# Patient Record
Sex: Female | Born: 1940 | Race: White | Hispanic: No | Smoking: Former smoker
Health system: Southern US, Community
[De-identification: ages and names within clinical notes are randomized; demographics above are authoritative.]

## PROBLEM LIST (undated history)

## (undated) DIAGNOSIS — K589 Irritable bowel syndrome without diarrhea: Secondary | ICD-10-CM

## (undated) DIAGNOSIS — R079 Chest pain, unspecified: Secondary | ICD-10-CM

## (undated) DIAGNOSIS — I2510 Atherosclerotic heart disease of native coronary artery without angina pectoris: Secondary | ICD-10-CM

## (undated) DIAGNOSIS — N189 Chronic kidney disease, unspecified: Secondary | ICD-10-CM

## (undated) DIAGNOSIS — R519 Headache, unspecified: Secondary | ICD-10-CM

## (undated) DIAGNOSIS — D863 Sarcoidosis of skin: Secondary | ICD-10-CM

## (undated) DIAGNOSIS — D509 Iron deficiency anemia, unspecified: Secondary | ICD-10-CM

## (undated) DIAGNOSIS — I219 Acute myocardial infarction, unspecified: Secondary | ICD-10-CM

## (undated) DIAGNOSIS — D519 Vitamin B12 deficiency anemia, unspecified: Secondary | ICD-10-CM

## (undated) DIAGNOSIS — K649 Unspecified hemorrhoids: Secondary | ICD-10-CM

## (undated) DIAGNOSIS — E1140 Type 2 diabetes mellitus with diabetic neuropathy, unspecified: Secondary | ICD-10-CM

## (undated) DIAGNOSIS — K635 Polyp of colon: Secondary | ICD-10-CM

## (undated) DIAGNOSIS — E119 Type 2 diabetes mellitus without complications: Secondary | ICD-10-CM

## (undated) DIAGNOSIS — M5090 Cervical disc disorder, unspecified, unspecified cervical region: Secondary | ICD-10-CM

## (undated) DIAGNOSIS — J189 Pneumonia, unspecified organism: Secondary | ICD-10-CM

## (undated) DIAGNOSIS — E785 Hyperlipidemia, unspecified: Secondary | ICD-10-CM

## (undated) DIAGNOSIS — I209 Angina pectoris, unspecified: Secondary | ICD-10-CM

## (undated) DIAGNOSIS — M1990 Unspecified osteoarthritis, unspecified site: Secondary | ICD-10-CM

## (undated) DIAGNOSIS — Z8639 Personal history of other endocrine, nutritional and metabolic disease: Secondary | ICD-10-CM

## (undated) DIAGNOSIS — K219 Gastro-esophageal reflux disease without esophagitis: Secondary | ICD-10-CM

## (undated) DIAGNOSIS — K8020 Calculus of gallbladder without cholecystitis without obstruction: Secondary | ICD-10-CM

## (undated) DIAGNOSIS — G4730 Sleep apnea, unspecified: Secondary | ICD-10-CM

## (undated) DIAGNOSIS — I10 Essential (primary) hypertension: Secondary | ICD-10-CM

## (undated) HISTORY — PX: ABDOMINAL HYSTERECTOMY: SHX81

## (undated) HISTORY — PX: ESOPHAGOGASTRODUODENOSCOPY: SHX1529

## (undated) HISTORY — PX: OOPHORECTOMY: SHX86

## (undated) HISTORY — PX: BACK SURGERY: SHX140

## (undated) HISTORY — PX: EXCISION OF ADNEXAL MASS: SHX5820

## (undated) HISTORY — PX: CORONARY ARTERY BYPASS GRAFT: SHX141

## (undated) HISTORY — PX: JOINT REPLACEMENT: SHX530

## (undated) HISTORY — PX: CARDIAC CATHETERIZATION: SHX172

## (undated) HISTORY — PX: CORONARY ANGIOPLASTY: SHX604

## (undated) HISTORY — PX: COLONOSCOPY: SHX174

## (undated) HISTORY — PX: OTHER SURGICAL HISTORY: SHX169

## (undated) HISTORY — PX: CHOLECYSTECTOMY: SHX55

---

## 2000-06-08 ENCOUNTER — Encounter: Payer: Self-pay | Admitting: Neurosurgery

## 2000-06-10 ENCOUNTER — Encounter: Payer: Self-pay | Admitting: Neurosurgery

## 2000-06-10 ENCOUNTER — Inpatient Hospital Stay (HOSPITAL_COMMUNITY): Admission: AD | Admit: 2000-06-10 | Discharge: 2000-06-13 | Payer: Self-pay | Admitting: Neurosurgery

## 2000-07-20 ENCOUNTER — Encounter: Payer: Self-pay | Admitting: Neurosurgery

## 2000-07-20 ENCOUNTER — Encounter: Admission: RE | Admit: 2000-07-20 | Discharge: 2000-07-20 | Payer: Self-pay | Admitting: Neurosurgery

## 2000-08-18 ENCOUNTER — Encounter: Admission: RE | Admit: 2000-08-18 | Discharge: 2000-08-18 | Payer: Self-pay | Admitting: Neurosurgery

## 2000-08-18 ENCOUNTER — Encounter: Payer: Self-pay | Admitting: Neurosurgery

## 2000-09-06 ENCOUNTER — Ambulatory Visit (HOSPITAL_COMMUNITY): Admission: RE | Admit: 2000-09-06 | Discharge: 2000-09-06 | Payer: Self-pay | Admitting: Neurosurgery

## 2000-09-06 ENCOUNTER — Encounter: Payer: Self-pay | Admitting: Neurosurgery

## 2000-11-23 ENCOUNTER — Encounter: Payer: Self-pay | Admitting: Neurosurgery

## 2000-11-23 ENCOUNTER — Encounter: Admission: RE | Admit: 2000-11-23 | Discharge: 2000-11-23 | Payer: Self-pay | Admitting: Neurosurgery

## 2001-06-07 ENCOUNTER — Encounter: Payer: Self-pay | Admitting: Neurosurgery

## 2001-06-07 ENCOUNTER — Encounter: Admission: RE | Admit: 2001-06-07 | Discharge: 2001-06-07 | Payer: Self-pay | Admitting: Neurosurgery

## 2002-07-11 ENCOUNTER — Encounter: Admission: RE | Admit: 2002-07-11 | Discharge: 2002-07-11 | Payer: Self-pay | Admitting: Orthopedic Surgery

## 2002-07-11 ENCOUNTER — Encounter: Payer: Self-pay | Admitting: Orthopedic Surgery

## 2003-11-17 ENCOUNTER — Encounter: Admission: RE | Admit: 2003-11-17 | Discharge: 2003-11-17 | Payer: Self-pay | Admitting: Unknown Physician Specialty

## 2004-03-05 ENCOUNTER — Ambulatory Visit: Payer: Self-pay | Admitting: Unknown Physician Specialty

## 2005-04-08 ENCOUNTER — Ambulatory Visit: Payer: Self-pay | Admitting: Internal Medicine

## 2005-08-06 ENCOUNTER — Ambulatory Visit: Payer: Self-pay | Admitting: Cardiology

## 2005-08-25 ENCOUNTER — Other Ambulatory Visit: Payer: Self-pay

## 2005-08-25 ENCOUNTER — Emergency Department: Payer: Self-pay | Admitting: Emergency Medicine

## 2005-08-27 ENCOUNTER — Emergency Department: Payer: Self-pay | Admitting: Emergency Medicine

## 2005-08-28 ENCOUNTER — Other Ambulatory Visit: Payer: Self-pay

## 2005-08-28 ENCOUNTER — Inpatient Hospital Stay: Payer: Self-pay | Admitting: Internal Medicine

## 2005-11-27 ENCOUNTER — Ambulatory Visit: Payer: Self-pay | Admitting: Gastroenterology

## 2006-04-13 ENCOUNTER — Other Ambulatory Visit: Payer: Self-pay

## 2006-04-20 ENCOUNTER — Inpatient Hospital Stay: Payer: Self-pay | Admitting: Unknown Physician Specialty

## 2006-09-06 ENCOUNTER — Ambulatory Visit: Payer: Self-pay | Admitting: Physician Assistant

## 2007-01-28 ENCOUNTER — Ambulatory Visit: Payer: Self-pay | Admitting: Cardiology

## 2007-02-04 ENCOUNTER — Ambulatory Visit: Payer: Self-pay | Admitting: Cardiology

## 2007-04-08 ENCOUNTER — Ambulatory Visit: Payer: Self-pay | Admitting: Internal Medicine

## 2008-01-04 ENCOUNTER — Ambulatory Visit: Payer: Self-pay | Admitting: Gastroenterology

## 2008-05-22 ENCOUNTER — Ambulatory Visit: Payer: Self-pay | Admitting: Internal Medicine

## 2009-05-30 ENCOUNTER — Ambulatory Visit: Payer: Self-pay | Admitting: Internal Medicine

## 2010-03-19 ENCOUNTER — Ambulatory Visit: Payer: Self-pay | Admitting: Internal Medicine

## 2010-03-30 ENCOUNTER — Ambulatory Visit: Payer: Self-pay | Admitting: Internal Medicine

## 2010-06-02 ENCOUNTER — Ambulatory Visit: Payer: Self-pay | Admitting: Internal Medicine

## 2010-07-01 ENCOUNTER — Ambulatory Visit: Payer: Self-pay | Admitting: Unknown Physician Specialty

## 2010-07-03 ENCOUNTER — Emergency Department: Payer: Self-pay

## 2010-07-18 ENCOUNTER — Ambulatory Visit: Payer: Self-pay | Admitting: Unknown Physician Specialty

## 2011-01-27 ENCOUNTER — Encounter: Payer: Self-pay | Admitting: Internal Medicine

## 2011-01-28 ENCOUNTER — Ambulatory Visit: Payer: Self-pay | Admitting: Internal Medicine

## 2011-02-09 ENCOUNTER — Encounter: Payer: Self-pay | Admitting: Internal Medicine

## 2011-03-12 ENCOUNTER — Encounter: Payer: Self-pay | Admitting: Internal Medicine

## 2011-04-11 ENCOUNTER — Encounter: Payer: Self-pay | Admitting: Internal Medicine

## 2011-05-12 ENCOUNTER — Encounter: Payer: Self-pay | Admitting: Internal Medicine

## 2011-06-30 ENCOUNTER — Ambulatory Visit: Payer: Self-pay | Admitting: Internal Medicine

## 2011-11-19 ENCOUNTER — Ambulatory Visit: Payer: Self-pay | Admitting: Podiatry

## 2012-06-01 ENCOUNTER — Ambulatory Visit: Payer: Self-pay | Admitting: Gastroenterology

## 2012-07-07 ENCOUNTER — Ambulatory Visit: Payer: Self-pay | Admitting: Cardiothoracic Surgery

## 2012-07-09 ENCOUNTER — Ambulatory Visit: Payer: Self-pay | Admitting: Cardiothoracic Surgery

## 2012-07-13 ENCOUNTER — Ambulatory Visit: Payer: Self-pay | Admitting: Internal Medicine

## 2013-01-04 ENCOUNTER — Ambulatory Visit: Payer: Self-pay | Admitting: Cardiothoracic Surgery

## 2013-01-05 ENCOUNTER — Ambulatory Visit: Payer: Self-pay | Admitting: Cardiothoracic Surgery

## 2013-05-25 ENCOUNTER — Ambulatory Visit: Payer: Self-pay | Admitting: Cardiothoracic Surgery

## 2013-06-11 ENCOUNTER — Ambulatory Visit: Payer: Self-pay | Admitting: Cardiothoracic Surgery

## 2013-07-20 ENCOUNTER — Ambulatory Visit: Payer: Self-pay | Admitting: Internal Medicine

## 2013-08-05 DIAGNOSIS — M5137 Other intervertebral disc degeneration, lumbosacral region: Secondary | ICD-10-CM | POA: Insufficient documentation

## 2013-08-05 DIAGNOSIS — R519 Headache, unspecified: Secondary | ICD-10-CM | POA: Insufficient documentation

## 2013-08-05 DIAGNOSIS — M51379 Other intervertebral disc degeneration, lumbosacral region without mention of lumbar back pain or lower extremity pain: Secondary | ICD-10-CM | POA: Insufficient documentation

## 2013-08-05 DIAGNOSIS — I25810 Atherosclerosis of coronary artery bypass graft(s) without angina pectoris: Secondary | ICD-10-CM | POA: Insufficient documentation

## 2013-11-28 DIAGNOSIS — E279 Disorder of adrenal gland, unspecified: Secondary | ICD-10-CM | POA: Insufficient documentation

## 2013-11-28 DIAGNOSIS — E538 Deficiency of other specified B group vitamins: Secondary | ICD-10-CM | POA: Insufficient documentation

## 2013-11-28 DIAGNOSIS — E278 Other specified disorders of adrenal gland: Secondary | ICD-10-CM | POA: Insufficient documentation

## 2014-03-04 DIAGNOSIS — E1122 Type 2 diabetes mellitus with diabetic chronic kidney disease: Secondary | ICD-10-CM | POA: Insufficient documentation

## 2014-04-09 DIAGNOSIS — E1169 Type 2 diabetes mellitus with other specified complication: Secondary | ICD-10-CM | POA: Insufficient documentation

## 2014-04-09 DIAGNOSIS — E785 Hyperlipidemia, unspecified: Secondary | ICD-10-CM | POA: Insufficient documentation

## 2014-07-26 ENCOUNTER — Ambulatory Visit: Payer: Self-pay | Admitting: Internal Medicine

## 2014-10-09 ENCOUNTER — Other Ambulatory Visit: Payer: Self-pay | Admitting: Nurse Practitioner

## 2014-10-09 DIAGNOSIS — K2100 Gastro-esophageal reflux disease with esophagitis, without bleeding: Secondary | ICD-10-CM

## 2014-10-09 DIAGNOSIS — K3184 Gastroparesis: Secondary | ICD-10-CM

## 2014-10-09 DIAGNOSIS — K449 Diaphragmatic hernia without obstruction or gangrene: Secondary | ICD-10-CM

## 2014-10-09 DIAGNOSIS — R1013 Epigastric pain: Secondary | ICD-10-CM

## 2014-10-17 ENCOUNTER — Ambulatory Visit
Admission: RE | Admit: 2014-10-17 | Discharge: 2014-10-17 | Disposition: A | Discharge status: Home / Self Care | Payer: Medicare Other | Source: Ambulatory Visit | Attending: Nurse Practitioner | Admitting: Nurse Practitioner

## 2014-10-17 DIAGNOSIS — K219 Gastro-esophageal reflux disease without esophagitis: Secondary | ICD-10-CM | POA: Insufficient documentation

## 2014-10-17 DIAGNOSIS — K2100 Gastro-esophageal reflux disease with esophagitis, without bleeding: Secondary | ICD-10-CM

## 2014-10-17 DIAGNOSIS — K449 Diaphragmatic hernia without obstruction or gangrene: Secondary | ICD-10-CM | POA: Insufficient documentation

## 2014-10-17 DIAGNOSIS — K3184 Gastroparesis: Secondary | ICD-10-CM

## 2014-10-17 DIAGNOSIS — R1013 Epigastric pain: Secondary | ICD-10-CM

## 2015-07-02 ENCOUNTER — Other Ambulatory Visit: Payer: Self-pay | Admitting: Obstetrics and Gynecology

## 2015-07-02 DIAGNOSIS — Z1231 Encounter for screening mammogram for malignant neoplasm of breast: Secondary | ICD-10-CM

## 2015-07-30 ENCOUNTER — Ambulatory Visit: Payer: Medicare Other

## 2015-08-01 ENCOUNTER — Ambulatory Visit
Admission: RE | Admit: 2015-08-01 | Discharge: 2015-08-01 | Disposition: A | Discharge status: Home / Self Care | Payer: Medicare Other | Source: Ambulatory Visit | Attending: Obstetrics and Gynecology | Admitting: Obstetrics and Gynecology

## 2015-08-01 ENCOUNTER — Other Ambulatory Visit: Payer: Self-pay | Admitting: Obstetrics and Gynecology

## 2015-08-01 DIAGNOSIS — Z1231 Encounter for screening mammogram for malignant neoplasm of breast: Secondary | ICD-10-CM | POA: Diagnosis not present

## 2016-06-16 ENCOUNTER — Other Ambulatory Visit: Payer: Self-pay | Admitting: Internal Medicine

## 2016-06-16 DIAGNOSIS — Z1231 Encounter for screening mammogram for malignant neoplasm of breast: Secondary | ICD-10-CM

## 2016-08-06 ENCOUNTER — Ambulatory Visit
Admission: RE | Admit: 2016-08-06 | Discharge: 2016-08-06 | Disposition: A | Discharge status: Home / Self Care | Payer: Medicare Other | Source: Ambulatory Visit | Attending: Internal Medicine | Admitting: Internal Medicine

## 2016-08-06 DIAGNOSIS — Z1231 Encounter for screening mammogram for malignant neoplasm of breast: Secondary | ICD-10-CM

## 2017-05-26 ENCOUNTER — Other Ambulatory Visit: Payer: Self-pay | Admitting: Cardiology

## 2017-05-26 ENCOUNTER — Ambulatory Visit
Admission: RE | Admit: 2017-05-26 | Discharge: 2017-05-26 | Disposition: A | Discharge status: Home / Self Care | Payer: Medicare Other | Source: Ambulatory Visit | Attending: Cardiology | Admitting: Cardiology

## 2017-05-26 DIAGNOSIS — M79604 Pain in right leg: Secondary | ICD-10-CM | POA: Insufficient documentation

## 2017-07-26 ENCOUNTER — Other Ambulatory Visit: Payer: Self-pay | Admitting: Internal Medicine

## 2017-07-26 DIAGNOSIS — Z1231 Encounter for screening mammogram for malignant neoplasm of breast: Secondary | ICD-10-CM

## 2017-09-14 ENCOUNTER — Ambulatory Visit
Admission: RE | Admit: 2017-09-14 | Discharge: 2017-09-14 | Disposition: A | Discharge status: Home / Self Care | Payer: Medicare Other | Source: Ambulatory Visit | Attending: Internal Medicine | Admitting: Internal Medicine

## 2017-09-14 DIAGNOSIS — Z1231 Encounter for screening mammogram for malignant neoplasm of breast: Secondary | ICD-10-CM | POA: Diagnosis not present

## 2017-11-26 ENCOUNTER — Encounter: Payer: Self-pay | Admitting: *Deleted

## 2017-11-29 ENCOUNTER — Ambulatory Visit: Payer: Medicare Other | Admitting: Certified Registered Nurse Anesthetist

## 2017-11-29 ENCOUNTER — Encounter: Payer: Self-pay | Admitting: Certified Registered Nurse Anesthetist

## 2017-11-29 ENCOUNTER — Ambulatory Visit
Admission: RE | Admit: 2017-11-29 | Discharge: 2017-11-29 | Disposition: A | Discharge status: Home / Self Care | Payer: Medicare Other | Source: Ambulatory Visit | Attending: Gastroenterology | Admitting: Gastroenterology

## 2017-11-29 ENCOUNTER — Encounter
Admission: RE | Disposition: A | Discharge status: Home / Self Care | Payer: Self-pay | Source: Ambulatory Visit | Attending: Gastroenterology

## 2017-11-29 DIAGNOSIS — K219 Gastro-esophageal reflux disease without esophagitis: Secondary | ICD-10-CM | POA: Insufficient documentation

## 2017-11-29 DIAGNOSIS — E119 Type 2 diabetes mellitus without complications: Secondary | ICD-10-CM | POA: Diagnosis not present

## 2017-11-29 DIAGNOSIS — Z881 Allergy status to other antibiotic agents status: Secondary | ICD-10-CM | POA: Insufficient documentation

## 2017-11-29 DIAGNOSIS — K259 Gastric ulcer, unspecified as acute or chronic, without hemorrhage or perforation: Secondary | ICD-10-CM | POA: Diagnosis not present

## 2017-11-29 DIAGNOSIS — D509 Iron deficiency anemia, unspecified: Secondary | ICD-10-CM | POA: Diagnosis present

## 2017-11-29 DIAGNOSIS — M1990 Unspecified osteoarthritis, unspecified site: Secondary | ICD-10-CM | POA: Diagnosis not present

## 2017-11-29 DIAGNOSIS — G4730 Sleep apnea, unspecified: Secondary | ICD-10-CM | POA: Insufficient documentation

## 2017-11-29 DIAGNOSIS — K589 Irritable bowel syndrome without diarrhea: Secondary | ICD-10-CM | POA: Diagnosis not present

## 2017-11-29 DIAGNOSIS — K2950 Unspecified chronic gastritis without bleeding: Secondary | ICD-10-CM | POA: Diagnosis not present

## 2017-11-29 DIAGNOSIS — Z7982 Long term (current) use of aspirin: Secondary | ICD-10-CM | POA: Diagnosis not present

## 2017-11-29 DIAGNOSIS — Z882 Allergy status to sulfonamides status: Secondary | ICD-10-CM | POA: Diagnosis not present

## 2017-11-29 DIAGNOSIS — Z888 Allergy status to other drugs, medicaments and biological substances status: Secondary | ICD-10-CM | POA: Diagnosis not present

## 2017-11-29 DIAGNOSIS — I2510 Atherosclerotic heart disease of native coronary artery without angina pectoris: Secondary | ICD-10-CM | POA: Diagnosis not present

## 2017-11-29 DIAGNOSIS — Z794 Long term (current) use of insulin: Secondary | ICD-10-CM | POA: Diagnosis not present

## 2017-11-29 DIAGNOSIS — Z885 Allergy status to narcotic agent status: Secondary | ICD-10-CM | POA: Insufficient documentation

## 2017-11-29 DIAGNOSIS — K317 Polyp of stomach and duodenum: Secondary | ICD-10-CM | POA: Diagnosis not present

## 2017-11-29 DIAGNOSIS — I252 Old myocardial infarction: Secondary | ICD-10-CM | POA: Insufficient documentation

## 2017-11-29 DIAGNOSIS — Z79899 Other long term (current) drug therapy: Secondary | ICD-10-CM | POA: Insufficient documentation

## 2017-11-29 HISTORY — DX: Essential (primary) hypertension: I10

## 2017-11-29 HISTORY — DX: Calculus of gallbladder without cholecystitis without obstruction: K80.20

## 2017-11-29 HISTORY — DX: Type 2 diabetes mellitus without complications: E11.9

## 2017-11-29 HISTORY — DX: Unspecified hemorrhoids: K64.9

## 2017-11-29 HISTORY — DX: Gastro-esophageal reflux disease without esophagitis: K21.9

## 2017-11-29 HISTORY — DX: Cervical disc disorder, unspecified, unspecified cervical region: M50.90

## 2017-11-29 HISTORY — DX: Chest pain, unspecified: R07.9

## 2017-11-29 HISTORY — DX: Sleep apnea, unspecified: G47.30

## 2017-11-29 HISTORY — DX: Atherosclerotic heart disease of native coronary artery without angina pectoris: I25.10

## 2017-11-29 HISTORY — DX: Unspecified osteoarthritis, unspecified site: M19.90

## 2017-11-29 HISTORY — DX: Acute myocardial infarction, unspecified: I21.9

## 2017-11-29 HISTORY — PX: COLONOSCOPY WITH PROPOFOL: SHX5780

## 2017-11-29 HISTORY — DX: Irritable bowel syndrome, unspecified: K58.9

## 2017-11-29 HISTORY — PX: ESOPHAGOGASTRODUODENOSCOPY (EGD) WITH PROPOFOL: SHX5813

## 2017-11-29 LAB — GLUCOSE, CAPILLARY: GLUCOSE-CAPILLARY: 180 mg/dL — AB (ref 70–99)

## 2017-11-29 SURGERY — COLONOSCOPY WITH PROPOFOL
Anesthesia: General

## 2017-11-29 MED ORDER — PROPOFOL 500 MG/50ML IV EMUL
INTRAVENOUS | Status: AC
  Filled 2017-11-29: qty 50

## 2017-11-29 MED ORDER — LIDOCAINE HCL (CARDIAC) PF 100 MG/5ML IV SOSY
PREFILLED_SYRINGE | INTRAVENOUS | Status: DC | PRN
  Administered 2017-11-29: 50 mg via INTRAVENOUS

## 2017-11-29 MED ORDER — LIDOCAINE HCL (PF) 2 % IJ SOLN
INTRAMUSCULAR | Status: AC
  Filled 2017-11-29: qty 10

## 2017-11-29 MED ORDER — ONDANSETRON HCL 4 MG/2ML IJ SOLN
4.0000 mg | Freq: Once | INTRAMUSCULAR | Status: AC | PRN
  Administered 2017-11-29: 4 mg via INTRAVENOUS

## 2017-11-29 MED ORDER — SODIUM CHLORIDE 0.9 % IV SOLN: INTRAVENOUS | Status: DC

## 2017-11-29 MED ORDER — FENTANYL CITRATE (PF) 100 MCG/2ML IJ SOLN: 25.0000 ug | INTRAMUSCULAR | Status: DC | PRN

## 2017-11-29 MED ORDER — ONDANSETRON HCL 4 MG/2ML IJ SOLN
INTRAMUSCULAR | Status: AC
  Filled 2017-11-29: qty 2

## 2017-11-29 MED ORDER — SODIUM CHLORIDE 0.9 % IV SOLN
INTRAVENOUS | Status: DC
  Administered 2017-11-29: 08:00:00 via INTRAVENOUS

## 2017-11-29 MED ORDER — PROPOFOL 500 MG/50ML IV EMUL
INTRAVENOUS | Status: DC | PRN
  Administered 2017-11-29: 140 ug/kg/min via INTRAVENOUS

## 2017-11-29 MED ORDER — PROPOFOL 10 MG/ML IV BOLUS
INTRAVENOUS | Status: DC | PRN
  Administered 2017-11-29: 70 mg via INTRAVENOUS

## 2017-11-29 NOTE — Anesthesia Post-op Follow-up Note (Signed)
Anesthesia QCDR form completed.        

## 2017-11-29 NOTE — Anesthesia Preprocedure Evaluation (Signed)
Anesthesia Evaluation  Patient identified by MRN, date of birth, ID band Patient awake    Airway Mallampati: II  TM Distance: >3 FB     Dental   Pulmonary sleep apnea , former smoker,    Pulmonary exam normal        Cardiovascular hypertension, + CAD and + Past MI  Normal cardiovascular exam     Neuro/Psych negative neurological ROS  negative psych ROS   GI/Hepatic Neg liver ROS, GERD  ,  Endo/Other  diabetes  Renal/GU negative Renal ROS  negative genitourinary   Musculoskeletal  (+) Arthritis , Osteoarthritis,    Abdominal Normal abdominal exam  (+)   Peds negative pediatric ROS (+)  Hematology negative hematology ROS (+)   Anesthesia Other Findings   Reproductive/Obstetrics                             Anesthesia Physical Anesthesia Plan  ASA: III  Anesthesia Plan: General   Post-op Pain Management:    Induction: Intravenous  PONV Risk Score and Plan: TIVA  Airway Management Planned:   Additional Equipment:   Intra-op Plan:   Post-operative Plan:   Informed Consent: I have reviewed the patients History and Physical, chart, labs and discussed the procedure including the risks, benefits and alternatives for the proposed anesthesia with the patient or authorized representative who has indicated his/her understanding and acceptance.     Plan Discussed with: CRNA and Surgeon  Anesthesia Plan Comments:         Anesthesia Quick Evaluation

## 2017-11-29 NOTE — Transfer of Care (Signed)
Immediate Anesthesia Transfer of Care Note  Patient: Rhonda Tyler  Procedure(s) Performed: COLONOSCOPY WITH PROPOFOL (N/A ) ESOPHAGOGASTRODUODENOSCOPY (EGD) WITH PROPOFOL (N/A )  Patient Location: PACU and Endoscopy Unit  Anesthesia Type:General  Level of Consciousness: drowsy  Airway & Oxygen Therapy: Patient Spontanous Breathing and Patient connected to face mask oxygen  Post-op Assessment: Report given to RN and Post -op Vital signs reviewed and stable  Post vital signs: Reviewed and stable  Last Vitals:  Vitals Value Taken Time  BP 110/48 11/29/2017  9:54 AM  Temp    Pulse 71 11/29/2017  9:54 AM  Resp 17 11/29/2017  9:54 AM  SpO2 98 % 11/29/2017  9:54 AM  Vitals shown include unvalidated device data.  Last Pain:  Vitals:   11/29/17 0952  TempSrc: (P) Tympanic  PainSc:          Complications: No apparent anesthesia complications

## 2017-11-29 NOTE — Op Note (Signed)
[facility] Gastroenterology Patient Name: Rhonda Tyler Procedure Date: 11/29/2017 8:20 AM MRN: [id number] Account #: [id number] Date of Birth: 04-06-1941 Admit Type: Outpatient Age: 76 Room: [facility] ENDO ROOM 3 Gender: Female Note Status: Finalized Procedure:            Colonoscopy Indications:          Iron deficiency anemia Providers:            Lollie Sails, MD Referring MD:         Ocie Cornfield. Ouida Sills MD, MD (Referring MD) Medicines:            Monitored Anesthesia Care Complications:        No immediate complications. Procedure:            Pre-Anesthesia Assessment:                       - ASA Grade Assessment: III - A patient with severe                        systemic disease.                       After obtaining informed consent, the colonoscope was                        passed under direct vision. Throughout the procedure,                        the patient's blood pressure, pulse, and oxygen                        saturations were monitored continuously. The                        Colonoscope was introduced through the anus and                        advanced to the the cecum, identified by appendiceal                        orifice and ileocecal valve. The colonoscopy was                        performed without difficulty. The patient tolerated the                        procedure well. The quality of the bowel preparation                        was good. Findings:      The colon (entire examined portion) appeared normal.      The retroflexed view of the distal rectum and anal verge was normal and       showed no anal or rectal abnormalities.      The digital rectal exam was normal. Pertinent negatives include Note       some mild masceration of the skin of the perianal region. Impression:           - The entire examined colon is normal.                       -  No specimens collected. Recommendation:       - Desitin ointment to affected  perianal areas as                        directed. Procedure Code(s):    --- Professional ---                       [phone], Colonoscopy, flexible; diagnostic, including                        collection of specimen(s) by brushing or washing, when                        performed (separate procedure) Diagnosis Code(s):    --- Professional ---                       D50.9, Iron deficiency anemia, unspecified CPT copyright 2017 American Medical Association. All rights reserved. The codes documented in this report are preliminary and upon coder review may  be revised to meet current compliance requirements. Lollie Sails, MD 11/29/2017 9:48:28 AM This report has been signed electronically. Number of Addenda: 0 Note Initiated On: 11/29/2017 8:20 AM Scope Withdrawal Time: 0 hours 3 minutes 48 seconds  Total Procedure Duration: 0 hours 9 minutes 15 seconds       [facility]

## 2017-11-29 NOTE — Op Note (Signed)
[facility] Gastroenterology Patient Name: Rhonda Tyler Procedure Date: 11/29/2017 8:21 AM MRN: [id number] Account #: [id number] Date of Birth: 11-25-1940 Admit Type: Outpatient Age: 77 Room: [facility] ENDO ROOM 3 Gender: Female Note Status: Finalized Procedure:            Upper GI endoscopy Indications:          Epigastric abdominal pain, Iron deficiency anemia Providers:            Lollie Sails, MD Referring MD:         Ocie Cornfield. Ouida Sills MD, MD (Referring MD) Medicines:            Monitored Anesthesia Care Complications:        No immediate complications. Procedure:            Pre-Anesthesia Assessment:                       - ASA Grade Assessment: III - A patient with severe                        systemic disease.                       After obtaining informed consent, the endoscope was                        passed under direct vision. Throughout the procedure,                        the patient's blood pressure, pulse, and oxygen                        saturations were monitored continuously. The Endoscope                        was introduced through the mouth, and advanced to the                        third part of duodenum. The upper GI endoscopy was                        performed with moderate difficulty. The patient                        tolerated the procedure well. Findings:      The examined esophagus was normal.      Multiple 1 to 28/30 mm pedunculated and sessile polyps with no bleeding       and stigmata of recent bleeding were found in the gastric body and in       the gastric antrum. The polyp was removed with a hot snare. The multiple       segments of the detached polyp was removed with a piecemeal technique       using a cold snare and suction. Resection and retrieval were complete.       There are multiple smallter polyps that are erythematous without       stigmata. To prevent bleeding after the polypectomy, one hemostatic clip     was successfully placed (MR conditional). There was no bleeding at the       end of the maneuver.      The cardia  and gastric fundus were normal on retroflexion otherwise.      The examined duodenum was normal. Impression:           - Normal esophagus.                       - Multiple gastric polyps. The largest with stigmata of                        bleeding resected and retrieved. Clip (MR conditional)                        was placed.                       - Normal examined duodenum. Recommendation:       - Await pathology results.                       - Repeat upper endoscopy in 3 months to check healing                        and for retreatment. Procedure Code(s):    --- Professional ---                       [phone], Esophagogastroduodenoscopy, flexible, transoral;                        with removal of tumor(s), polyp(s), or other lesion(s)                        by snare technique CPT copyright 2017 American Medical Association. All rights reserved. The codes documented in this report are preliminary and upon coder review may  be revised to meet current compliance requirements. Lollie Sails, MD 11/29/2017 9:32:27 AM This report has been signed electronically. Number of Addenda: 0 Note Initiated On: 11/29/2017 8:21 AM      [facility]

## 2017-11-29 NOTE — H&P (Signed)
Outpatient short stay form Pre-procedure 11/29/2017 7:58 AM Lollie Sails MD  Primary Physician: Frazier Richards, MD  Reason for visit: EGD and colonoscopy  History of present illness: Patient is a 77 year old female presenting today as above.  She has a history of iron deficiency anemia, and epigastric pain.  She is currently taking Reglan and has a history of gastric emptying delay first diagnosed in 2007.  She is diabetic 20 years.  Tolerating her prep well.  She does take a mini dose/81 mg aspirin daily but has held that for several days.  She takes no other aspirin products or blood thinning agent.    Current Facility-Administered Medications:  .  0.9 %  sodium chloride infusion, , Intravenous, Continuous, Lollie Sails, MD .  0.9 %  sodium chloride infusion, , Intravenous, Continuous, Lollie Sails, MD  Medications Prior to Admission  Medication Sig Dispense Refill Last Dose  . acetaminophen (TYLENOL) 500 MG tablet Take 500 mg by mouth every 4 (four) hours as needed.   Past Week at Unknown time  . allopurinol (ZYLOPRIM) 100 MG tablet Take 100 mg by mouth daily.   11/28/2017 at Unknown time  . aspirin EC 81 MG tablet Take 81 mg by mouth daily.   Past Month at Unknown time  . calcium carbonate (OS-CAL) 600 MG TABS tablet Take 600 mg by mouth daily.   Past Week at Unknown time  . estradiol (ESTRACE) 0.1 MG/GM vaginal cream Place 1 Applicatorful vaginally at bedtime.   Past Month at Unknown time  . fenofibrate 160 MG tablet Take 160 mg by mouth daily.   11/28/2017 at Unknown time  . ferrous sulfate 325 (65 FE) MG tablet Take 325 mg by mouth daily with breakfast.   Past Week at Unknown time  . insulin NPH-regular Human (NOVOLIN 70/30) (70-30) 100 UNIT/ML injection Inject 45 Units into the skin daily with breakfast. And 40 units at night   11/29/2017 at Unknown time  . insulin regular (NOVOLIN R,HUMULIN R) 100 units/mL injection Inject 4-10 Units into the skin daily before  lunch.   Past Week at Unknown time  . lisinopril (PRINIVIL,ZESTRIL) 40 MG tablet Take 40 mg by mouth daily.   11/29/2017 at Unknown time  . loratadine (CLARITIN) 10 MG tablet Take 10 mg by mouth daily.   11/29/2017 at Unknown time  . metFORMIN (GLUCOPHAGE-XR) 500 MG 24 hr tablet Take 500 mg by mouth daily with breakfast. Take 2 tablets two times a day   Past Week at Unknown time  . metoCLOPramide (REGLAN) 10 MG tablet Take 10 mg by mouth 3 (three) times daily before meals.   11/28/2017 at Unknown time  . pantoprazole (PROTONIX) 40 MG tablet Take 40 mg by mouth 2 (two) times daily.   11/28/2017 at Unknown time  . potassium chloride SA (K-DUR,KLOR-CON) 20 MEQ tablet Take 20 mEq by mouth daily.   11/28/2017 at Unknown time  . propranolol (INDERAL) 40 MG tablet Take 40 mg by mouth daily.   11/29/2017 at Unknown time  . torsemide (DEMADEX) 10 MG tablet Take 10 mg by mouth daily.   11/28/2017 at Unknown time  . vitamin B-12 (CYANOCOBALAMIN) 100 MCG tablet Take by mouth daily.   Past Week at met     Allergies  Allergen Reactions  . Allegra [Fexofenadine]   . Codeine   . Levaquin [Levofloxacin In D5w]   . Lipitor [Atorvastatin Calcium]   . Lopressor [Metoprolol Tartrate] Other (See Comments)    HEART RACES  .  Potassium-Containing Compounds   . Procardia [Nifedipine]   . Sucralfate   . Sulfa Antibiotics   . Tramadol      Past Medical History:  Diagnosis Date  . Arthritis   . Cervical disc disease   . Chest pain   . Cholecystolithiasis   . Coronary artery disease   . Diabetes mellitus without complication ([place])   . GERD (gastroesophageal reflux disease)   . Hemorrhoid   . Hypertension   . IBS (irritable bowel syndrome)   . Myocardial infarction ([place])   . Sleep apnea     Review of systems:      Physical Exam    Heart and lungs: Regular rate and rhythm without rub or gallop, lungs are bilaterally clear.    HEENT: Normocephalic atraumatic eyes are anicteric    Other:    Pertinant  exam for procedure: Soft mild tenderness to palpation in the right lower quadrant.  Bowel sounds are positive normoactive.  There are no masses or rebound.    Planned proceedures: EGD, colonoscopy and indicated procedures. I have discussed the risks benefits and complications of procedures to include not limited to bleeding, infection, perforation and the risk of sedation and the patient wishes to proceed.    Lollie Sails, MD Gastroenterology 11/29/2017  7:58 AM

## 2017-11-30 ENCOUNTER — Encounter: Payer: Self-pay | Admitting: Gastroenterology

## 2017-11-30 LAB — SURGICAL PATHOLOGY

## 2017-11-30 NOTE — Anesthesia Postprocedure Evaluation (Signed)
Anesthesia Post Note  Patient: Rhonda Tyler  Procedure(s) Performed: COLONOSCOPY WITH PROPOFOL (N/A ) ESOPHAGOGASTRODUODENOSCOPY (EGD) WITH PROPOFOL (N/A )  Patient location during evaluation: PACU Anesthesia Type: General Level of consciousness: awake and alert and oriented Pain management: pain level controlled Vital Signs Assessment: post-procedure vital signs reviewed and stable Respiratory status: spontaneous breathing Cardiovascular status: blood pressure returned to baseline Anesthetic complications: no     Last Vitals:  Vitals:   11/29/17 0750 11/29/17 0952  BP: (!) 194/69   Pulse: 70 80  Resp: 18 20  Temp: (!) 36.4 C (!) 36.1 C  SpO2: 100%     Last Pain:  Vitals:   11/29/17 1143  TempSrc:   PainSc: 0-No pain                 Kailyn Vanderslice

## 2018-03-07 ENCOUNTER — Ambulatory Visit
Admission: RE | Admit: 2018-03-07 | Discharge: 2018-03-07 | Disposition: A | Discharge status: Home / Self Care | Payer: Medicare Other | Source: Ambulatory Visit | Attending: Gastroenterology | Admitting: Gastroenterology

## 2018-03-07 ENCOUNTER — Encounter: Payer: Self-pay | Admitting: Anesthesiology

## 2018-03-07 ENCOUNTER — Encounter
Admission: RE | Disposition: A | Discharge status: Home / Self Care | Payer: Self-pay | Source: Ambulatory Visit | Attending: Gastroenterology

## 2018-03-07 ENCOUNTER — Ambulatory Visit: Payer: Medicare Other | Admitting: Anesthesiology

## 2018-03-07 DIAGNOSIS — Z79890 Hormone replacement therapy (postmenopausal): Secondary | ICD-10-CM | POA: Diagnosis not present

## 2018-03-07 DIAGNOSIS — Z09 Encounter for follow-up examination after completed treatment for conditions other than malignant neoplasm: Secondary | ICD-10-CM | POA: Diagnosis present

## 2018-03-07 DIAGNOSIS — Z96652 Presence of left artificial knee joint: Secondary | ICD-10-CM | POA: Diagnosis not present

## 2018-03-07 DIAGNOSIS — K219 Gastro-esophageal reflux disease without esophagitis: Secondary | ICD-10-CM | POA: Diagnosis not present

## 2018-03-07 DIAGNOSIS — Z87891 Personal history of nicotine dependence: Secondary | ICD-10-CM | POA: Insufficient documentation

## 2018-03-07 DIAGNOSIS — I2510 Atherosclerotic heart disease of native coronary artery without angina pectoris: Secondary | ICD-10-CM | POA: Diagnosis not present

## 2018-03-07 DIAGNOSIS — Z794 Long term (current) use of insulin: Secondary | ICD-10-CM | POA: Diagnosis not present

## 2018-03-07 DIAGNOSIS — Z7982 Long term (current) use of aspirin: Secondary | ICD-10-CM | POA: Diagnosis not present

## 2018-03-07 DIAGNOSIS — K259 Gastric ulcer, unspecified as acute or chronic, without hemorrhage or perforation: Secondary | ICD-10-CM | POA: Insufficient documentation

## 2018-03-07 DIAGNOSIS — Z79899 Other long term (current) drug therapy: Secondary | ICD-10-CM | POA: Diagnosis not present

## 2018-03-07 DIAGNOSIS — Z9989 Dependence on other enabling machines and devices: Secondary | ICD-10-CM | POA: Diagnosis not present

## 2018-03-07 DIAGNOSIS — Z951 Presence of aortocoronary bypass graft: Secondary | ICD-10-CM | POA: Insufficient documentation

## 2018-03-07 DIAGNOSIS — I10 Essential (primary) hypertension: Secondary | ICD-10-CM | POA: Insufficient documentation

## 2018-03-07 DIAGNOSIS — E785 Hyperlipidemia, unspecified: Secondary | ICD-10-CM | POA: Diagnosis not present

## 2018-03-07 DIAGNOSIS — K317 Polyp of stomach and duodenum: Secondary | ICD-10-CM | POA: Insufficient documentation

## 2018-03-07 DIAGNOSIS — D509 Iron deficiency anemia, unspecified: Secondary | ICD-10-CM | POA: Diagnosis not present

## 2018-03-07 DIAGNOSIS — E1140 Type 2 diabetes mellitus with diabetic neuropathy, unspecified: Secondary | ICD-10-CM | POA: Diagnosis not present

## 2018-03-07 DIAGNOSIS — I252 Old myocardial infarction: Secondary | ICD-10-CM | POA: Insufficient documentation

## 2018-03-07 DIAGNOSIS — G4730 Sleep apnea, unspecified: Secondary | ICD-10-CM | POA: Insufficient documentation

## 2018-03-07 HISTORY — DX: Hyperlipidemia, unspecified: E78.5

## 2018-03-07 HISTORY — DX: Personal history of other endocrine, nutritional and metabolic disease: Z86.39

## 2018-03-07 HISTORY — PX: ESOPHAGOGASTRODUODENOSCOPY (EGD) WITH PROPOFOL: SHX5813

## 2018-03-07 HISTORY — DX: Polyp of colon: K63.5

## 2018-03-07 LAB — GLUCOSE, CAPILLARY
Glucose-Capillary: 143 mg/dL — ABNORMAL HIGH (ref 70–99)
Glucose-Capillary: 129 mg/dL — ABNORMAL HIGH (ref 70–99)

## 2018-03-07 SURGERY — ESOPHAGOGASTRODUODENOSCOPY (EGD) WITH PROPOFOL
Anesthesia: General

## 2018-03-07 MED ORDER — FENTANYL CITRATE (PF) 100 MCG/2ML IJ SOLN
INTRAMUSCULAR | Status: DC | PRN
  Administered 2018-03-07 (×2): 50 ug via INTRAVENOUS

## 2018-03-07 MED ORDER — FENTANYL CITRATE (PF) 100 MCG/2ML IJ SOLN
INTRAMUSCULAR | Status: AC
  Filled 2018-03-07: qty 2

## 2018-03-07 MED ORDER — GLYCOPYRROLATE 0.2 MG/ML IJ SOLN
INTRAMUSCULAR | Status: AC
  Filled 2018-03-07: qty 1

## 2018-03-07 MED ORDER — PROPOFOL 10 MG/ML IV BOLUS
INTRAVENOUS | Status: AC
  Filled 2018-03-07: qty 20

## 2018-03-07 MED ORDER — PHENYLEPHRINE HCL 10 MG/ML IJ SOLN
INTRAMUSCULAR | Status: AC
  Filled 2018-03-07: qty 1

## 2018-03-07 MED ORDER — PROPOFOL 10 MG/ML IV BOLUS
INTRAVENOUS | Status: DC | PRN
  Administered 2018-03-07: 100 mg via INTRAVENOUS

## 2018-03-07 MED ORDER — PROPOFOL 500 MG/50ML IV EMUL
INTRAVENOUS | Status: AC
  Filled 2018-03-07: qty 50

## 2018-03-07 MED ORDER — SODIUM CHLORIDE 0.9 % IV SOLN
INTRAVENOUS | Status: DC
  Administered 2018-03-07: 1000 mL via INTRAVENOUS

## 2018-03-07 MED ORDER — LIDOCAINE HCL (PF) 2 % IJ SOLN
INTRAMUSCULAR | Status: AC
  Filled 2018-03-07: qty 10

## 2018-03-07 MED ORDER — LIDOCAINE 2% (20 MG/ML) 5 ML SYRINGE
INTRAMUSCULAR | Status: DC | PRN
  Administered 2018-03-07: 30 mg via INTRAVENOUS

## 2018-03-07 MED ORDER — PROPOFOL 500 MG/50ML IV EMUL
INTRAVENOUS | Status: DC | PRN
  Administered 2018-03-07: 180 ug/kg/min via INTRAVENOUS

## 2018-03-07 MED ORDER — EPHEDRINE SULFATE 50 MG/ML IJ SOLN
INTRAMUSCULAR | Status: AC
  Filled 2018-03-07: qty 1

## 2018-03-07 NOTE — Anesthesia Post-op Follow-up Note (Signed)
Anesthesia QCDR form completed.        

## 2018-03-07 NOTE — H&P (Signed)
Outpatient short stay form Pre-procedure 03/07/2018 7:24 AM Rhonda Sails MD  Primary Physician: Frazier Richards MD  Reason for visit: EGD  History of present illness: Patient is a 77 year old female presenting today for an EGD.  She has a history of having some large gastric polyps which showed evidence of ulceration and bleeding and patient has a history of microcytic/iron deficient anemia.  She is returning today for repeat check.  Patient is on both magnesium and iron supplementation.  She takes a daily 81 mg aspirin but has not taken that for about 3 days.  She takes no other aspirin products or blood thinning agent.  She is taking a twice daily PPI however did not tolerate taking Carafate.     Current Facility-Administered Medications:  .  0.9 %  sodium chloride infusion, , Intravenous, Continuous, Rhonda Sails, MD, Last Rate: 20 mL/hr at 03/07/18 0702  Medications Prior to Admission  Medication Sig Dispense Refill Last Dose  . acetaminophen (TYLENOL) 500 MG tablet Take 500 mg by mouth every 4 (four) hours as needed.   Past Week at Unknown time  . allopurinol (ZYLOPRIM) 100 MG tablet Take 100 mg by mouth daily.   03/06/2018 at Unknown time  . aspirin EC 81 MG tablet Take 81 mg by mouth daily.   Past Week at Unknown time  . calcium carbonate (OS-CAL) 600 MG TABS tablet Take 600 mg by mouth daily.   Past Week at Unknown time  . estradiol (ESTRACE) 0.1 MG/GM vaginal cream Place 1 Applicatorful vaginally at bedtime.   Past Month at Unknown time  . fenofibrate 160 MG tablet Take 160 mg by mouth daily.   03/06/2018 at Unknown time  . ferrous sulfate 325 (65 FE) MG tablet Take 325 mg by mouth daily with breakfast.   Past Week at Unknown time  . insulin NPH-regular Human (NOVOLIN 70/30) (70-30) 100 UNIT/ML injection Inject 45 Units into the skin daily with breakfast. And 40 units at night   03/06/2018 at Unknown time  . insulin regular (NOVOLIN R,HUMULIN R) 100 units/mL injection  Inject 4-10 Units into the skin daily before lunch.   03/06/2018 at Unknown time  . lisinopril (PRINIVIL,ZESTRIL) 40 MG tablet Take 40 mg by mouth daily.   03/07/2018 at 0500  . loratadine (CLARITIN) 10 MG tablet Take 10 mg by mouth daily.   03/07/2018 at 0500  . magnesium oxide (MAG-OX) 400 MG tablet Take 400 mg by mouth daily.   Past Week at Unknown time  . metFORMIN (GLUCOPHAGE-XR) 500 MG 24 hr tablet Take 500 mg by mouth daily with breakfast. Take 2 tablets two times a day   03/06/2018 at Unknown time  . metoCLOPramide (REGLAN) 10 MG tablet Take 10 mg by mouth 3 (three) times daily before meals.   03/06/2018 at Unknown time  . pantoprazole (PROTONIX) 40 MG tablet Take 40 mg by mouth 2 (two) times daily.   03/06/2018 at Unknown time  . potassium chloride SA (K-DUR,KLOR-CON) 20 MEQ tablet Take 20 mEq by mouth daily.   Past Week at Unknown time  . propranolol (INDERAL) 40 MG tablet Take 40 mg by mouth daily.   03/07/2018 at 0500  . sucralfate (CARAFATE) 1 g tablet Take 1 g by mouth 4 (four) times daily -  with meals and at bedtime.   03/06/2018 at Unknown time  . torsemide (DEMADEX) 10 MG tablet Take 10 mg by mouth daily.   03/06/2018 at Unknown time  . vitamin B-12 (CYANOCOBALAMIN) 100 MCG  tablet Take by mouth daily.   Past Month at Unknown time     Allergies  Allergen Reactions  . Allegra [Fexofenadine]   . Codeine   . Levaquin [Levofloxacin In D5w]   . Lipitor [Atorvastatin Calcium]   . Lopressor [Metoprolol Tartrate] Other (See Comments)    HEART RACES  . Penicillins   . Potassium-Containing Compounds   . Procardia [Nifedipine]   . Sucralfate   . Sulfa Antibiotics   . Tramadol      Past Medical History:  Diagnosis Date  . Arthritis   . Cervical disc disease   . Cervical disc disease   . Chest pain   . Cholecystolithiasis   . Colon polyp   . Coronary artery disease   . Diabetes mellitus without complication ([place])   . GERD (gastroesophageal reflux disease)   . Hemorrhoid    . History of diabetic neuropathy   . Hyperlipemia   . Hypertension   . IBS (irritable bowel syndrome)   . Myocardial infarction ([place])   . Sleep apnea     Review of systems:      Physical Exam    Heart and lungs: Regular rate and rhythm without rub or gallop, lungs are bilaterally clear.    HEENT: Normocephalic atraumatic eyes are anicteric    Other:    Pertinant exam for procedure: Soft nontender nondistended bowel sounds positive normoactive.    Planned proceedures: EGD and indicated procedures. I have discussed the risks benefits and complications of procedures to include not limited to bleeding, infection, perforation and the risk of sedation and the patient wishes to proceed.    Rhonda Sails, MD Gastroenterology 03/07/2018  7:24 AM

## 2018-03-07 NOTE — Transfer of Care (Signed)
Immediate Anesthesia Transfer of Care Note  Patient: Rhonda Tyler  Procedure(s) Performed: ESOPHAGOGASTRODUODENOSCOPY (EGD) WITH PROPOFOL (N/A )  Patient Location: PACU and Endoscopy Unit  Anesthesia Type:General  Level of Consciousness: drowsy  Airway & Oxygen Therapy: Patient Spontanous Breathing and Patient connected to face mask oxygen  Post-op Assessment: Report given to RN and Post -op Vital signs reviewed and stable  Post vital signs: Reviewed and stable  Last Vitals:  Vitals Value Taken Time  BP    Temp    Pulse    Resp    SpO2      Last Pain:  Vitals:   03/07/18 0646  TempSrc: Tympanic  PainSc: 0-No pain         Complications: No apparent anesthesia complications

## 2018-03-07 NOTE — Anesthesia Postprocedure Evaluation (Signed)
Anesthesia Post Note  Patient: Rhonda Tyler  Procedure(s) Performed: ESOPHAGOGASTRODUODENOSCOPY (EGD) WITH PROPOFOL (N/A )  Patient location during evaluation: Endoscopy Anesthesia Type: General Level of consciousness: awake and alert Pain management: pain level controlled Vital Signs Assessment: post-procedure vital signs reviewed and stable Respiratory status: spontaneous breathing, nonlabored ventilation, respiratory function stable and patient connected to nasal cannula oxygen Cardiovascular status: blood pressure returned to baseline and stable Postop Assessment: no apparent nausea or vomiting Anesthetic complications: no     Last Vitals:  Vitals:   03/07/18 0857 03/07/18 0907  BP: 130/62 (!) 153/80  Pulse: 68 67  Resp: (!) 24 13  Temp:    SpO2: 100% 99%    Last Pain:  Vitals:   03/07/18 0907  TempSrc:   PainSc: 0-No pain                 Precious Haws [place] Harder

## 2018-03-07 NOTE — Op Note (Signed)
[facility] Gastroenterology Patient Name: Rhonda Tyler Procedure Date: 03/07/2018 7:28 AM MRN: [id number] Account #: [id number] Date of Birth: 1940-06-22 Admit Type: Outpatient Age: 77 Room: [facility] ENDO ROOM 1 Gender: Female Note Status: Finalized Procedure:            Upper GI endoscopy Indications:          Follow-up of gastric polyps, For therapy of gastric                        polyps Providers:            Lollie Sails, MD Referring MD:         Ocie Cornfield. Ouida Sills MD, MD (Referring MD) Medicines:            Monitored Anesthesia Care Complications:        No immediate complications.All sites showed good                        hemostasis. Procedure:            Pre-Anesthesia Assessment:                       - ASA Grade Assessment: III - A patient with severe                        systemic disease.                       After obtaining informed consent, the endoscope was                        passed under direct vision. Throughout the procedure,                        the patient's blood pressure, pulse, and oxygen                        saturations were monitored continuously. The Endoscope                        was introduced through the mouth, and advanced to the                        third part of duodenum. The upper GI endoscopy was                        accomplished without difficulty. The patient tolerated                        the procedure well. Findings:      The examined duodenum was normal.      Many 1 to 9 mm pedunculated and sessile polyps with no bleeding and       stigmata of recent bleeding were found in the gastric body. These 11       inflamed polyps with stigmata of inflamation/ulceration and bleeding       were removed with a lift and cut technique using a hot snare. Resection       and retrieval were complete.      The cardia and gastric fundus were normal on retroflexion otherwise. A  single clip is noted at a polyp  base in the anterior body of the stomach.      The examined esophagus was normal. Impression:           - Normal examined duodenum.                       - Many gastric polyps. Resected and retrieved.                       - Normal esophagus. Recommendation:       - Discharge patient to home.                       - Clear liquid diet today.                       - Full liquid diet for 2 days, then advance as                        tolerated to soft diet for 2 days.                       - Return to GI clinic in 3 weeks. Procedure Code(s):    --- Professional ---                       [phone], Esophagogastroduodenoscopy, flexible, transoral;                        with removal of tumor(s), polyp(s), or other lesion(s)                        by snare technique Diagnosis Code(s):    --- Professional ---                       K31.7, Polyp of stomach and duodenum CPT copyright 2018 American Medical Association. All rights reserved. The codes documented in this report are preliminary and upon coder review may  be revised to meet current compliance requirements. Lollie Sails, MD 03/07/2018 8:33:54 AM This report has been signed electronically. Number of Addenda: 0 Note Initiated On: 03/07/2018 7:28 AM      [facility]

## 2018-03-07 NOTE — Anesthesia Preprocedure Evaluation (Signed)
Anesthesia Evaluation  Patient identified by MRN, date of birth, ID band Patient awake    Reviewed: Allergy & Precautions, H&P , NPO status , Patient's Chart, lab work & pertinent test results  History of Anesthesia Complications Negative for: history of anesthetic complications  Airway Mallampati: III  TM Distance: <3 FB Neck ROM: limited    Dental  (+) Chipped, Poor Dentition, Missing, Partial Upper   Pulmonary neg shortness of breath, sleep apnea and Continuous Positive Airway Pressure Ventilation , former smoker,           Cardiovascular Exercise Tolerance: Good hypertension, (-) angina+ CAD, + Past MI and + Cardiac Stents  (-) DOE      Neuro/Psych negative neurological ROS  negative psych ROS   GI/Hepatic Neg liver ROS, GERD  Medicated and Controlled,  Endo/Other  diabetes, Poorly Controlled, Type 2  Renal/GU negative Renal ROS  negative genitourinary   Musculoskeletal   Abdominal   Peds  Hematology negative hematology ROS (+)   Anesthesia Other Findings Past Medical History: No date: Arthritis No date: Cervical disc disease No date: Cervical disc disease No date: Chest pain No date: Cholecystolithiasis No date: Colon polyp No date: Coronary artery disease No date: Diabetes mellitus without complication (HCC) No date: GERD (gastroesophageal reflux disease) No date: Hemorrhoid No date: History of diabetic neuropathy No date: Hyperlipemia No date: Hypertension No date: IBS (irritable bowel syndrome) No date: Myocardial infarction (HCC) No date: Sleep apnea  Past Surgical History: No date: ABDOMINAL HYSTERECTOMY No date: arthroscopic rotator cuff repair No date: BACK SURGERY No date: CARDIAC CATHETERIZATION No date: CHOLECYSTECTOMY No date: COLONOSCOPY 11/29/2017: COLONOSCOPY WITH PROPOFOL; N/A     Comment:  Procedure: COLONOSCOPY WITH PROPOFOL;  Surgeon:               Lollie Sails, MD;   Location: [facility] ENDOSCOPY;                Service: Endoscopy;  Laterality: N/A; No date: CORONARY ANGIOPLASTY     Comment:  PTCA and stent of RCA No date: CORONARY ARTERY BYPASS GRAFT No date: ESOPHAGOGASTRODUODENOSCOPY 11/29/2017: ESOPHAGOGASTRODUODENOSCOPY (EGD) WITH PROPOFOL; N/A     Comment:  Procedure: ESOPHAGOGASTRODUODENOSCOPY (EGD) WITH               PROPOFOL;  Surgeon: Lollie Sails, MD;  Location:               [facility] ENDOSCOPY;  Service: Endoscopy;  Laterality: N/A; No date: EXCISION OF ADNEXAL MASS No date: JOINT REPLACEMENT     Comment:  left total knee replacement No date: OOPHORECTOMY No date: vesicular vaginal fistula  BMI    Body Mass Index:  36.90 kg/m      Reproductive/Obstetrics negative OB ROS                             Anesthesia Physical Anesthesia Plan  ASA: III  Anesthesia Plan: General   Post-op Pain Management:    Induction: Intravenous  PONV Risk Score and Plan: Propofol infusion and TIVA  Airway Management Planned: Natural Airway and Nasal Cannula  Additional Equipment:   Intra-op Plan:   Post-operative Plan:   Informed Consent: I have reviewed the patients History and Physical, chart, labs and discussed the procedure including the risks, benefits and alternatives for the proposed anesthesia with the patient or authorized representative who has indicated his/her understanding and acceptance.   Dental Advisory Given  Plan Discussed with:  Anesthesiologist, CRNA and Surgeon  Anesthesia Plan Comments: (Patient consented for risks of anesthesia including but not limited to:  - adverse reactions to medications - risk of intubation if required - damage to teeth, lips or other oral mucosa - sore throat or hoarseness - Damage to heart, brain, lungs or loss of life  Patient voiced understanding.)        Anesthesia Quick Evaluation

## 2018-03-08 ENCOUNTER — Encounter: Payer: Self-pay | Admitting: Gastroenterology

## 2018-03-09 LAB — SURGICAL PATHOLOGY

## 2018-03-23 ENCOUNTER — Other Ambulatory Visit: Payer: Self-pay

## 2018-03-23 ENCOUNTER — Encounter: Payer: Self-pay | Admitting: Emergency Medicine

## 2018-03-23 ENCOUNTER — Emergency Department: Payer: Medicare Other

## 2018-03-23 ENCOUNTER — Inpatient Hospital Stay
Admission: EM | Admit: 2018-03-23 | Discharge: 2018-03-26 | DRG: 193 | Disposition: A | Discharge status: Home Health | Payer: Medicare Other | Attending: Family Medicine | Admitting: Family Medicine

## 2018-03-23 DIAGNOSIS — Z951 Presence of aortocoronary bypass graft: Secondary | ICD-10-CM

## 2018-03-23 DIAGNOSIS — G4733 Obstructive sleep apnea (adult) (pediatric): Secondary | ICD-10-CM | POA: Diagnosis present

## 2018-03-23 DIAGNOSIS — Z6836 Body mass index (BMI) 36.0-36.9, adult: Secondary | ICD-10-CM | POA: Diagnosis not present

## 2018-03-23 DIAGNOSIS — R0902 Hypoxemia: Secondary | ICD-10-CM

## 2018-03-23 DIAGNOSIS — D638 Anemia in other chronic diseases classified elsewhere: Secondary | ICD-10-CM | POA: Diagnosis present

## 2018-03-23 DIAGNOSIS — Z7982 Long term (current) use of aspirin: Secondary | ICD-10-CM

## 2018-03-23 DIAGNOSIS — I2510 Atherosclerotic heart disease of native coronary artery without angina pectoris: Secondary | ICD-10-CM | POA: Diagnosis present

## 2018-03-23 DIAGNOSIS — E669 Obesity, unspecified: Secondary | ICD-10-CM | POA: Diagnosis present

## 2018-03-23 DIAGNOSIS — E1140 Type 2 diabetes mellitus with diabetic neuropathy, unspecified: Secondary | ICD-10-CM | POA: Diagnosis present

## 2018-03-23 DIAGNOSIS — Z96652 Presence of left artificial knee joint: Secondary | ICD-10-CM | POA: Diagnosis present

## 2018-03-23 DIAGNOSIS — E785 Hyperlipidemia, unspecified: Secondary | ICD-10-CM | POA: Diagnosis present

## 2018-03-23 DIAGNOSIS — Z888 Allergy status to other drugs, medicaments and biological substances status: Secondary | ICD-10-CM

## 2018-03-23 DIAGNOSIS — I10 Essential (primary) hypertension: Secondary | ICD-10-CM | POA: Diagnosis present

## 2018-03-23 DIAGNOSIS — I252 Old myocardial infarction: Secondary | ICD-10-CM | POA: Diagnosis not present

## 2018-03-23 DIAGNOSIS — Z955 Presence of coronary angioplasty implant and graft: Secondary | ICD-10-CM | POA: Diagnosis not present

## 2018-03-23 DIAGNOSIS — K219 Gastro-esophageal reflux disease without esophagitis: Secondary | ICD-10-CM | POA: Diagnosis present

## 2018-03-23 DIAGNOSIS — Z9989 Dependence on other enabling machines and devices: Secondary | ICD-10-CM

## 2018-03-23 DIAGNOSIS — J45909 Unspecified asthma, uncomplicated: Secondary | ICD-10-CM | POA: Diagnosis present

## 2018-03-23 DIAGNOSIS — Z880 Allergy status to penicillin: Secondary | ICD-10-CM

## 2018-03-23 DIAGNOSIS — Z885 Allergy status to narcotic agent status: Secondary | ICD-10-CM | POA: Diagnosis not present

## 2018-03-23 DIAGNOSIS — M5090 Cervical disc disorder, unspecified, unspecified cervical region: Secondary | ICD-10-CM | POA: Diagnosis present

## 2018-03-23 DIAGNOSIS — D509 Iron deficiency anemia, unspecified: Secondary | ICD-10-CM | POA: Diagnosis present

## 2018-03-23 DIAGNOSIS — J189 Pneumonia, unspecified organism: Principal | ICD-10-CM | POA: Diagnosis present

## 2018-03-23 DIAGNOSIS — Z79899 Other long term (current) drug therapy: Secondary | ICD-10-CM

## 2018-03-23 DIAGNOSIS — Z87891 Personal history of nicotine dependence: Secondary | ICD-10-CM

## 2018-03-23 DIAGNOSIS — M1990 Unspecified osteoarthritis, unspecified site: Secondary | ICD-10-CM | POA: Diagnosis present

## 2018-03-23 DIAGNOSIS — J9601 Acute respiratory failure with hypoxia: Secondary | ICD-10-CM | POA: Diagnosis present

## 2018-03-23 DIAGNOSIS — E1165 Type 2 diabetes mellitus with hyperglycemia: Secondary | ICD-10-CM | POA: Diagnosis present

## 2018-03-23 DIAGNOSIS — Z881 Allergy status to other antibiotic agents status: Secondary | ICD-10-CM

## 2018-03-23 DIAGNOSIS — Z794 Long term (current) use of insulin: Secondary | ICD-10-CM

## 2018-03-23 DIAGNOSIS — Z882 Allergy status to sulfonamides status: Secondary | ICD-10-CM

## 2018-03-23 LAB — MAGNESIUM: Magnesium: 1.5 mg/dL — ABNORMAL LOW (ref 1.7–2.4)

## 2018-03-23 LAB — TROPONIN I: Troponin I: 0.03 ng/mL (ref <0.03)

## 2018-03-23 LAB — BASIC METABOLIC PANEL
ANION GAP: 12 (ref 5–15)
BUN: 11 mg/dL (ref 8–23)
CALCIUM: 9.5 mg/dL (ref 8.9–10.3)
CO2: 22 mmol/L (ref 22–32)
CREATININE: 0.93 mg/dL (ref 0.44–1.00)
Chloride: 99 mmol/L (ref 98–111)
GFR, EST NON AFRICAN AMERICAN: 59 mL/min — AB (ref >60)
Glucose, Bld: 257 mg/dL — ABNORMAL HIGH (ref 70–99)
Potassium: 3.7 mmol/L (ref 3.5–5.1)
Sodium: 133 mmol/L — ABNORMAL LOW (ref 135–145)

## 2018-03-23 LAB — CBC
HCT: 33 % — ABNORMAL LOW (ref 36.0–46.0)
Hemoglobin: 10 g/dL — ABNORMAL LOW (ref 12.0–15.0)
MCH: 22.2 pg — ABNORMAL LOW (ref 26.0–34.0)
MCHC: 30.3 g/dL (ref 30.0–36.0)
MCV: 73.3 fL — ABNORMAL LOW (ref 80.0–100.0)
NRBC: 0.2 % (ref 0.0–0.2)
Platelets: 265 10*3/uL (ref 150–400)
RBC: 4.5 MIL/uL (ref 3.87–5.11)
RDW: 18.4 % — AB (ref 11.5–15.5)
WBC: 11.7 10*3/uL — AB (ref 4.0–10.5)

## 2018-03-23 LAB — BRAIN NATRIURETIC PEPTIDE: B NATRIURETIC PEPTIDE 5: 311 pg/mL — AB (ref 0.0–100.0)

## 2018-03-23 LAB — GLUCOSE, CAPILLARY
GLUCOSE-CAPILLARY: 221 mg/dL — AB (ref 70–99)
Glucose-Capillary: 268 mg/dL — ABNORMAL HIGH (ref 70–99)

## 2018-03-23 MED ORDER — PANTOPRAZOLE SODIUM 40 MG PO TBEC
40.0000 mg | DELAYED_RELEASE_TABLET | Freq: Two times a day (BID) | ORAL | Status: DC
  Administered 2018-03-23 – 2018-03-26 (×6): 40 mg via ORAL
  Filled 2018-03-23 (×6): qty 1

## 2018-03-23 MED ORDER — BISACODYL 5 MG PO TBEC: 5.0000 mg | DELAYED_RELEASE_TABLET | Freq: Every day | ORAL | Status: DC | PRN

## 2018-03-23 MED ORDER — SODIUM CHLORIDE 0.9 % IV SOLN
1.0000 g | Freq: Once | INTRAVENOUS | Status: AC
  Administered 2018-03-23: 1 g via INTRAVENOUS
  Filled 2018-03-23: qty 10

## 2018-03-23 MED ORDER — FENOFIBRATE 160 MG PO TABS
160.0000 mg | ORAL_TABLET | Freq: Every day | ORAL | Status: DC
  Administered 2018-03-24 – 2018-03-26 (×3): 160 mg via ORAL
  Filled 2018-03-23 (×3): qty 1

## 2018-03-23 MED ORDER — SODIUM CHLORIDE 0.9 % IV SOLN
500.0000 mg | Freq: Once | INTRAVENOUS | Status: AC
  Administered 2018-03-23: 500 mg via INTRAVENOUS
  Filled 2018-03-23: qty 500

## 2018-03-23 MED ORDER — POTASSIUM CHLORIDE CRYS ER 20 MEQ PO TBCR
20.0000 meq | EXTENDED_RELEASE_TABLET | Freq: Every day | ORAL | Status: DC
  Administered 2018-03-24 – 2018-03-26 (×3): 20 meq via ORAL
  Filled 2018-03-23 (×3): qty 1

## 2018-03-23 MED ORDER — LORATADINE 10 MG PO TABS
10.0000 mg | ORAL_TABLET | Freq: Every day | ORAL | Status: DC
  Administered 2018-03-24 – 2018-03-26 (×3): 10 mg via ORAL
  Filled 2018-03-23 (×3): qty 1

## 2018-03-23 MED ORDER — INSULIN ASPART PROT & ASPART (70-30 MIX) 100 UNIT/ML ~~LOC~~ SUSP
45.0000 [IU] | Freq: Every day | SUBCUTANEOUS | Status: DC
  Administered 2018-03-24 – 2018-03-25 (×2): 45 [IU] via SUBCUTANEOUS
  Filled 2018-03-23 (×2): qty 10

## 2018-03-23 MED ORDER — SODIUM CHLORIDE 0.9 % IV SOLN
1.0000 g | INTRAVENOUS | Status: DC
  Administered 2018-03-24 – 2018-03-25 (×2): 1 g via INTRAVENOUS
  Filled 2018-03-23: qty 10
  Filled 2018-03-23 (×2): qty 1

## 2018-03-23 MED ORDER — LISINOPRIL 20 MG PO TABS
40.0000 mg | ORAL_TABLET | Freq: Every day | ORAL | Status: DC
  Administered 2018-03-24 – 2018-03-26 (×3): 40 mg via ORAL
  Filled 2018-03-23 (×3): qty 2

## 2018-03-23 MED ORDER — ONDANSETRON HCL 4 MG PO TABS: 4.0000 mg | ORAL_TABLET | Freq: Four times a day (QID) | ORAL | Status: DC | PRN

## 2018-03-23 MED ORDER — INSULIN ASPART 100 UNIT/ML ~~LOC~~ SOLN
0.0000 [IU] | Freq: Every day | SUBCUTANEOUS | Status: DC
  Administered 2018-03-23 – 2018-03-24 (×2): 3 [IU] via SUBCUTANEOUS
  Filled 2018-03-23 (×2): qty 1

## 2018-03-23 MED ORDER — FUROSEMIDE 10 MG/ML IJ SOLN
40.0000 mg | Freq: Once | INTRAMUSCULAR | Status: AC
  Administered 2018-03-23: 40 mg via INTRAVENOUS
  Filled 2018-03-23: qty 4

## 2018-03-23 MED ORDER — FUROSEMIDE 10 MG/ML IJ SOLN
40.0000 mg | Freq: Two times a day (BID) | INTRAMUSCULAR | Status: DC
  Administered 2018-03-23 – 2018-03-24 (×2): 40 mg via INTRAVENOUS
  Filled 2018-03-23 (×2): qty 4

## 2018-03-23 MED ORDER — ENOXAPARIN SODIUM 40 MG/0.4ML ~~LOC~~ SOLN
40.0000 mg | SUBCUTANEOUS | Status: DC
  Administered 2018-03-23 – 2018-03-25 (×3): 40 mg via SUBCUTANEOUS
  Filled 2018-03-23 (×3): qty 0.4

## 2018-03-23 MED ORDER — MAGNESIUM OXIDE 400 (241.3 MG) MG PO TABS
400.0000 mg | ORAL_TABLET | Freq: Two times a day (BID) | ORAL | Status: DC
  Administered 2018-03-23 – 2018-03-26 (×6): 400 mg via ORAL
  Filled 2018-03-23 (×6): qty 1

## 2018-03-23 MED ORDER — SODIUM CHLORIDE 0.9 % IV SOLN
500.0000 mg | INTRAVENOUS | Status: DC
  Administered 2018-03-24: 500 mg via INTRAVENOUS
  Filled 2018-03-23 (×2): qty 500

## 2018-03-23 MED ORDER — ASPIRIN EC 81 MG PO TBEC
81.0000 mg | DELAYED_RELEASE_TABLET | Freq: Every day | ORAL | Status: DC
  Administered 2018-03-24 – 2018-03-26 (×3): 81 mg via ORAL
  Filled 2018-03-23 (×3): qty 1

## 2018-03-23 MED ORDER — PROPRANOLOL HCL 40 MG PO TABS
40.0000 mg | ORAL_TABLET | Freq: Two times a day (BID) | ORAL | Status: DC
  Administered 2018-03-23 – 2018-03-26 (×6): 40 mg via ORAL
  Filled 2018-03-23 (×7): qty 1

## 2018-03-23 MED ORDER — CALCIUM CARBONATE ANTACID 500 MG PO CHEW
600.0000 mg | CHEWABLE_TABLET | Freq: Two times a day (BID) | ORAL | Status: DC
  Administered 2018-03-23 – 2018-03-26 (×6): 600 mg via ORAL
  Filled 2018-03-23 (×6): qty 3

## 2018-03-23 MED ORDER — SENNOSIDES-DOCUSATE SODIUM 8.6-50 MG PO TABS: 1.0000 | ORAL_TABLET | Freq: Every evening | ORAL | Status: DC | PRN

## 2018-03-23 MED ORDER — GUAIFENESIN-DM 100-10 MG/5ML PO SYRP
5.0000 mL | ORAL_SOLUTION | ORAL | Status: DC | PRN
  Filled 2018-03-23: qty 5

## 2018-03-23 MED ORDER — ACETAMINOPHEN 325 MG PO TABS
650.0000 mg | ORAL_TABLET | Freq: Four times a day (QID) | ORAL | Status: DC | PRN
  Administered 2018-03-24: 07:00:00 650 mg via ORAL
  Filled 2018-03-23: qty 2

## 2018-03-23 MED ORDER — ALBUTEROL SULFATE (2.5 MG/3ML) 0.083% IN NEBU
2.5000 mg | INHALATION_SOLUTION | RESPIRATORY_TRACT | Status: DC | PRN
  Administered 2018-03-23: 2.5 mg via RESPIRATORY_TRACT
  Filled 2018-03-23: qty 3

## 2018-03-23 MED ORDER — FERROUS SULFATE 325 (65 FE) MG PO TABS
325.0000 mg | ORAL_TABLET | Freq: Every day | ORAL | Status: DC
  Administered 2018-03-24 – 2018-03-26 (×3): 325 mg via ORAL
  Filled 2018-03-23 (×3): qty 1

## 2018-03-23 MED ORDER — INSULIN ASPART 100 UNIT/ML ~~LOC~~ SOLN
0.0000 [IU] | Freq: Three times a day (TID) | SUBCUTANEOUS | Status: DC
  Administered 2018-03-24 (×3): 5 [IU] via SUBCUTANEOUS
  Administered 2018-03-25: 09:00:00 3 [IU] via SUBCUTANEOUS
  Filled 2018-03-23 (×4): qty 1

## 2018-03-23 MED ORDER — ACETAMINOPHEN 650 MG RE SUPP: 650.0000 mg | Freq: Four times a day (QID) | RECTAL | Status: DC | PRN

## 2018-03-23 MED ORDER — IPRATROPIUM-ALBUTEROL 0.5-2.5 (3) MG/3ML IN SOLN
3.0000 mL | Freq: Four times a day (QID) | RESPIRATORY_TRACT | Status: DC
  Administered 2018-03-23 – 2018-03-26 (×11): 3 mL via RESPIRATORY_TRACT
  Filled 2018-03-23 (×11): qty 3

## 2018-03-23 MED ORDER — ONDANSETRON HCL 4 MG/2ML IJ SOLN: 4.0000 mg | Freq: Four times a day (QID) | INTRAMUSCULAR | Status: DC | PRN

## 2018-03-23 MED ORDER — INSULIN ASPART PROT & ASPART (70-30 MIX) 100 UNIT/ML ~~LOC~~ SUSP
30.0000 [IU] | Freq: Every day | SUBCUTANEOUS | Status: DC
  Administered 2018-03-23 – 2018-03-24 (×2): 30 [IU] via SUBCUTANEOUS
  Filled 2018-03-23 (×2): qty 10

## 2018-03-23 MED ORDER — VITAMIN B-12 1000 MCG PO TABS
2000.0000 ug | ORAL_TABLET | Freq: Every day | ORAL | Status: DC
  Administered 2018-03-24 – 2018-03-26 (×3): 2000 ug via ORAL
  Filled 2018-03-23 (×3): qty 2

## 2018-03-23 MED ORDER — ALLOPURINOL 100 MG PO TABS
100.0000 mg | ORAL_TABLET | Freq: Every day | ORAL | Status: DC
  Administered 2018-03-24 – 2018-03-26 (×3): 100 mg via ORAL
  Filled 2018-03-23 (×3): qty 1

## 2018-03-23 MED ORDER — HYDRALAZINE HCL 20 MG/ML IJ SOLN
10.0000 mg | Freq: Four times a day (QID) | INTRAMUSCULAR | Status: DC | PRN
  Administered 2018-03-23: 10 mg via INTRAVENOUS
  Filled 2018-03-23: qty 1

## 2018-03-23 NOTE — Progress Notes (Signed)
Advanced Care Plan.  Purpose of Encounter: CODE STATUS. Parties in Attendance: The patient and me. Patient's Decisional Capacity: Yes. Medical Story: Rhonda Tyler  is a 77 y.o. female with a known history of hypertension, diabetes, hyperlipidemia, CAD, status post CABG, sleep apnea, GERD, arthritis.  The patient is being admitted for acute respiratory failure with hypoxia due to bilateral pneumonia CAP, possible new onset of CHF.  I discussed with patient about the patient's current condition, prognosis and CODE STATUS.  Patient wants to be resuscitated and intubated if she has a cardiopulmonary arrest. Plan:  Code Status: Full code. Time spent discussing advance care planning: 17 minutes.

## 2018-03-23 NOTE — ED Notes (Signed)
O2 sat 89% on 2L via n/c. Increase O2 to 4L. O2 sat 92-93%. Increased to 5L via n/c.

## 2018-03-23 NOTE — ED Notes (Signed)
Date and time results received: 03/23/18 1518   Test: troponin Critical Value: 0.03  Name of Provider Notified: Dr Archie Balboa

## 2018-03-23 NOTE — ED Notes (Signed)
Patient transported to X-ray 

## 2018-03-23 NOTE — H&P (Addendum)
[facility] [place] NAME: Rhonda Tyler    MR#:  [id number]  DATE OF BIRTH:  1940-06-19  DATE OF ADMISSION:  03/23/2018  PRIMARY CARE PHYSICIAN: Kirk Ruths, MD   REQUESTING/REFERRING PHYSICIAN: Dr. Archie Balboa.  CHIEF COMPLAINT:   Chief Complaint  Patient presents with  . Shortness of Breath   Shortness of breath for 3 days. HISTORY OF PRESENT ILLNESS:  Rhonda Tyler  is a 77 y.o. female with a known history of hypertension, diabetes, hyperlipidemia, CAD, status post CABG, sleep apnea, GERD, arthritis, OSA and diabetes neuropathy.  The patient has had shortness rise for the past 3 days, worsening today.  She denies any fever or chills, no cough or wheezing, no orthopnea or nocturnal dyspnea or leg edema.  She has uncontrolled blood sugar at home recently.  She complains of generalized weakness.  She is found hypoxia at 80s in the ED, put on oxygen by nasal cannula 2 L then increased to 5 L.  Chest x-ray report bilateral basilar pneumonia, cannot exclude superimposed low-grade compensated CHF.  She is treated with Zithromax, Rocephin and Lasix in the ED. PAST MEDICAL HISTORY:   Past Medical History:  Diagnosis Date  . Arthritis   . Cervical disc disease   . Cervical disc disease   . Chest pain   . Cholecystolithiasis   . Colon polyp   . Coronary artery disease   . Diabetes mellitus without complication ([place])   . GERD (gastroesophageal reflux disease)   . Hemorrhoid   . History of diabetic neuropathy   . Hyperlipemia   . Hypertension   . IBS (irritable bowel syndrome)   . Myocardial infarction ([place])   . Sleep apnea     PAST SURGICAL HISTORY:   Past Surgical History:  Procedure Laterality Date  . ABDOMINAL HYSTERECTOMY    . arthroscopic rotator cuff repair    . BACK SURGERY    . CARDIAC CATHETERIZATION    . CHOLECYSTECTOMY    . COLONOSCOPY    . COLONOSCOPY WITH PROPOFOL N/A 11/29/2017   Procedure: COLONOSCOPY WITH  PROPOFOL;  Surgeon: Lollie Sails, MD;  Location: [facility] ENDOSCOPY;  Service: Endoscopy;  Laterality: N/A;  . CORONARY ANGIOPLASTY     PTCA and stent of RCA  . CORONARY ARTERY BYPASS GRAFT    . ESOPHAGOGASTRODUODENOSCOPY    . ESOPHAGOGASTRODUODENOSCOPY (EGD) WITH PROPOFOL N/A 11/29/2017   Procedure: ESOPHAGOGASTRODUODENOSCOPY (EGD) WITH PROPOFOL;  Surgeon: Lollie Sails, MD;  Location: [facility] ENDOSCOPY;  Service: Endoscopy;  Laterality: N/A;  . ESOPHAGOGASTRODUODENOSCOPY (EGD) WITH PROPOFOL N/A 03/07/2018   Procedure: ESOPHAGOGASTRODUODENOSCOPY (EGD) WITH PROPOFOL;  Surgeon: Lollie Sails, MD;  Location: [facility] ENDOSCOPY;  Service: Endoscopy;  Laterality: N/A;  . EXCISION OF ADNEXAL MASS    . JOINT REPLACEMENT     left total knee replacement  . OOPHORECTOMY    . vesicular vaginal fistula      SOCIAL HISTORY:   Social History   Tobacco Use  . Smoking status: Former Smoker    Types: Cigarettes    Last attempt to quit: 12/01/1970    Years since quitting: 47.3  . Smokeless tobacco: Never Used  Substance Use Topics  . Alcohol use: Not Currently    FAMILY HISTORY:   Family History  Problem Relation Age of Onset  . Hypertension Mother   . Cancer Mother   . Heart attack Mother   . Hypertension Father   . Heart attack Father   . Breast  cancer Neg Hx     DRUG ALLERGIES:   Allergies  Allergen Reactions  . Allegra [Fexofenadine]   . Codeine   . Levaquin [Levofloxacin In D5w]   . Lipitor [Atorvastatin Calcium]   . Lopressor [Metoprolol Tartrate] Other (See Comments)    HEART RACES  . Penicillins   . Potassium-Containing Compounds   . Procardia [Nifedipine]   . Sucralfate   . Sulfa Antibiotics   . Tramadol     REVIEW OF SYSTEMS:   Review of Systems  Constitutional: Positive for malaise/fatigue. Negative for chills and fever.  HENT: Negative for sore throat.   Eyes: Negative for blurred vision and double vision.  Respiratory: Positive for shortness of breath.  Negative for cough, hemoptysis, sputum production, wheezing and stridor.   Cardiovascular: Negative for chest pain, palpitations, orthopnea and leg swelling.  Gastrointestinal: Negative for abdominal pain, blood in stool, diarrhea, melena, nausea and vomiting.  Genitourinary: Negative for dysuria, flank pain and hematuria.  Musculoskeletal: Negative for back pain and joint pain.  Skin: Negative for rash.  Neurological: Negative for dizziness, sensory change, focal weakness, seizures, loss of consciousness, weakness and headaches.  Endo/Heme/Allergies: Negative for polydipsia.  Psychiatric/Behavioral: Negative for depression. The patient is not nervous/anxious.     MEDICATIONS AT HOME:   Prior to Admission medications   Medication Sig Start Date End Date Taking? Authorizing Provider  acetaminophen (TYLENOL) 500 MG tablet Take 500 mg by mouth every 4 (four) hours as needed.   Yes [provider]  allopurinol (ZYLOPRIM) 100 MG tablet Take 100 mg by mouth daily.   Yes [provider]  aspirin EC 81 MG tablet Take 81 mg by mouth daily.   Yes [provider]  calcium carbonate (OS-CAL) 600 MG TABS tablet Take 600 mg by mouth 2 (two) times daily.    Yes [provider]  fenofibrate 160 MG tablet Take 160 mg by mouth daily.   Yes [provider]  ferrous sulfate 325 (65 FE) MG tablet Take 325 mg by mouth daily with breakfast.   Yes [provider]  insulin NPH-regular Human (NOVOLIN 70/30) (70-30) 100 UNIT/ML injection Inject 30-45 Units into the skin See admin instructions. Inject up to 45u under the skin every morning and up to 30u under the skin every night   Yes [provider]  insulin regular (NOVOLIN R,HUMULIN R) 100 units/mL injection Inject 4-10 Units into the skin daily before lunch.   Yes [provider]  lisinopril (PRINIVIL,ZESTRIL) 40 MG tablet Take 40 mg by mouth daily.   Yes [provider]  loratadine  (CLARITIN) 10 MG tablet Take 10 mg by mouth daily.   Yes [provider]  magnesium oxide (MAG-OX) 400 MG tablet Take 400 mg by mouth 2 (two) times daily.    Yes [provider]  metFORMIN (GLUCOPHAGE-XR) 500 MG 24 hr tablet Take 1,000 mg by mouth 2 (two) times daily.    Yes [provider]  metoCLOPramide (REGLAN) 10 MG tablet Take 10 mg by mouth 2 (two) times daily as needed for nausea or vomiting.    Yes [provider]  pantoprazole (PROTONIX) 40 MG tablet Take 40 mg by mouth 2 (two) times daily.   Yes [provider]  potassium chloride SA (K-DUR,KLOR-CON) 20 MEQ tablet Take 20 mEq by mouth daily.   Yes [provider]  propranolol (INDERAL) 40 MG tablet Take 40 mg by mouth 2 (two) times daily.    Yes [provider]  torsemide (DEMADEX) 10 MG tablet Take 10 mg by mouth daily.   Yes [provider]  vitamin B-12 (CYANOCOBALAMIN) 1000 MCG tablet Take 2,000 mcg by mouth daily.    Yes [provider]      VITAL SIGNS:  Blood pressure (!) 177/74, pulse 80, resp. rate (!) 28, height 5\' 3"  (1.6 m), weight 93 kg, SpO2 98 %.  PHYSICAL EXAMINATION:  Physical Exam  GENERAL:  77 y.o.-year-old patient lying in the bed with no acute distress.  Obesity. EYES: Pupils equal, round, reactive to light and accommodation. No scleral icterus. Extraocular muscles intact.  HEENT: Head atraumatic, normocephalic. Oropharynx and nasopharynx clear.  NECK:  Supple, no jugular venous distention. No thyroid enlargement, no tenderness.  LUNGS: Normal breath sounds bilaterally, no wheezing, rales,rhonchi or crepitation. No use of accessory muscles of respiration.  CARDIOVASCULAR: S1, S2 normal. No murmurs, rubs, or gallops.  ABDOMEN: Soft, nontender, nondistended. Bowel sounds present. No organomegaly or mass.  EXTREMITIES: No pedal edema, cyanosis, or clubbing.  NEUROLOGIC: Cranial nerves II through XII are intact. Muscle strength 5/5  in all extremities. Sensation intact. Gait not checked.  PSYCHIATRIC: The patient is alert and oriented x 3.  SKIN: No obvious rash, lesion, or ulcer.   LABORATORY PANEL:   CBC Recent Labs  Lab 03/23/18 1440  WBC 11.7*  HGB 10.0*  HCT 33.0*  PLT 265   ------------------------------------------------------------------------------------------------------------------  Chemistries  Recent Labs  Lab 03/23/18 1440  NA 133*  K 3.7  CL 99  CO2 22  GLUCOSE 257*  BUN 11  CREATININE 0.93  CALCIUM 9.5   ------------------------------------------------------------------------------------------------------------------  Cardiac Enzymes Recent Labs  Lab 03/23/18 1440  TROPONINI 0.03*   ------------------------------------------------------------------------------------------------------------------  RADIOLOGY:  Dg Chest 2 View  Result Date: 03/23/2018 CLINICAL DATA:  Increased shortness of breath for the past 2 days. History of coronary artery disease and previous MI, asthma, former smoker. EXAM: CHEST - 2 VIEW COMPARISON:  Chest x-ray of July 01, 2010 and chest CT scan of May 25, 2013. FINDINGS: The lungs are well-expanded. The interstitial markings are increased bilaterally especially at the lung bases. The heart is mildly enlarged. The pulmonary vascularity is prominent centrally. 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 There is calcification in the wall of the aortic arch. IMPRESSION: Patchy airspace opacities bilaterally worrisome for pneumonia. I cannot exclude superimposed low-grade compensated CHF in the appropriate clinical setting. Followup PA and lateral chest X-ray is recommended in 3-4 weeks following trial of antibiotic therapy to ensure resolution and exclude underlying malignancy. Electronically Signed   By: David  Martinique M.D.   On: 03/23/2018 15:29      IMPRESSION AND PLAN:   Acute respiratory failure with hypoxia due to bilateral pneumonia CAP,  rule out new onset of CHF. The patient will be admitted to medical floor. Continue oxygen by nasal cannula, DuoNeb every 6 hours, Robitussin as needed.  CAP.  Continue Zithromax and Rocephin, follow-up sputum culture and blood culture, Robitussin as needed.  Possible acute CHF, unknown type. Start Lasix 40 mg IV twice daily, hold torsemide, continue lisinopril, echocardiograph and fluid restriction.  Hypertension.  Continue home hypertension medication.  Hyperglycemia and diabetes 2: diabetes neuropathy. Start sliding scale, continue NovoLog 70/30 twice daily, check hemoglobin A1c.  CAD, status post CABG.  Continue home medication.  Anemia of chronic disease, iron deficiency.  The patient is on iron p.o. at home.  OSA and obesity.  CPAP at night.  All the records are reviewed and case discussed with ED  provider. Management plans discussed with the patient, family and they are in agreement.  CODE STATUS: Full code.  TOTAL TIME TAKING CARE OF THIS PATIENT: 38 minutes.    Demetrios Loll M.D on 03/23/2018 at 4:22 PM  Between 7am to 6pm - Pager - [phone]  After 6pm go to [email or web address] - [employer] [facility]  Office  [phone]  CC: Primary care physician; Kirk Ruths, MD   Note: This dictation was prepared with Dragon dictation along with smaller phrase technology. Any transcriptional errors that result from this process are unin

## 2018-03-23 NOTE — ED Provider Notes (Signed)
[facility] Emergency Department Provider Note  ____________________________________________   I have reviewed the triage vital signs and the nursing notes.   HISTORY  Chief Complaint Shortness of Breath   History limited by: Not Limited   HPI Rhonda Tyler is a 77 y.o. female who presents to the emergency department today because of concern for shortness of breath. The patient states that she started having shortness of breath 2 days ago. It came on gradually. She states that it got worse today. The patient denies any associated chest pain or cough. She denies any fevers. She denies any known sick contacts. No recent travel. States she has a history of asthma but has not had a problem with that in roughly 30 years. Uses CPAP at night.   Per medical record review patient has a history of sleep apnea, MI, DM  Past Medical History:  Diagnosis Date  . Arthritis   . Cervical disc disease   . Cervical disc disease   . Chest pain   . Cholecystolithiasis   . Colon polyp   . Coronary artery disease   . Diabetes mellitus without complication ([place])   . GERD (gastroesophageal reflux disease)   . Hemorrhoid   . History of diabetic neuropathy   . Hyperlipemia   . Hypertension   . IBS (irritable bowel syndrome)   . Myocardial infarction ([place])   . Sleep apnea     There are no active problems to display for this patient.   Past Surgical History:  Procedure Laterality Date  . ABDOMINAL HYSTERECTOMY    . arthroscopic rotator cuff repair    . BACK SURGERY    . CARDIAC CATHETERIZATION    . CHOLECYSTECTOMY    . COLONOSCOPY    . COLONOSCOPY WITH PROPOFOL N/A 11/29/2017   Procedure: COLONOSCOPY WITH PROPOFOL;  Surgeon: Lollie Sails, MD;  Location: [facility] ENDOSCOPY;  Service: Endoscopy;  Laterality: N/A;  . CORONARY ANGIOPLASTY     PTCA and stent of RCA  . CORONARY ARTERY BYPASS GRAFT    . ESOPHAGOGASTRODUODENOSCOPY    . ESOPHAGOGASTRODUODENOSCOPY (EGD)  WITH PROPOFOL N/A 11/29/2017   Procedure: ESOPHAGOGASTRODUODENOSCOPY (EGD) WITH PROPOFOL;  Surgeon: Lollie Sails, MD;  Location: [facility] ENDOSCOPY;  Service: Endoscopy;  Laterality: N/A;  . ESOPHAGOGASTRODUODENOSCOPY (EGD) WITH PROPOFOL N/A 03/07/2018   Procedure: ESOPHAGOGASTRODUODENOSCOPY (EGD) WITH PROPOFOL;  Surgeon: Lollie Sails, MD;  Location: [facility] ENDOSCOPY;  Service: Endoscopy;  Laterality: N/A;  . EXCISION OF ADNEXAL MASS    . JOINT REPLACEMENT     left total knee replacement  . OOPHORECTOMY    . vesicular vaginal fistula      Prior to Admission medications   Medication Sig Start Date End Date Taking? Authorizing Provider  acetaminophen (TYLENOL) 500 MG tablet Take 500 mg by mouth every 4 (four) hours as needed.    [provider]  allopurinol (ZYLOPRIM) 100 MG tablet Take 100 mg by mouth daily.    [provider]  aspirin EC 81 MG tablet Take 81 mg by mouth daily.    [provider]  calcium carbonate (OS-CAL) 600 MG TABS tablet Take 600 mg by mouth daily.    [provider]  estradiol (ESTRACE) 0.1 MG/GM vaginal cream Place 1 Applicatorful vaginally at bedtime.    [provider]  fenofibrate 160 MG tablet Take 160 mg by mouth daily.    [provider]  ferrous sulfate 325 (65 FE) MG tablet Take 325 mg by mouth daily with breakfast.  [provider]  insulin NPH-regular Human (NOVOLIN 70/30) (70-30) 100 UNIT/ML injection Inject 45 Units into the skin daily with breakfast. And 40 units at night    [provider]  insulin regular (NOVOLIN R,HUMULIN R) 100 units/mL injection Inject 4-10 Units into the skin daily before lunch.    [provider]  lisinopril (PRINIVIL,ZESTRIL) 40 MG tablet Take 40 mg by mouth daily.    [provider]  loratadine (CLARITIN) 10 MG tablet Take 10 mg by mouth daily.    [provider]  magnesium oxide (MAG-OX) 400 MG tablet Take 400 mg by mouth  daily.    [provider]  metFORMIN (GLUCOPHAGE-XR) 500 MG 24 hr tablet Take 500 mg by mouth daily with breakfast. Take 2 tablets two times a day    [provider]  metoCLOPramide (REGLAN) 10 MG tablet Take 10 mg by mouth 3 (three) times daily before meals.    [provider]  pantoprazole (PROTONIX) 40 MG tablet Take 40 mg by mouth 2 (two) times daily.    [provider]  potassium chloride SA (K-DUR,KLOR-CON) 20 MEQ tablet Take 20 mEq by mouth daily.    [provider]  propranolol (INDERAL) 40 MG tablet Take 40 mg by mouth daily.    [provider]  sucralfate (CARAFATE) 1 g tablet Take 1 g by mouth 4 (four) times daily -  with meals and at bedtime.    [provider]  torsemide (DEMADEX) 10 MG tablet Take 10 mg by mouth daily.    [provider]  vitamin B-12 (CYANOCOBALAMIN) 100 MCG tablet Take by mouth daily.    [provider]    Allergies Allegra [fexofenadine]; Codeine; Levaquin [levofloxacin in d5w]; Lipitor [atorvastatin calcium]; Lopressor [metoprolol tartrate]; Penicillins; Potassium-containing compounds; Procardia [nifedipine]; Sucralfate; Sulfa antibiotics; and Tramadol  Family History  Problem Relation Age of Onset  . Breast cancer Neg Hx     Social History Social History   Tobacco Use  . Smoking status: Former Smoker    Types: Cigarettes    Last attempt to quit: 12/01/1970    Years since quitting: 47.3  . Smokeless tobacco: Never Used  Substance Use Topics  . Alcohol use: Not Currently  . Drug use: Never    Review of Systems Constitutional: No fever/chills Eyes: No visual changes. ENT: No sore throat. Cardiovascular: Denies chest pain. Respiratory: positive for shortness of breath. Gastrointestinal: No abdominal pain.  No nausea, no vomiting.  No diarrhea.   Genitourinary: Negative for dysuria. Musculoskeletal: Negative for back pain. Skin: Negative for rash. Neurological:  Negative for headaches, focal weakness or numbness.  ____________________________________________   PHYSICAL EXAM:  VITAL SIGNS: ED Triage Vitals  Enc Vitals Group     BP 03/23/18 1433 (!) 180/59     Pulse Rate 03/23/18 1433 84     Resp 03/23/18 1433 (!) 22     Temp --      Temp src --      SpO2 03/23/18 1433 (!) 83 %     Weight 03/23/18 1438 205 lb (93 kg)     Height 03/23/18 1438 5\' 3"  (1.6 m)     Head Circumference --      Peak Flow --      Pain Score 03/23/18 1438 0   Constitutional: Alert and oriented.  Eyes: Conjunctivae are normal.  ENT      Head: Normocephalic and atraumatic.      Nose: No congestion/rhinnorhea.  Mouth/Throat: Mucous membranes are moist.      Neck: No stridor. Hematological/Lymphatic/Immunilogical: No cervical lymphadenopathy. Cardiovascular: Normal rate, regular rhythm.  No murmurs, rubs, or gallops.  Respiratory: Mild tachypnea with slight increase in respiratory rate. No crackles or rhonchi appreciated.  Gastrointestinal: Soft and non tender. No rebound. No guarding.  Genitourinary: Deferred Musculoskeletal: Normal range of motion in all extremities. Trace bilateral lower extremity edema.  Neurologic:  Normal speech and language. No gross focal neurologic deficits are appreciated.  Skin:  Skin is warm, dry and intact. No rash noted. Psychiatric: Mood and affect are normal. Speech and behavior are normal. Patient exhibits appropriate insight and judgment.  ____________________________________________    LABS (pertinent positives/negatives)  Trop 0.03 CBC wbc 11.7, hgb 10.0, plt 265 Bmp na 133, k 3.7, glu 257, cr 0.93  ____________________________________________   EKG  I, Nance Pear, attending physician, personally viewed and interpreted this EKG  EKG Time: 1445 Rate: 83 Rhythm: sinus rhythm Axis: normal Intervals: qtc 483 QRS: narrow ST changes: no st elevation Impression: normal  ekg  ____________________________________________    RADIOLOGY  CXR Patchy airspace opacities  ____________________________________________   PROCEDURES  Procedures  ____________________________________________   INITIAL IMPRESSION / ASSESSMENT AND PLAN / ED COURSE  Pertinent labs & imaging results that were available during my care of the patient were reviewed by me and considered in my medical decision making (see chart for details).   Patient presented to the emergency department today because of concern for shortness of breath and was found to be hypoxic. Differential would include pna, ptx, pe, chf, amongst other etiologies. Patient hypoxia did resolve when placed on oxygen. CXR is concerning for pneumonia. Possibly chf as well. Discussed this finding with the patient. Will plan on started IV abx and admission.    ____________________________________________   FINAL CLINICAL IMPRESSION(S) / ED DIAGNOSES  Final diagnoses:  Hypoxia  Community acquired pneumonia, unspecified laterality     Note: This dictation was prepared with Sales executive. Any transcriptional errors that result from this process are unintentional     Nance Pear, MD 03/23/18 1557

## 2018-03-23 NOTE — ED Triage Notes (Signed)
Pt in via POV, sent over from [facility].  Pt reports worsening shortness of breath since Monday, deneis any other complaints.  Pt 84% on room air.  Pt roomed at this time, placed on 2L nasal cannula.

## 2018-03-23 NOTE — Progress Notes (Signed)
Pt. With increased shortness of breath and tachypnea. SpO2 90% on O2 @ 6LPM via nasal cannula. Breath sounds reveal crepitant rales bilaterally. O2 increased to HFNC @ 12 LPM with improved SpO2 to 97%. Pt. Less shortness of breath and tachypneic. Lasix dose given at this time by RN.  Will continue to monitor.

## 2018-03-23 NOTE — Plan of Care (Signed)
Pt admitted from the ED.  BP 186/52, hydralazine given.  O2 sats in the high 90's on 5L O2.  Titrated O2 to 4L, O2 sats 95%.  Denies pain.

## 2018-03-24 ENCOUNTER — Inpatient Hospital Stay
Admit: 2018-03-24 | Discharge: 2018-03-24 | Disposition: A | Discharge status: Home / Self Care | Payer: Medicare Other | Attending: Internal Medicine | Admitting: Internal Medicine

## 2018-03-24 DIAGNOSIS — R0600 Dyspnea, unspecified: Secondary | ICD-10-CM

## 2018-03-24 LAB — BASIC METABOLIC PANEL
Anion gap: 9 (ref 5–15)
BUN: 14 mg/dL (ref 8–23)
CALCIUM: 9.2 mg/dL (ref 8.9–10.3)
CHLORIDE: 99 mmol/L (ref 98–111)
CO2: 30 mmol/L (ref 22–32)
CREATININE: 0.9 mg/dL (ref 0.44–1.00)
GFR calc Af Amer: >60 mL/min (ref >60)
GFR calc non Af Amer: >60 mL/min (ref >60)
Glucose, Bld: 269 mg/dL — ABNORMAL HIGH (ref 70–99)
Potassium: 3 mmol/L — ABNORMAL LOW (ref 3.5–5.1)
SODIUM: 138 mmol/L (ref 135–145)

## 2018-03-24 LAB — CBC
HEMATOCRIT: 31.1 % — AB (ref 36.0–46.0)
Hemoglobin: 9.3 g/dL — ABNORMAL LOW (ref 12.0–15.0)
MCH: 21.9 pg — ABNORMAL LOW (ref 26.0–34.0)
MCHC: 29.9 g/dL — AB (ref 30.0–36.0)
MCV: 73.2 fL — ABNORMAL LOW (ref 80.0–100.0)
PLATELETS: 232 10*3/uL (ref 150–400)
RBC: 4.25 MIL/uL (ref 3.87–5.11)
RDW: 18.4 % — AB (ref 11.5–15.5)
WBC: 8.4 10*3/uL (ref 4.0–10.5)
nRBC: 0 % (ref 0.0–0.2)

## 2018-03-24 LAB — GLUCOSE, CAPILLARY
GLUCOSE-CAPILLARY: 252 mg/dL — AB (ref 70–99)
GLUCOSE-CAPILLARY: 280 mg/dL — AB (ref 70–99)
Glucose-Capillary: 261 mg/dL — ABNORMAL HIGH (ref 70–99)
Glucose-Capillary: 270 mg/dL — ABNORMAL HIGH (ref 70–99)

## 2018-03-24 LAB — HEMOGLOBIN A1C
Hgb A1c MFr Bld: 6.5 % — ABNORMAL HIGH (ref 4.8–5.6)
MEAN PLASMA GLUCOSE: 139.85 mg/dL

## 2018-03-24 LAB — ECHOCARDIOGRAM COMPLETE
Height: 63 in
Weight: 3227.2 oz

## 2018-03-24 LAB — STREP PNEUMONIAE URINARY ANTIGEN: Strep Pneumo Urinary Antigen: NEGATIVE

## 2018-03-24 MED ORDER — PERFLUTREN LIPID MICROSPHERE
1.0000 mL | INTRAVENOUS | Status: AC | PRN
  Administered 2018-03-24: 2 mL via INTRAVENOUS
  Filled 2018-03-24: qty 10

## 2018-03-24 MED ORDER — TORSEMIDE 20 MG PO TABS: 10.0000 mg | ORAL_TABLET | Freq: Every day | ORAL | Status: DC

## 2018-03-24 MED ORDER — TORSEMIDE 20 MG PO TABS
10.0000 mg | ORAL_TABLET | Freq: Every day | ORAL | Status: DC
  Administered 2018-03-25 – 2018-03-26 (×2): 10 mg via ORAL
  Filled 2018-03-24 (×2): qty 1

## 2018-03-24 MED ORDER — POTASSIUM CHLORIDE 20 MEQ PO PACK: 40.0000 meq | PACK | Freq: Once | ORAL | Status: DC

## 2018-03-24 MED ORDER — POTASSIUM CHLORIDE CRYS ER 20 MEQ PO TBCR
40.0000 meq | EXTENDED_RELEASE_TABLET | Freq: Once | ORAL | Status: AC
  Administered 2018-03-24: 40 meq via ORAL
  Filled 2018-03-24: qty 4

## 2018-03-24 NOTE — Progress Notes (Signed)
*  PRELIMINARY RESULTS* Echocardiogram 2D Echocardiogram has been performed.  Rhonda Tyler 03/24/2018, 10:47 AM

## 2018-03-24 NOTE — Evaluation (Signed)
Physical Therapy Evaluation Patient Details Name: Rhonda Tyler MRN: [id number] DOB: 09-29-1940 Today's Date: 03/24/2018   History of Present Illness  Patient is a 77 year old female admitted for CAP and hypoxia.  PMH includes MI, diabetic neuropathy, cervical disc disease and CAD.  Clinical Impression  Pt is a 78 year old female who lives in a one story home with her husband.  She is independent without use of AD at baseline.  Pt required min A for bed mobility and was able to sit at EOB independently.  She presented with fair to good strength of UE's and LE's and reported very minimal sensation loss in toes.  Pt required a RW to stand from bedside as well as ambulation (20 ft) in room.  She became fatigued and O2 sats measured 86%.  Pt was able to recover to 94% following ~60 sec of rest.  Pt is interested in participation with therapy to return to baseline but is concerned about her husband's chemotherapy schedule and being a caregiver to him.  PT discussed benefit of therapy in the caregiver role and it was decided that home health PT would be most beneficial at this time.  She will continue to benefit from skilled PT with focus on tolerance to activity, strength and balance and use of AD for fall prevention and energy conservation.    Follow Up Recommendations Home health PT    Equipment Recommendations  Rolling walker with 5" wheels    Recommendations for Other Services       Precautions / Restrictions Precautions Precautions: Fall Restrictions Weight Bearing Restrictions: No      Mobility  Bed Mobility Overal bed mobility: Needs Assistance Bed Mobility: Supine to Sit     Supine to sit: Min assist;HOB elevated     General bed mobility comments: Very minimal hand held assist to complete upright posture.  Transfers Overall transfer level: Needs assistance Equipment used: Rolling walker (2 wheeled) Transfers: Sit to/from Stand Sit to Stand: Min guard         General  transfer comment: Pt more comfortable with use of RW at this time.  Relied on UE's to assist in standing.  Ambulation/Gait Ambulation/Gait assistance: Min assist Gait Distance (Feet): 20 Feet Assistive device: Rolling walker (2 wheeled)     Gait velocity interpretation: <1.8 ft/sec, indicate of risk for recurrent falls General Gait Details: Fair foot clearance, more stable with use of RW, some assistance with navigation of obstacles.  Stairs            Wheelchair Mobility    Modified Rankin (Stroke Patients Only)       Balance Overall balance assessment: Modified Independent                                           Pertinent Vitals/Pain Pain Assessment: No/denies pain    Home Living Family/patient expects to be discharged to:: Private residence Living Arrangements: Spouse/significant other(Husband is currently being treated for CA and pt is caregiver.) Available Help at Discharge: Family;Available PRN/intermittently Type of Home: House Home Access: Stairs to enter Entrance Stairs-Rails: None Entrance Stairs-Number of Steps: 1 Home Layout: One level Home Equipment: Walker - 2 wheels      Prior Function Level of Independence: Independent         Comments: 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  She is  currently the caregiver for her husband who has stage IV CA.     Hand Dominance        Extremity/Trunk Assessment   Upper Extremity Assessment Upper Extremity Assessment: Overall WFL for tasks assessed(Grossly 4-/5 bilaterally with the exception of L shoulder ROM (<90 deg AROM) and strength.  Pt reports this is due to hx of shoulder replacement.)    Lower Extremity Assessment Lower Extremity Assessment: Overall WFL for tasks assessed(Grossly 4/5 bilaterally.  Reports very minor sensation loss in great toes but otherwise no issues.)    Cervical / Trunk Assessment Cervical / Trunk Assessment: Normal  Communication    Communication: No difficulties  Cognition Arousal/Alertness: Awake/alert Behavior During Therapy: WFL for tasks assessed/performed Overall Cognitive Status: Within Functional Limits for tasks assessed                                        General Comments      Exercises Other Exercises Other Exercises: Provided education regarding benefit of PT for tolerance to activity and return to daily activity x4 min Other Exercises: Education and assistance with use of RW and adjustment to proper height x4 min   Assessment/Plan    PT Assessment Patient needs continued PT services  PT Problem List Decreased mobility;Decreased activity tolerance;Decreased balance       PT Treatment Interventions DME instruction;Therapeutic activities;Gait training;Therapeutic exercise;Patient/family education;Stair training;Balance training;Functional mobility training    PT Goals (Current goals can be found in the Care Plan section)  Acute Rehab PT Goals Patient Stated Goal: To return to daily general activity such as cleaning house. PT Goal Formulation: With patient Time For Goal Achievement: 04/07/18 Potential to Achieve Goals: Good    Frequency Min 2X/week   Barriers to discharge        Co-evaluation               AM-PAC PT "6 Clicks" Daily Activity  Outcome Measure Difficulty turning over in bed (including adjusting bedclothes, sheets and blankets)?: A Little Difficulty moving from lying on back to sitting on the side of the bed? : A Little Difficulty sitting down on and standing up from a chair with arms (e.g., wheelchair, bedside commode, etc,.)?: A Little Help needed moving to and from a bed to chair (including a wheelchair)?: A Little Help needed walking in hospital room?: A Little Help needed climbing 3-5 steps with a railing? : A Little 6 Click Score: 18    End of Session Equipment Utilized During Treatment: Gait belt;Oxygen Activity Tolerance: Patient limited  by fatigue Patient left: in chair;with chair alarm set;with call bell/phone within reach Nurse Communication: Mobility status PT Visit Diagnosis: Unsteadiness on feet (R26.81);Muscle weakness (generalized) (M62.81)    Time: 4562-5638 PT Time Calculation (min) (ACUTE ONLY): 25 min   Charges:   PT Evaluation $PT Eval Low Complexity: 1 Low PT Treatments $Therapeutic Activity: 8-22 mins        Roxanne Gates, PT, DPT   Roxanne Gates 03/24/2018, 4:49 PM

## 2018-03-24 NOTE — Progress Notes (Signed)
[place] [facility] [place] NAME: Rhonda Tyler    MR#:  [id number]  DATE OF BIRTH:  06-01-1940  SUBJECTIVE:  CHIEF COMPLAINT:   Chief Complaint  Patient presents with  . Shortness of Breath  Patient feeling better, no complaints  REVIEW OF SYSTEMS:  CONSTITUTIONAL: No fever, fatigue or weakness.  EYES: No blurred or double vision.  EARS, NOSE, AND THROAT: No tinnitus or ear pain.  RESPIRATORY: No cough, shortness of breath, wheezing or hemoptysis.  CARDIOVASCULAR: No chest pain, orthopnea, edema.  GASTROINTESTINAL: No nausea, vomiting, diarrhea or abdominal pain.  GENITOURINARY: No dysuria, hematuria.  ENDOCRINE: No polyuria, nocturia,  HEMATOLOGY: No anemia, easy bruising or bleeding SKIN: No rash or lesion. MUSCULOSKELETAL: No joint pain or arthritis.   NEUROLOGIC: No tingling, numbness, weakness.  PSYCHIATRY: No anxiety or depression.   ROS  DRUG ALLERGIES:   Allergies  Allergen Reactions  . Allegra [Fexofenadine]   . Codeine   . Levaquin [Levofloxacin In D5w]   . Lipitor [Atorvastatin Calcium]   . Lopressor [Metoprolol Tartrate] Other (See Comments)    HEART RACES  . Penicillins   . Potassium-Containing Compounds   . Procardia [Nifedipine]   . Sucralfate   . Sulfa Antibiotics   . Tramadol     VITALS:  Blood pressure (!) 158/69, pulse 75, temperature 98 F (36.7 C), temperature source Oral, resp. rate 18, height 5\' 3"  (1.6 m), weight 91.5 kg, SpO2 99 %.  PHYSICAL EXAMINATION:  GENERAL:  77 y.o.-year-old patient lying in the bed with no acute distress.  EYES: Pupils equal, round, reactive to light and accommodation. No scleral icterus. Extraocular muscles intact.  HEENT: Head atraumatic, normocephalic. Oropharynx and nasopharynx clear.  NECK:  Supple, no jugular venous distention. No thyroid enlargement, no tenderness.  LUNGS: Normal breath sounds bilaterally, no wheezing, rales,rhonchi or crepitation. No use of accessory  muscles of respiration.  CARDIOVASCULAR: S1, S2 normal. No murmurs, rubs, or gallops.  ABDOMEN: Soft, nontender, nondistended. Bowel sounds present. No organomegaly or mass.  EXTREMITIES: No pedal edema, cyanosis, or clubbing.  NEUROLOGIC: Cranial nerves II through XII are intact. Muscle strength 5/5 in all extremities. Sensation intact. Gait not checked.  PSYCHIATRIC: The patient is alert and oriented x 3.  SKIN: No obvious rash, lesion, or ulcer.   Physical Exam LABORATORY PANEL:   CBC Recent Labs  Lab 03/24/18 0518  WBC 8.4  HGB 9.3*  HCT 31.1*  PLT 232   ------------------------------------------------------------------------------------------------------------------  Chemistries  Recent Labs  Lab 03/23/18 1858 03/24/18 0518  NA  --  138  K  --  3.0*  CL  --  99  CO2  --  30  GLUCOSE  --  269*  BUN  --  14  CREATININE  --  0.90  CALCIUM  --  9.2  MG 1.5*  --    ------------------------------------------------------------------------------------------------------------------  Cardiac Enzymes Recent Labs  Lab 03/23/18 1440  TROPONINI 0.03*   ------------------------------------------------------------------------------------------------------------------  RADIOLOGY:  Dg Chest 2 View  Result Date: 03/23/2018 CLINICAL DATA:  Increased shortness of breath for the past 2 days. History of coronary artery disease and previous MI, asthma, former smoker. EXAM: CHEST - 2 VIEW COMPARISON:  Chest x-ray of July 01, 2010 and chest CT scan of May 25, 2013. FINDINGS: The lungs are well-expanded. The interstitial markings are increased bilaterally especially at the lung bases. The heart is mildly enlarged. The pulmonary vascularity is prominent centrally. 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 There is calcification  in the wall of the aortic arch. IMPRESSION: Patchy airspace opacities bilaterally worrisome for pneumonia. I cannot exclude superimposed low-grade  compensated CHF in the appropriate clinical setting. Followup PA and lateral chest X-ray is recommended in 3-4 weeks following trial of antibiotic therapy to ensure resolution and exclude underlying malignancy. Electronically Signed   By: David  Martinique M.D.   On: 03/23/2018 15:29    ASSESSMENT AND PLAN:  *Acute respiratory failure with hypoxia  due to bilateral pneumonia CAP Resolving Wean O2 off as tolerated Continue oxygen by nasal cannula, DuoNeb every 6 hours, Robitussin as needed.  *Acute CAP Resolving Continue pneumonia protocol, empiric Zithromax/Rocephin, follow-up on outstanding cultures, mucolytic agents, and wean O2 off as tolerated   *Congestive heart failure  Ruled out  Echocardiogram was a normal study   *Hypertension Stable Continue home hypertension medication  *Hyperglycemia and diabetes 2 Controlled on current regiment  *CAD, status post CABG Stable on current regiment  *Anemia of chronic disease, iron deficiency Continue iron  *Chronic OSA and obesity CPAP at night.  All the records are reviewed and case discussed with Care Management/Social Workerr. Management plans discussed with the patient, family and they are in agreement.  CODE STATUS: full  TOTAL TIME TAKING CARE OF THIS PATIENT: 40 minutes.     POSSIBLE D/C IN 1-2 DAYS, DEPENDING ON CLINICAL CONDITION.   Avel Peace Coulson Wehner M.D on 03/24/2018   Between 7am to 6pm - Pager - [phone]  After 6pm go to [email or web address] - password [email or web address] [facility]  Office  [phone]  CC: Primary care physician; Kirk Ruths, MD  Note: This dictation was prepared with Dragon dictation along with smaller phrase technology. Any transcriptional errors that result from this process are unintentional.

## 2018-03-25 LAB — BASIC METABOLIC PANEL
ANION GAP: 9 (ref 5–15)
BUN: 20 mg/dL (ref 8–23)
CO2: 29 mmol/L (ref 22–32)
Calcium: 9.5 mg/dL (ref 8.9–10.3)
Chloride: 100 mmol/L (ref 98–111)
Creatinine, Ser: 0.74 mg/dL (ref 0.44–1.00)
GFR calc Af Amer: >60 mL/min (ref >60)
Glucose, Bld: 249 mg/dL — ABNORMAL HIGH (ref 70–99)
POTASSIUM: 3.8 mmol/L (ref 3.5–5.1)
Sodium: 138 mmol/L (ref 135–145)

## 2018-03-25 LAB — GLUCOSE, CAPILLARY
GLUCOSE-CAPILLARY: 253 mg/dL — AB (ref 70–99)
GLUCOSE-CAPILLARY: 162 mg/dL — AB (ref 70–99)
Glucose-Capillary: 225 mg/dL — ABNORMAL HIGH (ref 70–99)
Glucose-Capillary: 281 mg/dL — ABNORMAL HIGH (ref 70–99)

## 2018-03-25 MED ORDER — INSULIN ASPART PROT & ASPART (70-30 MIX) 100 UNIT/ML ~~LOC~~ SUSP
50.0000 [IU] | Freq: Every day | SUBCUTANEOUS | Status: DC
  Administered 2018-03-26: 09:00:00 50 [IU] via SUBCUTANEOUS
  Filled 2018-03-25: qty 10

## 2018-03-25 MED ORDER — DOCUSATE SODIUM 100 MG PO CAPS
200.0000 mg | ORAL_CAPSULE | Freq: Two times a day (BID) | ORAL | Status: DC
  Administered 2018-03-25 – 2018-03-26 (×2): 200 mg via ORAL
  Filled 2018-03-25 (×2): qty 2

## 2018-03-25 MED ORDER — POLYETHYLENE GLYCOL 3350 17 G PO PACK
17.0000 g | PACK | Freq: Once | ORAL | Status: AC
  Administered 2018-03-25: 17 g via ORAL
  Filled 2018-03-25: qty 1

## 2018-03-25 MED ORDER — INSULIN ASPART 100 UNIT/ML ~~LOC~~ SOLN: 0.0000 [IU] | Freq: Every day | SUBCUTANEOUS | Status: DC

## 2018-03-25 MED ORDER — INSULIN ASPART PROT & ASPART (70-30 MIX) 100 UNIT/ML ~~LOC~~ SUSP
35.0000 [IU] | Freq: Every day | SUBCUTANEOUS | Status: DC
  Administered 2018-03-25: 35 [IU] via SUBCUTANEOUS
  Filled 2018-03-25: qty 10

## 2018-03-25 MED ORDER — METFORMIN HCL 500 MG PO TABS
1000.0000 mg | ORAL_TABLET | Freq: Two times a day (BID) | ORAL | Status: DC
  Administered 2018-03-25 – 2018-03-26 (×2): 1000 mg via ORAL
  Filled 2018-03-25 (×3): qty 2

## 2018-03-25 MED ORDER — AZITHROMYCIN 500 MG PO TABS
500.0000 mg | ORAL_TABLET | Freq: Every day | ORAL | Status: DC
  Administered 2018-03-25: 20:00:00 500 mg via ORAL
  Filled 2018-03-25: qty 1

## 2018-03-25 MED ORDER — PHENOL 1.4 % MT LIQD
1.0000 | OROMUCOSAL | Status: DC | PRN
  Filled 2018-03-25: qty 177

## 2018-03-25 MED ORDER — INSULIN ASPART 100 UNIT/ML ~~LOC~~ SOLN
0.0000 [IU] | Freq: Three times a day (TID) | SUBCUTANEOUS | Status: DC
  Administered 2018-03-25 (×2): 11 [IU] via SUBCUTANEOUS
  Administered 2018-03-26: 09:00:00 7 [IU] via SUBCUTANEOUS
  Filled 2018-03-25 (×3): qty 1

## 2018-03-25 NOTE — Progress Notes (Signed)
Inpatient Diabetes Program Recommendations  AACE/ADA: New Consensus Statement on Inpatient Glycemic Control (2019)  Target Ranges:  Prepandial:   less than 140 mg/dL      Peak postprandial:   less than 180 mg/dL (1-2 hours)      Critically ill patients:  140 - 180 mg/dL  Results for LEALA, BRYAND (MRN [id number]) as of 03/25/2018 08:45  Ref. Range 03/24/2018 07:26 03/24/2018 11:37 03/24/2018 16:41 03/24/2018 20:57 03/25/2018 07:41  Glucose-Capillary Latest Ref Range: 70 - 99 mg/dL 252 (H) 261 (H) 270 (H) 280 (H) 225 (H)    Review of Glycemic Control  Diabetes history: DM2 Outpatient Diabetes medications: 70/30 45 units QAM, 70/30 30 units QPM, Regular 4-10 units daily before lunch, Metformin XR 1000 mg BID Current orders for Inpatient glycemic control: 70/30 45 units QAM, 70/30 30 units QPM, Novolog 0-9 units TID with meals, Novolog 0-5 units QHS  Inpatient Diabetes Program Recommendations: Insulin - Basal: Please consider increasing 70/30 to 50 units QAM and 35 units QPM.  Thanks, Barnie Alderman, RN, MSN, CDE Diabetes Coordinator Inpatient Diabetes Program [phone] (Team Pager from 8am to 5pm)

## 2018-03-25 NOTE — Care Management Important Message (Signed)
Important Message  Patient Details  Name: Rhonda Tyler MRN: [id number] Date of Birth: March 14, 1941   Medicare Important Message Given:  Yes    Juliann Pulse A Orvell Careaga 03/25/2018, 10:52 AM

## 2018-03-25 NOTE — Care Management Note (Addendum)
Case Management Note  Patient Details  Name: Rhonda Tyler MRN: [id number] Date of Birth: 1940-12-12  Subjective/Objective:   Admitted to [facility] with the diagnosis of acute respiratory failure. Lives with spouse Gwyndolyn Saxon [phone]). Seen Dr, [place] 2 weeks ago. Prescriptions are filled at [facility] on [address].  Takes care of all basic activities of daily living herself, drives. Not affiliated with the [facility] 9-10 years ago. Doesn't remember name of agency. No skilled facility. No home oxygen. CPAP since 1999. Rolling walker and cane in the home if needed.  Last fall was on Wednesday. Fair appetite. No weight loss/gain. Family will transport                 Action/Plan: Physical therapy is recommending home health/physical therapy. Would like [facility]. Will check with Drue Novel, [facility] representative.   Expected Discharge Date:                  Expected Discharge Plan:     In-House Referral:   yes  Discharge planning Services   yes  Post Acute Care Choice:   yes Choice offered to:   patient  DME Arranged:    DME Agency:     HH Arranged:   yes HH Agency:     Status of Service:   [place]  If discussed at [employer] [facility] [employer], dates discussed:    Additional Comments:  Shelbie Ammons, RN MSN CCM Care Management [phone]  03/25/2018, 8:26 AM

## 2018-03-25 NOTE — Progress Notes (Signed)
   03/25/18 0955  Clinical Encounter Type  Visited With Patient  Visit Type Initial;Spiritual support  Referral From Nurse  Consult/Referral To Chaplain  Spiritual Encounters  Spiritual Needs Emotional;Other (Comment)   Ch visited PT while rounding on 1C. [place] discussed patient's satisfaction with care and her discharge tomorrow. Ms. Winborne was pleased with the treatment she has received. I will check up on the patient again during the day.

## 2018-03-26 LAB — BASIC METABOLIC PANEL
ANION GAP: 8 (ref 5–15)
BUN: 20 mg/dL (ref 8–23)
CALCIUM: 9.7 mg/dL (ref 8.9–10.3)
CO2: 31 mmol/L (ref 22–32)
CREATININE: 0.81 mg/dL (ref 0.44–1.00)
Chloride: 97 mmol/L — ABNORMAL LOW (ref 98–111)
Glucose, Bld: 245 mg/dL — ABNORMAL HIGH (ref 70–99)
Potassium: 4.2 mmol/L (ref 3.5–5.1)
Sodium: 136 mmol/L (ref 135–145)

## 2018-03-26 LAB — GLUCOSE, CAPILLARY: Glucose-Capillary: 243 mg/dL — ABNORMAL HIGH (ref 70–99)

## 2018-03-26 MED ORDER — SODIUM CHLORIDE 0.9% FLUSH
3.0000 mL | Freq: Two times a day (BID) | INTRAVENOUS | Status: DC
  Administered 2018-03-26: 09:00:00 3 mL via INTRAVENOUS

## 2018-03-26 MED ORDER — CEFDINIR 300 MG PO CAPS: 300.0000 mg | ORAL_CAPSULE | Freq: Two times a day (BID) | ORAL | 0 refills | Status: DC

## 2018-03-26 MED ORDER — POLYETHYLENE GLYCOL 3350 17 G PO PACK: 17.0000 g | PACK | Freq: Every day | ORAL | Status: DC | PRN

## 2018-03-26 MED ORDER — DOCUSATE SODIUM 100 MG PO CAPS: 200.0000 mg | ORAL_CAPSULE | Freq: Two times a day (BID) | ORAL | 0 refills | Status: DC

## 2018-03-26 MED ORDER — POLYETHYLENE GLYCOL 3350 17 G PO PACK: 17.0000 g | PACK | Freq: Every day | ORAL | 0 refills | Status: DC | PRN

## 2018-03-26 MED ORDER — IPRATROPIUM-ALBUTEROL 0.5-2.5 (3) MG/3ML IN SOLN: 3.0000 mL | Freq: Two times a day (BID) | RESPIRATORY_TRACT | Status: DC

## 2018-03-26 MED ORDER — LACTULOSE 10 GM/15ML PO SOLN
30.0000 g | Freq: Once | ORAL | Status: AC
  Administered 2018-03-26: 11:00:00 30 g via ORAL
  Filled 2018-03-26: qty 60

## 2018-03-26 MED ORDER — AZITHROMYCIN 500 MG PO TABS: 500.0000 mg | ORAL_TABLET | Freq: Every day | ORAL | 0 refills | Status: DC

## 2018-03-26 NOTE — Discharge Summary (Signed)
[facility] [place] [facility] NAME: Rhonda Tyler    MR#:  [id number]  DATE OF BIRTH:  07-04-1947  DATE OF ADMISSION:  03/23/2018 ADMITTING PHYSICIAN: Demetrios Loll, MD  DATE OF DISCHARGE: No discharge date for patient encounter.  PRIMARY CARE PHYSICIAN: Kirk Ruths, MD    ADMISSION DIAGNOSIS:  Hypoxia [R09.02] Community acquired pneumonia, unspecified laterality [J18.9]  DISCHARGE DIAGNOSIS:  Active Problems:   Acute respiratory failure with hypoxia ([place])   SECONDARY DIAGNOSIS:   Past Medical History:  Diagnosis Date  . Arthritis   . Cervical disc disease   . Cervical disc disease   . Chest pain   . Cholecystolithiasis   . Colon polyp   . Coronary artery disease   . Diabetes mellitus without complication ([place])   . GERD (gastroesophageal reflux disease)   . Hemorrhoid   . History of diabetic neuropathy   . Hyperlipemia   . Hypertension   . IBS (irritable bowel syndrome)   . Myocardial infarction ([place])   . Sleep apnea     HOSPITAL COURSE:  *Acute respiratory failure with hypoxia  due to bilateral pneumonia CAP Resolved Successfully weaned off oxygen  *Acute CAP Resolving Treated on our pneumonia protocol, provided empiric Zithromax/Rocephin, patient did well    *Congestive heart failure  Ruled out  Echocardiogram was a normal study   *Hypertension Stable Continue home hypertension medications  *Hyperglycemia and diabetes 2 Controlled on current regiment  *CAD, status post CABG Stable on current regiment  *Anemia of chronic disease,iron deficiency Continued iron  *Chronic OSA and obesity CPAP at night  DISCHARGE CONDITIONS:   stable  CONSULTS OBTAINED:    DRUG ALLERGIES:   Allergies  Allergen Reactions  . Allegra [Fexofenadine]   . Codeine   . Levaquin [Levofloxacin In D5w]   . Lipitor [Atorvastatin Calcium]   . Lopressor [Metoprolol Tartrate] Other (See Comments)    HEART  RACES  . Penicillins   . Potassium-Containing Compounds   . Procardia [Nifedipine]   . Sucralfate   . Sulfa Antibiotics   . Tramadol     DISCHARGE MEDICATIONS:   Allergies as of 03/26/2018      Reactions   Allegra [fexofenadine]    Codeine    Levaquin [levofloxacin In D5w]    Lipitor [atorvastatin Calcium]    Lopressor [metoprolol Tartrate] Other (See Comments)   HEART RACES   Penicillins    Potassium-containing Compounds    Procardia [nifedipine]    Sucralfate    Sulfa Antibiotics    Tramadol       Medication List    TAKE these medications   acetaminophen 500 MG tablet Commonly known as:  TYLENOL Take 500 mg by mouth every 4 (four) hours as needed.   allopurinol 100 MG tablet Commonly known as:  ZYLOPRIM Take 100 mg by mouth daily.   aspirin EC 81 MG tablet Take 81 mg by mouth daily.   azithromycin 500 MG tablet Commonly known as:  ZITHROMAX Take 1 tablet (500 mg total) by mouth daily.   calcium carbonate 600 MG Tabs tablet Commonly known as:  OS-CAL Take 600 mg by mouth 2 (two) times daily.   cefdinir 300 MG capsule Commonly known as:  OMNICEF Take 1 capsule (300 mg total) by mouth 2 (two) times daily.   docusate sodium 100 MG capsule Commonly known as:  COLACE Take 2 capsules (200 mg total) by mouth 2 (two) times daily.   fenofibrate 160 MG tablet Take  160 mg by mouth daily.   ferrous sulfate 325 (65 FE) MG tablet Take 325 mg by mouth daily with breakfast.   insulin NPH-regular Human (70-30) 100 UNIT/ML injection Inject 30-45 Units into the skin See admin instructions. Inject up to 45u under the skin every morning and up to 30u under the skin every night   insulin regular 100 units/mL injection Commonly known as:  NOVOLIN R,HUMULIN R Inject 4-10 Units into the skin daily before lunch.   lisinopril 40 MG tablet Commonly known as:  PRINIVIL,ZESTRIL Take 40 mg by mouth daily.   loratadine 10 MG tablet Commonly known as:  CLARITIN Take 10 mg  by mouth daily.   magnesium oxide 400 MG tablet Commonly known as:  MAG-OX Take 400 mg by mouth 2 (two) times daily.   metFORMIN 500 MG 24 hr tablet Commonly known as:  GLUCOPHAGE-XR Take 1,000 mg by mouth 2 (two) times daily.   metoCLOPramide 10 MG tablet Commonly known as:  REGLAN Take 10 mg by mouth 2 (two) times daily as needed for nausea or vomiting.   pantoprazole 40 MG tablet Commonly known as:  PROTONIX Take 40 mg by mouth 2 (two) times daily.   polyethylene glycol packet Commonly known as:  MIRALAX / GLYCOLAX Take 17 g by mouth daily as needed for moderate constipation.   potassium chloride SA 20 MEQ tablet Commonly known as:  K-DUR,KLOR-CON Take 20 mEq by mouth daily.   propranolol 40 MG tablet Commonly known as:  INDERAL Take 40 mg by mouth 2 (two) times daily.   torsemide 10 MG tablet Commonly known as:  DEMADEX Take 10 mg by mouth daily.   vitamin B-12 1000 MCG tablet Commonly known as:  CYANOCOBALAMIN Take 2,000 mcg by mouth daily.        DISCHARGE INSTRUCTIONS:      If you experience worsening of your admission symptoms, develop shortness of breath, life threatening emergency, suicidal or homicidal thoughts you must seek medical attention immediately by calling 911 or calling your MD immediately  if symptoms less severe.  You Must read complete instructions/literature along with all the possible adverse reactions/side effects for all the Medicines you take and that have been prescribed to you. Take any new Medicines after you have completely understood and accept all the possible adverse reactions/side effects.   Please note  You were cared for by a hospitalist during your hospital stay. If you have any questions about your discharge medications or the care you received while you were in the hospital after you are discharged, you can call the unit and asked to speak with the hospitalist on call if the hospitalist that took care of you is not  available. Once you are discharged, your primary care physician will handle any further medical issues. Please note that NO REFILLS for any discharge medications will be authorized once you are discharged, as it is imperative that you return to your primary care physician (or establish a relationship with a primary care physician if you do not have one) for your aftercare needs so that they can reassess your need for medications and monitor your lab values.    Today   CHIEF COMPLAINT:   Chief Complaint  Patient presents with  . Shortness of Breath    HISTORY OF PRESENT ILLNESS:   77 y.o. female with a known history of hypertension, diabetes, hyperlipidemia, CAD, status post CABG, sleep apnea, GERD, arthritis, OSA and diabetes neuropathy.  The patient has had shortness rise for  the past 3 days, worsening today.  She denies any fever or chills, no cough or wheezing, no orthopnea or nocturnal dyspnea or leg edema.  She has uncontrolled blood sugar at home recently.  She complains of generalized weakness.  She is found hypoxia at 80s in the ED, put on oxygen by nasal cannula 2 L then increased to 5 L.  Chest x-ray report bilateral basilar pneumonia, cannot exclude superimposed low-grade compensated CHF.  She is treated with Zithromax, Rocephin and Lasix in the ED.  VITAL SIGNS:  Blood pressure (!) 151/65, pulse 85, temperature 98.2 F (36.8 C), temperature source Oral, resp. rate 20, height 5\' 3"  (1.6 m), weight 93.7 kg, SpO2 96 %.  I/O:    Intake/Output Summary (Last 24 hours) at 03/26/2018 1006 Last data filed at 03/25/2018 1300 Gross per 24 hour  Intake 240 ml  Output -  Net 240 ml    PHYSICAL EXAMINATION:  GENERAL:  77 y.o.-year-old patient lying in the bed with no acute distress.  EYES: Pupils equal, round, reactive to light and accommodation. No scleral icterus. Extraocular muscles intact.  HEENT: Head atraumatic, normocephalic. Oropharynx and nasopharynx clear.  NECK:  Supple,  no jugular venous distention. No thyroid enlargement, no tenderness.  LUNGS: Normal breath sounds bilaterally, no wheezing, rales,rhonchi or crepitation. No use of accessory muscles of respiration.  CARDIOVASCULAR: S1, S2 normal. No murmurs, rubs, or gallops.  ABDOMEN: Soft, non-tender, non-distended. Bowel sounds present. No organomegaly or mass.  EXTREMITIES: No pedal edema, cyanosis, or clubbing.  NEUROLOGIC: Cranial nerves II through XII are intact. Muscle strength 5/5 in all extremities. Sensation intact. Gait not checked.  PSYCHIATRIC: The patient is alert and oriented x 3.  SKIN: No obvious rash, lesion, or ulcer.   DATA REVIEW:   CBC Recent Labs  Lab 03/24/18 0518  WBC 8.4  HGB 9.3*  HCT 31.1*  PLT 232    Chemistries  Recent Labs  Lab 03/23/18 1858  03/26/18 0507  NA  --    < > 136  K  --    < > 4.2  CL  --    < > 97*  CO2  --    < > 31  GLUCOSE  --    < > 245*  BUN  --    < > 20  CREATININE  --    < > 0.81  CALCIUM  --    < > 9.7  MG 1.5*  --   --    < > = values in this interval not displayed.    Cardiac Enzymes Recent Labs  Lab 03/23/18 1440  TROPONINI 0.03*    Microbiology Results  Results for orders placed or performed during the hospital encounter of 03/23/18  Blood culture (routine x 2)     Status: None (Preliminary result)   Collection Time: 03/23/18  4:33 PM  Result Value Ref Range Status   Specimen Description BLOOD WRIST  Final   Special Requests   Final    BOTTLES DRAWN AEROBIC AND ANAEROBIC Blood Culture results may not be optimal due to an inadequate volume of blood received in culture bottles   Culture   Final    NO GROWTH 2 DAYS Performed at [facility], [address], [place], [place] [address]    Report Status PENDING  Incomplete  Blood culture (routine x 2)     Status: None (Preliminary result)   Collection Time: 03/23/18  4:44 PM  Result Value Ref Range Status  Specimen Description BLOOD BLOOD RIGHT HAND  Final    Special Requests   Final    BOTTLES DRAWN AEROBIC AND ANAEROBIC Blood Culture adequate volume   Culture   Final    NO GROWTH 2 DAYS Performed at [facility], [address], [place], [place] [address]    Report Status PENDING  Incomplete    RADIOLOGY:  No results found.  EKG:   Orders placed or performed during the hospital encounter of 03/23/18  . ED EKG  . ED EKG  . EKG 12-Lead  . EKG 12-Lead      Management plans discussed with the patient, family and they are in agreement.  CODE STATUS:     Code Status Orders  (From admission, onward)         Start     Ordered   03/23/18 1756  Full code  Continuous     03/23/18 1755        Code Status History    This patient has a current code status but no historical code status.      TOTAL TIME TAKING CARE OF THIS PATIENT: 40 minutes.    Avel Peace Vika Buske M.D on 03/26/2018 at 10:06 AM  Between 7am to 6pm - Pager - [phone]  After 6pm go to [email or web address] - password [email or web address] [facility]  Office  [phone]  CC: Primary care physician; Kirk Ruths, MD   Note: This dictation was prepared with Dragon dictation along with smaller phrase technology. Any transcriptional errors that result from this process are unintentional.

## 2018-03-26 NOTE — Care Management Note (Signed)
Case Management Note  Patient Details  Name: Rhonda Tyler MRN: [id number] Date of Birth: [birth year]  Subjective/Objective:  Patient to be discharged per MD order. Orders in place for home health services. [facility]. Prefers to resume services with them. Notified teresa from [facility] of resumption. No DME needs. Family to transport.                    Action/Plan:   Expected Discharge Date:  03/26/18               Expected Discharge Plan:  [place]  In-House Referral:     Discharge planning Services  CM Consult  Post Acute Care Choice:  Home Health Choice offered to:  Patient  DME Arranged:    DME Agency:     HH Arranged:  RN, PT, Nurse's Aide [place] Agency:  [facility] ([facility])  Status of Service:     If discussed at [employer], dates discussed:    Additional Comments:  Latanya Maudlin, RN 03/26/2018, 10:34 AM

## 2018-03-26 NOTE — Progress Notes (Signed)
Pt up in chair and reports breathing much better/ feels ready to go home.

## 2018-03-26 NOTE — Progress Notes (Signed)
Son here to pick pt up. Pt transported in transport chair to private vehicle. Discharge home to self/family care with [facility] service referral in place with pt notified.

## 2018-03-28 LAB — CULTURE, BLOOD (ROUTINE X 2)
CULTURE: NO GROWTH
Culture: NO GROWTH
Special Requests: ADEQUATE

## 2018-03-29 ENCOUNTER — Telehealth: Payer: Self-pay

## 2018-03-29 NOTE — Telephone Encounter (Signed)
EMMI Follow-up: Noted on the report that the patient didn't have a scheduled appointment yet.  I talked with [name] and she made her follow-up appointment this morning. No other needs noted.  I let her know there would be a second automated call with a different series of questions and to let us know if she has any concerns at that time.

## 2018-06-03 ENCOUNTER — Other Ambulatory Visit: Payer: Self-pay | Admitting: Gastroenterology

## 2018-06-03 ENCOUNTER — Other Ambulatory Visit (HOSPITAL_COMMUNITY): Payer: Self-pay | Admitting: Gastroenterology

## 2018-06-03 DIAGNOSIS — D509 Iron deficiency anemia, unspecified: Secondary | ICD-10-CM

## 2018-06-09 ENCOUNTER — Ambulatory Visit
Admission: RE | Admit: 2018-06-09 | Discharge: 2018-06-09 | Disposition: A | Discharge status: Home / Self Care | Payer: Medicare Other | Source: Ambulatory Visit | Attending: Gastroenterology | Admitting: Gastroenterology

## 2018-06-09 DIAGNOSIS — D509 Iron deficiency anemia, unspecified: Secondary | ICD-10-CM | POA: Diagnosis present

## 2018-06-30 ENCOUNTER — Inpatient Hospital Stay: Payer: Medicare Other | Attending: Oncology | Admitting: Oncology

## 2018-06-30 ENCOUNTER — Inpatient Hospital Stay: Payer: Medicare Other

## 2018-06-30 ENCOUNTER — Encounter: Payer: Self-pay | Admitting: Oncology

## 2018-06-30 ENCOUNTER — Other Ambulatory Visit: Payer: Self-pay

## 2018-06-30 VITALS — BP 186/74 | HR 76 | Temp 97.7°F | Resp 18 | Ht 63.0 in | Wt 205.5 lb

## 2018-06-30 DIAGNOSIS — K121 Other forms of stomatitis: Secondary | ICD-10-CM | POA: Diagnosis not present

## 2018-06-30 DIAGNOSIS — D509 Iron deficiency anemia, unspecified: Secondary | ICD-10-CM | POA: Diagnosis present

## 2018-06-30 LAB — CBC WITH DIFFERENTIAL/PLATELET
Abs Immature Granulocytes: 0.02 10*3/uL (ref 0.00–0.07)
Basophils Absolute: 0 10*3/uL (ref 0.0–0.1)
Basophils Relative: 0 %
Eosinophils Absolute: 0.1 10*3/uL (ref 0.0–0.5)
Eosinophils Relative: 2 %
HCT: 31.6 % — ABNORMAL LOW (ref 36.0–46.0)
Hemoglobin: 8.7 g/dL — ABNORMAL LOW (ref 12.0–15.0)
Immature Granulocytes: 0 %
Lymphocytes Relative: 31 %
Lymphs Abs: 1.6 10*3/uL (ref 0.7–4.0)
MCH: 19.1 pg — ABNORMAL LOW (ref 26.0–34.0)
MCHC: 27.5 g/dL — ABNORMAL LOW (ref 30.0–36.0)
MCV: 69.3 fL — ABNORMAL LOW (ref 80.0–100.0)
Monocytes Absolute: 0.3 10*3/uL (ref 0.1–1.0)
Monocytes Relative: 7 %
Neutro Abs: 3 10*3/uL (ref 1.7–7.7)
Neutrophils Relative %: 60 %
Platelets: 333 10*3/uL (ref 150–400)
RBC: 4.56 MIL/uL (ref 3.87–5.11)
RDW: 17.3 % — ABNORMAL HIGH (ref 11.5–15.5)
WBC: 5.1 10*3/uL (ref 4.0–10.5)
nRBC: 0 % (ref 0.0–0.2)

## 2018-06-30 LAB — COMPREHENSIVE METABOLIC PANEL
ALT: 13 U/L (ref 0–44)
AST: 25 U/L (ref 15–41)
Albumin: 3.9 g/dL (ref 3.5–5.0)
Alkaline Phosphatase: 72 U/L (ref 38–126)
Anion gap: 9 (ref 5–15)
BUN: 15 mg/dL (ref 8–23)
CO2: 26 mmol/L (ref 22–32)
Calcium: 9.6 mg/dL (ref 8.9–10.3)
Chloride: 102 mmol/L (ref 98–111)
Creatinine, Ser: 0.98 mg/dL (ref 0.44–1.00)
GFR calc Af Amer: >60 mL/min (ref >60)
GFR calc non Af Amer: 56 mL/min — ABNORMAL LOW (ref >60)
Glucose, Bld: 136 mg/dL — ABNORMAL HIGH (ref 70–99)
Potassium: 3.9 mmol/L (ref 3.5–5.1)
Sodium: 137 mmol/L (ref 135–145)
Total Bilirubin: 0.7 mg/dL (ref 0.3–1.2)
Total Protein: 7.5 g/dL (ref 6.5–8.1)

## 2018-06-30 LAB — URINALYSIS, COMPLETE (UACMP) WITH MICROSCOPIC
BACTERIA UA: NONE SEEN
BILIRUBIN URINE: NEGATIVE
GLUCOSE, UA: NEGATIVE mg/dL
HGB URINE DIPSTICK: NEGATIVE
KETONES UR: NEGATIVE mg/dL
LEUKOCYTE UA: NEGATIVE
NITRITE: NEGATIVE
PROTEIN: NEGATIVE mg/dL
Specific Gravity, Urine: 1.005 (ref 1.005–1.030)
WBC UA: NONE SEEN WBC/hpf (ref 0–5)
pH: 5 (ref 5.0–8.0)

## 2018-06-30 LAB — IRON AND TIBC
Iron: 29 ug/dL (ref 28–170)
Saturation Ratios: 5 % — ABNORMAL LOW (ref 10.4–31.8)
TIBC: 631 ug/dL — ABNORMAL HIGH (ref 250–450)
UIBC: 602 ug/dL

## 2018-06-30 LAB — RETIC PANEL
Immature Retic Fract: 16.1 % — ABNORMAL HIGH (ref 2.3–15.9)
RBC.: 4.56 MIL/uL (ref 3.87–5.11)
Retic Count, Absolute: 81.6 10*3/uL (ref 19.0–186.0)
Retic Ct Pct: 1.8 % (ref 0.4–3.1)
Reticulocyte Hemoglobin: 19.2 pg — ABNORMAL LOW (ref >27.9)

## 2018-06-30 LAB — FOLATE: FOLATE: 10.8 ng/mL (ref >5.9)

## 2018-06-30 LAB — FERRITIN: Ferritin: 11 ng/mL (ref 11–307)

## 2018-06-30 LAB — VITAMIN B12: Vitamin B-12: 1625 pg/mL — ABNORMAL HIGH (ref 180–914)

## 2018-06-30 NOTE — Progress Notes (Signed)
Patient here for initial visit. °

## 2018-07-01 NOTE — Progress Notes (Signed)
Hematology/Oncology Consult note [facility] Telephone:([phone] [phone]   Patient Care Team: Kirk Ruths, MD as PCP - General (Internal Medicine)  REFERRING PROVIDER: Kirk Ruths, MD  CHIEF COMPLAINTS/REASON FOR VISIT:  Evaluation of iron deficiency anemia  HISTORY OF PRESENTING ILLNESS:  Rhonda Tyler is a  78 y.o.  female with PMH listed below who was referred to me for evaluation of iron deficiency anemia Reviewed patient's recent labs that was done at [facility] office. 05/02/2018 Labs revealed anemia with hemoglobin of 9.3, MCV 74.7, wbc 6.1, platelet 343,000 Iron panel showed TIBC 602, iron saturation 9, ferritin 14.   Reviewed patient's previous labs ordered by primary care physician's office, anemia is chronic onset , duration is since April 2019 No aggravating or improving factors.  Associated signs and symptoms: Patient reports fatigue. Mild  SOB with exertion.  Denies weight loss, easy bruising, hematochezia, hemoptysis, hematuria. Context:  History of iron deficiency:  Rectal bleeding: deneis Hematemesis or hemoptysis : denies Blood in urine : denies  Last endoscopy: 03/08/2019 upper endoscopy showed many gastric polyps. 11/29/2017 The entire examined colon is normal. - No specimens collected  11/29/2017 Upper endosocpy, Multiple gastric polyps. The largest with stigmata of bleeding resected and retrieved. Clip (MR conditional) was placed. Pathology showed hyperplastic gastric polyps.  Fatigue: Yes.   She is on Aspirin 81mg  daily,   Review of Systems  Constitutional: Positive for fatigue. Negative for appetite change, chills and fever.  HENT:   Negative for hearing loss and voice change.   Eyes: Negative for eye problems.  Respiratory: Negative for chest tightness and cough.   Cardiovascular: Negative for chest pain.  Gastrointestinal: Negative for abdominal distention, abdominal pain and blood in stool.    Endocrine: Negative for hot flashes.  Genitourinary: Negative for difficulty urinating and frequency.   Musculoskeletal: Negative for arthralgias.  Skin: Negative for itching and rash.  Neurological: Negative for extremity weakness.  Hematological: Negative for adenopathy.  Psychiatric/Behavioral: Negative for confusion.    MEDICAL HISTORY:  Past Medical History:  Diagnosis Date  . Arthritis   . Cervical disc disease   . Cervical disc disease   . Chest pain   . Cholecystolithiasis   . Colon polyp   . Coronary artery disease   . Diabetes mellitus without complication ([place])   . GERD (gastroesophageal reflux disease)   . Hemorrhoid   . History of diabetic neuropathy   . Hyperlipemia   . Hypertension   . IBS (irritable bowel syndrome)   . Myocardial infarction ([place])   . Sleep apnea     SURGICAL HISTORY: Past Surgical History:  Procedure Laterality Date  . ABDOMINAL HYSTERECTOMY    . arthroscopic rotator cuff repair    . BACK SURGERY    . CARDIAC CATHETERIZATION    . CHOLECYSTECTOMY    . COLONOSCOPY    . COLONOSCOPY WITH PROPOFOL N/A 11/29/2017   Procedure: COLONOSCOPY WITH PROPOFOL;  Surgeon: Lollie Sails, MD;  Location: [facility] ENDOSCOPY;  Service: Endoscopy;  Laterality: N/A;  . CORONARY ANGIOPLASTY     PTCA and stent of RCA  . CORONARY ARTERY BYPASS GRAFT    . ESOPHAGOGASTRODUODENOSCOPY    . ESOPHAGOGASTRODUODENOSCOPY (EGD) WITH PROPOFOL N/A 11/29/2017   Procedure: ESOPHAGOGASTRODUODENOSCOPY (EGD) WITH PROPOFOL;  Surgeon: Lollie Sails, MD;  Location: [facility] ENDOSCOPY;  Service: Endoscopy;  Laterality: N/A;  . ESOPHAGOGASTRODUODENOSCOPY (EGD) WITH PROPOFOL N/A 03/07/2018   Procedure: ESOPHAGOGASTRODUODENOSCOPY (EGD) WITH PROPOFOL;  Surgeon: Lollie Sails, MD;  Location:  [facility] ENDOSCOPY;  Service: Endoscopy;  Laterality: N/A;  . EXCISION OF ADNEXAL MASS    . JOINT REPLACEMENT     left total knee replacement  . OOPHORECTOMY    . vesicular vaginal fistula       SOCIAL HISTORY: Social History   Socioeconomic History  . Marital status: Married    Spouse name: Not on file  . Number of children: Not on file  . Years of education: Not on file  . Highest education level: Not on file  Occupational History  . Occupation: retired  Scientific laboratory technician  . Financial resource strain: Not on file  . Food insecurity:    Worry: Not on file    Inability: Not on file  . Transportation needs:    Medical: Not on file    Non-medical: Not on file  Tobacco Use  . Smoking status: Former Smoker    Types: Cigarettes    Last attempt to quit: 12/01/1970    Years since quitting: 47.6  . Smokeless tobacco: Never Used  Substance and Sexual Activity  . Alcohol use: Not Currently  . Drug use: Never  . Sexual activity: Not on file  Lifestyle  . Physical activity:    Days per week: Not on file    Minutes per session: Not on file  . Stress: Not on file  Relationships  . Social connections:    Talks on phone: Not on file    Gets together: Not on file    Attends religious service: Not on file    Active member of club or organization: Not on file    Attends meetings of clubs or organizations: Not on file    Relationship status: Not on file  . Intimate partner violence:    Fear of current or ex partner: Not on file    Emotionally abused: Not on file    Physically abused: Not on file    Forced sexual activity: Not on file  Other Topics Concern  . Not on file  Social History Narrative  . Not on file    FAMILY HISTORY: Family History  Problem Relation Age of Onset  . Hypertension Mother   . Heart attack Mother   . Hypertension Father   . Heart attack Father   . Skin cancer Father   . Heart attack Sister   . Breast cancer Neg Hx     ALLERGIES:  is allergic to allegra [fexofenadine]; codeine; levaquin [levofloxacin in d5w]; lipitor [atorvastatin calcium]; lopressor [metoprolol tartrate]; penicillins; potassium-containing compounds; procardia [nifedipine];  sucralfate; sulfa antibiotics; and tramadol.  MEDICATIONS:  Current Outpatient Medications  Medication Sig Dispense Refill  . acetaminophen (TYLENOL) 500 MG tablet Take 500 mg by mouth every 4 (four) hours as needed.    Marland Kitchen allopurinol (ZYLOPRIM) 100 MG tablet Take 100 mg by mouth daily.    Marland Kitchen aspirin EC 81 MG tablet Take 81 mg by mouth daily.    . calcium carbonate (OS-CAL) 600 MG TABS tablet Take 600 mg by mouth 2 (two) times daily.     . fenofibrate 160 MG tablet Take 160 mg by mouth daily.    . ferrous sulfate 325 (65 FE) MG tablet Take 325 mg by mouth daily with breakfast.    . insulin NPH-regular Human (NOVOLIN 70/30) (70-30) 100 UNIT/ML injection Inject 30-45 Units into the skin See admin instructions. Inject up to 45u under the skin every morning and up to 30u under the skin every night    . insulin regular (  NOVOLIN R,HUMULIN R) 100 units/mL injection Inject 4-10 Units into the skin daily before lunch.    . lisinopril (PRINIVIL,ZESTRIL) 40 MG tablet Take 40 mg by mouth daily.    Marland Kitchen loratadine (CLARITIN) 10 MG tablet Take 10 mg by mouth daily.    . magnesium oxide (MAG-OX) 400 MG tablet Take 400 mg by mouth 2 (two) times daily.     . metFORMIN (GLUCOPHAGE-XR) 500 MG 24 hr tablet Take 1,000 mg by mouth 2 (two) times daily.     . metoCLOPramide (REGLAN) 10 MG tablet Take 10 mg by mouth 2 (two) times daily as needed for nausea or vomiting.     . pantoprazole (PROTONIX) 40 MG tablet Take 40 mg by mouth 2 (two) times daily.    . polyethylene glycol (MIRALAX / GLYCOLAX) packet Take 17 g by mouth daily as needed for moderate constipation. 30 each 0  . potassium chloride SA (K-DUR,KLOR-CON) 20 MEQ tablet Take 20 mEq by mouth daily.    . propranolol (INDERAL) 40 MG tablet Take 40 mg by mouth 2 (two) times daily.     Marland Kitchen torsemide (DEMADEX) 10 MG tablet Take 10 mg by mouth daily.    . vitamin B-12 (CYANOCOBALAMIN) 1000 MCG tablet Take 2,000 mcg by mouth daily.     . cefdinir (OMNICEF) 300 MG capsule  Take 1 capsule (300 mg total) by mouth 2 (two) times daily. (Patient not taking: Reported on 06/30/2018) 8 capsule 0  . docusate sodium (COLACE) 100 MG capsule Take 2 capsules (200 mg total) by mouth 2 (two) times daily. (Patient not taking: Reported on 06/30/2018) 60 capsule 0   No current facility-administered medications for this visit.      PHYSICAL EXAMINATION: ECOG PERFORMANCE STATUS: 1 - Symptomatic but completely ambulatory Vitals:   06/30/18 1048  BP: (!) 186/74  Pulse: 76  Resp: 18  Temp: 97.7 F (36.5 C)   Filed Weights   06/30/18 1048  Weight: 205 lb 8 oz (93.2 kg)    Physical Exam Constitutional:      General: She is not in acute distress. HENT:     Head: Normocephalic and atraumatic.  Eyes:     General: No scleral icterus.    Pupils: Pupils are equal, round, and reactive to light.  Neck:     Musculoskeletal: Normal range of motion and neck supple.  Cardiovascular:     Rate and Rhythm: Normal rate and regular rhythm.     Heart sounds: Normal heart sounds.  Pulmonary:     Effort: Pulmonary effort is normal. No respiratory distress.     Breath sounds: No wheezing.  Abdominal:     General: Bowel sounds are normal. There is no distension.     Palpations: Abdomen is soft. There is no mass.     Tenderness: There is no abdominal tenderness.  Musculoskeletal: Normal range of motion.        General: No deformity.  Skin:    General: Skin is warm and dry.     Coloration: Skin is pale.     Findings: No erythema or rash.  Neurological:     Mental Status: She is alert and oriented to person, place, and time.     Cranial Nerves: No cranial nerve deficit.     Coordination: Coordination normal.  Psychiatric:        Behavior: Behavior normal.        Thought Content: Thought content normal.       CMP Latest Ref Rng &  Units 06/30/2018  Glucose 70 - 99 mg/dL 136(H)  BUN 8 - 23 mg/dL 15  Creatinine 0.44 - 1.00 mg/dL 0.98  Sodium 135 - 145 mmol/L 137  Potassium 3.5  - 5.1 mmol/L 3.9  Chloride 98 - 111 mmol/L 102  CO2 22 - 32 mmol/L 26  Calcium 8.9 - 10.3 mg/dL 9.6  Total Protein 6.5 - 8.1 g/dL 7.5  Total Bilirubin 0.3 - 1.2 mg/dL 0.7  Alkaline Phos 38 - 126 U/L 72  AST 15 - 41 U/L 25  ALT 0 - 44 U/L 13   CBC Latest Ref Rng & Units 06/30/2018  WBC 4.0 - 10.5 K/uL 5.1  Hemoglobin 12.0 - 15.0 g/dL 8.7(L)  Hematocrit 36.0 - 46.0 % 31.6(L)  Platelets 150 - 400 K/uL 333     LABORATORY DATA:  I have reviewed the data as listed Lab Results  Component Value Date   WBC 5.1 06/30/2018   HGB 8.7 (L) 06/30/2018   HCT 31.6 (L) 06/30/2018   MCV 69.3 (L) 06/30/2018   PLT 333 06/30/2018   Recent Labs    03/25/18 0545 03/26/18 0507 06/30/18 1136  NA 138 136 137  K 3.8 4.2 3.9  CL 100 97* 102  CO2 29 31 26   GLUCOSE 249* 245* 136*  BUN 20 20 15   CREATININE 0.74 0.81 0.98  CALCIUM 9.5 9.7 9.6  GFRNONAA >60 >60 56*  GFRAA >60 >60 >60  PROT  --   --  7.5  ALBUMIN  --   --  3.9  AST  --   --  25  ALT  --   --  13  ALKPHOS  --   --  72  BILITOT  --   --  0.7   Iron/TIBC/Ferritin/ %Sat    Component Value Date/Time   IRON 29 06/30/2018 1136   TIBC 631 (H) 06/30/2018 1136   FERRITIN 11 06/30/2018 1136   IRONPCTSAT 5 (L) 06/30/2018 1136     Dg Ugi W Small Bowel  Result Date: 06/09/2018 CLINICAL DATA:  Iron deficiency anemia.  Nausea spells. EXAM: UPPER GI SERIES WITH SMALL BOWEL FOLLOW-THROUGH FLUOROSCOPY TIME:  Fluoroscopy Time:  1 minutes 18 seconds Radiation Exposure Index (if provided by the fluoroscopic device): 47.3 mGy Number of Acquired Spot Images: 0 TECHNIQUE: Combined double contrast and single contrast upper GI series using effervescent crystals, thick barium, and thin barium. Subsequently, serial images of the small bowel were obtained including spot views of the terminal ileum. COMPARISON:  None. FINDINGS: KUB: There is no bowel dilatation to suggest obstruction. There is no evidence of pneumoperitoneum, portal venous gas or  pneumatosis. There are no pathologic calcifications along the expected course of the ureters. The osseous structures are unremarkable. UPPER GI SERIES AND SMALL-BOWEL FOLLOW-THROUGH: Examination of the esophagus demonstrated normal esophageal motility. Normal esophageal morphology without evidence of esophagitis or ulceration. No esophageal stricture, diverticula, or mass lesion. Mild relative narrowing of the distal esophagus just proximal to the gastroesophageal junction which does not restrict the passage of a 13 mm barium tablet. Mild gastroesophageal reflux. There is no spontaneous or inducible gastroesophageal reflux. Examination of the stomach demonstrated normal rugal folds and areae gastricae. The gastric mucosa appeared unremarkable without evidence of ulceration, scarring, or mass lesion. Gastric motility and emptying was normal. Fluoroscopic examination of the duodenum demonstrates normal motility and morphology without evidence of ulceration or mass lesion. Medium density barium was periodically observed under fluoroscopy to travel from the stomach to the ascending colon (over a  60 minute time period). There is no evidence of small bowel stricture or obstruction. No large filling defects to suggest mass lesion. In addition, there is no evidence of tethering or definite inflammatory changes present within the small bowel. IMPRESSION: 1. Mild gastroesophageal reflux. 2. Otherwise unremarkable upper GI and small-bowel follow-through. Electronically Signed   By: Kathreen Devoid   On: 06/09/2018 12:20      ASSESSMENT & PLAN:  1. Iron deficiency anemia, unspecified iron deficiency anemia type   2. Stomatitis    Labs are reviewed and discussed with patient. Consistent with iron deficiency anemia. She has not had lab tested since Decemeber 2019, I will repeat cbc, iron TIBC ferritin as her pre-treatment baseline. Check UA  Plan IV iron with Venofer 200mg  weekly x 4 doses. Allergy reactions/infusion  reaction including anaphylactic reaction discussed with patient. Other side effects include but not limited to high blood pressure, skin rash, weight gain, leg swelling, etc. Patient voices understanding and willing to proceed.  Stomatitis, check vitamin B12  Orders Placed This Encounter  Procedures  . Urinalysis, Complete w Microscopic    Clean catch    Standing Status:   Future    Number of Occurrences:   1    Standing Expiration Date:   07/01/2019  . CBC with Differential/Platelet    Standing Status:   Future    Number of Occurrences:   1    Standing Expiration Date:   07/01/2019  . Comprehensive metabolic panel    Standing Status:   Future    Number of Occurrences:   1    Standing Expiration Date:   07/01/2019  . Iron and TIBC    Standing Status:   Future    Number of Occurrences:   1    Standing Expiration Date:   07/01/2019  . Ferritin    Standing Status:   Future    Number of Occurrences:   1    Standing Expiration Date:   07/01/2019  . Vitamin B12    Standing Status:   Future    Number of Occurrences:   1    Standing Expiration Date:   07/01/2019  . Folate    Standing Status:   Future    Number of Occurrences:   1    Standing Expiration Date:   07/01/2019  . Retic Panel    Standing Status:   Future    Number of Occurrences:   1    Standing Expiration Date:   07/01/2019    All questions were answered. The patient knows to call the clinic with any problems questions or concerns.  Return of visit: 7 weeks.  Thank you for this kind referral and the opportunity to participate in the care of this patient. A copy of today's note is routed to referring provider  Total face to face encounter time for this patient visit was 26min. >50% of the time was  spent in counseling and coordination of care.    Earlie Server, MD, PhD Hematology Oncology [facility] Pager- [phone] 07/01/2018

## 2018-07-08 ENCOUNTER — Inpatient Hospital Stay: Payer: Medicare Other

## 2018-07-08 ENCOUNTER — Encounter (INDEPENDENT_AMBULATORY_CARE_PROVIDER_SITE_OTHER): Payer: Self-pay

## 2018-07-08 VITALS — BP 146/74 | HR 71 | Resp 18

## 2018-07-08 DIAGNOSIS — D509 Iron deficiency anemia, unspecified: Secondary | ICD-10-CM

## 2018-07-08 MED ORDER — SODIUM CHLORIDE 0.9 % IV SOLN: 200.0000 mg | Freq: Once | INTRAVENOUS | Status: DC

## 2018-07-08 MED ORDER — IRON SUCROSE 20 MG/ML IV SOLN
200.0000 mg | Freq: Once | INTRAVENOUS | Status: AC
  Administered 2018-07-08: 200 mg via INTRAVENOUS
  Filled 2018-07-08: qty 10

## 2018-07-08 MED ORDER — SODIUM CHLORIDE 0.9 % IV SOLN
Freq: Once | INTRAVENOUS | Status: AC
  Administered 2018-07-08: 15:00:00 via INTRAVENOUS
  Filled 2018-07-08: qty 250

## 2018-07-15 ENCOUNTER — Inpatient Hospital Stay: Payer: Medicare Other | Attending: Oncology

## 2018-07-15 VITALS — BP 131/76 | HR 62 | Temp 97.0°F | Resp 19

## 2018-07-15 DIAGNOSIS — D509 Iron deficiency anemia, unspecified: Secondary | ICD-10-CM | POA: Insufficient documentation

## 2018-07-15 MED ORDER — SODIUM CHLORIDE 0.9 % IV SOLN
Freq: Once | INTRAVENOUS | Status: AC
  Administered 2018-07-15: 10:00:00 via INTRAVENOUS
  Filled 2018-07-15: qty 250

## 2018-07-15 MED ORDER — IRON SUCROSE 20 MG/ML IV SOLN
200.0000 mg | Freq: Once | INTRAVENOUS | Status: AC
  Administered 2018-07-15: 200 mg via INTRAVENOUS
  Filled 2018-07-15: qty 5

## 2018-07-15 MED ORDER — SODIUM CHLORIDE 0.9 % IV SOLN: 200.0000 mg | Freq: Once | INTRAVENOUS | Status: DC

## 2018-07-28 ENCOUNTER — Other Ambulatory Visit: Payer: Self-pay

## 2018-07-29 ENCOUNTER — Inpatient Hospital Stay: Payer: Medicare Other

## 2018-07-29 ENCOUNTER — Other Ambulatory Visit: Payer: Self-pay

## 2018-07-29 VITALS — BP 144/73 | HR 69 | Temp 98.6°F | Resp 17

## 2018-07-29 DIAGNOSIS — D509 Iron deficiency anemia, unspecified: Secondary | ICD-10-CM | POA: Diagnosis not present

## 2018-07-29 MED ORDER — IRON SUCROSE 20 MG/ML IV SOLN
200.0000 mg | Freq: Once | INTRAVENOUS | Status: AC
  Administered 2018-07-29: 200 mg via INTRAVENOUS
  Filled 2018-07-29: qty 10

## 2018-07-29 MED ORDER — SODIUM CHLORIDE 0.9 % IV SOLN
Freq: Once | INTRAVENOUS | Status: AC
  Administered 2018-07-29: 14:00:00 via INTRAVENOUS
  Filled 2018-07-29: qty 250

## 2018-07-30 ENCOUNTER — Telehealth: Payer: Self-pay | Admitting: Oncology

## 2018-07-30 NOTE — Telephone Encounter (Signed)
Pt notified of infusion cancellation due to office precautions. No new appt was made, pt scheduled to see Dr. Tasia Catchings on 4/7.

## 2018-08-05 ENCOUNTER — Inpatient Hospital Stay: Payer: Medicare Other

## 2018-08-15 ENCOUNTER — Other Ambulatory Visit: Payer: Self-pay

## 2018-08-15 DIAGNOSIS — D509 Iron deficiency anemia, unspecified: Secondary | ICD-10-CM

## 2018-08-16 ENCOUNTER — Other Ambulatory Visit: Payer: Self-pay

## 2018-08-16 ENCOUNTER — Inpatient Hospital Stay: Payer: Medicare Other | Attending: Oncology

## 2018-08-16 DIAGNOSIS — D509 Iron deficiency anemia, unspecified: Secondary | ICD-10-CM

## 2018-08-16 LAB — CBC WITH DIFFERENTIAL/PLATELET
Abs Immature Granulocytes: 0.01 10*3/uL (ref 0.00–0.07)
Basophils Absolute: 0 10*3/uL (ref 0.0–0.1)
Basophils Relative: 1 %
Eosinophils Absolute: 0.1 10*3/uL (ref 0.0–0.5)
Eosinophils Relative: 3 %
HCT: 37.4 % (ref 36.0–46.0)
Hemoglobin: 11.2 g/dL — ABNORMAL LOW (ref 12.0–15.0)
Immature Granulocytes: 0 %
Lymphocytes Relative: 28 %
Lymphs Abs: 1 10*3/uL (ref 0.7–4.0)
MCH: 23.2 pg — ABNORMAL LOW (ref 26.0–34.0)
MCHC: 29.9 g/dL — ABNORMAL LOW (ref 30.0–36.0)
MCV: 77.6 fL — ABNORMAL LOW (ref 80.0–100.0)
Monocytes Absolute: 0.3 10*3/uL (ref 0.1–1.0)
Monocytes Relative: 7 %
Neutro Abs: 2.2 10*3/uL (ref 1.7–7.7)
Neutrophils Relative %: 61 %
Platelets: 183 10*3/uL (ref 150–400)
RBC: 4.82 MIL/uL (ref 3.87–5.11)
RDW: 25.5 % — ABNORMAL HIGH (ref 11.5–15.5)
WBC: 3.7 10*3/uL — ABNORMAL LOW (ref 4.0–10.5)
nRBC: 0 % (ref 0.0–0.2)

## 2018-08-16 LAB — FERRITIN: Ferritin: 59 ng/mL (ref 11–307)

## 2018-08-16 LAB — IRON AND TIBC
Iron: 67 ug/dL (ref 28–170)
Saturation Ratios: 15 % (ref 10.4–31.8)
TIBC: 459 ug/dL — ABNORMAL HIGH (ref 250–450)
UIBC: 392 ug/dL

## 2018-08-17 ENCOUNTER — Other Ambulatory Visit: Payer: Self-pay

## 2018-08-17 ENCOUNTER — Inpatient Hospital Stay: Payer: Medicare Other

## 2018-08-17 ENCOUNTER — Inpatient Hospital Stay (HOSPITAL_BASED_OUTPATIENT_CLINIC_OR_DEPARTMENT_OTHER): Payer: Medicare Other | Admitting: Oncology

## 2018-08-17 ENCOUNTER — Inpatient Hospital Stay: Payer: Medicare Other | Admitting: Oncology

## 2018-08-17 ENCOUNTER — Encounter: Payer: Self-pay | Admitting: Oncology

## 2018-08-17 DIAGNOSIS — D509 Iron deficiency anemia, unspecified: Secondary | ICD-10-CM | POA: Diagnosis not present

## 2018-08-17 MED ORDER — IRON-VITAMIN C 65-125 MG PO TABS: 1.0000 | ORAL_TABLET | Freq: Every day | ORAL | 1 refills | Status: DC

## 2018-08-17 NOTE — Progress Notes (Signed)
Called pt for WebEx visit.  Patient says she feels better since received iron infusions.

## 2018-08-18 NOTE — Progress Notes (Addendum)
HEMATOLOGY-ONCOLOGY TeleHEALTH VISIT PROGRESS NOTE  I connected with Rhonda Tyler on 08/18/18 at 10:15 AM EDT by video enabled telemedicine visit and verified that I am speaking with the correct person using two identifiers. I discussed the limitations, risks, security and privacy concerns of performing an evaluation and management service by telemedicine and the availability of in-person appointments. I also discussed with the patient that there may be a patient responsible charge related to this service. The patient expressed understanding and agreed to proceed.   Other persons participating in the visit and their role in the encounter:  Geraldine Solar, [place], check in patient   Janeann Merl, RN, check in patient.   Patient's location: Home  Provider's location: work   Risk analyst Complaint: follow up for management of iron deficiency anemia  INTERVAL HISTORY Rhonda Tyler is a 78 y.o. female who has above history reviewed by me today presents for follow up visit for management of iron deficiency anemia.  Problems and complaints are listed below: During the interval, she received IV venofer weekly x 4. She reports fatigue has significantly improved.  Denies hematochezia, hematuria, hematemesis, epistaxis, black tarry stool or easy bruising.   Previous GI work up includes  03/07/2018 upper endoscopy: Normal examined duodenum. - Many gastric polyps. Resected and retrieved. - Normal esophagus  11/29/2017 colonoscopy - The entire examined colon is normal. - No specimens collected.  11/29/2017 Upper endoscopy Normal esophagus. - Multiple gastric polyps. The largest with stigmata of bleeding resected and retrieved. Clip (MR conditional) was placed.- Normal examined duodenum    Review of Systems  Constitutional: Negative for appetite change, chills, fatigue and fever.  HENT:   Negative for hearing loss and voice change.   Eyes: Negative for eye problems.  Respiratory: Negative for chest  tightness and cough.   Cardiovascular: Negative for chest pain.  Gastrointestinal: Negative for abdominal distention, abdominal pain and blood in stool.  Endocrine: Negative for hot flashes.  Genitourinary: Negative for difficulty urinating and frequency.   Musculoskeletal: Negative for arthralgias.  Skin: Negative for itching and rash.  Neurological: Negative for extremity weakness.  Hematological: Negative for adenopathy.  Psychiatric/Behavioral: Negative for confusion.    Past Medical History:  Diagnosis Date  . Arthritis   . Cervical disc disease   . Cervical disc disease   . Chest pain   . Cholecystolithiasis   . Colon polyp   . Coronary artery disease   . Diabetes mellitus without complication ([place])   . GERD (gastroesophageal reflux disease)   . Hemorrhoid   . History of diabetic neuropathy   . Hyperlipemia   . Hypertension   . IBS (irritable bowel syndrome)   . Myocardial infarction ([place])   . Sleep apnea    Past Surgical History:  Procedure Laterality Date  . ABDOMINAL HYSTERECTOMY    . arthroscopic rotator cuff repair    . BACK SURGERY    . CARDIAC CATHETERIZATION    . CHOLECYSTECTOMY    . COLONOSCOPY    . COLONOSCOPY WITH PROPOFOL N/A 11/29/2017   Procedure: COLONOSCOPY WITH PROPOFOL;  Surgeon: Lollie Sails, MD;  Location: [facility] ENDOSCOPY;  Service: Endoscopy;  Laterality: N/A;  . CORONARY ANGIOPLASTY     PTCA and stent of RCA  . CORONARY ARTERY BYPASS GRAFT    . ESOPHAGOGASTRODUODENOSCOPY    . ESOPHAGOGASTRODUODENOSCOPY (EGD) WITH PROPOFOL N/A 11/29/2017   Procedure: ESOPHAGOGASTRODUODENOSCOPY (EGD) WITH PROPOFOL;  Surgeon: Lollie Sails, MD;  Location[facility] ENDOSCOPY;  Service: Endoscopy;  Laterality: N/A;  .  ESOPHAGOGASTRODUODENOSCOPY (EGD) WITH PROPOFOL N/A 03/07/2018   Procedure: ESOPHAGOGASTRODUODENOSCOPY (EGD) WITH PROPOFOL;  Surgeon: Lollie Sails, MD;  Location: [facility] ENDOSCOPY;  Service: Endoscopy;  Laterality: N/A;  . EXCISION OF ADNEXAL  MASS    . JOINT REPLACEMENT     left total knee replacement  . OOPHORECTOMY    . vesicular vaginal fistula      Family History  Problem Relation Age of Onset  . Hypertension Mother   . Heart attack Mother   . Hypertension Father   . Heart attack Father   . Skin cancer Father   . Heart attack Sister   . Breast cancer Neg Hx     Social History   Socioeconomic History  . Marital status: Married    Spouse name: Not on file  . Number of children: Not on file  . Years of education: Not on file  . Highest education level: Not on file  Occupational History  . Occupation: retired  Scientific laboratory technician  . Financial resource strain: Not on file  . Food insecurity:    Worry: Not on file    Inability: Not on file  . Transportation needs:    Medical: Not on file    Non-medical: Not on file  Tobacco Use  . Smoking status: Former Smoker    Types: Cigarettes    Last attempt to quit: 12/01/1970    Years since quitting: 47.7  . Smokeless tobacco: Never Used  Substance and Sexual Activity  . Alcohol use: Not Currently  . Drug use: Never  . Sexual activity: Not on file  Lifestyle  . Physical activity:    Days per week: Not on file    Minutes per session: Not on file  . Stress: Not on file  Relationships  . Social connections:    Talks on phone: Not on file    Gets together: Not on file    Attends religious service: Not on file    Active member of club or organization: Not on file    Attends meetings of clubs or organizations: Not on file    Relationship status: Not on file  . Intimate partner violence:    Fear of current or ex partner: Not on file    Emotionally abused: Not on file    Physically abused: Not on file    Forced sexual activity: Not on file  Other Topics Concern  . Not on file  Social History Narrative  . Not on file    Current Outpatient Medications on File Prior to Visit  Medication Sig Dispense Refill  . acetaminophen (TYLENOL) 500 MG tablet Take 500 mg by mouth  every 4 (four) hours as needed.    Marland Kitchen allopurinol (ZYLOPRIM) 100 MG tablet Take 100 mg by mouth daily.    Marland Kitchen aspirin EC 81 MG tablet Take 81 mg by mouth daily.    . calcium carbonate (OS-CAL) 600 MG TABS tablet Take 600 mg by mouth 2 (two) times daily.     . cefdinir (OMNICEF) 300 MG capsule Take 1 capsule (300 mg total) by mouth 2 (two) times daily. 8 capsule 0  . fenofibrate 160 MG tablet Take 160 mg by mouth daily.    . insulin NPH-regular Human (NOVOLIN 70/30) (70-30) 100 UNIT/ML injection Inject 30-45 Units into the skin See admin instructions. Inject up to 45u under the skin every morning and up to 30u under the skin every night    . insulin regular (NOVOLIN R,HUMULIN R) 100 units/mL injection  Inject 4-10 Units into the skin daily before lunch.    . lisinopril (PRINIVIL,ZESTRIL) 40 MG tablet Take 40 mg by mouth daily.    Marland Kitchen loratadine (CLARITIN) 10 MG tablet Take 10 mg by mouth daily.    . magnesium oxide (MAG-OX) 400 MG tablet Take 400 mg by mouth 2 (two) times daily.     . metFORMIN (GLUCOPHAGE-XR) 500 MG 24 hr tablet Take 1,000 mg by mouth 2 (two) times daily.     . metoCLOPramide (REGLAN) 10 MG tablet Take 10 mg by mouth 2 (two) times daily as needed for nausea or vomiting.     . pantoprazole (PROTONIX) 40 MG tablet Take 40 mg by mouth 2 (two) times daily.    . potassium chloride SA (K-DUR,KLOR-CON) 20 MEQ tablet Take 20 mEq by mouth daily.    . propranolol (INDERAL) 40 MG tablet Take 40 mg by mouth 2 (two) times daily.     Marland Kitchen torsemide (DEMADEX) 10 MG tablet Take 10 mg by mouth daily.    . vitamin B-12 (CYANOCOBALAMIN) 1000 MCG tablet Take 2,000 mcg by mouth daily.      No current facility-administered medications on file prior to visit.     Allergies  Allergen Reactions  . Allegra [Fexofenadine]   . Codeine   . Levaquin [Levofloxacin In D5w]   . Lipitor [Atorvastatin Calcium]   . Lopressor [Metoprolol Tartrate] Other (See Comments)    HEART RACES  . Penicillins   .  Potassium-Containing Compounds   . Procardia [Nifedipine]   . Sucralfate   . Sulfa Antibiotics   . Tramadol        Observations/Objective: There were no vitals filed for this visit. There is no height or weight on file to calculate BMI.  Pain level 0 Physical Exam  Constitutional: She is oriented to person, place, and time. No distress.  HENT:  Head: Normocephalic and atraumatic.  Eyes: EOM are normal.  Neck: Normal range of motion.  Neurological: She is alert and oriented to person, place, and time.  Psychiatric: Affect normal.    CBC    Component Value Date/Time   WBC 3.7 (L) 08/16/2018 1033   RBC 4.82 08/16/2018 1033   HGB 11.2 (L) 08/16/2018 1033   HCT 37.4 08/16/2018 1033   PLT 183 08/16/2018 1033   MCV 77.6 (L) 08/16/2018 1033   MCH 23.2 (L) 08/16/2018 1033   MCHC 29.9 (L) 08/16/2018 1033   RDW 25.5 (H) 08/16/2018 1033   LYMPHSABS 1.0 08/16/2018 1033   MONOABS 0.3 08/16/2018 1033   EOSABS 0.1 08/16/2018 1033   BASOSABS 0.0 08/16/2018 1033    CMP     Component Value Date/Time   NA 137 06/30/2018 1136   K 3.9 06/30/2018 1136   CL 102 06/30/2018 1136   CO2 26 06/30/2018 1136   GLUCOSE 136 (H) 06/30/2018 1136   BUN 15 06/30/2018 1136   CREATININE 0.98 06/30/2018 1136   CALCIUM 9.6 06/30/2018 1136   PROT 7.5 06/30/2018 1136   ALBUMIN 3.9 06/30/2018 1136   AST 25 06/30/2018 1136   ALT 13 06/30/2018 1136   ALKPHOS 72 06/30/2018 1136   BILITOT 0.7 06/30/2018 1136   GFRNONAA 56 (L) 06/30/2018 1136   GFRAA >60 06/30/2018 1136     Assessment and Plan: 1. Iron deficiency anemia, unspecified iron deficiency anemia type     Labs are reviewed and discussed with patient.  Both hemoglobin and iron panel showed improvement.  TIBC still elevated, she may benefit from additional  one or two doses of IV Venofer.  Given that we are in Covid 19 outbreak, I recommend patient to try Vitron C 1 tab daily.  Repeat labs in 8 weeks.   Follow Up Instructions: 8 weeks with  lab and MD accessment, +/- venfoer.    I discussed the assessment and treatment plan with the patient. The patient was provided an opportunity to ask questions and all were answered. The patient agreed with the plan and demonstrated an understanding of the instructions.  The patient was advised to call back or seek an in-person evaluation if the symptoms worsen or if the condition fails to improve as anticipated.   I provided 16 minutes of face-to-face video visit time during this encounter, and > 50% was spent counseling as documented under my assessment & plan.  Earlie Server, MD 08/18/2018 1:04 PM

## 2018-10-10 ENCOUNTER — Other Ambulatory Visit: Payer: Self-pay

## 2018-10-11 ENCOUNTER — Other Ambulatory Visit: Payer: Self-pay

## 2018-10-11 ENCOUNTER — Inpatient Hospital Stay: Payer: Medicare Other | Attending: Oncology

## 2018-10-11 DIAGNOSIS — D509 Iron deficiency anemia, unspecified: Secondary | ICD-10-CM | POA: Diagnosis present

## 2018-10-11 DIAGNOSIS — Z7982 Long term (current) use of aspirin: Secondary | ICD-10-CM | POA: Diagnosis not present

## 2018-10-11 DIAGNOSIS — Z87891 Personal history of nicotine dependence: Secondary | ICD-10-CM | POA: Insufficient documentation

## 2018-10-11 LAB — CBC WITH DIFFERENTIAL/PLATELET
Abs Immature Granulocytes: 0.02 10*3/uL (ref 0.00–0.07)
Basophils Absolute: 0 10*3/uL (ref 0.0–0.1)
Basophils Relative: 0 %
Eosinophils Absolute: 0.4 10*3/uL (ref 0.0–0.5)
Eosinophils Relative: 7 %
HCT: 39.1 % (ref 36.0–46.0)
Hemoglobin: 12.5 g/dL (ref 12.0–15.0)
Immature Granulocytes: 0 %
Lymphocytes Relative: 29 %
Lymphs Abs: 1.5 10*3/uL (ref 0.7–4.0)
MCH: 26.4 pg (ref 26.0–34.0)
MCHC: 32 g/dL (ref 30.0–36.0)
MCV: 82.5 fL (ref 80.0–100.0)
Monocytes Absolute: 0.4 10*3/uL (ref 0.1–1.0)
Monocytes Relative: 7 %
Neutro Abs: 2.9 10*3/uL (ref 1.7–7.7)
Neutrophils Relative %: 57 %
Platelets: 205 10*3/uL (ref 150–400)
RBC: 4.74 MIL/uL (ref 3.87–5.11)
RDW: 17.5 % — ABNORMAL HIGH (ref 11.5–15.5)
WBC: 5.1 10*3/uL (ref 4.0–10.5)
nRBC: 0 % (ref 0.0–0.2)

## 2018-10-11 LAB — IRON AND TIBC
Iron: 52 ug/dL (ref 28–170)
Saturation Ratios: 12 % (ref 10.4–31.8)
TIBC: 454 ug/dL — ABNORMAL HIGH (ref 250–450)
UIBC: 402 ug/dL

## 2018-10-11 LAB — FERRITIN: Ferritin: 34 ng/mL (ref 11–307)

## 2018-10-12 ENCOUNTER — Inpatient Hospital Stay (HOSPITAL_BASED_OUTPATIENT_CLINIC_OR_DEPARTMENT_OTHER): Payer: Medicare Other | Admitting: Oncology

## 2018-10-12 ENCOUNTER — Other Ambulatory Visit: Payer: Self-pay

## 2018-10-12 ENCOUNTER — Inpatient Hospital Stay: Payer: Medicare Other

## 2018-10-12 ENCOUNTER — Encounter: Payer: Self-pay | Admitting: Oncology

## 2018-10-12 VITALS — BP 145/78 | HR 69 | Resp 18

## 2018-10-12 VITALS — BP 153/71 | HR 76 | Temp 98.9°F | Resp 18 | Wt 199.3 lb

## 2018-10-12 DIAGNOSIS — Z87891 Personal history of nicotine dependence: Secondary | ICD-10-CM | POA: Diagnosis not present

## 2018-10-12 DIAGNOSIS — Z7982 Long term (current) use of aspirin: Secondary | ICD-10-CM | POA: Diagnosis not present

## 2018-10-12 DIAGNOSIS — D509 Iron deficiency anemia, unspecified: Secondary | ICD-10-CM

## 2018-10-12 DIAGNOSIS — R5382 Chronic fatigue, unspecified: Secondary | ICD-10-CM | POA: Diagnosis not present

## 2018-10-12 MED ORDER — IRON SUCROSE 20 MG/ML IV SOLN
200.0000 mg | Freq: Once | INTRAVENOUS | Status: AC
  Administered 2018-10-12: 200 mg via INTRAVENOUS
  Filled 2018-10-12: qty 10

## 2018-10-12 MED ORDER — SODIUM CHLORIDE 0.9 % IV SOLN
Freq: Once | INTRAVENOUS | Status: AC
  Administered 2018-10-12: 14:00:00 via INTRAVENOUS
  Filled 2018-10-12: qty 250

## 2018-10-12 NOTE — Progress Notes (Signed)
Patient here for follow up. States she has had increased tiredness.

## 2018-10-13 NOTE — Progress Notes (Signed)
Hematology/Oncology  Follow up  note Rhonda Tyler [phone] Fax:([phone]) [phone]   Patient Care Team: Kirk Ruths, MD as PCP - General (Internal Medicine)  REFERRING PROVIDER: Kirk Ruths, MD  CHIEF COMPLAINTS/REASON FOR VISIT:  Evaluation of iron deficiency anemia  HISTORY OF PRESENTING ILLNESS:  Rhonda Tyler is a  78 y.o.  female with PMH listed below who was referred to me for evaluation of iron deficiency anemia Reviewed patient's recent labs that was done at [facility] office. 05/02/2018 Labs revealed anemia with hemoglobin of 9.3, MCV 74.7, wbc 6.1, platelet 343,000 Iron panel showed TIBC 602, iron saturation 9, ferritin 14.   Reviewed patient's previous labs ordered by primary care physician's office, anemia is chronic onset , duration is since April 2019 No aggravating or improving factors.  Associated signs and symptoms: Patient reports fatigue. Mild  SOB with exertion.  Denies weight loss, easy bruising, hematochezia, hemoptysis, hematuria. Context:  History of iron deficiency:  Rectal bleeding: deneis Hematemesis or hemoptysis : denies Blood in urine : denies  Last endoscopy: 03/08/2019 upper endoscopy showed many gastric polyps. 11/29/2017 The entire examined colon is normal. - No specimens collected  11/29/2017 Upper endosocpy, Multiple gastric polyps. The largest with stigmata of bleeding resected and retrieved. Clip (MR conditional) was placed. Pathology showed hyperplastic gastric polyps.  Fatigue: Yes.   She is on Aspirin 81mg  daily,   INTERVAL HISTORY Rhonda Tyler is a 78 y.o. female who has above history reviewed by me today presents for follow up visit for management of iron deficiency anemia Problems and complaints are listed below: Patient reports feeling tired recently. Otherwise no new complaints. Fatigue: reports worsening fatigue. Chronic onset, perisistent, no aggravating or improving factors, no  associated symptoms.     Review of Systems  Constitutional: Positive for fatigue. Negative for appetite change, chills and fever.  HENT:   Negative for hearing loss and voice change.   Eyes: Negative for eye problems.  Respiratory: Negative for chest tightness and cough.   Cardiovascular: Negative for chest pain.  Gastrointestinal: Negative for abdominal distention, abdominal pain and blood in stool.  Endocrine: Negative for hot flashes.  Genitourinary: Negative for difficulty urinating and frequency.   Musculoskeletal: Negative for arthralgias.  Skin: Negative for itching and rash.  Neurological: Negative for extremity weakness.  Hematological: Negative for adenopathy.  Psychiatric/Behavioral: Negative for confusion.    MEDICAL HISTORY:  Past Medical History:  Diagnosis Date  . Arthritis   . Cervical disc disease   . Cervical disc disease   . Chest pain   . Cholecystolithiasis   . Colon polyp   . Coronary artery disease   . Diabetes mellitus without complication ([place])   . GERD (gastroesophageal reflux disease)   . Hemorrhoid   . History of diabetic neuropathy   . Hyperlipemia   . Hypertension   . IBS (irritable bowel syndrome)   . Myocardial infarction ([place])   . Sleep apnea     SURGICAL HISTORY: Past Surgical History:  Procedure Laterality Date  . ABDOMINAL HYSTERECTOMY    . arthroscopic rotator cuff repair    . BACK SURGERY    . CARDIAC CATHETERIZATION    . CHOLECYSTECTOMY    . COLONOSCOPY    . COLONOSCOPY WITH PROPOFOL N/A 11/29/2017   Procedure: COLONOSCOPY WITH PROPOFOL;  Surgeon: Lollie Sails, MD;  Location: [facility] ENDOSCOPY;  Service: Endoscopy;  Laterality: N/A;  . CORONARY ANGIOPLASTY     PTCA and stent of RCA  .  CORONARY ARTERY BYPASS GRAFT    . ESOPHAGOGASTRODUODENOSCOPY    . ESOPHAGOGASTRODUODENOSCOPY (EGD) WITH PROPOFOL N/A 11/29/2017   Procedure: ESOPHAGOGASTRODUODENOSCOPY (EGD) WITH PROPOFOL;  Surgeon: Lollie Sails, MD;  Location: [facility]  ENDOSCOPY;  Service: Endoscopy;  Laterality: N/A;  . ESOPHAGOGASTRODUODENOSCOPY (EGD) WITH PROPOFOL N/A 03/07/2018   Procedure: ESOPHAGOGASTRODUODENOSCOPY (EGD) WITH PROPOFOL;  Surgeon: Lollie Sails, MD;  Location: [facility] ENDOSCOPY;  Service: Endoscopy;  Laterality: N/A;  . EXCISION OF ADNEXAL MASS    . JOINT REPLACEMENT     left total knee replacement  . OOPHORECTOMY    . vesicular vaginal fistula      SOCIAL HISTORY: Social History   Socioeconomic History  . Marital status: Married    Spouse name: Not on file  . Number of children: Not on file  . Years of education: Not on file  . Highest education level: Not on file  Occupational History  . Occupation: retired  Scientific laboratory technician  . Financial resource strain: Not on file  . Food insecurity:    Worry: Not on file    Inability: Not on file  . Transportation needs:    Medical: Not on file    Non-medical: Not on file  Tobacco Use  . Smoking status: Former Smoker    Types: Cigarettes    Last attempt to quit: 12/01/1970    Years since quitting: 47.8  . Smokeless tobacco: Never Used  Substance and Sexual Activity  . Alcohol use: Not Currently  . Drug use: Never  . Sexual activity: Not on file  Lifestyle  . Physical activity:    Days per week: Not on file    Minutes per session: Not on file  . Stress: Not on file  Relationships  . Social connections:    Talks on phone: Not on file    Gets together: Not on file    Attends religious service: Not on file    Active member of club or organization: Not on file    Attends meetings of clubs or organizations: Not on file    Relationship status: Not on file  . Intimate partner violence:    Fear of current or ex partner: Not on file    Emotionally abused: Not on file    Physically abused: Not on file    Forced sexual activity: Not on file  Other Topics Concern  . Not on file  Social History Narrative  . Not on file    FAMILY HISTORY: Family History  Problem Relation Age  of Onset  . Hypertension Mother   . Heart attack Mother   . Hypertension Father   . Heart attack Father   . Skin cancer Father   . Heart attack Sister   . Breast cancer Neg Hx     ALLERGIES:  is allergic to allegra [fexofenadine]; codeine; levaquin [levofloxacin in d5w]; lipitor [atorvastatin calcium]; lopressor [metoprolol tartrate]; penicillins; potassium-containing compounds; procardia [nifedipine]; sucralfate; sulfa antibiotics; and tramadol.  MEDICATIONS:  Current Outpatient Medications  Medication Sig Dispense Refill  . acetaminophen (TYLENOL) 500 MG tablet Take 500 mg by mouth every 4 (four) hours as needed.    Marland Kitchen allopurinol (ZYLOPRIM) 100 MG tablet Take 100 mg by mouth daily.    Marland Kitchen aspirin EC 81 MG tablet Take 81 mg by mouth daily.    . calcium carbonate (OS-CAL) 600 MG TABS tablet Take 600 mg by mouth daily with breakfast.     . fenofibrate 160 MG tablet Take 160 mg by mouth daily.    Marland Kitchen  insulin NPH-regular Human (NOVOLIN 70/30) (70-30) 100 UNIT/ML injection Inject 30-45 Units into the skin See admin instructions. Inject up to 45u under the skin every morning and up to 30u under the skin every night    . insulin regular (NOVOLIN R,HUMULIN R) 100 units/mL injection Inject 4-10 Units into the skin daily before lunch.    . Iron-Vitamin C 65-125 MG TABS Take 1 tablet by mouth daily. 30 tablet 1  . lisinopril (PRINIVIL,ZESTRIL) 40 MG tablet Take 40 mg by mouth daily.    Marland Kitchen loratadine (CLARITIN) 10 MG tablet Take 10 mg by mouth daily.    . magnesium oxide (MAG-OX) 400 MG tablet Take 400 mg by mouth 2 (two) times daily.     . metFORMIN (GLUCOPHAGE-XR) 500 MG 24 hr tablet Take 1,000 mg by mouth 2 (two) times daily.     . metoCLOPramide (REGLAN) 10 MG tablet Take 10 mg by mouth 2 (two) times daily as needed for nausea or vomiting.     . pantoprazole (PROTONIX) 40 MG tablet Take 40 mg by mouth 2 (two) times daily.    . potassium chloride SA (K-DUR,KLOR-CON) 20 MEQ tablet Take 20 mEq by mouth  daily.    . propranolol (INDERAL) 40 MG tablet Take 40 mg by mouth 2 (two) times daily.     Marland Kitchen torsemide (DEMADEX) 10 MG tablet Take 10 mg by mouth daily.    . vitamin B-12 (CYANOCOBALAMIN) 1000 MCG tablet Take 2,000 mcg by mouth daily.      No current facility-administered medications for this visit.      PHYSICAL EXAMINATION: ECOG PERFORMANCE STATUS: 1 - Symptomatic but completely ambulatory Vitals:   10/12/18 1306  BP: (!) 153/71  Pulse: 76  Resp: 18  Temp: 98.9 F (37.2 C)   Filed Weights   10/12/18 1306  Weight: 199 lb 4.8 oz (90.4 kg)    Physical Exam Constitutional:      General: She is not in acute distress. HENT:     Head: Normocephalic and atraumatic.  Eyes:     General: No scleral icterus.    Pupils: Pupils are equal, round, and reactive to light.  Neck:     Musculoskeletal: Normal range of motion and neck supple.  Cardiovascular:     Rate and Rhythm: Normal rate and regular rhythm.     Heart sounds: Normal heart sounds.  Pulmonary:     Effort: Pulmonary effort is normal. No respiratory distress.     Breath sounds: No wheezing.  Abdominal:     General: Bowel sounds are normal. There is no distension.     Palpations: Abdomen is soft. There is no mass.     Tenderness: There is no abdominal tenderness.  Musculoskeletal: Normal range of motion.        General: No deformity.  Skin:    General: Skin is warm and dry.     Coloration: Skin is not pale.     Findings: No erythema or rash.  Neurological:     Mental Status: She is alert and oriented to person, place, and time.     Cranial Nerves: No cranial nerve deficit.     Coordination: Coordination normal.  Psychiatric:        Behavior: Behavior normal.        Thought Content: Thought content normal.       CMP Latest Ref Rng & Units 06/30/2018  Glucose 70 - 99 mg/dL 136(H)  BUN 8 - 23 mg/dL 15  Creatinine 0.44 -  1.00 mg/dL 0.98  Sodium 135 - 145 mmol/L 137  Potassium 3.5 - 5.1 mmol/L 3.9  Chloride  98 - 111 mmol/L 102  CO2 22 - 32 mmol/L 26  Calcium 8.9 - 10.3 mg/dL 9.6  Total Protein 6.5 - 8.1 g/dL 7.5  Total Bilirubin 0.3 - 1.2 mg/dL 0.7  Alkaline Phos 38 - 126 U/L 72  AST 15 - 41 U/L 25  ALT 0 - 44 U/L 13   CBC Latest Ref Rng & Units 10/11/2018  WBC 4.0 - 10.5 K/uL 5.1  Hemoglobin 12.0 - 15.0 g/dL 12.5  Hematocrit 36.0 - 46.0 % 39.1  Platelets 150 - 400 K/uL 205     LABORATORY DATA:  I have reviewed the data as listed Lab Results  Component Value Date   WBC 5.1 10/11/2018   HGB 12.5 10/11/2018   HCT 39.1 10/11/2018   MCV 82.5 10/11/2018   PLT 205 10/11/2018   Recent Labs    03/25/18 0545 03/26/18 0507 06/30/18 1136  NA 138 136 137  K 3.8 4.2 3.9  CL 100 97* 102  CO2 29 31 26   GLUCOSE 249* 245* 136*  BUN 20 20 15   CREATININE 0.74 0.81 0.98  CALCIUM 9.5 9.7 9.6  GFRNONAA >60 >60 56*  GFRAA >60 >60 >60  PROT  --   --  7.5  ALBUMIN  --   --  3.9  AST  --   --  25  ALT  --   --  13  ALKPHOS  --   --  72  BILITOT  --   --  0.7   Iron/TIBC/Ferritin/ %Sat    Component Value Date/Time   IRON 52 10/11/2018 1339   TIBC 454 (H) 10/11/2018 1339   FERRITIN 34 10/11/2018 1339   IRONPCTSAT 12 10/11/2018 1339     No results found.    ASSESSMENT & PLAN:  1. Iron deficiency anemia, unspecified iron deficiency anemia type    Labs are reviewed and discussed with patient. CBC showed normalized hemoglobin level at 12.5.  Anemia has resolved. Iron panel showed TIBC 454, iron saturation 12, ferritin decreased to 34. We will proceed with  IV Venofer 200 mg x 1. She can continue take Vitron C as maintenance treatment.   Orders Placed This Encounter  Procedures  . CBC with Differential/Platelet    Standing Status:   Future    Standing Expiration Date:   10/13/2019  . Comprehensive metabolic panel    Standing Status:   Future    Standing Expiration Date:   10/13/2019  . Iron and TIBC    Standing Status:   Future    Standing Expiration Date:   10/13/2019  .  Ferritin    Standing Status:   Future    Standing Expiration Date:   10/13/2019    All questions were answered. The patient knows to call the clinic with any problems questions or concerns.  Return of visit: 4 months. We spent sufficient time to discuss many aspect of care, questions were answered to patient's satisfaction. Total face to face encounter time for this patient visit was 15 min. >50% of the time was  spent in counseling and coordination of care.    Earlie Server, MD, PhD 10/13/2018

## 2018-11-30 ENCOUNTER — Other Ambulatory Visit: Payer: Self-pay | Admitting: Internal Medicine

## 2018-11-30 DIAGNOSIS — Z1231 Encounter for screening mammogram for malignant neoplasm of breast: Secondary | ICD-10-CM

## 2019-01-04 ENCOUNTER — Ambulatory Visit
Admission: RE | Admit: 2019-01-04 | Discharge: 2019-01-04 | Disposition: A | Discharge status: Home / Self Care | Payer: Medicare Other | Source: Ambulatory Visit | Attending: Internal Medicine | Admitting: Internal Medicine

## 2019-01-04 DIAGNOSIS — Z1231 Encounter for screening mammogram for malignant neoplasm of breast: Secondary | ICD-10-CM | POA: Insufficient documentation

## 2019-02-09 ENCOUNTER — Other Ambulatory Visit: Payer: Self-pay

## 2019-02-09 ENCOUNTER — Inpatient Hospital Stay: Payer: Medicare Other | Attending: Oncology

## 2019-02-09 DIAGNOSIS — R5382 Chronic fatigue, unspecified: Secondary | ICD-10-CM | POA: Insufficient documentation

## 2019-02-09 DIAGNOSIS — D509 Iron deficiency anemia, unspecified: Secondary | ICD-10-CM | POA: Diagnosis not present

## 2019-02-09 DIAGNOSIS — Z7982 Long term (current) use of aspirin: Secondary | ICD-10-CM | POA: Insufficient documentation

## 2019-02-09 DIAGNOSIS — Z87891 Personal history of nicotine dependence: Secondary | ICD-10-CM | POA: Insufficient documentation

## 2019-02-09 LAB — CBC WITH DIFFERENTIAL/PLATELET
Abs Immature Granulocytes: 0.02 10*3/uL (ref 0.00–0.07)
Basophils Absolute: 0 10*3/uL (ref 0.0–0.1)
Basophils Relative: 1 %
Eosinophils Absolute: 0.1 10*3/uL (ref 0.0–0.5)
Eosinophils Relative: 4 %
HCT: 38.4 % (ref 36.0–46.0)
Hemoglobin: 12.5 g/dL (ref 12.0–15.0)
Immature Granulocytes: 1 %
Lymphocytes Relative: 21 %
Lymphs Abs: 0.8 10*3/uL (ref 0.7–4.0)
MCH: 29.4 pg (ref 26.0–34.0)
MCHC: 32.6 g/dL (ref 30.0–36.0)
MCV: 90.4 fL (ref 80.0–100.0)
Monocytes Absolute: 0.4 10*3/uL (ref 0.1–1.0)
Monocytes Relative: 10 %
Neutro Abs: 2.5 10*3/uL (ref 1.7–7.7)
Neutrophils Relative %: 63 %
Platelets: 185 10*3/uL (ref 150–400)
RBC: 4.25 MIL/uL (ref 3.87–5.11)
RDW: 13.8 % (ref 11.5–15.5)
WBC: 3.9 10*3/uL — ABNORMAL LOW (ref 4.0–10.5)
nRBC: 0 % (ref 0.0–0.2)

## 2019-02-09 LAB — COMPREHENSIVE METABOLIC PANEL
ALT: 17 U/L (ref 0–44)
AST: 25 U/L (ref 15–41)
Albumin: 3.9 g/dL (ref 3.5–5.0)
Alkaline Phosphatase: 69 U/L (ref 38–126)
Anion gap: 9 (ref 5–15)
BUN: 21 mg/dL (ref 8–23)
CO2: 27 mmol/L (ref 22–32)
Calcium: 10.4 mg/dL — ABNORMAL HIGH (ref 8.9–10.3)
Chloride: 102 mmol/L (ref 98–111)
Creatinine, Ser: 0.95 mg/dL (ref 0.44–1.00)
GFR calc Af Amer: >60 mL/min (ref >60)
GFR calc non Af Amer: 58 mL/min — ABNORMAL LOW (ref >60)
Glucose, Bld: 208 mg/dL — ABNORMAL HIGH (ref 70–99)
Potassium: 4.2 mmol/L (ref 3.5–5.1)
Sodium: 138 mmol/L (ref 135–145)
Total Bilirubin: 0.7 mg/dL (ref 0.3–1.2)
Total Protein: 7.3 g/dL (ref 6.5–8.1)

## 2019-02-09 LAB — FERRITIN: Ferritin: 45 ng/mL (ref 11–307)

## 2019-02-09 LAB — IRON AND TIBC
Iron: 50 ug/dL (ref 28–170)
Saturation Ratios: 11 % (ref 10.4–31.8)
TIBC: 438 ug/dL (ref 250–450)
UIBC: 388 ug/dL

## 2019-02-10 ENCOUNTER — Other Ambulatory Visit: Payer: Medicare Other

## 2019-02-13 ENCOUNTER — Inpatient Hospital Stay: Payer: Medicare Other

## 2019-02-13 ENCOUNTER — Other Ambulatory Visit: Payer: Self-pay

## 2019-02-13 ENCOUNTER — Ambulatory Visit
Admission: RE | Admit: 2019-02-13 | Discharge: 2019-02-13 | Disposition: A | Discharge status: Home / Self Care | Payer: Medicare Other | Source: Ambulatory Visit | Attending: Dermatology | Admitting: Dermatology

## 2019-02-13 ENCOUNTER — Other Ambulatory Visit: Payer: Self-pay | Admitting: Dermatology

## 2019-02-13 ENCOUNTER — Encounter: Payer: Self-pay | Admitting: Oncology

## 2019-02-13 ENCOUNTER — Inpatient Hospital Stay (HOSPITAL_BASED_OUTPATIENT_CLINIC_OR_DEPARTMENT_OTHER): Payer: Medicare Other | Admitting: Oncology

## 2019-02-13 VITALS — BP 143/76 | HR 71 | Temp 99.8°F | Resp 18 | Wt 199.1 lb

## 2019-02-13 DIAGNOSIS — D509 Iron deficiency anemia, unspecified: Secondary | ICD-10-CM

## 2019-02-13 DIAGNOSIS — D860 Sarcoidosis of lung: Secondary | ICD-10-CM

## 2019-02-13 MED ORDER — IRON-VITAMIN C 65-125 MG PO TABS: 1.0000 | ORAL_TABLET | Freq: Every day | ORAL | 11 refills | Status: DC

## 2019-02-13 NOTE — Progress Notes (Signed)
Patient here for follow up. 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 She will be going for xray and bloodwork today.

## 2019-02-13 NOTE — Progress Notes (Signed)
Hematology/Oncology  Follow up  note [facility] [phone] [phone]   Patient Care Team: Kirk Ruths, MD as PCP - General (Internal Medicine)  REFERRING PROVIDER: Kirk Ruths, MD  CHIEF COMPLAINTS/REASON FOR VISIT:  Evaluation of iron deficiency anemia  HISTORY OF PRESENTING ILLNESS:  Rhonda Tyler is a  78 y.o.  female with PMH listed below who was referred to me for evaluation of iron deficiency anemia Reviewed patient's recent labs that was done at [facility] office. 05/02/2018 Labs revealed anemia with hemoglobin of 9.3, MCV 74.7, wbc 6.1, platelet 343,000 Iron panel showed TIBC 602, iron saturation 9, ferritin 14.   Reviewed patient's previous labs ordered by primary care physician's office, anemia is chronic onset , duration is since April 2019 No aggravating or improving factors.  Associated signs and symptoms: Patient reports fatigue. Mild  SOB with exertion.  Denies weight loss, easy bruising, hematochezia, hemoptysis, hematuria. Context:  History of iron deficiency:  Rectal bleeding: deneis Hematemesis or hemoptysis : denies Blood in urine : denies  Last endoscopy: 03/08/2019 upper endoscopy showed many gastric polyps. 11/29/2017 The entire examined colon is normal. - No specimens collected  11/29/2017 Upper endosocpy, Multiple gastric polyps. The largest with stigmata of bleeding resected and retrieved. Clip (MR conditional) was placed. Pathology showed hyperplastic gastric polyps.  Fatigue: Yes.   She is on Aspirin 81mg  daily,   INTERVAL HISTORY Rhonda Tyler is a 78 y.o. female who has above history reviewed by me today presents for follow up visit for management of iron deficiency anemia Problems and complaints are listed below: Patient reports feeling well.  No new complaints. 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 Evorn Gong dermatology Chronic fatigue is at baseline.  Not better  or worse. For iron deficiency anemia, she takes Vitron C 1 tab daily.     Review of Systems  Constitutional: Positive for fatigue. Negative for appetite change, chills and fever.  HENT:   Negative for hearing loss and voice change.   Eyes: Negative for eye problems.  Respiratory: Negative for chest tightness and cough.   Cardiovascular: Negative for chest pain.  Gastrointestinal: Negative for abdominal distention, abdominal pain and blood in stool.  Endocrine: Negative for hot flashes.  Genitourinary: Negative for difficulty urinating and frequency.   Musculoskeletal: Negative for arthralgias.  Skin: Negative for itching and rash.  Neurological: Negative for extremity weakness.  Hematological: Negative for adenopathy.  Psychiatric/Behavioral: Negative for confusion.    MEDICAL HISTORY:  Past Medical History:  Diagnosis Date  . Arthritis   . Cervical disc disease   . Cervical disc disease   . Chest pain   . Cholecystolithiasis   . Colon polyp   . Coronary artery disease   . Diabetes mellitus without complication ([place])   . GERD (gastroesophageal reflux disease)   . Hemorrhoid   . History of diabetic neuropathy   . Hyperlipemia   . Hypertension   . IBS (irritable bowel syndrome)   . Myocardial infarction ([place])   . Sleep apnea     SURGICAL HISTORY: Past Surgical History:  Procedure Laterality Date  . ABDOMINAL HYSTERECTOMY    . arthroscopic rotator cuff repair    . BACK SURGERY    . CARDIAC CATHETERIZATION    . CHOLECYSTECTOMY    . COLONOSCOPY    . COLONOSCOPY WITH PROPOFOL N/A 11/29/2017   Procedure: COLONOSCOPY WITH PROPOFOL;  Surgeon: Lollie Sails, MD;  Location: [facility] ENDOSCOPY;  Service: Endoscopy;  Laterality: N/A;  . CORONARY ANGIOPLASTY     PTCA and stent of RCA  . CORONARY ARTERY BYPASS GRAFT    . ESOPHAGOGASTRODUODENOSCOPY    . ESOPHAGOGASTRODUODENOSCOPY (EGD) WITH PROPOFOL N/A 11/29/2017   Procedure: ESOPHAGOGASTRODUODENOSCOPY (EGD) WITH PROPOFOL;   Surgeon: Lollie Sails, MD;  Location: [facility] ENDOSCOPY;  Service: Endoscopy;  Laterality: N/A;  . ESOPHAGOGASTRODUODENOSCOPY (EGD) WITH PROPOFOL N/A 03/07/2018   Procedure: ESOPHAGOGASTRODUODENOSCOPY (EGD) WITH PROPOFOL;  Surgeon: Lollie Sails, MD;  Location: [facility] ENDOSCOPY;  Service: Endoscopy;  Laterality: N/A;  . EXCISION OF ADNEXAL MASS    . JOINT REPLACEMENT     left total knee replacement  . OOPHORECTOMY    . vesicular vaginal fistula      SOCIAL HISTORY: Social History   Socioeconomic History  . Marital status: Married    Spouse name: Not on file  . Number of children: Not on file  . Years of education: Not on file  . Highest education level: Not on file  Occupational History  . Occupation: retired  Scientific laboratory technician  . Financial resource strain: Not on file  . Food insecurity    Worry: Not on file    Inability: Not on file  . Transportation needs    Medical: Not on file    Non-medical: Not on file  Tobacco Use  . Smoking status: Former Smoker    Types: Cigarettes    Quit date: 12/01/1970    Years since quitting: 48.2  . Smokeless tobacco: Never Used  Substance and Sexual Activity  . Alcohol use: Not Currently  . Drug use: Never  . Sexual activity: Not on file  Lifestyle  . Physical activity    Days per week: Not on file    Minutes per session: Not on file  . Stress: Not on file  Relationships  . Social Herbalist on phone: Not on file    Gets together: Not on file    Attends religious service: Not on file    Active member of club or organization: Not on file    Attends meetings of clubs or organizations: Not on file    Relationship status: Not on file  . Intimate partner violence    Fear of current or ex partner: Not on file    Emotionally abused: Not on file    Physically abused: Not on file    Forced sexual activity: Not on file  Other Topics Concern  . Not on file  Social History Narrative  . Not on file    FAMILY HISTORY:  Family History  Problem Relation Age of Onset  . Hypertension Mother   . Heart attack Mother   . Hypertension Father   . Heart attack Father   . Skin cancer Father   . Heart attack Sister   . Breast cancer Neg Hx     ALLERGIES:  is allergic to allegra [fexofenadine]; codeine; levaquin [levofloxacin in d5w]; lipitor [atorvastatin calcium]; lopressor [metoprolol tartrate]; penicillins; potassium-containing compounds; procardia [nifedipine]; sucralfate; sulfa antibiotics; and tramadol.  MEDICATIONS:  Current Outpatient Medications  Medication Sig Dispense Refill  . acetaminophen (TYLENOL) 500 MG tablet Take 500 mg by mouth every 4 (four) hours as needed.    Marland Kitchen allopurinol (ZYLOPRIM) 100 MG tablet Take 100 mg by mouth daily.    Marland Kitchen aspirin EC 81 MG tablet Take 81 mg by mouth daily.    . calcium carbonate (OS-CAL) 600 MG TABS tablet Take 600 mg by mouth daily with breakfast.     .  fenofibrate 160 MG tablet Take 160 mg by mouth daily.    . insulin NPH-regular Human (NOVOLIN 70/30) (70-30) 100 UNIT/ML injection Inject 30-45 Units into the skin See admin instructions. Inject up to 45u under the skin every morning and up to 30u under the skin every night    . insulin regular (NOVOLIN R,HUMULIN R) 100 units/mL injection Inject 4-10 Units into the skin daily before lunch.    . Iron-Vitamin C 65-125 MG TABS Take 1 tablet by mouth daily. 30 tablet 1  . lisinopril (PRINIVIL,ZESTRIL) 40 MG tablet Take 40 mg by mouth daily.    Marland Kitchen loratadine (CLARITIN) 10 MG tablet Take 10 mg by mouth daily.    . magnesium oxide (MAG-OX) 400 MG tablet Take 400 mg by mouth 2 (two) times daily.     . metFORMIN (GLUCOPHAGE-XR) 500 MG 24 hr tablet Take 1,000 mg by mouth 2 (two) times daily.     . metoCLOPramide (REGLAN) 10 MG tablet Take 10 mg by mouth 2 (two) times daily as needed for nausea or vomiting.     . pantoprazole (PROTONIX) 40 MG tablet Take 40 mg by mouth 2 (two) times daily.    . potassium chloride SA  (K-DUR,KLOR-CON) 20 MEQ tablet Take 20 mEq by mouth daily.    . propranolol (INDERAL) 40 MG tablet Take 40 mg by mouth 2 (two) times daily.     Marland Kitchen torsemide (DEMADEX) 10 MG tablet Take 10 mg by mouth daily.    . vitamin B-12 (CYANOCOBALAMIN) 1000 MCG tablet Take 2,000 mcg by mouth daily.      No current facility-administered medications for this visit.      PHYSICAL EXAMINATION: ECOG PERFORMANCE STATUS: 1 - Symptomatic but completely ambulatory Vitals:   02/13/19 1310  BP: (!) 143/76  Pulse: 71  Resp: 18  Temp: 99.8 F (37.7 C)   Filed Weights   02/13/19 1310  Weight: 199 lb 1.6 oz (90.3 kg)    Physical Exam Constitutional:      General: She is not in acute distress. HENT:     Head: Normocephalic and atraumatic.  Eyes:     General: No scleral icterus.    Pupils: Pupils are equal, round, and reactive to light.  Neck:     Musculoskeletal: Normal range of motion and neck supple.  Cardiovascular:     Rate and Rhythm: Normal rate and regular rhythm.     Heart sounds: Normal heart sounds.  Pulmonary:     Effort: Pulmonary effort is normal. No respiratory distress.     Breath sounds: No wheezing.  Abdominal:     General: Bowel sounds are normal. There is no distension.     Palpations: Abdomen is soft. There is no mass.     Tenderness: There is no abdominal tenderness.  Musculoskeletal: Normal range of motion.        General: No deformity.  Skin:    General: Skin is warm and dry.     Coloration: Skin is not pale.     Findings: No erythema or rash.  Neurological:     Mental Status: She is alert and oriented to person, place, and time.     Cranial Nerves: No cranial nerve deficit.     Coordination: Coordination normal.  Psychiatric:        Behavior: Behavior normal.        Thought Content: Thought content normal.       CMP Latest Ref Rng & Units 02/09/2019  Glucose 70 -  99 mg/dL 208(H)  BUN 8 - 23 mg/dL 21  Creatinine 0.44 - 1.00 mg/dL 0.95  Sodium 135 - 145  mmol/L 138  Potassium 3.5 - 5.1 mmol/L 4.2  Chloride 98 - 111 mmol/L 102  CO2 22 - 32 mmol/L 27  Calcium 8.9 - 10.3 mg/dL 10.4(H)  Total Protein 6.5 - 8.1 g/dL 7.3  Total Bilirubin 0.3 - 1.2 mg/dL 0.7  Alkaline Phos 38 - 126 U/L 69  AST 15 - 41 U/L 25  ALT 0 - 44 U/L 17   CBC Latest Ref Rng & Units 02/09/2019  WBC 4.0 - 10.5 K/uL 3.9(L)  Hemoglobin 12.0 - 15.0 g/dL 12.5  Hematocrit 36.0 - 46.0 % 38.4  Platelets 150 - 400 K/uL 185     LABORATORY DATA:  I have reviewed the data as listed Lab Results  Component Value Date   WBC 3.9 (L) 02/09/2019   HGB 12.5 02/09/2019   HCT 38.4 02/09/2019   MCV 90.4 02/09/2019   PLT 185 02/09/2019   Recent Labs    03/26/18 0507 06/30/18 1136 02/09/19 1130  NA 136 137 138  K 4.2 3.9 4.2  CL 97* 102 102  CO2 31 26 27   GLUCOSE 245* 136* 208*  BUN 20 15 21   CREATININE 0.81 0.98 0.95  CALCIUM 9.7 9.6 10.4*  GFRNONAA >60 56* 58*  GFRAA >60 >60 >60  PROT  --  7.5 7.3  ALBUMIN  --  3.9 3.9  AST  --  25 25  ALT  --  13 17  ALKPHOS  --  72 69  BILITOT  --  0.7 0.7   Iron/TIBC/Ferritin/ %Sat    Component Value Date/Time   IRON 50 02/09/2019 1130   TIBC 438 02/09/2019 1130   FERRITIN 45 02/09/2019 1130   IRONPCTSAT 11 02/09/2019 1130     No results found.    ASSESSMENT & PLAN:  1. Iron deficiency anemia, unspecified iron deficiency anemia type   2. Hypercalcemia    Labs are reviewed and discussed with patient. CBC showed stable hemoglobin level.  Iron panel showed stable iron stores.  Iron saturation borderline at 11. Commend patient to continue take oral vitamin C as maintenance treatment for now. No need for IV Venofer treatments currently.  #Hypercalcemia, calcium level at 10.4.  Asymptomatic. Recommend patient to stop taking calcium supplementation. Repeat blood work when she sees primary care provider. She has been recently diagnosed with cutaneous sarcoidosis, possibly related to the hypercalcemia. .   Orders  Placed This Encounter  Procedures  . CBC with Differential/Platelet    Standing Status:   Future    Standing Expiration Date:   02/12/2021  . Ferritin    Standing Status:   Future    Standing Expiration Date:   02/12/2021  . Iron and TIBC    Standing Status:   Future    Standing Expiration Date:   02/12/2021    All questions were answered. The patient knows to call the clinic with any problems questions or concerns.  Return of visit: 12 months. We spent sufficient time to discuss many aspect of care, questions were answered to patient's satisfaction.    Earlie Server, MD, PhD 02/13/2019

## 2019-03-07 DIAGNOSIS — D863 Sarcoidosis of skin: Secondary | ICD-10-CM | POA: Insufficient documentation

## 2019-08-14 ENCOUNTER — Other Ambulatory Visit: Payer: Self-pay

## 2019-08-14 ENCOUNTER — Other Ambulatory Visit
Admission: RE | Admit: 2019-08-14 | Discharge: 2019-08-14 | Disposition: A | Discharge status: Home / Self Care | Payer: Medicare Other | Source: Ambulatory Visit | Attending: Internal Medicine | Admitting: Internal Medicine

## 2019-08-14 DIAGNOSIS — Z01812 Encounter for preprocedural laboratory examination: Secondary | ICD-10-CM | POA: Diagnosis present

## 2019-08-14 DIAGNOSIS — Z20822 Contact with and (suspected) exposure to covid-19: Secondary | ICD-10-CM | POA: Diagnosis not present

## 2019-08-15 ENCOUNTER — Encounter: Payer: Self-pay | Admitting: Internal Medicine

## 2019-08-15 LAB — SARS CORONAVIRUS 2 (TAT 6-24 HRS): SARS Coronavirus 2: NEGATIVE

## 2019-08-16 ENCOUNTER — Ambulatory Visit
Admission: RE | Admit: 2019-08-16 | Discharge: 2019-08-16 | Disposition: A | Discharge status: Home / Self Care | Payer: Medicare Other | Attending: Internal Medicine | Admitting: Internal Medicine

## 2019-08-16 ENCOUNTER — Ambulatory Visit: Payer: Medicare Other | Admitting: Anesthesiology

## 2019-08-16 ENCOUNTER — Encounter
Admission: RE | Disposition: A | Discharge status: Home / Self Care | Payer: Self-pay | Source: Home / Self Care | Attending: Internal Medicine

## 2019-08-16 ENCOUNTER — Other Ambulatory Visit: Payer: Self-pay

## 2019-08-16 ENCOUNTER — Encounter: Payer: Self-pay | Admitting: Internal Medicine

## 2019-08-16 DIAGNOSIS — I10 Essential (primary) hypertension: Secondary | ICD-10-CM | POA: Diagnosis not present

## 2019-08-16 DIAGNOSIS — Z888 Allergy status to other drugs, medicaments and biological substances status: Secondary | ICD-10-CM | POA: Insufficient documentation

## 2019-08-16 DIAGNOSIS — M1990 Unspecified osteoarthritis, unspecified site: Secondary | ICD-10-CM | POA: Insufficient documentation

## 2019-08-16 DIAGNOSIS — Z86010 Personal history of colonic polyps: Secondary | ICD-10-CM | POA: Diagnosis not present

## 2019-08-16 DIAGNOSIS — Z79899 Other long term (current) drug therapy: Secondary | ICD-10-CM | POA: Insufficient documentation

## 2019-08-16 DIAGNOSIS — Z880 Allergy status to penicillin: Secondary | ICD-10-CM | POA: Insufficient documentation

## 2019-08-16 DIAGNOSIS — I2510 Atherosclerotic heart disease of native coronary artery without angina pectoris: Secondary | ICD-10-CM | POA: Insufficient documentation

## 2019-08-16 DIAGNOSIS — R1030 Lower abdominal pain, unspecified: Secondary | ICD-10-CM | POA: Diagnosis present

## 2019-08-16 DIAGNOSIS — Z794 Long term (current) use of insulin: Secondary | ICD-10-CM | POA: Diagnosis not present

## 2019-08-16 DIAGNOSIS — K2100 Gastro-esophageal reflux disease with esophagitis, without bleeding: Secondary | ICD-10-CM | POA: Insufficient documentation

## 2019-08-16 DIAGNOSIS — E785 Hyperlipidemia, unspecified: Secondary | ICD-10-CM | POA: Diagnosis not present

## 2019-08-16 DIAGNOSIS — Z885 Allergy status to narcotic agent status: Secondary | ICD-10-CM | POA: Diagnosis not present

## 2019-08-16 DIAGNOSIS — I252 Old myocardial infarction: Secondary | ICD-10-CM | POA: Diagnosis not present

## 2019-08-16 DIAGNOSIS — K580 Irritable bowel syndrome with diarrhea: Secondary | ICD-10-CM | POA: Diagnosis not present

## 2019-08-16 DIAGNOSIS — K317 Polyp of stomach and duodenum: Secondary | ICD-10-CM | POA: Insufficient documentation

## 2019-08-16 DIAGNOSIS — E1140 Type 2 diabetes mellitus with diabetic neuropathy, unspecified: Secondary | ICD-10-CM | POA: Diagnosis not present

## 2019-08-16 DIAGNOSIS — Z7982 Long term (current) use of aspirin: Secondary | ICD-10-CM | POA: Insufficient documentation

## 2019-08-16 DIAGNOSIS — Z881 Allergy status to other antibiotic agents status: Secondary | ICD-10-CM | POA: Diagnosis not present

## 2019-08-16 DIAGNOSIS — Z882 Allergy status to sulfonamides status: Secondary | ICD-10-CM | POA: Diagnosis not present

## 2019-08-16 DIAGNOSIS — G4730 Sleep apnea, unspecified: Secondary | ICD-10-CM | POA: Diagnosis not present

## 2019-08-16 HISTORY — DX: Iron deficiency anemia, unspecified: D50.9

## 2019-08-16 HISTORY — PX: ESOPHAGOGASTRODUODENOSCOPY (EGD) WITH PROPOFOL: SHX5813

## 2019-08-16 HISTORY — DX: Sarcoidosis of skin: D86.3

## 2019-08-16 HISTORY — DX: Hypercalcemia: E83.52

## 2019-08-16 HISTORY — DX: Type 2 diabetes mellitus with diabetic neuropathy, unspecified: E11.40

## 2019-08-16 HISTORY — DX: Angina pectoris, unspecified: I20.9

## 2019-08-16 HISTORY — DX: Vitamin B12 deficiency anemia, unspecified: D51.9

## 2019-08-16 LAB — GLUCOSE, CAPILLARY: Glucose-Capillary: 111 mg/dL — ABNORMAL HIGH (ref 70–99)

## 2019-08-16 SURGERY — ESOPHAGOGASTRODUODENOSCOPY (EGD) WITH PROPOFOL
Anesthesia: General

## 2019-08-16 MED ORDER — PROPOFOL 500 MG/50ML IV EMUL
INTRAVENOUS | Status: DC | PRN
  Administered 2019-08-16: 140 ug/kg/min via INTRAVENOUS

## 2019-08-16 MED ORDER — PROPOFOL 10 MG/ML IV BOLUS
INTRAVENOUS | Status: DC | PRN
  Administered 2019-08-16: 50 mg via INTRAVENOUS

## 2019-08-16 MED ORDER — SODIUM CHLORIDE 0.9 % IV SOLN: INTRAVENOUS | Status: DC

## 2019-08-16 MED ORDER — LIDOCAINE HCL (PF) 2 % IJ SOLN
INTRAMUSCULAR | Status: DC | PRN
  Administered 2019-08-16: 100 mg via INTRADERMAL

## 2019-08-16 NOTE — Anesthesia Preprocedure Evaluation (Signed)
Anesthesia Evaluation  Patient identified by MRN, date of birth, ID band Patient awake    Reviewed: Allergy & Precautions, NPO status , Patient's Chart, lab work & pertinent test results  History of Anesthesia Complications Negative for: history of anesthetic complications  Airway Mallampati: III  TM Distance: >3 FB Neck ROM: Full    Dental  (+) Poor Dentition, Missing   Pulmonary sleep apnea and Continuous Positive Airway Pressure Ventilation , neg COPD, former smoker,    breath sounds clear to auscultation- rhonchi (-) wheezing      Cardiovascular hypertension, Pt. on medications (-) angina+ CAD, + Past MI, + Cardiac Stents (1995) and + CABG (1993)   Rhythm:Regular Rate:Normal - Systolic murmurs and - Diastolic murmurs    Neuro/Psych neg Seizures negative neurological ROS  negative psych ROS   GI/Hepatic Neg liver ROS, GERD  ,  Endo/Other  diabetes, Insulin Dependent  Renal/GU negative Renal ROS     Musculoskeletal  (+) Arthritis ,   Abdominal (+) + obese,   Peds  Hematology  (+) anemia ,   Anesthesia Other Findings Past Medical History: No date: Anginal pain (HCC) No date: Arthritis No date: B12 deficiency anemia No date: Cervical disc disease No date: Cervical disc disease No date: Chest pain No date: Cholecystolithiasis No date: Colon polyp No date: Coronary artery disease 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 No date: Diabetes mellitus without complication (HCC) No date: Diabetic neuropathy (HCC) No date: GERD (gastroesophageal reflux disease) No date: Hemorrhoid No date: History of diabetic neuropathy No date: Hypercalcemia No date: Hyperlipemia No date: Hypertension No date: IBS (irritable bowel syndrome) No date: Iron deficiency anemia No date: Myocardial infarction ([place]) No date: Sleep apnea   Reproductive/Obstetrics                             Anesthesia  Physical Anesthesia Plan  ASA: III  Anesthesia Plan: General   Post-op Pain Management:    Induction: Intravenous  PONV Risk Score and Plan: 2 and Propofol infusion  Airway Management Planned: Natural Airway  Additional Equipment:   Intra-op Plan:   Post-operative Plan:   Informed Consent: I have reviewed the patients History and Physical, chart, labs and discussed the procedure including the risks, benefits and alternatives for the proposed anesthesia with the patient or authorized representative who has indicated his/her understanding and acceptance.     Dental advisory given  Plan Discussed with: CRNA and Anesthesiologist  Anesthesia Plan Comments:         Anesthesia Quick Evaluation

## 2019-08-16 NOTE — H&P (Signed)
Outpatient short stay form Pre-procedure 08/16/2019 9:32 AM Rhonda Tyler K. [name], M.D.  Primary Physician: Frazier Richards, M.D.  Reason for visit:  Iron deficiency, diarrhea, GERD.  History of present illness: AS above. Patient denies intractable heartburn, dysphagia, hemetemesis, abdominal pain, nausea or vomiting.     Current Facility-Administered Medications:  .  0.9 %  sodium chloride infusion, , Intravenous, Continuous, [place], Benay Pike, MD, Last Rate: 20 mL/hr at 08/16/19 4098, Restarted at 08/16/19 1191  Facility-Administered Medications Ordered in Other Encounters:  .  lidocaine (XYLOCAINE) 2 % injection, , Intradermal, Anesthesia Intra-op, Noles, Mark, CRNA, 100 mg at 08/16/19 4782  Medications Prior to Admission  Medication Sig Dispense Refill Last Dose  . allopurinol (ZYLOPRIM) 100 MG tablet Take 100 mg by mouth daily.   08/15/2019 at Unknown time  . insulin NPH-regular Human (NOVOLIN 70/30) (70-30) 100 UNIT/ML injection Inject 30-45 Units into the skin See admin instructions. Inject up to 45u under the skin every morning and up to 30u under the skin every night   08/16/2019 at 0600  . lisinopril (PRINIVIL,ZESTRIL) 40 MG tablet Take 40 mg by mouth daily.   08/16/2019 at 0600  . loratadine (CLARITIN) 10 MG tablet Take 10 mg by mouth daily.   08/16/2019 at 0600  . metoCLOPramide (REGLAN) 10 MG tablet Take 10 mg by mouth 2 (two) times daily as needed for nausea or vomiting.    Past Week at Unknown time  . nitroGLYCERIN (NITROSTAT) 0.4 MG SL tablet Place 0.4 mg under the tongue every 5 (five) minutes as needed for chest pain.     Marland Kitchen propranolol (INDERAL) 40 MG tablet Take 40 mg by mouth 2 (two) times daily.    08/16/2019 at 0600  . acetaminophen (TYLENOL) 500 MG tablet Take 500 mg by mouth every 4 (four) hours as needed.     Marland Kitchen aspirin EC 81 MG tablet Take 81 mg by mouth daily.     . calcium carbonate (OS-CAL) 600 MG TABS tablet Take 600 mg by mouth daily with breakfast.      . fenofibrate  160 MG tablet Take 160 mg by mouth daily.     . insulin regular (NOVOLIN R,HUMULIN R) 100 units/mL injection Inject 4-10 Units into the skin daily before lunch.     . Iron-Vitamin C 65-125 MG TABS Take 1 tablet by mouth daily. 30 tablet 11   . magnesium oxide (MAG-OX) 400 MG tablet Take 400 mg by mouth 2 (two) times daily.      . metFORMIN (GLUCOPHAGE-XR) 500 MG 24 hr tablet Take 1,000 mg by mouth 2 (two) times daily.      . pantoprazole (PROTONIX) 40 MG tablet Take 40 mg by mouth 2 (two) times daily.     . potassium chloride SA (K-DUR,KLOR-CON) 20 MEQ tablet Take 20 mEq by mouth daily.     Marland Kitchen torsemide (DEMADEX) 10 MG tablet Take 10 mg by mouth daily.     . vitamin B-12 (CYANOCOBALAMIN) 1000 MCG tablet Take 2,000 mcg by mouth daily.         Allergies  Allergen Reactions  . Allegra [Fexofenadine]   . Codeine   . Levaquin [Levofloxacin In D5w]   . Lipitor [Atorvastatin Calcium]   . Lopressor [Metoprolol Tartrate] Other (See Comments)    HEART RACES  . Penicillins   . Potassium-Containing Compounds   . Procardia [Nifedipine]   . Sucralfate   . Sulfa Antibiotics   . Tramadol      Past Medical History:  Diagnosis  Date  . Anginal pain ([place])   . Arthritis   . B12 deficiency anemia   . Cervical disc disease   . Cervical disc disease   . Chest pain   . Cholecystolithiasis   . Colon polyp   . Coronary artery disease   . Cutaneous sarcoidosis   . Diabetes mellitus without complication ([place])   . Diabetic neuropathy ([place])   . GERD (gastroesophageal reflux disease)   . Hemorrhoid   . History of diabetic neuropathy   . Hypercalcemia   . Hyperlipemia   . Hypertension   . IBS (irritable bowel syndrome)   . Iron deficiency anemia   . Myocardial infarction ([place])   . Sleep apnea     Review of systems:  Otherwise negative.    Physical Exam  Gen: Alert, oriented. Appears stated age.  HEENT: [place]/AT. PERRLA. Lungs: CTA, no wheezes. CV: RR nl S1, S2. Abd: soft, benign, no  masses. BS+ Ext: No edema. Pulses 2+    Planned procedures: Proceed with EGD. The patient understands the nature of the planned procedure, indications, risks, alternatives and potential complications including but not limited to bleeding, infection, perforation, damage to internal organs and possible oversedation/side effects from anesthesia. The patient agrees and gives consent to proceed.  Please refer to procedure notes for findings, recommendations and patient disposition/instructions.     Rhonda Tyler K. [name], M.D. Gastroenterology 08/16/2019  9:32 AM

## 2019-08-16 NOTE — Transfer of Care (Signed)
Immediate Anesthesia Transfer of Care Note  Patient: Rhonda Tyler  Procedure(s) Performed: ESOPHAGOGASTRODUODENOSCOPY (EGD) WITH PROPOFOL (N/A )  Patient Location: PACU  Anesthesia Type:General  Level of Consciousness: sedated  Airway & Oxygen Therapy: Patient Spontanous Breathing  Post-op Assessment: Report given to RN and Post -op Vital signs reviewed and stable  Post vital signs: Reviewed and stable  Last Vitals:  Vitals Value Taken Time  BP 154/63 08/16/19 0949  Temp    Pulse 78 08/16/19 0949  Resp 24 08/16/19 0949  SpO2 97 % 08/16/19 0949  Vitals shown include unvalidated device data.  Last Pain:  Vitals:   08/16/19 0835  TempSrc: Temporal  PainSc: 4          Complications: No apparent anesthesia complications

## 2019-08-16 NOTE — Op Note (Signed)
[facility] Patient Name: Rhonda Tyler Procedure Date: 08/16/2019 9:12 AM MRN: [id number] Account #: [id number] Date of Birth: Oct 14, 1940 Admit Type: Outpatient Age: 79 Room: [facility] ENDO ROOM 3 Gender: Female Note Status: Finalized Procedure:             Upper GI endoscopy Indications:           Lower abdominal pain, Gastro-esophageal reflux                         disease, and benign gastric polyps Providers:             Benay Pike. Zannie Runkle MD, MD Medicines:             Propofol per Anesthesia Complications:         No immediate complications. Procedure:             Pre-Anesthesia Assessment:                        - The risks and benefits of the procedure and the                         sedation options and risks were discussed with the                         patient. All questions were answered and informed                         consent was obtained.                        - Patient identification and proposed procedure were                         verified prior to the procedure by the nurse. The                         procedure was verified in the procedure room.                        - ASA Grade Assessment: III - A patient with severe                         systemic disease.                        - After reviewing the risks and benefits, the patient                         was deemed in satisfactory condition to undergo the                         procedure.                        After obtaining informed consent, the endoscope was                         passed under direct vision. Throughout the procedure,  the patient's blood pressure, pulse, and oxygen                         saturations were monitored continuously. The Endoscope                         was introduced through the mouth, and advanced to the                         third part of duodenum. The upper GI endoscopy was   accomplished without difficulty. The patient tolerated                         the procedure well. Findings:      Prominent esophageal veins present. No clear evidence of esophageal       varices.      Multiple pedunculated and sessile polyps with no bleeding and no       stigmata of recent bleeding were found in the gastric body. Biopsies       were taken with a cold forceps for histology.      The examined duodenum was normal.      The exam was otherwise without abnormality. Impression:            - Multiple gastric polyps. Biopsied.                        - Normal examined duodenum.                        - The examination was otherwise normal. Recommendation:        - Patient has a contact number available for                         emergencies. The signs and symptoms of potential                         delayed complications were discussed with the patient.                         Return to normal activities tomorrow. Written                         discharge instructions were provided to the patient.                        - Resume previous diet.                        - Continue present medications.                        - Await pathology results.                        - If gastric biopsies benign, I will not advise                         further endoscopies as surveillance for these polyps  is generally not indicated.                        - The findings and recommendations were discussed with                         the patient. Procedure Code(s):     --- Professional ---                        [id number], Esophagogastroduodenoscopy, flexible,                         transoral; with biopsy, single or multiple Diagnosis Code(s):     --- Professional ---                        K21.9, Gastro-esophageal reflux disease without                         esophagitis                        R10.30, Lower abdominal pain, unspecified                        K31.7,  Polyp of stomach and duodenum CPT copyright 2019 American Medical Association. All rights reserved. The codes documented in this report are preliminary and upon coder review may  be revised to meet current compliance requirements. Efrain Sella MD, MD 08/16/2019 9:53:53 AM This report has been signed electronically. Number of Addenda: 0 Note Initiated On: 08/16/2019 9:12 AM Estimated Blood Loss:  Estimated blood loss: none.      [facility]

## 2019-08-16 NOTE — Anesthesia Postprocedure Evaluation (Signed)
Anesthesia Post Note  Patient: MARYFRANCES PORTUGAL  Procedure(s) Performed: ESOPHAGOGASTRODUODENOSCOPY (EGD) WITH PROPOFOL (N/A )  Patient location during evaluation: Endoscopy Anesthesia Type: General Level of consciousness: awake and alert and oriented Pain management: pain level controlled Vital Signs Assessment: post-procedure vital signs reviewed and stable Respiratory status: spontaneous breathing, nonlabored ventilation and respiratory function stable Cardiovascular status: blood pressure returned to baseline and stable Postop Assessment: no signs of nausea or vomiting Anesthetic complications: no     Last Vitals:  Vitals:   08/16/19 1009 08/16/19 1019  BP: (!) 151/76 (!) 164/68  Pulse: 73 73  Resp: 20 19  Temp:    SpO2: 100% 99%    Last Pain:  Vitals:   08/16/19 1019  TempSrc:   PainSc: 4                  Raye Wiens

## 2019-08-16 NOTE — Addendum Note (Signed)
Addendum  created 08/16/19 1050 by Nelda Marseille, CRNA   Intraprocedure Event edited

## 2019-08-17 ENCOUNTER — Encounter: Payer: Self-pay | Admitting: *Deleted

## 2019-08-17 LAB — SURGICAL PATHOLOGY

## 2019-09-05 DIAGNOSIS — G4733 Obstructive sleep apnea (adult) (pediatric): Secondary | ICD-10-CM | POA: Insufficient documentation

## 2019-09-05 DIAGNOSIS — Z9989 Dependence on other enabling machines and devices: Secondary | ICD-10-CM | POA: Insufficient documentation

## 2019-11-07 ENCOUNTER — Other Ambulatory Visit: Payer: Self-pay | Admitting: Gastroenterology

## 2019-11-07 DIAGNOSIS — R1011 Right upper quadrant pain: Secondary | ICD-10-CM

## 2019-11-07 DIAGNOSIS — R6881 Early satiety: Secondary | ICD-10-CM

## 2019-11-07 DIAGNOSIS — K30 Functional dyspepsia: Secondary | ICD-10-CM

## 2019-11-07 DIAGNOSIS — R140 Abdominal distension (gaseous): Secondary | ICD-10-CM

## 2019-11-09 DIAGNOSIS — K7460 Unspecified cirrhosis of liver: Secondary | ICD-10-CM

## 2019-11-09 HISTORY — DX: Unspecified cirrhosis of liver: K74.60

## 2019-11-15 ENCOUNTER — Ambulatory Visit
Admission: RE | Admit: 2019-11-15 | Discharge: 2019-11-15 | Disposition: A | Discharge status: Home / Self Care | Payer: Medicare Other | Source: Ambulatory Visit | Attending: Gastroenterology | Admitting: Gastroenterology

## 2019-11-15 ENCOUNTER — Other Ambulatory Visit: Payer: Self-pay

## 2019-11-15 DIAGNOSIS — R1011 Right upper quadrant pain: Secondary | ICD-10-CM | POA: Diagnosis present

## 2019-11-15 DIAGNOSIS — K30 Functional dyspepsia: Secondary | ICD-10-CM | POA: Insufficient documentation

## 2019-11-15 DIAGNOSIS — R6881 Early satiety: Secondary | ICD-10-CM | POA: Diagnosis present

## 2019-11-15 DIAGNOSIS — R140 Abdominal distension (gaseous): Secondary | ICD-10-CM | POA: Diagnosis present

## 2019-11-15 LAB — POCT I-STAT CREATININE: Creatinine, Ser: 1.3 mg/dL — ABNORMAL HIGH (ref 0.44–1.00)

## 2019-11-15 MED ORDER — IOHEXOL 300 MG/ML  SOLN: 100.0000 mL | Freq: Once | INTRAMUSCULAR | Status: DC | PRN

## 2019-11-15 MED ORDER — IOHEXOL 300 MG/ML  SOLN
80.0000 mL | Freq: Once | INTRAMUSCULAR | Status: AC | PRN
  Administered 2019-11-15: 80 mL via INTRAVENOUS

## 2019-11-17 ENCOUNTER — Other Ambulatory Visit: Payer: Self-pay | Admitting: Gastroenterology

## 2019-11-17 DIAGNOSIS — K7460 Unspecified cirrhosis of liver: Secondary | ICD-10-CM

## 2019-11-20 ENCOUNTER — Other Ambulatory Visit: Payer: Self-pay

## 2019-11-20 ENCOUNTER — Ambulatory Visit
Admission: RE | Admit: 2019-11-20 | Discharge: 2019-11-20 | Disposition: A | Discharge status: Home / Self Care | Payer: Medicare Other | Source: Ambulatory Visit | Attending: Gastroenterology | Admitting: Gastroenterology

## 2019-11-20 ENCOUNTER — Other Ambulatory Visit
Admission: RE | Admit: 2019-11-20 | Discharge: 2019-11-20 | Disposition: A | Discharge status: Home / Self Care | Payer: Medicare Other | Source: Home / Self Care | Attending: Gastroenterology | Admitting: Gastroenterology

## 2019-11-20 DIAGNOSIS — K7460 Unspecified cirrhosis of liver: Secondary | ICD-10-CM | POA: Diagnosis present

## 2019-11-20 DIAGNOSIS — R188 Other ascites: Secondary | ICD-10-CM | POA: Diagnosis present

## 2019-11-20 LAB — ALBUMIN: Albumin: 2.8 g/dL — ABNORMAL LOW (ref 3.5–5.0)

## 2019-11-20 LAB — BODY FLUID CELL COUNT WITH DIFFERENTIAL
Eos, Fluid: 0 %
Lymphs, Fluid: 77 %
Monocyte-Macrophage-Serous Fluid: 21 %
Neutrophil Count, Fluid: 2 %
Other Cells, Fluid: 0 %
Total Nucleated Cell Count, Fluid: 819 cu mm

## 2019-11-20 LAB — ALBUMIN, PLEURAL OR PERITONEAL FLUID: Albumin, Fluid: 1.1 g/dL

## 2019-11-20 LAB — SODIUM: Sodium: 137 mmol/L (ref 135–145)

## 2019-11-20 LAB — PROTEIN, PLEURAL OR PERITONEAL FLUID: Total protein, fluid: <3 g/dL

## 2019-11-20 NOTE — Procedures (Signed)
Ultrasound-guided diagnostic and therapeutic paracentesis performed yielding 4.22 liters of straw colored fluid.  Fluid was sent to lab for analysis. No immediate complications. EBL is none.

## 2019-11-21 LAB — PROTEIN, BODY FLUID (OTHER): Total Protein, Body Fluid Other: 2.2 g/dL

## 2019-11-21 LAB — CYTOLOGY - NON PAP

## 2019-11-23 LAB — BODY FLUID CULTURE
Culture: NO GROWTH
Gram Stain: NONE SEEN

## 2020-01-05 ENCOUNTER — Emergency Department: Payer: Medicare Other

## 2020-01-05 ENCOUNTER — Observation Stay
Admission: EM | Admit: 2020-01-05 | Discharge: 2020-01-06 | Disposition: A | Discharge status: Home / Self Care | Payer: Medicare Other | Attending: Internal Medicine | Admitting: Internal Medicine

## 2020-01-05 ENCOUNTER — Other Ambulatory Visit: Payer: Self-pay

## 2020-01-05 ENCOUNTER — Encounter: Payer: Self-pay | Admitting: Emergency Medicine

## 2020-01-05 DIAGNOSIS — I25119 Atherosclerotic heart disease of native coronary artery with unspecified angina pectoris: Secondary | ICD-10-CM | POA: Diagnosis not present

## 2020-01-05 DIAGNOSIS — K7581 Nonalcoholic steatohepatitis (NASH): Secondary | ICD-10-CM | POA: Diagnosis present

## 2020-01-05 DIAGNOSIS — K760 Fatty (change of) liver, not elsewhere classified: Secondary | ICD-10-CM | POA: Diagnosis not present

## 2020-01-05 DIAGNOSIS — Z79899 Other long term (current) drug therapy: Secondary | ICD-10-CM | POA: Insufficient documentation

## 2020-01-05 DIAGNOSIS — Z20822 Contact with and (suspected) exposure to covid-19: Secondary | ICD-10-CM | POA: Diagnosis not present

## 2020-01-05 DIAGNOSIS — E1140 Type 2 diabetes mellitus with diabetic neuropathy, unspecified: Secondary | ICD-10-CM | POA: Diagnosis not present

## 2020-01-05 DIAGNOSIS — K7469 Other cirrhosis of liver: Secondary | ICD-10-CM

## 2020-01-05 DIAGNOSIS — R079 Chest pain, unspecified: Principal | ICD-10-CM | POA: Diagnosis present

## 2020-01-05 DIAGNOSIS — N179 Acute kidney failure, unspecified: Secondary | ICD-10-CM | POA: Diagnosis present

## 2020-01-05 DIAGNOSIS — K7460 Unspecified cirrhosis of liver: Secondary | ICD-10-CM | POA: Diagnosis present

## 2020-01-05 DIAGNOSIS — Z87891 Personal history of nicotine dependence: Secondary | ICD-10-CM | POA: Diagnosis not present

## 2020-01-05 DIAGNOSIS — I10 Essential (primary) hypertension: Secondary | ICD-10-CM | POA: Diagnosis present

## 2020-01-05 DIAGNOSIS — Z794 Long term (current) use of insulin: Secondary | ICD-10-CM | POA: Insufficient documentation

## 2020-01-05 DIAGNOSIS — D509 Iron deficiency anemia, unspecified: Secondary | ICD-10-CM | POA: Diagnosis present

## 2020-01-05 DIAGNOSIS — N189 Chronic kidney disease, unspecified: Secondary | ICD-10-CM | POA: Diagnosis present

## 2020-01-05 DIAGNOSIS — Z955 Presence of coronary angioplasty implant and graft: Secondary | ICD-10-CM | POA: Diagnosis not present

## 2020-01-05 DIAGNOSIS — M19012 Primary osteoarthritis, left shoulder: Secondary | ICD-10-CM

## 2020-01-05 DIAGNOSIS — I252 Old myocardial infarction: Secondary | ICD-10-CM | POA: Insufficient documentation

## 2020-01-05 DIAGNOSIS — Z7982 Long term (current) use of aspirin: Secondary | ICD-10-CM | POA: Insufficient documentation

## 2020-01-05 DIAGNOSIS — E611 Iron deficiency: Secondary | ICD-10-CM

## 2020-01-05 LAB — HEPATIC FUNCTION PANEL
ALT: 17 U/L (ref 0–44)
AST: 28 U/L (ref 15–41)
Albumin: 3.7 g/dL (ref 3.5–5.0)
Alkaline Phosphatase: 52 U/L (ref 38–126)
Bilirubin, Direct: 0.2 mg/dL (ref 0.0–0.2)
Indirect Bilirubin: 0.7 mg/dL (ref 0.3–0.9)
Total Bilirubin: 0.9 mg/dL (ref 0.3–1.2)
Total Protein: 7.5 g/dL (ref 6.5–8.1)

## 2020-01-05 LAB — BASIC METABOLIC PANEL
Anion gap: 10 (ref 5–15)
BUN: 38 mg/dL — ABNORMAL HIGH (ref 8–23)
CO2: 26 mmol/L (ref 22–32)
Calcium: 10.1 mg/dL (ref 8.9–10.3)
Chloride: 101 mmol/L (ref 98–111)
Creatinine, Ser: 1.9 mg/dL — ABNORMAL HIGH (ref 0.44–1.00)
GFR calc Af Amer: 29 mL/min — ABNORMAL LOW (ref >60)
GFR calc non Af Amer: 25 mL/min — ABNORMAL LOW (ref >60)
Glucose, Bld: 175 mg/dL — ABNORMAL HIGH (ref 70–99)
Potassium: 5 mmol/L (ref 3.5–5.1)
Sodium: 137 mmol/L (ref 135–145)

## 2020-01-05 LAB — HEMOGLOBIN A1C
Hgb A1c MFr Bld: 5.8 % — ABNORMAL HIGH (ref 4.8–5.6)
Mean Plasma Glucose: 119.76 mg/dL

## 2020-01-05 LAB — CBC
HCT: 35.8 % — ABNORMAL LOW (ref 36.0–46.0)
Hemoglobin: 12 g/dL (ref 12.0–15.0)
MCH: 27.6 pg (ref 26.0–34.0)
MCHC: 33.5 g/dL (ref 30.0–36.0)
MCV: 82.5 fL (ref 80.0–100.0)
Platelets: 157 10*3/uL (ref 150–400)
RBC: 4.34 MIL/uL (ref 3.87–5.11)
RDW: 15.9 % — ABNORMAL HIGH (ref 11.5–15.5)
WBC: 3.4 10*3/uL — ABNORMAL LOW (ref 4.0–10.5)
nRBC: 0 % (ref 0.0–0.2)

## 2020-01-05 LAB — TROPONIN I (HIGH SENSITIVITY)
Troponin I (High Sensitivity): 4 ng/L (ref <18)
Troponin I (High Sensitivity): 6 ng/L (ref <18)
Troponin I (High Sensitivity): 7 ng/L (ref <18)
Troponin I (High Sensitivity): 9 ng/L (ref <18)

## 2020-01-05 LAB — SARS CORONAVIRUS 2 BY RT PCR (HOSPITAL ORDER, PERFORMED IN ~~LOC~~ HOSPITAL LAB): SARS Coronavirus 2: NEGATIVE

## 2020-01-05 LAB — BRAIN NATRIURETIC PEPTIDE: B Natriuretic Peptide: 55.9 pg/mL (ref 0.0–100.0)

## 2020-01-05 LAB — GLUCOSE, CAPILLARY
Glucose-Capillary: 81 mg/dL (ref 70–99)
Glucose-Capillary: 152 mg/dL — ABNORMAL HIGH (ref 70–99)

## 2020-01-05 LAB — FIBRIN DERIVATIVES D-DIMER (ARMC ONLY): Fibrin derivatives D-dimer (ARMC): 716.4 ng/mL (FEU) — ABNORMAL HIGH (ref 0.00–499.00)

## 2020-01-05 MED ORDER — ALLOPURINOL 100 MG PO TABS: 100.0000 mg | ORAL_TABLET | Freq: Every day | ORAL | Status: DC

## 2020-01-05 MED ORDER — ACETAMINOPHEN 325 MG PO TABS
650.0000 mg | ORAL_TABLET | ORAL | Status: DC | PRN
  Administered 2020-01-05 – 2020-01-06 (×2): 650 mg via ORAL
  Filled 2020-01-05 (×3): qty 2

## 2020-01-05 MED ORDER — ASPIRIN EC 81 MG PO TBEC
81.0000 mg | DELAYED_RELEASE_TABLET | Freq: Every day | ORAL | Status: DC
  Administered 2020-01-05 – 2020-01-06 (×2): 81 mg via ORAL
  Filled 2020-01-05 (×2): qty 1

## 2020-01-05 MED ORDER — ENOXAPARIN SODIUM 40 MG/0.4ML ~~LOC~~ SOLN: 40.0000 mg | SUBCUTANEOUS | Status: DC

## 2020-01-05 MED ORDER — FENOFIBRATE 160 MG PO TABS: 160.0000 mg | ORAL_TABLET | Freq: Every day | ORAL | Status: DC

## 2020-01-05 MED ORDER — INSULIN ASPART 100 UNIT/ML ~~LOC~~ SOLN
0.0000 [IU] | Freq: Three times a day (TID) | SUBCUTANEOUS | Status: DC
  Administered 2020-01-06: 1 [IU] via SUBCUTANEOUS
  Administered 2020-01-06: 3 [IU] via SUBCUTANEOUS
  Filled 2020-01-05 (×2): qty 1

## 2020-01-05 MED ORDER — SODIUM CHLORIDE 0.9 % IV SOLN: Freq: Once | INTRAVENOUS | Status: AC

## 2020-01-05 MED ORDER — FENOFIBRATE 160 MG PO TABS
160.0000 mg | ORAL_TABLET | Freq: Every day | ORAL | Status: DC
  Administered 2020-01-06: 160 mg via ORAL
  Filled 2020-01-05 (×2): qty 1

## 2020-01-05 MED ORDER — NITROGLYCERIN 0.4 MG SL SUBL
0.4000 mg | SUBLINGUAL_TABLET | SUBLINGUAL | Status: DC | PRN
  Administered 2020-01-05: 0.4 mg via SUBLINGUAL
  Filled 2020-01-05: qty 1

## 2020-01-05 MED ORDER — ONDANSETRON 4 MG PO TBDP
4.0000 mg | ORAL_TABLET | Freq: Once | ORAL | Status: AC
  Administered 2020-01-05: 4 mg via ORAL
  Filled 2020-01-05: qty 1

## 2020-01-05 MED ORDER — ONDANSETRON HCL 4 MG/2ML IJ SOLN
4.0000 mg | Freq: Four times a day (QID) | INTRAMUSCULAR | Status: DC | PRN
  Administered 2020-01-05 – 2020-01-06 (×3): 4 mg via INTRAVENOUS
  Filled 2020-01-05 (×2): qty 2

## 2020-01-05 MED ORDER — POTASSIUM CHLORIDE CRYS ER 20 MEQ PO TBCR
20.0000 meq | EXTENDED_RELEASE_TABLET | Freq: Every day | ORAL | Status: DC
  Administered 2020-01-05: 20 meq via ORAL
  Filled 2020-01-05: qty 1

## 2020-01-05 MED ORDER — PANTOPRAZOLE SODIUM 40 MG PO TBEC
40.0000 mg | DELAYED_RELEASE_TABLET | Freq: Two times a day (BID) | ORAL | Status: DC
  Administered 2020-01-05 – 2020-01-06 (×2): 40 mg via ORAL
  Filled 2020-01-05 (×3): qty 1

## 2020-01-05 MED ORDER — PROPRANOLOL HCL 20 MG PO TABS
40.0000 mg | ORAL_TABLET | Freq: Two times a day (BID) | ORAL | Status: DC
  Administered 2020-01-05 – 2020-01-06 (×3): 40 mg via ORAL
  Filled 2020-01-05 (×3): qty 2

## 2020-01-05 MED ORDER — MAGNESIUM OXIDE 400 (241.3 MG) MG PO TABS
400.0000 mg | ORAL_TABLET | Freq: Two times a day (BID) | ORAL | Status: DC
  Administered 2020-01-05 – 2020-01-06 (×2): 400 mg via ORAL
  Filled 2020-01-05 (×2): qty 1

## 2020-01-05 MED ORDER — LIDOCAINE VISCOUS HCL 2 % MT SOLN
15.0000 mL | Freq: Once | OROMUCOSAL | Status: AC
  Administered 2020-01-05: 15 mL via ORAL
  Filled 2020-01-05: qty 15

## 2020-01-05 MED ORDER — SODIUM CHLORIDE 0.9 % IV BOLUS: 1000.0000 mL | Freq: Once | INTRAVENOUS | Status: DC

## 2020-01-05 MED ORDER — VITAMIN B-12 1000 MCG PO TABS
2000.0000 ug | ORAL_TABLET | Freq: Every day | ORAL | Status: DC
  Administered 2020-01-06: 2000 ug via ORAL
  Filled 2020-01-05 (×2): qty 2

## 2020-01-05 MED ORDER — ALLOPURINOL 100 MG PO TABS
100.0000 mg | ORAL_TABLET | Freq: Every day | ORAL | Status: DC
  Administered 2020-01-06: 100 mg via ORAL
  Filled 2020-01-05 (×2): qty 1

## 2020-01-05 MED ORDER — SODIUM CHLORIDE 0.9 % IV SOLN: Freq: Once | INTRAVENOUS | Status: DC

## 2020-01-05 MED ORDER — MORPHINE SULFATE (PF) 2 MG/ML IV SOLN
2.0000 mg | Freq: Once | INTRAVENOUS | Status: AC
  Administered 2020-01-05: 2 mg via INTRAVENOUS
  Filled 2020-01-05: qty 1

## 2020-01-05 MED ORDER — INSULIN ASPART PROT & ASPART (70-30 MIX) 100 UNIT/ML ~~LOC~~ SUSP
30.0000 [IU] | Freq: Two times a day (BID) | SUBCUTANEOUS | Status: DC
  Administered 2020-01-06: 30 [IU] via SUBCUTANEOUS
  Filled 2020-01-05: qty 10

## 2020-01-05 MED ORDER — HYDROXYCHLOROQUINE SULFATE 200 MG PO TABS
200.0000 mg | ORAL_TABLET | Freq: Every day | ORAL | Status: DC
  Administered 2020-01-05 – 2020-01-06 (×2): 200 mg via ORAL
  Filled 2020-01-05 (×3): qty 1

## 2020-01-05 MED ORDER — LORATADINE 10 MG PO TABS
10.0000 mg | ORAL_TABLET | Freq: Every day | ORAL | Status: DC
  Administered 2020-01-05 – 2020-01-06 (×2): 10 mg via ORAL
  Filled 2020-01-05 (×2): qty 1

## 2020-01-05 MED ORDER — SPIRONOLACTONE 25 MG PO TABS: 50.0000 mg | ORAL_TABLET | Freq: Every day | ORAL | Status: DC

## 2020-01-05 MED ORDER — ENOXAPARIN SODIUM 40 MG/0.4ML ~~LOC~~ SOLN
40.0000 mg | SUBCUTANEOUS | Status: DC
  Administered 2020-01-05: 40 mg via SUBCUTANEOUS
  Filled 2020-01-05: qty 0.4

## 2020-01-05 MED ORDER — ACETAMINOPHEN 500 MG PO TABS
500.0000 mg | ORAL_TABLET | ORAL | Status: DC | PRN
  Administered 2020-01-06: 500 mg via ORAL
  Filled 2020-01-05: qty 2

## 2020-01-05 MED ORDER — INSULIN ASPART 100 UNIT/ML ~~LOC~~ SOLN: 0.0000 [IU] | Freq: Every day | SUBCUTANEOUS | Status: DC

## 2020-01-05 MED ORDER — HYDROCODONE-ACETAMINOPHEN 5-325 MG PO TABS
1.0000 | ORAL_TABLET | Freq: Once | ORAL | Status: AC
  Administered 2020-01-05: 1 via ORAL
  Filled 2020-01-05: qty 1

## 2020-01-05 MED ORDER — VITAMIN B-12 1000 MCG PO TABS: 2000.0000 ug | ORAL_TABLET | Freq: Every day | ORAL | Status: DC

## 2020-01-05 MED ORDER — ALUM & MAG HYDROXIDE-SIMETH 200-200-20 MG/5ML PO SUSP
30.0000 mL | Freq: Once | ORAL | Status: AC
  Administered 2020-01-05: 30 mL via ORAL
  Filled 2020-01-05: qty 30

## 2020-01-05 MED ORDER — DICYCLOMINE HCL 10 MG/5ML PO SOLN
10.0000 mg | Freq: Once | ORAL | Status: AC
  Administered 2020-01-05: 10 mg via ORAL
  Filled 2020-01-05: qty 5

## 2020-01-05 NOTE — ED Triage Notes (Signed)
Says chest pain left side under breatst and down left arm for 2 days.  Was really bad last night and she could not sleep. Sharp at times and just steady.

## 2020-01-05 NOTE — Consult Note (Signed)
[facility] Consultation Note  Patient ID: Rhonda Tyler, MRN: [id number], DOB/AGE: 02-06-1941 79 y.o. Admit date: 01/05/2020   Date of Consult: 01/05/2020 Primary Physician: Kirk Ruths, MD Primary Cardiologist: Fath  Chief Complaint:  Chief Complaint  Patient presents with  . Chest Pain   Reason for Consult: Chest pain  HPI: 79 y.o. female with known coronary artery disease status post coronary bypass graft hypertension hyperlipidemia chronic kidney disease stage IV for which the patient has been on appropriate medication management for further risk reduction cardiovascular event.  Overall she has done fairly well for quite a number of years but has had some waxing and waning of some chest discomfort on the left side radiating into her arm.  This has been occurring of the last 4 days for which she had several hours of chest discomfort last night.  She was seen in the urgent emergency room at that time with an EKG showing normal sinus rhythm.  Troponin has been peaked at 4 BNP 55 GFR 29.  Chest x-ray was normal without evidence of heart failure.  Currently she is completely pain-free and has had no further significant symptoms.  Past Medical History:  Diagnosis Date  . Anginal pain ([place])   . Arthritis   . B12 deficiency anemia   . Cervical disc disease   . Cervical disc disease   . Chest pain   . Cholecystolithiasis   . Colon polyp   . Coronary artery disease   . Cutaneous sarcoidosis   . Diabetes mellitus without complication ([place])    Pt takes insulin  . Diabetic neuropathy ([place])   . GERD (gastroesophageal reflux disease)   . Hemorrhoid   . History of diabetic neuropathy   . Hypercalcemia   . Hyperlipemia   . Hypertension   . IBS (irritable bowel syndrome)   . Iron deficiency anemia   . Myocardial infarction ([place])   . Sleep apnea       Surgical History:  Past Surgical History:  Procedure Laterality Date  . ABDOMINAL HYSTERECTOMY    .  arthroscopic rotator cuff repair    . BACK SURGERY    . CARDIAC CATHETERIZATION    . CHOLECYSTECTOMY    . COLONOSCOPY    . COLONOSCOPY WITH PROPOFOL N/A 11/29/2017   Procedure: COLONOSCOPY WITH PROPOFOL;  Surgeon: Lollie Sails, MD;  Location: [facility] ENDOSCOPY;  Service: Endoscopy;  Laterality: N/A;  . CORONARY ANGIOPLASTY     PTCA and stent of RCA  . CORONARY ARTERY BYPASS GRAFT    . ESOPHAGOGASTRODUODENOSCOPY    . ESOPHAGOGASTRODUODENOSCOPY (EGD) WITH PROPOFOL N/A 11/29/2017   Procedure: ESOPHAGOGASTRODUODENOSCOPY (EGD) WITH PROPOFOL;  Surgeon: Lollie Sails, MD;  Location: [facility] ENDOSCOPY;  Service: Endoscopy;  Laterality: N/A;  . ESOPHAGOGASTRODUODENOSCOPY (EGD) WITH PROPOFOL N/A 03/07/2018   Procedure: ESOPHAGOGASTRODUODENOSCOPY (EGD) WITH PROPOFOL;  Surgeon: Lollie Sails, MD;  Location: [facility] ENDOSCOPY;  Service: Endoscopy;  Laterality: N/A;  . ESOPHAGOGASTRODUODENOSCOPY (EGD) WITH PROPOFOL N/A 08/16/2019   Procedure: ESOPHAGOGASTRODUODENOSCOPY (EGD) WITH PROPOFOL;  Surgeon: [place], Benay Pike, MD;  Location: [facility] ENDOSCOPY;  Service: Gastroenterology;  Laterality: N/A;  . EXCISION OF ADNEXAL MASS    . JOINT REPLACEMENT     left total knee replacement  . OOPHORECTOMY    . vesicular vaginal fistula       Home Meds: Prior to Admission medications   Medication Sig Start Date End Date Taking? Authorizing Provider  acetaminophen (TYLENOL) 500 MG tablet Take 500-1,000 mg by mouth every 4 (  four) hours as needed for mild pain or fever.    Yes [provider]  allopurinol (ZYLOPRIM) 100 MG tablet Take 100 mg by mouth daily.   Yes [provider]  aspirin EC 81 MG tablet Take 81 mg by mouth daily.   Yes [provider]  fenofibrate 160 MG tablet Take 160 mg by mouth daily.   Yes [provider]  furosemide (LASIX) 20 MG tablet Take 20 mg by mouth daily.   Yes [provider]  hydroxychloroquine (PLAQUENIL) 200 MG tablet Take 200 mg by  mouth daily. 12/29/19  Yes [provider]  insulin NPH-regular Human (NOVOLIN 70/30) (70-30) 100 UNIT/ML injection Inject 30-45 Units into the skin See admin instructions. Inject up to 45u under the skin every morning and up to 30u under the skin every night   Yes [provider]  insulin regular (NOVOLIN R,HUMULIN R) 100 units/mL injection Inject 4-10 Units into the skin daily before lunch.   Yes [provider]  lisinopril (PRINIVIL,ZESTRIL) 40 MG tablet Take 40 mg by mouth daily.   Yes [provider]  loratadine (CLARITIN) 10 MG tablet Take 10 mg by mouth daily.   Yes [provider]  magnesium oxide (MAG-OX) 400 MG tablet Take 400 mg by mouth 2 (two) times daily.    Yes [provider]  nitroGLYCERIN (NITROSTAT) 0.4 MG SL tablet Place 0.4 mg under the tongue every 5 (five) minutes as needed for chest pain.   Yes [provider]  pantoprazole (PROTONIX) 40 MG tablet Take 40 mg by mouth 2 (two) times daily.   Yes [provider]  potassium chloride SA (K-DUR,KLOR-CON) 20 MEQ tablet Take 20 mEq by mouth daily.   Yes [provider]  propranolol (INDERAL) 40 MG tablet Take 40 mg by mouth 2 (two) times daily.    Yes [provider]  spironolactone (ALDACTONE) 50 MG tablet Take 50 mg by mouth daily.   Yes [provider]  vitamin B-12 (CYANOCOBALAMIN) 1000 MCG tablet Take 2,000 mcg by mouth daily.    Yes [provider]    Inpatient Medications:  . [START ON 01/06/2020] allopurinol  100 mg Oral Daily  . aspirin EC  81 mg Oral Daily  . enoxaparin (LOVENOX) injection  40 mg Subcutaneous Q24H  . [START ON 01/06/2020] fenofibrate  160 mg Oral Daily  . hydroxychloroquine  200 mg Oral Daily  . insulin aspart  0-5 Units Subcutaneous QHS  . insulin aspart  0-9 Units Subcutaneous TID WC  . insulin aspart protamine- aspart  30 Units Subcutaneous BID WC  . loratadine  10 mg Oral Daily  . magnesium  oxide  400 mg Oral BID  . pantoprazole  40 mg Oral BID  . potassium chloride SA  20 mEq Oral Daily  . propranolol  40 mg Oral BID  . [START ON 01/06/2020] spironolactone  50 mg Oral Daily  . [START ON 01/06/2020] vitamin B-12  2,000 mcg Oral Daily     Allergies:  Allergies  Allergen Reactions  . Allegra [Fexofenadine]   . Codeine   . Levaquin [Levofloxacin In D5w]   . Lipitor [Atorvastatin Calcium]   . Lopressor [Metoprolol Tartrate] Other (See Comments)    HEART RACES  . Penicillins   . Potassium-Containing Compounds   . Procardia [Nifedipine]   . Sucralfate   . Sulfa Antibiotics   . Tramadol     Social History   Socioeconomic History  . Marital status: Married  Spouse name: Not on file  . Number of children: Not on file  . Years of education: Not on file  . Highest education level: Not on file  Occupational History  . Occupation: retired  Tobacco Use  . Smoking status: Former Smoker    Types: Cigarettes    Quit date: 12/01/1970    Years since quitting: 49.1  . Smokeless tobacco: Never Used  Vaping Use  . Vaping Use: Never used  Substance and Sexual Activity  . Alcohol use: Not Currently  . Drug use: Never  . Sexual activity: Not on file  Other Topics Concern  . Not on file  Social History Narrative  . Not on file   Social Determinants of Health   Financial Resource Strain:   . Difficulty of Paying Living Expenses: Not on file  Food Insecurity:   . Worried About Charity fundraiser in the Last Year: Not on file  . Ran Out of Food in the Last Year: Not on file  Transportation Needs:   . Lack of Transportation (Medical): Not on file  . Lack of Transportation (Non-Medical): Not on file  Physical Activity:   . Days of Exercise per Week: Not on file  . Minutes of Exercise per Session: Not on file  Stress:   . Feeling of Stress : Not on file  Social Connections:   . Frequency of Communication with Friends and Family: Not on file  . Frequency of Social  Gatherings with Friends and Family: Not on file  . Attends Religious Services: Not on file  . Active Member of Clubs or Organizations: Not on file  . Attends Archivist Meetings: Not on file  . Marital Status: Not on file  Intimate Partner Violence:   . Fear of Current or Ex-Partner: Not on file  . Emotionally Abused: Not on file  . Physically Abused: Not on file  . Sexually Abused: Not on file     Family History  Problem Relation Age of Onset  . Hypertension Mother   . Heart attack Mother   . Hypertension Father   . Heart attack Father   . Skin cancer Father   . Heart attack Sister   . Breast cancer Neg Hx      Review of Systems Positive for chest pain Negative for: General:  chills, fever, night sweats or weight changes.  Cardiovascular: PND orthopnea syncope dizziness  Dermatological skin lesions rashes Respiratory: Cough congestion Urologic: Frequent urination urination at night and hematuria Abdominal: negative for nausea, vomiting, diarrhea, bright red blood per rectum, melena, or hematemesis Neurologic: negative for visual changes, and/or hearing changes  All other systems reviewed and are otherwise negative except as noted above.  Labs: No results for input(s): CKTOTAL, CKMB, TROPONINI in the last 72 hours. Lab Results  Component Value Date   WBC 3.4 (L) 01/05/2020   HGB 12.0 01/05/2020   HCT 35.8 (L) 01/05/2020   MCV 82.5 01/05/2020   PLT 157 01/05/2020    Recent Labs  Lab 01/05/20 0900  NA 137  K 5.0  CL 101  CO2 26  BUN 38*  CREATININE 1.90*  CALCIUM 10.1  PROT 7.5  BILITOT 0.9  ALKPHOS 52  ALT 17  AST 28  GLUCOSE 175*   No results found for: CHOL, HDL, LDLCALC, TRIG No results found for: DDIMER  Radiology/Studies:  DG Chest 2 View  Result Date: 01/05/2020 CLINICAL DATA:  79 year old female with chest pain for 3 days, progressed this morning.  EXAM: CHEST - 2 VIEW COMPARISON:  02/13/2019 radiographs and earlier. FINDINGS: Long  segment previous cervical spine surgery with corpectomy. Visible hardware appears grossly stable. Stable prior sternotomy. Cardiac size and mediastinal contours are within normal limits. Visualized tracheal air column is within normal limits. Lung volumes are stable with no pneumothorax, pulmonary edema, pleural effusion or confluent pulmonary opacity. Negative visible bowel gas pattern.  Stable cholecystectomy clips. Stable visualized osseous structures. Chronic benign appearing medullary sclerosis of the proximal right humerus. IMPRESSION: No acute cardiopulmonary abnormality. Electronically Signed   By: Genevie Ann M.D.   On: 01/05/2020 09:27    EKG: Normal sinus rhythm  Weights: There were no vitals filed for this visit.   Physical Exam: Blood pressure (!) 123/54, pulse 73, temperature 97.8 F (36.6 C), temperature source Oral, resp. rate 16, SpO2 97 %. There is no height or weight on file to calculate BMI. General: Well developed, well nourished, in no acute distress. Head eyes ears nose throat: Normocephalic, atraumatic, sclera non-icteric, no xanthomas, nares are without discharge. No apparent thyromegaly and/or mass  Lungs: Normal respiratory effort.  , no rales, no rhonchi.  Heart: RRR with normal S1 S2. no murmur gallop, no rub, PMI is normal size and placement, carotid upstroke normal without bruit, jugular venous pressure is normal Abdomen: Soft, non-tender, non-distended with normoactive bowel sounds. No hepatomegaly. No rebound/guarding. No obvious abdominal masses. Abdominal aorta is normal size without bruit Extremities: Trace edema. no cyanosis, no clubbing, no ulcers  Peripheral : 2+ bilateral upper extremity pulses, 2+ bilateral femoral pulses, 2+ bilateral dorsal pedal pulse Neuro: Alert and oriented. No facial asymmetry. No focal deficit. Moves all extremities spontaneously. Musculoskeletal: Normal muscle tone without kyphosis Psych:  Responds to questions appropriately with a  normal affect.    Assessment: 79 year old female with chronic kidney disease hypertension hyperlipidemia diabetes status post coronary bypass graft having atypical chest discomfort without current evidence of new EKG changes myocardial infarction and/or acute coronary syndrome or congestive heart failure  Plan: 1.  Continue serial ECG and enzymes to assess for possible myocardial infarction 2.  Angina and current medical regimen for hypertension, diabetes, and hyperlipidemia treatment 3.  Consider echocardiogram for LV systolic dysfunction valvular heart disease contributing to above 4.  Addition of isosorbide for chest discomfort 5.  Further consideration if patient is ambulating well throughout this afternoon into tomorrow morning okay for possible discharged home from cardiac standpoint with follow-up next week for further adjustments of medication management and or intervention if recurrence of discomfort  Signed, Corey Skains M.D. [facility] 01/05/2020, 2:17 PM

## 2020-01-05 NOTE — ED Provider Notes (Signed)
[facility] Emergency Department Provider Note   ____________________________________________   First MD Initiated Contact with Patient 01/05/20 0930     (approximate)  I have reviewed the triage vital signs and the nursing notes.   HISTORY  Chief Complaint Chest Pain    HPI Rhonda Tyler is a 79 y.o. female patient reports pain in the left chest under the breast and down into the left arm for 2 days.  Seems to be coming and going.  Nothing seems to make it better or worse.  She does get short of breath and nauseated with it though.  Last night it came on and lasted for quite some time hours.  Before it improved.  Still there now.  She could not sleep last night because of the pain.  It feels sharp at times which is steady.  It does not seem to be made worse with deep breathing.  It is moderate now was severe last night.        Past Medical History:  Diagnosis Date  . Anginal pain ([place])   . Arthritis   . B12 deficiency anemia   . Cervical disc disease   . Cervical disc disease   . Chest pain   . Cholecystolithiasis   . Colon polyp   . Coronary artery disease   . Cutaneous sarcoidosis   . Diabetes mellitus without complication ([place])    Pt takes insulin  . Diabetic neuropathy ([place])   . GERD (gastroesophageal reflux disease)   . Hemorrhoid   . History of diabetic neuropathy   . Hypercalcemia   . Hyperlipemia   . Hypertension   . IBS (irritable bowel syndrome)   . Iron deficiency anemia   . Myocardial infarction ([place])   . Sleep apnea     Patient Active Problem List   Diagnosis Date Noted  . Chest pain 01/05/2020  . AKI (acute kidney injury) ([place])   . Essential hypertension   . Other cirrhosis of liver ([place])   . Hypercalcemia 02/13/2019  . Iron deficiency anemia 06/30/2018  . Acute respiratory failure with hypoxia ([place]) 03/23/2018    Past Surgical History:  Procedure Laterality Date  . ABDOMINAL HYSTERECTOMY    . arthroscopic  rotator cuff repair    . BACK SURGERY    . CARDIAC CATHETERIZATION    . CHOLECYSTECTOMY    . COLONOSCOPY    . COLONOSCOPY WITH PROPOFOL N/A 11/29/2017   Procedure: COLONOSCOPY WITH PROPOFOL;  Surgeon: Lollie Sails, MD;  Location: [facility] ENDOSCOPY;  Service: Endoscopy;  Laterality: N/A;  . CORONARY ANGIOPLASTY     PTCA and stent of RCA  . CORONARY ARTERY BYPASS GRAFT    . ESOPHAGOGASTRODUODENOSCOPY    . ESOPHAGOGASTRODUODENOSCOPY (EGD) WITH PROPOFOL N/A 11/29/2017   Procedure: ESOPHAGOGASTRODUODENOSCOPY (EGD) WITH PROPOFOL;  Surgeon: Lollie Sails, MD;  Location: [facility] ENDOSCOPY;  Service: Endoscopy;  Laterality: N/A;  . ESOPHAGOGASTRODUODENOSCOPY (EGD) WITH PROPOFOL N/A 03/07/2018   Procedure: ESOPHAGOGASTRODUODENOSCOPY (EGD) WITH PROPOFOL;  Surgeon: Lollie Sails, MD;  Location: [facility] ENDOSCOPY;  Service: Endoscopy;  Laterality: N/A;  . ESOPHAGOGASTRODUODENOSCOPY (EGD) WITH PROPOFOL N/A 08/16/2019   Procedure: ESOPHAGOGASTRODUODENOSCOPY (EGD) WITH PROPOFOL;  Surgeon: Toledo, Benay Pike, MD;  Location: [facility] ENDOSCOPY;  Service: Gastroenterology;  Laterality: N/A;  . EXCISION OF ADNEXAL MASS    . JOINT REPLACEMENT     left total knee replacement  . OOPHORECTOMY    . vesicular vaginal fistula      Prior to Admission medications  Medication Sig Start Date End Date Taking? Authorizing Provider  acetaminophen (TYLENOL) 500 MG tablet Take 500-1,000 mg by mouth every 4 (four) hours as needed for mild pain or fever.    Yes [provider]  allopurinol (ZYLOPRIM) 100 MG tablet Take 100 mg by mouth daily.   Yes [provider]  aspirin EC 81 MG tablet Take 81 mg by mouth daily.   Yes [provider]  fenofibrate 160 MG tablet Take 160 mg by mouth daily.   Yes [provider]  furosemide (LASIX) 20 MG tablet Take 20 mg by mouth daily.   Yes [provider]  hydroxychloroquine (PLAQUENIL) 200 MG tablet Take 200 mg by mouth daily. 12/29/19  Yes  [provider]  insulin NPH-regular Human (NOVOLIN 70/30) (70-30) 100 UNIT/ML injection Inject 30-45 Units into the skin See admin instructions. Inject up to 45u under the skin every morning and up to 30u under the skin every night   Yes [provider]  insulin regular (NOVOLIN R,HUMULIN R) 100 units/mL injection Inject 4-10 Units into the skin daily before lunch.   Yes [provider]  lisinopril (PRINIVIL,ZESTRIL) 40 MG tablet Take 40 mg by mouth daily.   Yes [provider]  loratadine (CLARITIN) 10 MG tablet Take 10 mg by mouth daily.   Yes [provider]  magnesium oxide (MAG-OX) 400 MG tablet Take 400 mg by mouth 2 (two) times daily.    Yes [provider]  nitroGLYCERIN (NITROSTAT) 0.4 MG SL tablet Place 0.4 mg under the tongue every 5 (five) minutes as needed for chest pain.   Yes [provider]  pantoprazole (PROTONIX) 40 MG tablet Take 40 mg by mouth 2 (two) times daily.   Yes [provider]  potassium chloride SA (K-DUR,KLOR-CON) 20 MEQ tablet Take 20 mEq by mouth daily.   Yes [provider]  propranolol (INDERAL) 40 MG tablet Take 40 mg by mouth 2 (two) times daily.    Yes [provider]  spironolactone (ALDACTONE) 50 MG tablet Take 50 mg by mouth daily.   Yes [provider]  vitamin B-12 (CYANOCOBALAMIN) 1000 MCG tablet Take 2,000 mcg by mouth daily.    Yes [provider]    Allergies Allegra [fexofenadine], Codeine, Levaquin [levofloxacin in d5w], Lipitor [atorvastatin calcium], Lopressor [metoprolol tartrate], Penicillins, Potassium-containing compounds, Procardia [nifedipine], Sucralfate, Sulfa antibiotics, and Tramadol  Family History  Problem Relation Age of Onset  . Hypertension Mother   . Heart attack Mother   . Hypertension Father   . Heart attack Father   . Skin cancer Father   . Heart attack Sister   . Breast cancer Neg Hx     Social History Social  History   Tobacco Use  . Smoking status: Former Smoker    Types: Cigarettes    Quit date: 12/01/1970    Years since quitting: 49.1  . Smokeless tobacco: Never Used  Vaping Use  . Vaping Use: Never used  Substance Use Topics  . Alcohol use: Not Currently  . Drug use: Never    Review of Systems  Constitutional: No fever/chills Eyes: No visual changes. ENT: No sore throat. Cardiovascular:  chest pain. Respiratory:  shortness of breath. Gastrointestinal: No abdominal pain.  No nausea, no vomiting.  No diarrhea.  No constipation. Genitourinary: Negative for dysuria. Musculoskeletal: Negative for back pain. Skin: Negative for rash. Neurological: Negative for focal weakness   ____________________________________________   PHYSICAL EXAM:  VITAL SIGNS: ED Triage Vitals  Enc Vitals Group     BP 01/05/20 0859 (!) 126/40     Pulse Rate 01/05/20 0859 64     Resp 01/05/20 0859 18     Temp 01/05/20 0859 97.8 F (36.6 C)     Temp Source 01/05/20 0859 Oral     SpO2 01/05/20 0859 100 %     Weight --      Height --      Head Circumference --      Peak Flow --      Pain Score 01/05/20 0858 5     Pain Loc --      Pain Edu? --      Excl. in [place]? --     Constitutional: Alert and oriented. Well appearing and in no acute distress. Eyes: Conjunctivae are normal. PER. EOMI. Head: Atraumatic. Nose: No congestion/rhinnorhea. Mouth/Throat: Mucous membranes are moist.  Oropharynx non-erythematous. Neck: No stridor Cardiovascular: Normal rate, regular rhythm. Grossly normal heart sounds.  Good peripheral circulation. Respiratory: Normal respiratory effort.  No retractions. Lungs CTAB. Gastrointestinal: Soft and nontender. No distention. No abdominal bruits. No CVA tenderness. Musculoskeletal: No lower extremity tenderness nor edema.  . Neurologic:  Normal speech and language. No gross focal neurologic deficits are appreciated. N Skin:  Skin is warm, dry and intact. No rash  noted.   ____________________________________________   LABS (all labs ordered are listed, but only abnormal results are displayed)  Labs Reviewed  BASIC METABOLIC PANEL - Abnormal; Notable for the following components:      Result Value   Glucose, Bld 175 (*)    BUN 38 (*)    Creatinine, Ser 1.90 (*)    GFR calc non Af Amer 25 (*)    GFR calc Af Amer 29 (*)    All other components within normal limits  CBC - Abnormal; Notable for the following components:   WBC 3.4 (*)    HCT 35.8 (*)    RDW 15.9 (*)    All other components within normal limits  FIBRIN DERIVATIVES D-DIMER ([facility] ONLY) - Abnormal; Notable for the following components:   Fibrin derivatives D-dimer ([facility]) 716.40 (*)    All other components within normal limits  HEMOGLOBIN A1C - Abnormal; Notable for the following components:   Hgb A1c MFr Bld 5.8 (*)    All other components within normal limits  SARS CORONAVIRUS 2 BY RT PCR (HOSPITAL ORDER, [place] [facility])  BRAIN NATRIURETIC PEPTIDE  HEPATIC FUNCTION PANEL  TROPONIN I (HIGH SENSITIVITY)  TROPONIN I (HIGH SENSITIVITY)  TROPONIN I (HIGH SENSITIVITY)  TROPONIN I (HIGH SENSITIVITY)   ____________________________________________  EKG  EKG read interpreted by me shows normal sinus rhythm rate of 65 normal axis normal EKG except for decreased amplitude in the V leads. ____________________________________________  RADIOLOGY  ED MD interpretation: Chest x-ray read by radiology reviewed by me read as no acute disease.  Official radiology report(s): DG Chest 2 View  Result Date: 01/05/2020 CLINICAL DATA:  79 year old female with chest pain for 3 days, progressed this morning. EXAM: CHEST - 2 VIEW COMPARISON:  02/13/2019 radiographs and earlier. FINDINGS: Long segment previous cervical spine surgery with corpectomy. Visible hardware appears grossly stable. Stable prior sternotomy. Cardiac size and mediastinal contours are within normal  limits. Visualized tracheal air column is within normal limits. Lung volumes are stable with no pneumothorax, pulmonary edema, pleural effusion or confluent pulmonary opacity. Negative visible bowel gas pattern.  Stable cholecystectomy clips. Stable visualized osseous structures. Chronic benign appearing  medullary sclerosis of the proximal right humerus. IMPRESSION: No acute cardiopulmonary abnormality. Electronically Signed   By: Genevie Ann M.D.   On: 01/05/2020 09:27    ____________________________________________   PROCEDURES  Procedure(s) performed (including Critical Care):  Procedures   ____________________________________________   INITIAL IMPRESSION / ASSESSMENT AND PLAN / ED COURSE  Patient with chest pain of unspecified type.  The history sounds like it could be cardiac but all of the tests and evaluations were negative.  Additionally the pain has been going on for several days with again negative results on troponin etc.  She does however have marked decrease in her GFR.  We will get her in the hospital and work on determining the cause of this and seeing if fluids can improve her renal function.             ____________________________________________   FINAL CLINICAL IMPRESSION(S) / ED DIAGNOSES  Final diagnoses:  Chest pain, unspecified type  AKI (acute kidney injury) [facility])     ED Discharge Orders    None       Note:  This document was prepared using Dragon voice recognition software and may include unintentional dictation errors.    Nena Polio, MD 01/05/20 1630

## 2020-01-05 NOTE — H&P (Signed)
[place] [facility] [place] NAME: Rhonda Tyler    MR#:  [id number]  DATE OF BIRTH:  April 06, 1941  DATE OF ADMISSION:  01/05/2020  PRIMARY CARE PHYSICIAN: Kirk Ruths, MD   REQUESTING/REFERRING PHYSICIAN: Dr. Rip Harbour  Patient coming from : home lives with husband  CHIEF COMPLAINT:  chest pain radiating to the left arm on and off for few days chronic headache  HISTORY OF PRESENT ILLNESS:  Rhonda Tyler  is a 79 y.o. 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  ED course: temperature afebrile blood pressure 126/40 pulse 64 troponin times two negative EKG normal sinus rhythm no acute ST elevation her depression  Creatinine 1.90, baseline creatinine .89 (October 2020)  Patient is hemodynamically stable she is being admitted with chest pain rule out and acute kidney failure secondary to dehydration/prerenal PAST MEDICAL HISTORY:   Past Medical History:  Diagnosis Date  . Anginal pain ([place])   . Arthritis   . B12 deficiency anemia   . Cervical disc disease   . Cervical disc disease   . Chest pain   . Cholecystolithiasis   . Colon polyp   . Coronary artery disease   . Cutaneous sarcoidosis   . Diabetes mellitus without complication ([place])    Pt takes insulin  . Diabetic neuropathy ([place])   . GERD (gastroesophageal reflux disease)   . Hemorrhoid   . History of diabetic neuropathy   . Hypercalcemia   . Hyperlipemia   . Hypertension   . IBS (irritable bowel syndrome)   . Iron deficiency anemia   . Myocardial infarction ([place])   . Sleep apnea     PAST SURGICAL HISTOIRY:   Past  Surgical History:  Procedure Laterality Date  . ABDOMINAL HYSTERECTOMY    . arthroscopic rotator cuff repair    . BACK SURGERY    . CARDIAC CATHETERIZATION    . CHOLECYSTECTOMY    . COLONOSCOPY    . COLONOSCOPY WITH PROPOFOL N/A 11/29/2017   Procedure: COLONOSCOPY WITH PROPOFOL;  Surgeon: Lollie Sails, MD;  Location: [facility] ENDOSCOPY;  Service: Endoscopy;  Laterality: N/A;  . CORONARY ANGIOPLASTY     PTCA and stent of RCA  . CORONARY ARTERY BYPASS GRAFT    . ESOPHAGOGASTRODUODENOSCOPY    . ESOPHAGOGASTRODUODENOSCOPY (EGD) WITH PROPOFOL N/A 11/29/2017   Procedure: ESOPHAGOGASTRODUODENOSCOPY (EGD) WITH PROPOFOL;  Surgeon: Lollie Sails, MD;  Location: [facility] ENDOSCOPY;  Service: Endoscopy;  Laterality: N/A;  . ESOPHAGOGASTRODUODENOSCOPY (EGD) WITH PROPOFOL N/A 03/07/2018   Procedure: ESOPHAGOGASTRODUODENOSCOPY (EGD) WITH PROPOFOL;  Surgeon: Lollie Sails, MD;  Location: [facility] ENDOSCOPY;  Service: Endoscopy;  Laterality: N/A;  . ESOPHAGOGASTRODUODENOSCOPY (EGD) WITH PROPOFOL N/A 08/16/2019   Procedure: ESOPHAGOGASTRODUODENOSCOPY (EGD) WITH PROPOFOL;  Surgeon: Toledo, Benay Pike, MD;  Location: [facility] ENDOSCOPY;  Service: Gastroenterology;  Laterality: N/A;  . EXCISION OF ADNEXAL MASS    . JOINT REPLACEMENT     left total knee replacement  . OOPHORECTOMY    . vesicular vaginal fistula      SOCIAL HISTORY:   Social History   Tobacco Use  . Smoking status: Former Smoker    Types: Cigarettes  Quit date: 12/01/1970    Years since quitting: 49.1  . Smokeless tobacco: Never Used  Substance Use Topics  . Alcohol use: Not Currently    FAMILY HISTORY:   Family History  Problem Relation Age of Onset  . Hypertension Mother   . Heart attack Mother   . Hypertension Father   . Heart attack Father   . Skin cancer Father   . Heart attack Sister   . Breast cancer Neg Hx     DRUG ALLERGIES:   Allergies  Allergen Reactions  . Allegra [Fexofenadine]   . Codeine   . Levaquin  [Levofloxacin In D5w]   . Lipitor [Atorvastatin Calcium]   . Lopressor [Metoprolol Tartrate] Other (See Comments)    HEART RACES  . Penicillins   . Potassium-Containing Compounds   . Procardia [Nifedipine]   . Sucralfate   . Sulfa Antibiotics   . Tramadol     REVIEW OF SYSTEMS:  Review of Systems  Constitutional: Negative for chills, fever and weight loss.  HENT: Negative for ear discharge, ear pain and nosebleeds.   Eyes: Negative for blurred vision, pain and discharge.  Respiratory: Negative for sputum production, shortness of breath, wheezing and stridor.   Cardiovascular: Positive for chest pain. Negative for palpitations, orthopnea and PND.  Gastrointestinal: Negative for abdominal pain, diarrhea, nausea and vomiting.  Genitourinary: Negative for frequency and urgency.  Musculoskeletal: Negative for back pain and joint pain.  Neurological: Positive for headaches. Negative for sensory change, speech change, focal weakness and weakness.  Psychiatric/Behavioral: Negative for depression and hallucinations. The patient is nervous/anxious.      MEDICATIONS AT HOME:   Prior to Admission medications   Medication Sig Start Date End Date Taking? Authorizing Provider  acetaminophen (TYLENOL) 500 MG tablet Take 500-1,000 mg by mouth every 4 (four) hours as needed for mild pain or fever.    Yes [provider]  allopurinol (ZYLOPRIM) 100 MG tablet Take 100 mg by mouth daily.   Yes [provider]  aspirin EC 81 MG tablet Take 81 mg by mouth daily.   Yes [provider]  fenofibrate 160 MG tablet Take 160 mg by mouth daily.   Yes [provider]  furosemide (LASIX) 20 MG tablet Take 20 mg by mouth daily.   Yes [provider]  hydroxychloroquine (PLAQUENIL) 200 MG tablet Take 200 mg by mouth daily. 12/29/19  Yes [provider]  insulin NPH-regular Human (NOVOLIN 70/30) (70-30) 100 UNIT/ML injection Inject 30-45 Units into the skin See  admin instructions. Inject up to 45u under the skin every morning and up to 30u under the skin every night   Yes [provider]  insulin regular (NOVOLIN R,HUMULIN R) 100 units/mL injection Inject 4-10 Units into the skin daily before lunch.   Yes [provider]  lisinopril (PRINIVIL,ZESTRIL) 40 MG tablet Take 40 mg by mouth daily.   Yes [provider]  loratadine (CLARITIN) 10 MG tablet Take 10 mg by mouth daily.   Yes [provider]  magnesium oxide (MAG-OX) 400 MG tablet Take 400 mg by mouth 2 (two) times daily.    Yes [provider]  nitroGLYCERIN (NITROSTAT) 0.4 MG SL tablet Place 0.4 mg under the tongue every 5 (five) minutes as needed for chest pain.   Yes [provider]  pantoprazole (PROTONIX) 40 MG tablet Take 40 mg by mouth 2 (two) times daily.   Yes [provider]  potassium chloride SA (K-DUR,KLOR-CON) 20 MEQ tablet Take  20 mEq by mouth daily.   Yes [provider]  propranolol (INDERAL) 40 MG tablet Take 40 mg by mouth 2 (two) times daily.    Yes [provider]  spironolactone (ALDACTONE) 50 MG tablet Take 50 mg by mouth daily.   Yes [provider]  vitamin B-12 (CYANOCOBALAMIN) 1000 MCG tablet Take 2,000 mcg by mouth daily.    Yes [provider]      VITAL SIGNS:  Blood pressure (!) 126/40, pulse 64, temperature 97.8 F (36.6 C), temperature source Oral, resp. rate 18, SpO2 100 %.  PHYSICAL EXAMINATION:  GENERAL:  79 y.o.-year-old patient lying in the bed with no acute distress. Obese EYES: Pupils equal, round, reactive to light and accommodation. No scleral icterus.  HEENT: Head atraumatic, normocephalic. Oropharynx and nasopharynx clear.  NECK:  Supple, no jugular venous distention. No thyroid enlargement, no tenderness.  LUNGS: Normal breath sounds bilaterally, no wheezing, rales,rhonchi or crepitation. No use of accessory muscles of respiration.  CARDIOVASCULAR: S1,  S2 normal. No murmurs, rubs, or gallops.  ABDOMEN: Soft, nontender, nondistended. Bowel sounds present. No organomegaly or mass.  EXTREMITIES: No pedal edema, cyanosis, or clubbing.  NEUROLOGIC: Cranial nerves II through XII are intact. Muscle strength 5/5 in all extremities. Sensation intact. Gait not checked.  PSYCHIATRIC: The patient is alert and oriented x 3.  SKIN: No obvious rash, lesion, or ulcer.   LABORATORY PANEL:   CBC Recent Labs  Lab 01/05/20 0900  WBC 3.4*  HGB 12.0  HCT 35.8*  PLT 157   ------------------------------------------------------------------------------------------------------------------  Chemistries  Recent Labs  Lab 01/05/20 0900  NA 137  K 5.0  CL 101  CO2 26  GLUCOSE 175*  BUN 38*  CREATININE 1.90*  CALCIUM 10.1  AST 28  ALT 17  ALKPHOS 52  BILITOT 0.9   ------------------------------------------------------------------------------------------------------------------  Cardiac Enzymes No results for input(s): TROPONINI in the last 168 hours. ------------------------------------------------------------------------------------------------------------------  RADIOLOGY:  DG Chest 2 View  Result Date: 01/05/2020 CLINICAL DATA:  79 year old female with chest pain for 3 days, progressed this morning. EXAM: CHEST - 2 VIEW COMPARISON:  02/13/2019 radiographs and earlier. FINDINGS: Long segment previous cervical spine surgery with corpectomy. Visible hardware appears grossly stable. Stable prior sternotomy. Cardiac size and mediastinal contours are within normal limits. Visualized tracheal air column is within normal limits. Lung volumes are stable with no pneumothorax, pulmonary edema, pleural effusion or confluent pulmonary opacity. Negative visible bowel gas pattern.  Stable cholecystectomy clips. Stable visualized osseous structures. Chronic benign appearing medullary sclerosis of the proximal right humerus. IMPRESSION: No acute cardiopulmonary  abnormality. Electronically Signed   By: Genevie Ann M.D.   On: 01/05/2020 09:27    EKG:    IMPRESSION AND PLAN:   Rhonda Tyler  is a 79 y.o. 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  Chest pain rule out MI/? Cervical disc disease with radiculopathy history of CAD with CABG in 1993 -admit to medical floor -cycle cardiac enzyme times three -first two sets of cardiac enzymes negative -continue aspirin beta-blockers statins -cardiology consultation with Dr. 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 -continue spironolactone, propranolol -patient has had EGD done in the past -holding Lasix-- resume at discharge if creatinine remains stable  Chronic headaches -patient recommended to follow-up with PCP/headache specialist  Hypertension on propranolol  Hyperlipidemia continue fenofibrate  Morbid obesity  Family Communication : husband Consults : cardiology Dr. Nehemiah Massed Code Status : full code per patient DVT prophylaxis : Lovenox admission status observation  TOTAL TIME TAKING CARE OF THIS PATIENT: *50* minutes.    Fritzi Mandes M.D  [facility]     CC: Primary care physician; Kirk Ruths, MD

## 2020-01-05 NOTE — ED Notes (Signed)
Patient refused her Novolog mix, due to her BS at 1712 being 7, patient is fearful it will make her blood sugar drop over night, patient says her blood sugar drops really low at night

## 2020-01-05 NOTE — ED Notes (Signed)
Pt resting in bed, pure wik placed, husband at bedside, states her stomach is starting to feel a little better.

## 2020-01-06 ENCOUNTER — Other Ambulatory Visit: Payer: Self-pay

## 2020-01-06 ENCOUNTER — Encounter: Payer: Self-pay | Admitting: Internal Medicine

## 2020-01-06 DIAGNOSIS — R079 Chest pain, unspecified: Secondary | ICD-10-CM | POA: Diagnosis not present

## 2020-01-06 DIAGNOSIS — M19012 Primary osteoarthritis, left shoulder: Secondary | ICD-10-CM

## 2020-01-06 DIAGNOSIS — I10 Essential (primary) hypertension: Secondary | ICD-10-CM | POA: Diagnosis not present

## 2020-01-06 DIAGNOSIS — E611 Iron deficiency: Secondary | ICD-10-CM

## 2020-01-06 DIAGNOSIS — N179 Acute kidney failure, unspecified: Secondary | ICD-10-CM | POA: Diagnosis not present

## 2020-01-06 LAB — BASIC METABOLIC PANEL
Anion gap: 8 (ref 5–15)
BUN: 24 mg/dL — ABNORMAL HIGH (ref 8–23)
CO2: 24 mmol/L (ref 22–32)
Calcium: 9.6 mg/dL (ref 8.9–10.3)
Chloride: 103 mmol/L (ref 98–111)
Creatinine, Ser: 1.37 mg/dL — ABNORMAL HIGH (ref 0.44–1.00)
GFR calc Af Amer: 43 mL/min — ABNORMAL LOW (ref >60)
GFR calc non Af Amer: 37 mL/min — ABNORMAL LOW (ref >60)
Glucose, Bld: 157 mg/dL — ABNORMAL HIGH (ref 70–99)
Potassium: 4 mmol/L (ref 3.5–5.1)
Sodium: 135 mmol/L (ref 135–145)

## 2020-01-06 LAB — GLUCOSE, CAPILLARY
Glucose-Capillary: 122 mg/dL — ABNORMAL HIGH (ref 70–99)
Glucose-Capillary: 188 mg/dL — ABNORMAL HIGH (ref 70–99)
Glucose-Capillary: 174 mg/dL — ABNORMAL HIGH (ref 70–99)

## 2020-01-06 MED ORDER — DICLOFENAC SODIUM 1 % EX GEL
2.0000 g | Freq: Four times a day (QID) | CUTANEOUS | Status: DC
  Administered 2020-01-06: 2 g via TOPICAL
  Filled 2020-01-06: qty 100

## 2020-01-06 MED ORDER — ENOXAPARIN SODIUM 30 MG/0.3ML ~~LOC~~ SOLN: 30.0000 mg | SUBCUTANEOUS | Status: DC

## 2020-01-06 MED ORDER — HYDROCODONE-ACETAMINOPHEN 5-325 MG PO TABS
1.0000 | ORAL_TABLET | Freq: Once | ORAL | Status: AC
  Administered 2020-01-06: 1 via ORAL
  Filled 2020-01-06: qty 1

## 2020-01-06 MED ORDER — DICLOFENAC SODIUM 1 % EX GEL
2.0000 g | Freq: Four times a day (QID) | CUTANEOUS | 0 refills | Status: DC
Start: 2020-01-06 — End: 2021-05-05

## 2020-01-06 NOTE — Progress Notes (Signed)
[facility] Encounter Note  Patient: Rhonda Tyler / Admit Date: 01/05/2020 / Date of Encounter: 01/06/2020, 9:27 AM   Subjective: Patient with still having constant left upper chest shoulder and neck discomfort without relief other than using hydrocodone.  No evidence of substernal chest pain and or concerns for true angina.  EKG showing normal sinus rhythm otherwise normal EKG.  Troponin level peaked at 6 with no evidence of acute coronary syndrome.  Patient otherwise has no other cardiovascular symptoms or heart failure  Review of Systems: Positive for: Left upper chest and neck pain Negative for: Vision change, hearing change, syncope, dizziness, nausea, vomiting,diarrhea, bloody stool, stomach pain, cough, congestion, diaphoresis, urinary frequency, urinary pain,skin lesions, skin rashes Others previously listed  Objective: Telemetry: Normal sinus rhythm Physical Exam: Blood pressure (!) 145/62, pulse 66, temperature 97.8 F (36.6 C), temperature source Oral, resp. rate 18, height 5\' 3"  (1.6 m), weight 92.5 kg, SpO2 100 %. Body mass index is 36.12 kg/m. General: Well developed, well nourished, in no acute distress. Head: Normocephalic, atraumatic, sclera non-icteric, no xanthomas, nares are without discharge. Neck: No apparent masses Lungs: Normal respirations with no wheezes, no rhonchi, no rales , no crackles   Heart: Regular rate and rhythm, normal S1 S2, no murmur, no rub, no gallop, PMI is normal size and placement, carotid upstroke normal without bruit, jugular venous pressure normal Abdomen: Soft, non-tender, non-distended with normoactive bowel sounds. No hepatosplenomegaly. Abdominal aorta is normal size without bruit Extremities: No edema, no clubbing, no cyanosis, no ulcers,  Peripheral: 2+ radial, 2+ femoral, 2+ dorsal pedal pulses Neuro: Alert and oriented. Moves all extremities spontaneously. Psych:  Responds to questions appropriately with a normal  affect.   Intake/Output Summary (Last 24 hours) at 01/06/2020 5364 Last data filed at 01/06/2020 0316 Gross per 24 hour  Intake --  Output 1 ml  Net -1 ml    Inpatient Medications:  . allopurinol  100 mg Oral Daily  . aspirin EC  81 mg Oral Daily  . enoxaparin (LOVENOX) injection  30 mg Subcutaneous Q24H  . fenofibrate  160 mg Oral Daily  . hydroxychloroquine  200 mg Oral Daily  . insulin aspart  0-5 Units Subcutaneous QHS  . insulin aspart  0-9 Units Subcutaneous TID WC  . insulin aspart protamine- aspart  30 Units Subcutaneous BID WC  . loratadine  10 mg Oral Daily  . magnesium oxide  400 mg Oral BID  . pantoprazole  40 mg Oral BID  . propranolol  40 mg Oral BID  . vitamin B-12  2,000 mcg Oral Daily   Infusions:   Labs: Recent Labs    01/05/20 0900  NA 137  K 5.0  CL 101  CO2 26  GLUCOSE 175*  BUN 38*  CREATININE 1.90*  CALCIUM 10.1   Recent Labs    01/05/20 0900  AST 28  ALT 17  ALKPHOS 52  BILITOT 0.9  PROT 7.5  ALBUMIN 3.7   Recent Labs    01/05/20 0900  WBC 3.4*  HGB 12.0  HCT 35.8*  MCV 82.5  PLT 157   No results for input(s): CKTOTAL, CKMB, TROPONINI in the last 72 hours. Invalid input(s): POCBNP Recent Labs    01/05/20 1255  HGBA1C 5.8*     Weights: Filed Weights   01/06/20 0235 01/06/20 0300  Weight: 85 kg 92.5 kg     Radiology/Studies:  DG Chest 2 View  Result Date: 01/05/2020 CLINICAL DATA:  79 year old female with chest  pain for 3 days, progressed this morning. EXAM: CHEST - 2 VIEW COMPARISON:  02/13/2019 radiographs and earlier. FINDINGS: Long segment previous cervical spine surgery with corpectomy. Visible hardware appears grossly stable. Stable prior sternotomy. Cardiac size and mediastinal contours are within normal limits. Visualized tracheal air column is within normal limits. Lung volumes are stable with no pneumothorax, pulmonary edema, pleural effusion or confluent pulmonary opacity. Negative visible bowel gas pattern.   Stable cholecystectomy clips. Stable visualized osseous structures. Chronic benign appearing medullary sclerosis of the proximal right humerus. IMPRESSION: No acute cardiopulmonary abnormality. Electronically Signed   By: Genevie Ann M.D.   On: 01/05/2020 09:27     Assessment and Recommendation  79 y.o. female with known coronary artery disease status post coronary bypass graft diabetes hypertension hyperlipidemia chronic kidney disease with acute left upper chest discomfort neck discomfort constant in nature waxing and waning some in the last few days without evidence of myocardial infarction acute coronary syndrome or congestive heart failure 1.  Continue medication management for further risk reduction of cardiovascular event including antiplatelet medication management propranolol and fenofibric 2.  No further cardiac diagnostics necessary at this time without evidence of congestive heart failure or acute coronary syndrome 3.  Continue supportive therapy for left upper chest and neck discomfort which appears noncardiac in nature at this time 4.  Begin ambulation and follow-up for improvements of symptoms and need for further diagnostic testing but if ambulating well with current medical regimen okay for discharged home from cardiac standpoint with follow-up in Dr. Ubaldo Glassing in 1 week for further adjustments in medication management 5.  Call if further questions otherwise will assume the patient is ambulated well with no significant issues and has been discharged home later today  Signed, Serafina Royals M.D. FACC

## 2020-01-06 NOTE — Progress Notes (Signed)
Patient discharging home, transporting home via taxi voucher. IV removed. Discharge instructions given to patient, verbalized understanding.

## 2020-01-06 NOTE — Discharge Summary (Addendum)
Physician Discharge Summary  Rhonda Tyler:[id number] DOB: 01-Apr-1941 DOA: 01/05/2020  PCP: Kirk Ruths, MD  Admit date: 01/05/2020 Discharge date: 01/06/2020  Admitted From: Home  Disposition:  Home   Recommendations for Outpatient Follow-up and new medication changes:  1. Follow up with Dr Ouida Sills in 7 days.  2. Patient has been placed on topical diclofenac for left shoulder arthritis.  3. Discontinue spironolactone and potassium supplements due to high normal potassium and risk of hyperkalemia. 4. Follow up on renal function in 7 days.   Home Health: no   Equipment/Devices: no    Discharge Condition: stable  CODE STATUS:  full Diet recommendation: heart healthy   Brief/Interim Summary: Patient admitted to the hospital with the working diagnosis of chest pain, which ruled out acute coronary syndrome.  79 year old female with significant past medical history for coronary artery disease status post bypass grafting. 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 She also has type 2 diabetes mellitus, cervical disc disease, chronic kidney disease III b, and iron deficiency anemia.  Patient reported 4 days of intermittent precordial chest pain, last night it lasted for several hours. The pain was sharp in nature, radiating to left arm. On her initial physical examination blood pressure 126/40, heart rate 64, temperature 97.8, respiratory rate 18, oxygen saturation 100%. Lungs were clear to auscultation bilaterally, heart S1-S2, present rhythmic, abdomen was soft and nontender, no lower extremity edema. Sodium 137, potassium 5.0, chloride 101, bicarb 26, glucose 175, BUN 38, creatinine 1.90, troponin I 4-6-7-9, white count 3.4, hemoglobin 12.0, hematocrit 35.8, platelets 157. SARS COVID-19 was negative. Chest radiograph with no infiltrates.  EKG 65 bpm, normal axis, normal intervals, sinus rhythm, poor R wave progression, no ST segment or T wave  changes, low voltage.  1.  Atypical chest pain, rule out for acute coronary syndrome.  Patient was admitted to the medical ward, she was placed on a remote telemetry monitor. She ruled out for acute coronary syndrome.  2.  Left shoulder arthritis.  She does have reproducible pain upon passive movement of the left shoulder and left shoulder joint tenderness to palpation. Patient will be placed on topical diclofenac, and follow-up as an outpatient.  3.  Acute kidney injury.  Patient received intravenous fluids with isotonic saline with good toleration.  Diuretic therapy was held. Patient will have a follow-up renal panel before discharge.  4.  Coronary disease/dyslipidemia.  Patient status post bypass grafting, she has ruled out for acute coronary syndrome, continue aspirin and fenofibrate.  5.  Type 2 diabetes mellitus. Controlled with hemoglobin A1c of 5.8. Continue insulin therapy at home, 7-30.  6. Iron deficiency anemia. Hgb and Hct have been stable, follow up as outpatient.   7. Arthritis. Continue hydroxychloroquine.  8. Cirrhosis. Resume furosemide 20 mg, will hold spironolactone and potassium supplementations due to high normal potassium. Recommend follow-up kidney function as an outpatient within 7 days. Continue propanolol.  9. Hypertension. Continue blood pressure control with lisinopril, patient is on high dose of 40 mg daily.  Discharge Diagnoses:  Principal Problem:   Chest pain Active Problems:   Iron deficiency anemia   AKI (acute kidney injury) ([place])   Essential hypertension   Other cirrhosis of liver (HCC)   Arthritis of left shoulder region   Iron deficiency    Discharge Instructions   Allergies as of 01/06/2020      Reactions   Allegra [fexofenadine]    Codeine Other (See Comments)   Hallucinate  Levaquin [levofloxacin In D5w]    Lipitor [atorvastatin Calcium]    Lopressor [metoprolol Tartrate] Other (See Comments)   HEART RACES   Penicillins     Potassium-containing Compounds    Procardia [nifedipine]    Sucralfate    Sulfa Antibiotics    Tramadol       Medication List    STOP taking these medications   potassium chloride SA 20 MEQ tablet Commonly known as: KLOR-CON   spironolactone 50 MG tablet Commonly known as: ALDACTONE     TAKE these medications   acetaminophen 500 MG tablet Commonly known as: TYLENOL Take 500-1,000 mg by mouth every 4 (four) hours as needed for mild pain or fever.   allopurinol 100 MG tablet Commonly known as: ZYLOPRIM Take 100 mg by mouth daily.   aspirin EC 81 MG tablet Take 81 mg by mouth daily.   diclofenac Sodium 1 % Gel Commonly known as: VOLTAREN Apply 2 g topically 4 (four) times daily. Apply to left shoulder.   fenofibrate 160 MG tablet Take 160 mg by mouth daily.   furosemide 20 MG tablet Commonly known as: LASIX Take 20 mg by mouth daily.   hydroxychloroquine 200 MG tablet Commonly known as: PLAQUENIL Take 200 mg by mouth daily.   insulin NPH-regular Human (70-30) 100 UNIT/ML injection Inject 30-45 Units into the skin See admin instructions. Inject up to 45u under the skin every morning and up to 30u under the skin every night   insulin regular 100 units/mL injection Commonly known as: NOVOLIN R Inject 4-10 Units into the skin daily before lunch.   lisinopril 40 MG tablet Commonly known as: ZESTRIL Take 40 mg by mouth daily.   loratadine 10 MG tablet Commonly known as: CLARITIN Take 10 mg by mouth daily.   magnesium oxide 400 MG tablet Commonly known as: MAG-OX Take 400 mg by mouth 2 (two) times daily.   nitroGLYCERIN 0.4 MG SL tablet Commonly known as: NITROSTAT Place 0.4 mg under the tongue every 5 (five) minutes as needed for chest pain.   pantoprazole 40 MG tablet Commonly known as: PROTONIX Take 40 mg by mouth 2 (two) times daily.   propranolol 40 MG tablet Commonly known as: INDERAL Take 40 mg by mouth 2 (two) times daily.   vitamin B-12 1000  MCG tablet Commonly known as: CYANOCOBALAMIN Take 2,000 mcg by mouth daily.       Allergies  Allergen Reactions  . Allegra [Fexofenadine]   . Codeine Other (See Comments)    Hallucinate  . Levaquin [Levofloxacin In D5w]   . Lipitor [Atorvastatin Calcium]   . Lopressor [Metoprolol Tartrate] Other (See Comments)    HEART RACES  . Penicillins   . Potassium-Containing Compounds   . Procardia [Nifedipine]   . Sucralfate   . Sulfa Antibiotics   . Tramadol     Consultations:  Cardiology    Procedures/Studies: DG Chest 2 View  Result Date: 01/05/2020 CLINICAL DATA:  79 year old female with chest pain for 3 days, progressed this morning. EXAM: CHEST - 2 VIEW COMPARISON:  02/13/2019 radiographs and earlier. FINDINGS: Long segment previous cervical spine surgery with corpectomy. Visible hardware appears grossly stable. Stable prior sternotomy. Cardiac size and mediastinal contours are within normal limits. Visualized tracheal air column is within normal limits. Lung volumes are stable with no pneumothorax, pulmonary edema, pleural effusion or confluent pulmonary opacity. Negative visible bowel gas pattern.  Stable cholecystectomy clips. Stable visualized osseous structures. Chronic benign appearing medullary sclerosis of the proximal right humerus.  IMPRESSION: No acute cardiopulmonary abnormality. Electronically Signed   By: Genevie Ann M.D.   On: 01/05/2020 09:27      Subjective: Patient with left shoulder pain, no nausea or vomiting no dyspnea.   Discharge Exam: Vitals:   01/06/20 0642 01/06/20 1059  BP: (!) 145/62 (!) 127/48  Pulse: 66 (!) 57  Resp: 18 14  Temp: 97.8 F (36.6 C) 97.7 F (36.5 C)  SpO2: 100% 100%   Vitals:   01/06/20 0235 01/06/20 0300 01/06/20 0642 01/06/20 1059  BP: 140/73  (!) 145/62 (!) 127/48  Pulse: 65  66 (!) 57  Resp: 19  18 14   Temp: 97.8 F (36.6 C)  97.8 F (36.6 C) 97.7 F (36.5 C)  TempSrc: Oral  Oral Oral  SpO2: 100%  100% 100%  Weight:  85 kg 92.5 kg    Height: 5\' 3"  (1.6 m)       General: Not in pain or dyspnea  Neurology: Awake and alert, non focal  E ENT: mild pallor, no icterus, oral mucosa moist Cardiovascular: No JVD. S1-S2 present, rhythmic, no gallops, rubs, or murmurs. No lower extremity edema. Pulmonary: positive breath sounds bilaterally, adequate air movement, no wheezing, rhonchi or rales. Gastrointestinal. Abdomen soft and non tender Skin. No rashes Musculoskeletal: no joint deformities   The results of significant diagnostics from this hospitalization (including imaging, microbiology, ancillary and laboratory) are listed below for reference.     Microbiology: Recent Results (from the past 240 hour(s))  SARS Coronavirus 2 by RT PCR (hospital order, performed in [facility] hospital lab) Nasopharyngeal Nasopharyngeal Swab     Status: None   Collection Time: 01/05/20 10:39 AM   Specimen: Nasopharyngeal Swab  Result Value Ref Range Status   SARS Coronavirus 2 NEGATIVE NEGATIVE Final    Comment: (NOTE) SARS-CoV-2 target nucleic acids are NOT DETECTED.  The SARS-CoV-2 RNA is generally detectable in upper and lower respiratory specimens during the acute phase of infection. The lowest concentration of SARS-CoV-2 viral copies this assay can detect is 250 copies / mL. A negative result does not preclude SARS-CoV-2 infection and should not be used as the sole basis for treatment or other patient management decisions.  A negative result may occur with improper specimen collection / handling, submission of specimen other than nasopharyngeal swab, presence of viral mutation(s) within the areas targeted by this assay, and inadequate number of viral copies (<250 copies / mL). A negative result must be combined with clinical observations, patient history, and epidemiological information.  Fact Sheet for Patients:   StrictlyIdeas.no  Fact Sheet for Healthcare  Providers: BankingDealers.co.za  This test is not yet approved or  cleared by the [place] FDA and has been authorized for detection and/or diagnosis of SARS-CoV-2 by FDA under an Emergency Use Authorization (EUA).  This EUA will remain in effect (meaning this test can be used) for the duration of the COVID-19 declaration under Section 564(b)(1) of the Act, 21 U.S.C. section 360bbb-3(b)(1), unless the authorization is terminated or revoked sooner.  Performed at [facility], [address] [place], [place] [address]      Labs: BNP (last 3 results) Recent Labs    01/05/20 0900  BNP 10.1   Basic Metabolic Panel: Recent Labs  Lab 01/05/20 0900  NA 137  K 5.0  CL 101  CO2 26  GLUCOSE 175*  BUN 38*  CREATININE 1.90*  CALCIUM 10.1   Liver Function Tests: Recent Labs  Lab 01/05/20 0900  AST 28  ALT 17  ALKPHOS 52  BILITOT 0.9  PROT 7.5  ALBUMIN 3.7   No results for input(s): LIPASE, AMYLASE in the last 168 hours. No results for input(s): AMMONIA in the last 168 hours. CBC: Recent Labs  Lab 01/05/20 0900  WBC 3.4*  HGB 12.0  HCT 35.8*  MCV 82.5  PLT 157   Cardiac Enzymes: No results for input(s): CKTOTAL, CKMB, CKMBINDEX, TROPONINI in the last 168 hours. BNP: Invalid input(s): POCBNP CBG: Recent Labs  Lab 01/05/20 1711 01/05/20 2211 01/06/20 0849 01/06/20 1222  GLUCAP 81 152* 122* 188*   D-Dimer No results for input(s): DDIMER in the last 72 hours. Hgb A1c Recent Labs    01/05/20 1255  HGBA1C 5.8*   Lipid Profile No results for input(s): CHOL, HDL, LDLCALC, TRIG, CHOLHDL, LDLDIRECT in the last 72 hours. Thyroid function studies No results for input(s): TSH, T4TOTAL, T3FREE, THYROIDAB in the last 72 hours.  Invalid input(s): FREET3 Anemia work up No results for input(s): VITAMINB12, FOLATE, FERRITIN, TIBC, IRON, RETICCTPCT in the last 72 hours. Urinalysis    Component Value Date/Time   COLORURINE  STRAW (A) 06/30/2018 1136   APPEARANCEUR CLEAR (A) 06/30/2018 1136   LABSPEC 1.005 06/30/2018 1136   PHURINE 5.0 06/30/2018 1136   GLUCOSEU NEGATIVE 06/30/2018 1136   HGBUR NEGATIVE 06/30/2018 1136   BILIRUBINUR NEGATIVE 06/30/2018 1136   KETONESUR NEGATIVE 06/30/2018 1136   PROTEINUR NEGATIVE 06/30/2018 1136   NITRITE NEGATIVE 06/30/2018 1136   LEUKOCYTESUR NEGATIVE 06/30/2018 1136   Sepsis Labs Invalid input(s): PROCALCITONIN,  WBC,  LACTICIDVEN Microbiology Recent Results (from the past 240 hour(s))  SARS Coronavirus 2 by RT PCR (hospital order, performed in [place] [facility] hospital lab) Nasopharyngeal Nasopharyngeal Swab     Status: None   Collection Time: 01/05/20 10:39 AM   Specimen: Nasopharyngeal Swab  Result Value Ref Range Status   SARS Coronavirus 2 NEGATIVE NEGATIVE Final    Comment: (NOTE) SARS-CoV-2 target nucleic acids are NOT DETECTED.  The SARS-CoV-2 RNA is generally detectable in upper and lower respiratory specimens during the acute phase of infection. The lowest concentration of SARS-CoV-2 viral copies this assay can detect is 250 copies / mL. A negative result does not preclude SARS-CoV-2 infection and should not be used as the sole basis for treatment or other patient management decisions.  A negative result may occur with improper specimen collection / handling, submission of specimen other than nasopharyngeal swab, presence of viral mutation(s) within the areas targeted by this assay, and inadequate number of viral copies (<250 copies / mL). A negative result must be combined with clinical observations, patient history, and epidemiological information.  Fact Sheet for Patients:   StrictlyIdeas.no  Fact Sheet for Healthcare Providers: BankingDealers.co.za  This test is not yet approved or  cleared by the [place] FDA and has been authorized for detection and/or diagnosis of SARS-CoV-2 by FDA under an  Emergency Use Authorization (EUA).  This EUA will remain in effect (meaning this test can be used) for the duration of the COVID-19 declaration under Section 564(b)(1) of the Act, 21 U.S.C. section 360bbb-3(b)(1), unless the authorization is terminated or revoked sooner.  Performed at [facility], [address] [place], [place] [address]      Time coordinating discharge: 45 minutes  SIGNED:   Tawni Millers, MD  [facility] 01/06/2020, 3:22 PM

## 2020-01-06 NOTE — Plan of Care (Signed)
  Problem: Education: Goal: Knowledge of General Education information will improve Description: Including pain rating scale, medication(s)/side effects and non-pharmacologic comfort measures Outcome: Adequate for Discharge   Problem: Clinical Measurements: Goal: Will remain free from infection Outcome: Adequate for Discharge   Problem: Activity: Goal: Risk for activity intolerance will decrease Outcome: Adequate for Discharge   Problem: Elimination: Goal: Will not experience complications related to urinary retention Outcome: Adequate for Discharge   Problem: Pain Managment: Goal: General experience of comfort will improve Outcome: Adequate for Discharge   Problem: Safety: Goal: Ability to remain free from injury will improve Outcome: Adequate for Discharge   Problem: Skin Integrity: Goal: Risk for impaired skin integrity will decrease Outcome: Adequate for Discharge

## 2020-01-06 NOTE — ED Notes (Signed)
Request put in for transport to floor.

## 2020-01-06 NOTE — TOC Transition Note (Signed)
Transition of Care [facility]) - CM/SW Discharge Note   Patient Details  Name: Rhonda Tyler MRN: [id number] Date of Birth: 10-30-1940  Transition of Care [facility]) CM/SW Contact:  Harriet Masson, RN Phone Number: 01/06/2020, 4:48 PM   Clinical Narrative:    Notified pt will discharge home and need a taxi voucher for transportation. RN spoke with pt and verified home address and no family member available for transport. RN provided a taxi vouch and verify designated address. Voucher provided to the floor nurse.   Final next level of care: Home/Self Care Barriers to Discharge: No Barriers Identified   Patient Goals and CMS Choice        Discharge Placement                       Discharge Plan and Services                                     Social Determinants of Health (SDOH) Interventions     Readmission Risk Interventions No flowsheet data found.

## 2020-01-22 DIAGNOSIS — K7469 Other cirrhosis of liver: Secondary | ICD-10-CM | POA: Diagnosis present

## 2020-01-22 DIAGNOSIS — K7581 Nonalcoholic steatohepatitis (NASH): Secondary | ICD-10-CM | POA: Diagnosis present

## 2020-01-22 DIAGNOSIS — K7460 Unspecified cirrhosis of liver: Secondary | ICD-10-CM | POA: Diagnosis present

## 2020-01-31 ENCOUNTER — Other Ambulatory Visit: Payer: Self-pay | Admitting: Internal Medicine

## 2020-01-31 DIAGNOSIS — R519 Headache, unspecified: Secondary | ICD-10-CM

## 2020-02-01 ENCOUNTER — Other Ambulatory Visit: Payer: Self-pay | Admitting: Internal Medicine

## 2020-02-01 DIAGNOSIS — Z1231 Encounter for screening mammogram for malignant neoplasm of breast: Secondary | ICD-10-CM

## 2020-02-05 ENCOUNTER — Other Ambulatory Visit: Payer: Medicare Other

## 2020-02-09 ENCOUNTER — Other Ambulatory Visit: Payer: Self-pay

## 2020-02-09 ENCOUNTER — Inpatient Hospital Stay: Payer: Medicare Other | Attending: Oncology

## 2020-02-09 DIAGNOSIS — D509 Iron deficiency anemia, unspecified: Secondary | ICD-10-CM | POA: Insufficient documentation

## 2020-02-09 DIAGNOSIS — R110 Nausea: Secondary | ICD-10-CM | POA: Diagnosis not present

## 2020-02-09 DIAGNOSIS — Z87891 Personal history of nicotine dependence: Secondary | ICD-10-CM | POA: Diagnosis not present

## 2020-02-09 LAB — CBC WITH DIFFERENTIAL/PLATELET
Abs Immature Granulocytes: 0.05 10*3/uL (ref 0.00–0.07)
Basophils Absolute: 0 10*3/uL (ref 0.0–0.1)
Basophils Relative: 1 %
Eosinophils Absolute: 0.2 10*3/uL (ref 0.0–0.5)
Eosinophils Relative: 4 %
HCT: 34.8 % — ABNORMAL LOW (ref 36.0–46.0)
Hemoglobin: 11.8 g/dL — ABNORMAL LOW (ref 12.0–15.0)
Immature Granulocytes: 1 %
Lymphocytes Relative: 23 %
Lymphs Abs: 1.2 10*3/uL (ref 0.7–4.0)
MCH: 29.2 pg (ref 26.0–34.0)
MCHC: 33.9 g/dL (ref 30.0–36.0)
MCV: 86.1 fL (ref 80.0–100.0)
Monocytes Absolute: 0.5 10*3/uL (ref 0.1–1.0)
Monocytes Relative: 10 %
Neutro Abs: 3.3 10*3/uL (ref 1.7–7.7)
Neutrophils Relative %: 61 %
Platelets: 149 10*3/uL — ABNORMAL LOW (ref 150–400)
RBC: 4.04 MIL/uL (ref 3.87–5.11)
RDW: 16 % — ABNORMAL HIGH (ref 11.5–15.5)
WBC: 5.3 10*3/uL (ref 4.0–10.5)
nRBC: 0 % (ref 0.0–0.2)

## 2020-02-09 LAB — IRON AND TIBC
Iron: 73 ug/dL (ref 28–170)
Saturation Ratios: 17 % (ref 10.4–31.8)
TIBC: 430 ug/dL (ref 250–450)
UIBC: 357 ug/dL

## 2020-02-09 LAB — FERRITIN: Ferritin: 78 ng/mL (ref 11–307)

## 2020-02-12 ENCOUNTER — Inpatient Hospital Stay (HOSPITAL_BASED_OUTPATIENT_CLINIC_OR_DEPARTMENT_OTHER): Payer: Medicare Other | Admitting: Oncology

## 2020-02-12 ENCOUNTER — Encounter: Payer: Self-pay | Admitting: Oncology

## 2020-02-12 ENCOUNTER — Other Ambulatory Visit: Payer: Self-pay

## 2020-02-12 VITALS — BP 118/70 | HR 62 | Temp 97.7°F | Resp 16 | Wt 181.7 lb

## 2020-02-12 DIAGNOSIS — D509 Iron deficiency anemia, unspecified: Secondary | ICD-10-CM | POA: Diagnosis not present

## 2020-02-12 DIAGNOSIS — R110 Nausea: Secondary | ICD-10-CM | POA: Diagnosis not present

## 2020-02-12 NOTE — Progress Notes (Signed)
Hematology/Oncology  Follow up  note [facility] [phone] Fax:([phone]) [phone]   Patient Care Team: Kirk Ruths, MD as PCP - General (Internal Medicine)  REFERRING PROVIDER: Kirk Ruths, MD  CHIEF COMPLAINTS/REASON FOR VISIT:  Evaluation of iron deficiency anemia  HISTORY OF PRESENTING ILLNESS:  Rhonda Tyler is a  79 y.o.  female with PMH listed below who was referred to me for evaluation of iron deficiency anemia Reviewed patient's recent labs that was done at [facility] office. 05/02/2018 Labs revealed anemia with hemoglobin of 9.3, MCV 74.7, wbc 6.1, platelet 343,000 Iron panel showed TIBC 602, iron saturation 9, ferritin 14.   Reviewed patient's previous labs ordered by primary care physician's office, anemia is chronic onset , duration is since April 2019 No aggravating or improving factors.  Associated signs and symptoms: Patient reports fatigue. Mild  SOB with exertion.  Denies weight loss, easy bruising, hematochezia, hemoptysis, hematuria. Context:  History of iron deficiency:  Rectal bleeding: deneis Hematemesis or hemoptysis : denies Blood in urine : denies  Last endoscopy: 03/08/2019 upper endoscopy showed many gastric polyps. 11/29/2017 The entire examined colon is normal. - No specimens collected  11/29/2017 Upper endosocpy, Multiple gastric polyps. The largest with stigmata of bleeding resected and retrieved. Clip (MR conditional) was placed. Pathology showed hyperplastic gastric polyps.  Fatigue: Yes.   She is on Aspirin 81mg  daily,   INTERVAL HISTORY Rhonda Tyler is a 79 y.o. female who has above history reviewed by me today presents for follow up visit for management of iron deficiency anemia Problems and complaints are listed below: Patient reports feeling well. She is currently not taking oral iron supplementation. Today she feels nausea, left abdomen discomfort.  Some bloating feeling in the morning and the  symptom has improved.  She is now on fluid pills for chronic liver disease. No vomiting, fever, chills, constipation, dysuria    Review of Systems  Constitutional: Positive for fatigue. Negative for appetite change, chills and fever.  HENT:   Negative for hearing loss and voice change.   Eyes: Negative for eye problems.  Respiratory: Negative for chest tightness and cough.   Cardiovascular: Negative for chest pain.  Gastrointestinal: Positive for nausea. Negative for abdominal distention, abdominal pain and blood in stool.  Endocrine: Negative for hot flashes.  Genitourinary: Negative for difficulty urinating and frequency.   Musculoskeletal: Negative for arthralgias.  Skin: Negative for itching and rash.  Neurological: Negative for extremity weakness.  Hematological: Negative for adenopathy.  Psychiatric/Behavioral: Negative for confusion.    MEDICAL HISTORY:  Past Medical History:  Diagnosis Date  . Anginal pain ([place])   . Arthritis   . B12 deficiency anemia   . Cervical disc disease   . Cervical disc disease   . Chest pain   . Cholecystolithiasis   . 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 Colon polyp   . Coronary artery disease   . Cutaneous sarcoidosis   . Diabetes mellitus without complication ([place])    Pt takes insulin  . Diabetic neuropathy ([place])   . GERD (gastroesophageal reflux disease)   . Hemorrhoid   . History of diabetic neuropathy   . Hypercalcemia   . Hyperlipemia   . Hypertension   . IBS (irritable bowel syndrome)   . Iron deficiency anemia   . Myocardial infarction ([place])   . Sleep apnea     SURGICAL HISTORY: Past Surgical History:  Procedure Laterality Date  . ABDOMINAL HYSTERECTOMY    . arthroscopic  rotator cuff repair    . BACK SURGERY    . CARDIAC CATHETERIZATION    . CHOLECYSTECTOMY    . COLONOSCOPY    . COLONOSCOPY WITH PROPOFOL N/A 11/29/2017   Procedure: COLONOSCOPY WITH PROPOFOL;  Surgeon: Lollie Sails, MD;  Location: [facility] ENDOSCOPY;   Service: Endoscopy;  Laterality: N/A;  . CORONARY ANGIOPLASTY     PTCA and stent of RCA  . CORONARY ARTERY BYPASS GRAFT    . ESOPHAGOGASTRODUODENOSCOPY    . ESOPHAGOGASTRODUODENOSCOPY (EGD) WITH PROPOFOL N/A 11/29/2017   Procedure: ESOPHAGOGASTRODUODENOSCOPY (EGD) WITH PROPOFOL;  Surgeon: Lollie Sails, MD;  Location: [facility] ENDOSCOPY;  Service: Endoscopy;  Laterality: N/A;  . ESOPHAGOGASTRODUODENOSCOPY (EGD) WITH PROPOFOL N/A 03/07/2018   Procedure: ESOPHAGOGASTRODUODENOSCOPY (EGD) WITH PROPOFOL;  Surgeon: Lollie Sails, MD;  Location: [facility] ENDOSCOPY;  Service: Endoscopy;  Laterality: N/A;  . ESOPHAGOGASTRODUODENOSCOPY (EGD) WITH PROPOFOL N/A 08/16/2019   Procedure: ESOPHAGOGASTRODUODENOSCOPY (EGD) WITH PROPOFOL;  Surgeon: Toledo, Benay Pike, MD;  Location: [facility] ENDOSCOPY;  Service: Gastroenterology;  Laterality: N/A;  . EXCISION OF ADNEXAL MASS    . JOINT REPLACEMENT     left total knee replacement  . OOPHORECTOMY    . vesicular vaginal fistula      SOCIAL HISTORY: Social History   Socioeconomic History  . Marital status: Married    Spouse name: Not on file  . Number of children: Not on file  . Years of education: Not on file  . Highest education level: Not on file  Occupational History  . Occupation: retired  Tobacco Use  . Smoking status: Former Smoker    Types: Cigarettes    Quit date: 12/01/1970    Years since quitting: 49.2  . Smokeless tobacco: Never Used  Vaping Use  . Vaping Use: Never used  Substance and Sexual Activity  . Alcohol use: Not Currently  . Drug use: Never  . Sexual activity: Not on file  Other Topics Concern  . Not on file  Social History Narrative  . Not on file   Social Determinants of Health   Financial Resource Strain:   . Difficulty of Paying Living Expenses: Not on file  Food Insecurity:   . Worried About Charity fundraiser in the Last Year: Not on file  . Ran Out of Food in the Last Year: Not on file  Transportation Needs:   .  Lack of Transportation (Medical): Not on file  . Lack of Transportation (Non-Medical): Not on file  Physical Activity:   . Days of Exercise per Week: Not on file  . Minutes of Exercise per Session: Not on file  Stress:   . Feeling of Stress : Not on file  Social Connections:   . Frequency of Communication with Friends and Family: Not on file  . Frequency of Social Gatherings with Friends and Family: Not on file  . Attends Religious Services: Not on file  . Active Member of Clubs or Organizations: Not on file  . Attends Archivist Meetings: Not on file  . Marital Status: Not on file  Intimate Partner Violence:   . Fear of Current or Ex-Partner: Not on file  . Emotionally Abused: Not on file  . Physically Abused: Not on file  . Sexually Abused: Not on file    FAMILY HISTORY: Family History  Problem Relation Age of Onset  . Hypertension Mother   . Heart attack Mother   . Hypertension Father   . Heart attack Father   . Skin cancer Father   .  Heart attack Sister   . Breast cancer Neg Hx     ALLERGIES:  is allergic to allegra [fexofenadine], codeine, levaquin [levofloxacin in d5w], lipitor [atorvastatin calcium], lopressor [metoprolol tartrate], penicillins, potassium-containing compounds, procardia [nifedipine], sucralfate, sulfa antibiotics, and tramadol.  MEDICATIONS:  Current Outpatient Medications  Medication Sig Dispense Refill  . acetaminophen (TYLENOL) 500 MG tablet Take 500-1,000 mg by mouth every 4 (four) hours as needed for mild pain or fever.     Marland Kitchen allopurinol (ZYLOPRIM) 100 MG tablet Take 100 mg by mouth daily.    Marland Kitchen aspirin EC 81 MG tablet Take 81 mg by mouth daily.    . diclofenac Sodium (VOLTAREN) 1 % GEL Apply 2 g topically 4 (four) times daily. Apply to left shoulder. 50 g 0  . fenofibrate 160 MG tablet Take 160 mg by mouth daily.    . furosemide (LASIX) 20 MG tablet Take 20 mg by mouth daily.    . hydroxychloroquine (PLAQUENIL) 200 MG tablet Take 200  mg by mouth daily.    . insulin NPH-regular Human (NOVOLIN 70/30) (70-30) 100 UNIT/ML injection Inject 30-45 Units into the skin See admin instructions. Inject up to 45u under the skin every morning and up to 30u under the skin every night    . insulin regular (NOVOLIN R,HUMULIN R) 100 units/mL injection Inject 4-10 Units into the skin daily before lunch.    . lisinopril (PRINIVIL,ZESTRIL) 40 MG tablet Take 40 mg by mouth daily.    Marland Kitchen loratadine (CLARITIN) 10 MG tablet Take 10 mg by mouth daily.    . magnesium oxide (MAG-OX) 400 MG tablet Take 400 mg by mouth 2 (two) times daily.     . meclizine (ANTIVERT) 25 MG tablet Take 25 mg by mouth every 4 (four) hours as needed.    . nitroGLYCERIN (NITROSTAT) 0.4 MG SL tablet Place 0.4 mg under the tongue every 5 (five) minutes as needed for chest pain.    . pantoprazole (PROTONIX) 40 MG tablet Take 40 mg by mouth 2 (two) times daily.    . potassium chloride SA (KLOR-CON) 20 MEQ tablet Take 20 mEq by mouth daily.    . propranolol (INDERAL) 40 MG tablet Take 40 mg by mouth 2 (two) times daily.     Marland Kitchen spironolactone (ALDACTONE) 50 MG tablet Take by mouth.    . vitamin B-12 (CYANOCOBALAMIN) 1000 MCG tablet Take 2,000 mcg by mouth daily.      No current facility-administered medications for this visit.     PHYSICAL EXAMINATION: ECOG PERFORMANCE STATUS: 1 - Symptomatic but completely ambulatory Vitals:   02/12/20 1307  BP: 118/70  Pulse: 62  Resp: 16  Temp: 97.7 F (36.5 C)   Filed Weights   02/12/20 1307  Weight: 181 lb 11.2 oz (82.4 kg)    Physical Exam Constitutional:      General: She is not in acute distress. HENT:     Head: Normocephalic and atraumatic.  Eyes:     General: No scleral icterus.    Pupils: Pupils are equal, round, and reactive to light.  Cardiovascular:     Rate and Rhythm: Normal rate and regular rhythm.     Heart sounds: Normal heart sounds.  Pulmonary:     Effort: Pulmonary effort is normal. No respiratory  distress.     Breath sounds: No wheezing.  Abdominal:     General: Bowel sounds are normal. There is no distension.     Palpations: Abdomen is soft. There is no mass.  Tenderness: There is no abdominal tenderness.  Musculoskeletal:        General: No deformity. Normal range of motion.     Cervical back: Normal range of motion and neck supple.  Skin:    General: Skin is warm and dry.     Coloration: Skin is not pale.     Findings: No erythema or rash.  Neurological:     Mental Status: She is alert and oriented to person, place, and time.     Cranial Nerves: No cranial nerve deficit.     Coordination: Coordination normal.  Psychiatric:        Behavior: Behavior normal.        Thought Content: Thought content normal.       CMP Latest Ref Rng & Units 01/06/2020  Glucose 70 - 99 mg/dL 157(H)  BUN 8 - 23 mg/dL 24(H)  Creatinine 0.44 - 1.00 mg/dL 1.37(H)  Sodium 135 - 145 mmol/L 135  Potassium 3.5 - 5.1 mmol/L 4.0  Chloride 98 - 111 mmol/L 103  CO2 22 - 32 mmol/L 24  Calcium 8.9 - 10.3 mg/dL 9.6  Total Protein 6.5 - 8.1 g/dL -  Total Bilirubin 0.3 - 1.2 mg/dL -  Alkaline Phos 38 - 126 U/L -  AST 15 - 41 U/L -  ALT 0 - 44 U/L -   CBC Latest Ref Rng & Units 02/09/2020  WBC 4.0 - 10.5 K/uL 5.3  Hemoglobin 12.0 - 15.0 g/dL 11.8(L)  Hematocrit 36 - 46 % 34.8(L)  Platelets 150 - 400 K/uL 149(L)     LABORATORY DATA:  I have reviewed the data as listed Lab Results  Component Value Date   WBC 5.3 02/09/2020   HGB 11.8 (L) 02/09/2020   HCT 34.8 (L) 02/09/2020   MCV 86.1 02/09/2020   PLT 149 (L) 02/09/2020   Recent Labs    11/15/19 0840 11/20/19 0837 01/05/20 0900 01/06/20 1525  NA  --  137 137 135  K  --   --  5.0 4.0  CL  --   --  101 103  CO2  --   --  26 24  GLUCOSE  --   --  175* 157*  BUN  --   --  38* 24*  CREATININE 1.30*  --  1.90* 1.37*  CALCIUM  --   --  10.1 9.6  GFRNONAA  --   --  25* 37*  GFRAA  --   --  29* 43*  PROT  --   --  7.5  --   ALBUMIN   --  2.8* 3.7  --   AST  --   --  28  --   ALT  --   --  17  --   ALKPHOS  --   --  52  --   BILITOT  --   --  0.9  --   BILIDIR  --   --  0.2  --   IBILI  --   --  0.7  --    Iron/TIBC/Ferritin/ %Sat    Component Value Date/Time   IRON 73 02/09/2020 1425   TIBC 430 02/09/2020 1425   FERRITIN 78 02/09/2020 1425   IRONPCTSAT 17 02/09/2020 1425     No results found.    ASSESSMENT & PLAN:  1. Iron deficiency anemia, unspecified iron deficiency anemia type   2. Nausea without vomiting    #Iron deficiency anemia, Labs are reviewed and discussed with patient. She is currently  not on oral iron supplementation Iron store remains stable.  Hemoglobin is slightly decreased to 11.8.  Overall stable. No need for additional IV iron treatment at this point.  #Nausea, etiology unknown.  Questionable related to liver cirrhosis and ascites ?increased abdomen pressure.  Recommend patient continue follow-up with gastroenterology if symptoms persist.  Take antiemetics if needed. Continue surveillance endoscopy for varices.  Per patient patient is scheduled to have repeat EGD in September. . Follow-up in 1 year  Orders Placed This Encounter  Procedures  . CBC with Differential/Platelet    Standing Status:   Future    Standing Expiration Date:   02/11/2021  . Iron and TIBC    Standing Status:   Future    Standing Expiration Date:   02/11/2021  . Ferritin    Standing Status:   Future    Standing Expiration Date:   02/11/2021    All questions were answered. The patient knows to call the clinic with any problems questions or concerns.   Earlie Server, MD, PhD 02/12/2020

## 2020-02-12 NOTE — Progress Notes (Signed)
Patient reports having nausea for the past 3 days and started having left side abdominal pain last night with pain 3/10 today.

## 2020-02-20 ENCOUNTER — Other Ambulatory Visit: Payer: Self-pay

## 2020-02-20 ENCOUNTER — Ambulatory Visit
Admission: RE | Admit: 2020-02-20 | Discharge: 2020-02-20 | Disposition: A | Discharge status: Home / Self Care | Payer: Medicare Other | Source: Ambulatory Visit | Attending: Internal Medicine | Admitting: Internal Medicine

## 2020-02-20 DIAGNOSIS — Z1231 Encounter for screening mammogram for malignant neoplasm of breast: Secondary | ICD-10-CM | POA: Diagnosis present

## 2020-02-24 ENCOUNTER — Ambulatory Visit
Admission: RE | Admit: 2020-02-24 | Discharge: 2020-02-24 | Disposition: A | Discharge status: Home / Self Care | Payer: Medicare Other | Source: Ambulatory Visit | Attending: Internal Medicine | Admitting: Internal Medicine

## 2020-02-24 ENCOUNTER — Other Ambulatory Visit: Payer: Self-pay

## 2020-02-24 DIAGNOSIS — R519 Headache, unspecified: Secondary | ICD-10-CM

## 2020-04-09 ENCOUNTER — Other Ambulatory Visit: Payer: Self-pay | Admitting: Internal Medicine

## 2020-04-09 DIAGNOSIS — K7469 Other cirrhosis of liver: Secondary | ICD-10-CM

## 2020-04-17 ENCOUNTER — Other Ambulatory Visit: Payer: Self-pay | Admitting: Urology

## 2020-04-17 ENCOUNTER — Other Ambulatory Visit: Payer: Self-pay | Admitting: Nephrology

## 2020-04-17 DIAGNOSIS — E875 Hyperkalemia: Secondary | ICD-10-CM

## 2020-04-17 DIAGNOSIS — N1831 Chronic kidney disease, stage 3a: Secondary | ICD-10-CM

## 2020-04-18 ENCOUNTER — Other Ambulatory Visit: Payer: Self-pay | Admitting: Nephrology

## 2020-04-18 DIAGNOSIS — E875 Hyperkalemia: Secondary | ICD-10-CM

## 2020-04-18 DIAGNOSIS — N1831 Chronic kidney disease, stage 3a: Secondary | ICD-10-CM

## 2020-04-22 ENCOUNTER — Ambulatory Visit
Admission: RE | Admit: 2020-04-22 | Discharge: 2020-04-22 | Disposition: A | Discharge status: Home / Self Care | Payer: Medicare Other | Source: Ambulatory Visit | Attending: Internal Medicine | Admitting: Internal Medicine

## 2020-04-22 ENCOUNTER — Other Ambulatory Visit: Payer: Self-pay

## 2020-04-22 DIAGNOSIS — K7469 Other cirrhosis of liver: Secondary | ICD-10-CM | POA: Insufficient documentation

## 2020-05-01 ENCOUNTER — Other Ambulatory Visit: Payer: Self-pay

## 2020-05-01 ENCOUNTER — Ambulatory Visit
Admission: RE | Admit: 2020-05-01 | Discharge: 2020-05-01 | Disposition: A | Discharge status: Home / Self Care | Payer: Medicare Other | Source: Ambulatory Visit | Attending: Nephrology | Admitting: Nephrology

## 2020-05-01 DIAGNOSIS — E875 Hyperkalemia: Secondary | ICD-10-CM | POA: Diagnosis present

## 2020-05-01 DIAGNOSIS — N1831 Chronic kidney disease, stage 3a: Secondary | ICD-10-CM

## 2020-05-24 ENCOUNTER — Other Ambulatory Visit: Payer: Self-pay | Admitting: Gastroenterology

## 2020-05-24 DIAGNOSIS — R188 Other ascites: Secondary | ICD-10-CM

## 2020-06-03 DIAGNOSIS — N184 Chronic kidney disease, stage 4 (severe): Secondary | ICD-10-CM | POA: Insufficient documentation

## 2020-06-03 DIAGNOSIS — N1832 Chronic kidney disease, stage 3b: Secondary | ICD-10-CM | POA: Insufficient documentation

## 2020-06-04 ENCOUNTER — Other Ambulatory Visit: Payer: Self-pay

## 2020-06-04 ENCOUNTER — Ambulatory Visit
Admission: RE | Admit: 2020-06-04 | Discharge: 2020-06-04 | Disposition: A | Discharge status: Home / Self Care | Payer: Medicare Other | Source: Ambulatory Visit | Attending: Gastroenterology | Admitting: Gastroenterology

## 2020-06-04 DIAGNOSIS — R188 Other ascites: Secondary | ICD-10-CM | POA: Insufficient documentation

## 2020-06-04 DIAGNOSIS — K7460 Unspecified cirrhosis of liver: Secondary | ICD-10-CM | POA: Insufficient documentation

## 2020-10-16 DIAGNOSIS — R809 Proteinuria, unspecified: Secondary | ICD-10-CM | POA: Insufficient documentation

## 2020-10-16 DIAGNOSIS — I509 Heart failure, unspecified: Secondary | ICD-10-CM | POA: Insufficient documentation

## 2020-10-16 DIAGNOSIS — N2581 Secondary hyperparathyroidism of renal origin: Secondary | ICD-10-CM | POA: Insufficient documentation

## 2020-10-16 DIAGNOSIS — D631 Anemia in chronic kidney disease: Secondary | ICD-10-CM | POA: Insufficient documentation

## 2020-11-06 ENCOUNTER — Other Ambulatory Visit: Payer: Self-pay | Admitting: Gastroenterology

## 2020-11-06 DIAGNOSIS — R188 Other ascites: Secondary | ICD-10-CM

## 2020-11-06 DIAGNOSIS — L299 Pruritus, unspecified: Secondary | ICD-10-CM

## 2020-11-07 ENCOUNTER — Other Ambulatory Visit: Payer: Self-pay | Admitting: Gastroenterology

## 2020-11-07 DIAGNOSIS — K7460 Unspecified cirrhosis of liver: Secondary | ICD-10-CM

## 2020-11-20 DIAGNOSIS — M1000 Idiopathic gout, unspecified site: Secondary | ICD-10-CM | POA: Insufficient documentation

## 2020-11-20 DIAGNOSIS — L299 Pruritus, unspecified: Secondary | ICD-10-CM | POA: Insufficient documentation

## 2020-11-24 ENCOUNTER — Ambulatory Visit
Admission: RE | Admit: 2020-11-24 | Discharge: 2020-11-24 | Disposition: A | Discharge status: Home / Self Care | Payer: Medicare Other | Source: Ambulatory Visit | Attending: Gastroenterology | Admitting: Gastroenterology

## 2020-11-24 DIAGNOSIS — K7460 Unspecified cirrhosis of liver: Secondary | ICD-10-CM

## 2020-11-24 MED ORDER — GADOBENATE DIMEGLUMINE 529 MG/ML IV SOLN
17.0000 mL | Freq: Once | INTRAVENOUS | Status: AC | PRN
  Administered 2020-11-24: 17 mL via INTRAVENOUS

## 2021-02-04 ENCOUNTER — Other Ambulatory Visit: Payer: Self-pay | Admitting: Internal Medicine

## 2021-02-04 DIAGNOSIS — Z1231 Encounter for screening mammogram for malignant neoplasm of breast: Secondary | ICD-10-CM

## 2021-02-10 ENCOUNTER — Inpatient Hospital Stay: Payer: Medicare Other | Attending: Oncology

## 2021-02-10 ENCOUNTER — Inpatient Hospital Stay: Payer: Medicare Other

## 2021-02-10 DIAGNOSIS — D509 Iron deficiency anemia, unspecified: Secondary | ICD-10-CM

## 2021-02-10 DIAGNOSIS — D500 Iron deficiency anemia secondary to blood loss (chronic): Secondary | ICD-10-CM | POA: Diagnosis not present

## 2021-02-10 DIAGNOSIS — K7469 Other cirrhosis of liver: Secondary | ICD-10-CM | POA: Diagnosis not present

## 2021-02-10 LAB — CBC WITH DIFFERENTIAL/PLATELET
Abs Immature Granulocytes: 0.01 10*3/uL (ref 0.00–0.07)
Basophils Absolute: 0 10*3/uL (ref 0.0–0.1)
Basophils Relative: 1 %
Eosinophils Absolute: 0.2 10*3/uL (ref 0.0–0.5)
Eosinophils Relative: 4 %
HCT: 32.3 % — ABNORMAL LOW (ref 36.0–46.0)
Hemoglobin: 10.2 g/dL — ABNORMAL LOW (ref 12.0–15.0)
Immature Granulocytes: 0 %
Lymphocytes Relative: 25 %
Lymphs Abs: 1.1 10*3/uL (ref 0.7–4.0)
MCH: 26 pg (ref 26.0–34.0)
MCHC: 31.6 g/dL (ref 30.0–36.0)
MCV: 82.2 fL (ref 80.0–100.0)
Monocytes Absolute: 0.5 10*3/uL (ref 0.1–1.0)
Monocytes Relative: 11 %
Neutro Abs: 2.6 10*3/uL (ref 1.7–7.7)
Neutrophils Relative %: 59 %
Platelets: 183 10*3/uL (ref 150–400)
RBC: 3.93 MIL/uL (ref 3.87–5.11)
RDW: 16.8 % — ABNORMAL HIGH (ref 11.5–15.5)
WBC: 4.4 10*3/uL (ref 4.0–10.5)
nRBC: 0 % (ref 0.0–0.2)

## 2021-02-10 LAB — IRON AND TIBC
Iron: 48 ug/dL (ref 28–170)
Saturation Ratios: 9 % — ABNORMAL LOW (ref 10.4–31.8)
TIBC: 535 ug/dL — ABNORMAL HIGH (ref 250–450)
UIBC: 487 ug/dL

## 2021-02-10 LAB — FERRITIN: Ferritin: 17 ng/mL (ref 11–307)

## 2021-02-11 ENCOUNTER — Inpatient Hospital Stay (HOSPITAL_BASED_OUTPATIENT_CLINIC_OR_DEPARTMENT_OTHER): Payer: Medicare Other | Admitting: Oncology

## 2021-02-11 ENCOUNTER — Encounter: Payer: Self-pay | Admitting: Oncology

## 2021-02-11 VITALS — BP 136/53 | HR 61 | Temp 97.8°F | Resp 20 | Wt 175.2 lb

## 2021-02-11 DIAGNOSIS — K7469 Other cirrhosis of liver: Secondary | ICD-10-CM

## 2021-02-11 DIAGNOSIS — D500 Iron deficiency anemia secondary to blood loss (chronic): Secondary | ICD-10-CM | POA: Diagnosis not present

## 2021-02-11 NOTE — Progress Notes (Signed)
Hematology/Oncology  Follow up  note [facility] [phone] [phone]   Patient Care Team: Kirk Ruths, MD as PCP - General (Internal Medicine) Earlie Server, MD as Consulting Physician (Hematology and Oncology)  REFERRING PROVIDER: Kirk Ruths, MD  CHIEF COMPLAINTS/REASON FOR VISIT:  Evaluation of iron deficiency anemia  HISTORY OF PRESENTING ILLNESS:  Rhonda Tyler is a  80 y.o.  female with PMH listed below who was referred to me for evaluation of iron deficiency anemia Reviewed patient's recent labs that was done at [facility] office. 05/02/2018 Labs revealed anemia with hemoglobin of 9.3, MCV 74.7, wbc 6.1, platelet 343,000 Iron panel showed TIBC 602, iron saturation 9, ferritin 14.   Reviewed patient's previous labs ordered by primary care physician's office, anemia is chronic onset , duration is since April 2019 No aggravating or improving factors.  Associated signs and symptoms: Patient reports fatigue. Mild  SOB with exertion.  Denies weight loss, easy bruising, hematochezia, hemoptysis, hematuria. Context:  History of iron deficiency:  Rectal bleeding: deneis Hematemesis or hemoptysis : denies Blood in urine : denies  Last endoscopy: 03/08/2019 upper endoscopy showed many gastric polyps. 11/29/2017 The entire examined colon is normal. - No specimens collected  11/29/2017 Upper endosocpy, Multiple gastric polyps. The largest with stigmata of bleeding resected and retrieved. Clip (MR conditional) was placed. Pathology showed hyperplastic gastric polyps.  Fatigue: Yes.   She is on Aspirin 81mg  daily,   INTERVAL HISTORY Rhonda Tyler is a 80 y.o. female who has above history reviewed by me today presents for follow up visit for management of iron deficiency anemia Problems and complaints are listed below: Patient follows up with gastroenterology for cirrhosis. She denies any black or bloody stool.  Denies any pain.     Review of Systems  Constitutional:  Positive for fatigue. Negative for appetite change, chills and fever.  HENT:   Negative for hearing loss and voice change.   Eyes:  Negative for eye problems.  Respiratory:  Negative for chest tightness and cough.   Cardiovascular:  Negative for chest pain.  Gastrointestinal:  Negative for abdominal distention, abdominal pain, blood in stool and nausea.  Endocrine: Negative for hot flashes.  Genitourinary:  Negative for difficulty urinating and frequency.   Musculoskeletal:  Negative for arthralgias.  Skin:  Negative for itching and rash.  Neurological:  Negative for extremity weakness.  Hematological:  Negative for adenopathy.  Psychiatric/Behavioral:  Negative for confusion.    MEDICAL HISTORY:  Past Medical History:  Diagnosis Date   Anginal pain ([place])    Arthritis    B12 deficiency anemia    Cervical disc disease    Cervical disc disease    Chest pain    Cholecystolithiasis    Cirrhosis of liver ([place]) 11/2019   Colon polyp    Coronary artery disease    Cutaneous sarcoidosis    Diabetes mellitus without complication (HCC)    Pt takes insulin   Diabetic neuropathy (HCC)    GERD (gastroesophageal reflux disease)    Hemorrhoid    History of diabetic neuropathy    Hypercalcemia    Hyperlipemia    Hypertension    IBS (irritable bowel syndrome)    Iron deficiency anemia    Myocardial infarction [facility])    Sleep apnea     SURGICAL HISTORY: Past Surgical History:  Procedure Laterality Date   ABDOMINAL HYSTERECTOMY     arthroscopic rotator cuff repair     BACK SURGERY  CARDIAC CATHETERIZATION     CHOLECYSTECTOMY     COLONOSCOPY     COLONOSCOPY WITH PROPOFOL N/A 11/29/2017   Procedure: COLONOSCOPY WITH PROPOFOL;  Surgeon: Lollie Sails, MD;  Location[facility] ENDOSCOPY;  Service: Endoscopy;  Laterality: N/A;   CORONARY ANGIOPLASTY     PTCA and stent of RCA   CORONARY ARTERY BYPASS GRAFT     ESOPHAGOGASTRODUODENOSCOPY      ESOPHAGOGASTRODUODENOSCOPY (EGD) WITH PROPOFOL N/A 11/29/2017   Procedure: ESOPHAGOGASTRODUODENOSCOPY (EGD) WITH PROPOFOL;  Surgeon: Lollie Sails, MD;  Location: [facility] ENDOSCOPY;  Service: Endoscopy;  Laterality: N/A;   ESOPHAGOGASTRODUODENOSCOPY (EGD) WITH PROPOFOL N/A 03/07/2018   Procedure: ESOPHAGOGASTRODUODENOSCOPY (EGD) WITH PROPOFOL;  Surgeon: Lollie Sails, MD;  Location: [facility] ENDOSCOPY;  Service: Endoscopy;  Laterality: N/A;   ESOPHAGOGASTRODUODENOSCOPY (EGD) WITH PROPOFOL N/A 08/16/2019   Procedure: ESOPHAGOGASTRODUODENOSCOPY (EGD) WITH PROPOFOL;  Surgeon: Toledo, Benay Pike, MD;  Location: [facility] ENDOSCOPY;  Service: Gastroenterology;  Laterality: N/A;   EXCISION OF ADNEXAL MASS     JOINT REPLACEMENT     left total knee replacement   OOPHORECTOMY     vesicular vaginal fistula      SOCIAL HISTORY: Social History   Socioeconomic History   Marital status: Married    Spouse name: Not on file   Number of children: Not on file   Years of education: Not on file   Highest education level: Not on file  Occupational History   Occupation: retired  Tobacco Use   Smoking status: Former    Types: Cigarettes    Quit date: 12/01/1970    Years since quitting: 50.2   Smokeless tobacco: Never  Vaping Use   Vaping Use: Never used  Substance and Sexual Activity   Alcohol use: Not Currently   Drug use: Never   Sexual activity: Not on file  Other Topics Concern   Not on file  Social History Narrative   Not on file   Social Determinants of Health   Financial Resource Strain: Not on file  Food Insecurity: Not on file  Transportation Needs: Not on file  Physical Activity: Not on file  Stress: Not on file  Social Connections: Not on file  Intimate Partner Violence: Not on file    FAMILY HISTORY: Family History  Problem Relation Age of Onset   Hypertension Mother    Heart attack Mother    Hypertension Father    Heart attack Father    Skin cancer Father    Heart attack  Sister    Breast cancer Neg Hx     ALLERGIES:  is allergic to allegra [fexofenadine], codeine, levaquin [levofloxacin in d5w], lipitor [atorvastatin calcium], lopressor [metoprolol tartrate], penicillins, potassium-containing compounds, procardia [nifedipine], sucralfate, sulfa antibiotics, and tramadol.  MEDICATIONS:  Current Outpatient Medications  Medication Sig Dispense Refill   acetaminophen (TYLENOL) 500 MG tablet Take 500-1,000 mg by mouth every 4 (four) hours as needed for mild pain or fever.      aspirin EC 81 MG tablet Take 81 mg by mouth daily.     diclofenac Sodium (VOLTAREN) 1 % GEL Apply 2 g topically 4 (four) times daily. Apply to left shoulder. 50 g 0   fenofibrate 160 MG tablet Take 160 mg by mouth daily.     furosemide (LASIX) 20 MG tablet Take 20 mg by mouth daily.     hydroxychloroquine (PLAQUENIL) 200 MG tablet Take 200 mg by mouth daily.     hydrOXYzine (ATARAX/VISTARIL) 25 MG tablet Take by mouth.  insulin NPH-regular Human (NOVOLIN 70/30) (70-30) 100 UNIT/ML injection Inject 30-45 Units into the skin See admin instructions. Inject up to 45u under the skin every morning and up to 30u under the skin every night     insulin regular (NOVOLIN R,HUMULIN R) 100 units/mL injection Inject 4-10 Units into the skin daily before lunch.     lisinopril (PRINIVIL,ZESTRIL) 40 MG tablet Take 40 mg by mouth daily.     loratadine (CLARITIN) 10 MG tablet Take 10 mg by mouth daily.     magnesium oxide (MAG-OX) 400 MG tablet Take 400 mg by mouth 2 (two) times daily.      meclizine (ANTIVERT) 25 MG tablet Take 25 mg by mouth every 4 (four) hours as needed.     nitroGLYCERIN (NITROSTAT) 0.4 MG SL tablet Place 0.4 mg under the tongue every 5 (five) minutes as needed for chest pain.     ondansetron (ZOFRAN) 4 MG tablet Take by mouth.     pantoprazole (PROTONIX) 40 MG tablet Take 40 mg by mouth 2 (two) times daily.     potassium chloride SA (KLOR-CON) 20 MEQ tablet Take 20 mEq by mouth  daily.     propranolol (INDERAL) 40 MG tablet Take 40 mg by mouth 2 (two) times daily.      vitamin B-12 (CYANOCOBALAMIN) 1000 MCG tablet Take 2,000 mcg by mouth daily.      allopurinol (ZYLOPRIM) 100 MG tablet Take 100 mg by mouth daily. (Patient not taking: Reported on 02/11/2021)     No current facility-administered medications for this visit.     PHYSICAL EXAMINATION: ECOG PERFORMANCE STATUS: 1 - Symptomatic but completely ambulatory Vitals:   02/11/21 1307  BP: (!) 136/53  Pulse: 61  Resp: 20  Temp: 97.8 F (36.6 C)  SpO2: 100%   Filed Weights   02/11/21 1307  Weight: 175 lb 3.2 oz (79.5 kg)    Physical Exam Constitutional:      General: She is not in acute distress. HENT:     Head: Normocephalic and atraumatic.  Eyes:     General: No scleral icterus.    Pupils: Pupils are equal, round, and reactive to light.  Cardiovascular:     Rate and Rhythm: Normal rate and regular rhythm.     Heart sounds: Normal heart sounds.  Pulmonary:     Effort: Pulmonary effort is normal. No respiratory distress.     Breath sounds: No wheezing.  Abdominal:     General: Bowel sounds are normal. There is no distension.     Palpations: Abdomen is soft. There is no mass.     Tenderness: There is no abdominal tenderness.  Musculoskeletal:        General: No deformity. Normal range of motion.     Cervical back: Normal range of motion and neck supple.  Skin:    General: Skin is warm and dry.     Coloration: Skin is not pale.     Findings: No erythema or rash.  Neurological:     Mental Status: She is alert and oriented to person, place, and time.     Cranial Nerves: No cranial nerve deficit.     Coordination: Coordination normal.  Psychiatric:        Behavior: Behavior normal.        Thought Content: Thought content normal.      CMP Latest Ref Rng & Units 01/06/2020  Glucose 70 - 99 mg/dL 157(H)  BUN 8 - 23 mg/dL 24(H)  Creatinine 0.44 -  1.00 mg/dL 1.37(H)  Sodium 135 - 145  mmol/L 135  Potassium 3.5 - 5.1 mmol/L 4.0  Chloride 98 - 111 mmol/L 103  CO2 22 - 32 mmol/L 24  Calcium 8.9 - 10.3 mg/dL 9.6  Total Protein 6.5 - 8.1 g/dL -  Total Bilirubin 0.3 - 1.2 mg/dL -  Alkaline Phos 38 - 126 U/L -  AST 15 - 41 U/L -  ALT 0 - 44 U/L -   CBC Latest Ref Rng & Units 02/10/2021  WBC 4.0 - 10.5 K/uL 4.4  Hemoglobin 12.0 - 15.0 g/dL 10.2(L)  Hematocrit 36.0 - 46.0 % 32.3(L)  Platelets 150 - 400 K/uL 183     LABORATORY DATA:  I have reviewed the data as listed Lab Results  Component Value Date   WBC 4.4 02/10/2021   HGB 10.2 (L) 02/10/2021   HCT 32.3 (L) 02/10/2021   MCV 82.2 02/10/2021   PLT 183 02/10/2021   No results for input(s): NA, K, CL, CO2, GLUCOSE, BUN, CREATININE, CALCIUM, GFRNONAA, GFRAA, PROT, ALBUMIN, AST, ALT, ALKPHOS, BILITOT, BILIDIR, IBILI in the last 8760 hours.  Iron/TIBC/Ferritin/ %Sat    Component Value Date/Time   IRON 48 02/10/2021 1435   TIBC 535 (H) 02/10/2021 1435   FERRITIN 17 02/10/2021 1435   IRONPCTSAT 9 (L) 02/10/2021 1435     No results found.    ASSESSMENT & PLAN:  1. Iron deficiency anemia due to chronic blood loss   2. Other cirrhosis of liver (HCC)    #Iron deficiency anemia, Labs reviewed and discussed with patient .  Hemoglobin has decreased to 10.8. Iron panel is consistent with iron deficiency.  Patient has previously received IV Venofer treatment and tolerated well. Discussed about option of resuming IV iron treatment and she agrees  Plan IV Venofer weekly x4.  Cryptogenic liver cirrhosis, recommend patient to continue follow-up with gastroenterology for varices surveillance and [place] surveillance. . Follow-up in 6 months, lab MD +/- Venofer.  Orders Placed This Encounter  Procedures   CBC with Differential/Platelet    Standing Status:   Future    Standing Expiration Date:   02/11/2022   Comprehensive metabolic panel    Standing Status:   Future    Standing Expiration Date:   02/11/2022   Iron  and TIBC    Standing Status:   Future    Standing Expiration Date:   02/11/2022   Ferritin    Standing Status:   Future    Standing Expiration Date:   02/11/2022    All questions were answered. The patient knows to call the clinic with any problems questions or concerns.   Earlie Server, MD, PhD 02/11/2021

## 2021-02-19 ENCOUNTER — Other Ambulatory Visit: Payer: Self-pay

## 2021-02-19 ENCOUNTER — Inpatient Hospital Stay: Payer: Medicare Other

## 2021-02-19 VITALS — BP 102/36 | HR 62 | Temp 96.0°F | Resp 18

## 2021-02-19 DIAGNOSIS — D500 Iron deficiency anemia secondary to blood loss (chronic): Secondary | ICD-10-CM | POA: Diagnosis not present

## 2021-02-19 DIAGNOSIS — D509 Iron deficiency anemia, unspecified: Secondary | ICD-10-CM

## 2021-02-19 MED ORDER — IRON SUCROSE 20 MG/ML IV SOLN
200.0000 mg | Freq: Once | INTRAVENOUS | Status: AC
  Administered 2021-02-19: 200 mg via INTRAVENOUS
  Filled 2021-02-19: qty 10

## 2021-02-19 MED ORDER — SODIUM CHLORIDE 0.9 % IV SOLN
Freq: Once | INTRAVENOUS | Status: AC
  Filled 2021-02-19: qty 250

## 2021-02-19 NOTE — Patient Instructions (Signed)

## 2021-02-26 ENCOUNTER — Inpatient Hospital Stay: Payer: Medicare Other

## 2021-02-26 ENCOUNTER — Other Ambulatory Visit: Payer: Self-pay

## 2021-02-26 VITALS — BP 117/71 | HR 63 | Temp 96.7°F | Resp 18

## 2021-02-26 DIAGNOSIS — D509 Iron deficiency anemia, unspecified: Secondary | ICD-10-CM

## 2021-02-26 DIAGNOSIS — D500 Iron deficiency anemia secondary to blood loss (chronic): Secondary | ICD-10-CM | POA: Diagnosis not present

## 2021-02-26 MED ORDER — IRON SUCROSE 20 MG/ML IV SOLN
200.0000 mg | Freq: Once | INTRAVENOUS | Status: AC
  Administered 2021-02-26: 200 mg via INTRAVENOUS
  Filled 2021-02-26: qty 10

## 2021-02-26 MED ORDER — SODIUM CHLORIDE 0.9 % IV SOLN
Freq: Once | INTRAVENOUS | Status: AC
  Filled 2021-02-26: qty 250

## 2021-02-26 NOTE — Patient Instructions (Signed)
[facility] [place] [facility]   Discharge Instructions: Thank you for choosing [facility] to provide your oncology and hematology care.  If you have a lab appointment with the Cancer Center, please go directly to the Cancer Center and check in at the registration area.  We strive to give you quality time with your provider. You may need to reschedule your appointment if you arrive late (15 or more minutes).  Arriving late affects you and other patients whose appointments are after yours.  Also, if you miss three or more appointments without notifying the office, you may be dismissed from the clinic at the provider's discretion.      For prescription refill requests, have your pharmacy contact our office and allow 72 hours for refills to be completed.    Today you received the following: Venofer.      BELOW ARE SYMPTOMS THAT SHOULD BE REPORTED IMMEDIATELY: *FEVER GREATER THAN 100.4 F (38 C) OR HIGHER *CHILLS OR SWEATING *NAUSEA AND VOMITING THAT IS NOT CONTROLLED WITH YOUR NAUSEA MEDICATION *UNUSUAL SHORTNESS OF BREATH *UNUSUAL BRUISING OR BLEEDING *URINARY PROBLEMS (pain or burning when urinating, or frequent urination) *BOWEL PROBLEMS (unusual diarrhea, constipation, pain near the anus) TENDERNESS IN MOUTH AND THROAT WITH OR WITHOUT PRESENCE OF ULCERS (sore throat, sores in mouth, or a toothache) UNUSUAL RASH, SWELLING OR PAIN  UNUSUAL VAGINAL DISCHARGE OR ITCHING   Items with * indicate a potential emergency and should be followed up as soon as possible or go to the Emergency Department if any problems should occur.  Should you have questions after your visit or need to cancel or reschedule your appointment, please contact [facility] [place] [facility]  [phone] and follow the prompts.  Office hours are 8:00 a.m. to 4:30 p.m. Monday - Friday. Please note that voicemails left after 4:00 p.m. may not be returned until the following  business day.  We are closed weekends and major holidays. You have access to a nurse at all times for urgent questions. Please call the main number to the clinic [phone] and follow the prompts.  For any non-urgent questions, you may also contact your provider using MyChart. We now offer e-Visits for anyone 18 and older to request care online for non-urgent symptoms. For details visit [email or web address].   Also download the MyChart app! Go to the app store, search "MyChart", open the app, select , and log in with your MyChart username and password.  Due to Covid, a mask is required upon entering the hospital/clinic. If you do not have a mask, one will be given to you upon arrival. For doctor visits, patients may have 1 support person aged 18 or older with them. For treatment visits, patients cannot have anyone with them due to current Covid guidelines and our immunocompromised population.  

## 2021-03-05 ENCOUNTER — Inpatient Hospital Stay: Payer: Medicare Other

## 2021-03-05 ENCOUNTER — Other Ambulatory Visit: Payer: Self-pay

## 2021-03-05 ENCOUNTER — Other Ambulatory Visit: Payer: Self-pay | Admitting: Unknown Physician Specialty

## 2021-03-05 VITALS — BP 117/40 | HR 62

## 2021-03-05 DIAGNOSIS — D500 Iron deficiency anemia secondary to blood loss (chronic): Secondary | ICD-10-CM | POA: Diagnosis not present

## 2021-03-05 DIAGNOSIS — R430 Anosmia: Secondary | ICD-10-CM

## 2021-03-05 DIAGNOSIS — D509 Iron deficiency anemia, unspecified: Secondary | ICD-10-CM

## 2021-03-05 MED ORDER — IRON SUCROSE 20 MG/ML IV SOLN
200.0000 mg | Freq: Once | INTRAVENOUS | Status: AC
  Administered 2021-03-05: 200 mg via INTRAVENOUS
  Filled 2021-03-05: qty 10

## 2021-03-05 MED ORDER — SODIUM CHLORIDE 0.9 % IV SOLN
INTRAVENOUS | Status: DC
  Filled 2021-03-05: qty 250

## 2021-03-05 NOTE — Patient Instructions (Signed)

## 2021-03-12 ENCOUNTER — Inpatient Hospital Stay: Payer: Medicare Other

## 2021-04-02 ENCOUNTER — Other Ambulatory Visit: Payer: Self-pay

## 2021-04-02 ENCOUNTER — Other Ambulatory Visit: Payer: Self-pay | Admitting: Gastroenterology

## 2021-04-02 ENCOUNTER — Inpatient Hospital Stay: Payer: Medicare Other | Attending: Oncology

## 2021-04-02 VITALS — BP 107/43 | HR 67 | Temp 98.8°F

## 2021-04-02 DIAGNOSIS — R188 Other ascites: Secondary | ICD-10-CM

## 2021-04-02 DIAGNOSIS — K7460 Unspecified cirrhosis of liver: Secondary | ICD-10-CM

## 2021-04-02 DIAGNOSIS — D509 Iron deficiency anemia, unspecified: Secondary | ICD-10-CM | POA: Diagnosis present

## 2021-04-02 MED ORDER — SODIUM CHLORIDE 0.9 % IV SOLN
Freq: Once | INTRAVENOUS | Status: AC
  Filled 2021-04-02: qty 250

## 2021-04-02 MED ORDER — IRON SUCROSE 20 MG/ML IV SOLN
200.0000 mg | Freq: Once | INTRAVENOUS | Status: AC
  Administered 2021-04-02: 200 mg via INTRAVENOUS
  Filled 2021-04-02: qty 10

## 2021-04-05 ENCOUNTER — Other Ambulatory Visit: Payer: Self-pay

## 2021-04-05 ENCOUNTER — Ambulatory Visit
Admission: RE | Admit: 2021-04-05 | Discharge: 2021-04-05 | Disposition: A | Discharge status: Home / Self Care | Payer: Medicare Other | Source: Ambulatory Visit | Attending: Unknown Physician Specialty | Admitting: Unknown Physician Specialty

## 2021-04-05 DIAGNOSIS — R430 Anosmia: Secondary | ICD-10-CM

## 2021-04-05 MED ORDER — GADOBENATE DIMEGLUMINE 529 MG/ML IV SOLN
15.0000 mL | Freq: Once | INTRAVENOUS | Status: AC | PRN
  Administered 2021-04-05: 15 mL via INTRAVENOUS

## 2021-04-10 HISTORY — PX: JOINT REPLACEMENT: SHX530

## 2021-04-18 ENCOUNTER — Other Ambulatory Visit: Payer: Self-pay

## 2021-04-18 ENCOUNTER — Ambulatory Visit
Admission: RE | Admit: 2021-04-18 | Discharge: 2021-04-18 | Disposition: A | Discharge status: Home / Self Care | Payer: Medicare Other | Source: Ambulatory Visit | Attending: Gastroenterology | Admitting: Gastroenterology

## 2021-04-18 DIAGNOSIS — R188 Other ascites: Secondary | ICD-10-CM | POA: Diagnosis present

## 2021-04-18 DIAGNOSIS — K7460 Unspecified cirrhosis of liver: Secondary | ICD-10-CM | POA: Insufficient documentation

## 2021-04-21 ENCOUNTER — Emergency Department: Payer: Medicare Other

## 2021-04-21 ENCOUNTER — Emergency Department
Admission: EM | Admit: 2021-04-21 | Discharge: 2021-04-21 | Disposition: A | Discharge status: Home / Self Care | Payer: Medicare Other | Attending: Emergency Medicine | Admitting: Emergency Medicine

## 2021-04-21 ENCOUNTER — Other Ambulatory Visit: Payer: Self-pay

## 2021-04-21 DIAGNOSIS — S0990XA Unspecified injury of head, initial encounter: Secondary | ICD-10-CM | POA: Diagnosis not present

## 2021-04-21 DIAGNOSIS — Z79899 Other long term (current) drug therapy: Secondary | ICD-10-CM | POA: Diagnosis not present

## 2021-04-21 DIAGNOSIS — S8992XA Unspecified injury of left lower leg, initial encounter: Secondary | ICD-10-CM | POA: Insufficient documentation

## 2021-04-21 DIAGNOSIS — I10 Essential (primary) hypertension: Secondary | ICD-10-CM | POA: Diagnosis not present

## 2021-04-21 DIAGNOSIS — S8990XA Unspecified injury of unspecified lower leg, initial encounter: Secondary | ICD-10-CM

## 2021-04-21 DIAGNOSIS — I2510 Atherosclerotic heart disease of native coronary artery without angina pectoris: Secondary | ICD-10-CM | POA: Diagnosis not present

## 2021-04-21 DIAGNOSIS — Z7982 Long term (current) use of aspirin: Secondary | ICD-10-CM | POA: Diagnosis not present

## 2021-04-21 DIAGNOSIS — Z951 Presence of aortocoronary bypass graft: Secondary | ICD-10-CM | POA: Diagnosis not present

## 2021-04-21 DIAGNOSIS — W19XXXA Unspecified fall, initial encounter: Secondary | ICD-10-CM

## 2021-04-21 DIAGNOSIS — Z87891 Personal history of nicotine dependence: Secondary | ICD-10-CM | POA: Insufficient documentation

## 2021-04-21 DIAGNOSIS — E1143 Type 2 diabetes mellitus with diabetic autonomic (poly)neuropathy: Secondary | ICD-10-CM | POA: Insufficient documentation

## 2021-04-21 DIAGNOSIS — Z96652 Presence of left artificial knee joint: Secondary | ICD-10-CM | POA: Diagnosis not present

## 2021-04-21 DIAGNOSIS — W01198A Fall on same level from slipping, tripping and stumbling with subsequent striking against other object, initial encounter: Secondary | ICD-10-CM | POA: Diagnosis not present

## 2021-04-21 DIAGNOSIS — Z794 Long term (current) use of insulin: Secondary | ICD-10-CM | POA: Insufficient documentation

## 2021-04-21 LAB — CBG MONITORING, ED
Glucose-Capillary: 78 mg/dL (ref 70–99)
Glucose-Capillary: 93 mg/dL (ref 70–99)

## 2021-04-21 MED ORDER — IBUPROFEN 600 MG PO TABS
600.0000 mg | ORAL_TABLET | Freq: Once | ORAL | Status: AC
  Administered 2021-04-21: 600 mg via ORAL
  Filled 2021-04-21: qty 1

## 2021-04-21 MED ORDER — ACETAMINOPHEN 325 MG PO TABS
650.0000 mg | ORAL_TABLET | Freq: Once | ORAL | Status: AC
  Administered 2021-04-21: 650 mg via ORAL
  Filled 2021-04-21: qty 2

## 2021-04-21 NOTE — ED Provider Notes (Signed)
  Emergency Medicine Provider Triage Evaluation Note  Rhonda Tyler , a 80 y.o.female,  was evaluated in triage.  Pt complains of left knee pain.  Patient states that she was trying to get in her car when she felt her left knee give out.  She fell to the ground but did not lose consciousness.  She is currently feeling pain inside her kneecap she says.  Denies tibia/fibula pain, hip pain, or numbness/tingling in her lower extremities.  No other recent illnesses or injuries.       Review of Systems  Positive: Left knee pain. Negative: Denies fever, chest pain, vomiting  Physical Exam  There were no vitals filed for this visit. Gen:   Awake, no distress   Resp:  Normal effort  MSK:   Moves extremities without difficulty  Other:  Tenderness when palpating the left knee and with flexion.  Medical Decision Making  Given the patient's initial medical screening exam, the following diagnostic evaluation has been ordered. The patient will be placed in the appropriate treatment space, once one is available, to complete the evaluation and treatment. I have discussed the plan of care with the patient and I have advised the patient that an ED physician or mid-level practitioner will reevaluate their condition after the test results have been received, as the results may give them additional insight into the type of treatment they may need.    Diagnostics: Left knee x-ray.  Treatments: none immediately   Teodoro Spray, [place] 04/21/21 1630    Nance Pear, MD 04/21/21 2021

## 2021-04-21 NOTE — ED Triage Notes (Signed)
First Nurse Note: While in the grocery store, blood sugar read low, ate a glucose tab and sugar improved.  Patient then fell when getting out to car. No LOC.  Did hit head.  C/O left knee pain.

## 2021-04-21 NOTE — ED Triage Notes (Signed)
See first nurse note, pt reports fall at EMS from left knee giving out and hypoglycemia. Ate gluco tab PTA. Cbg 78. Reports still low for her, OJ given Alert and oriented.  Reports hitting head, no blood thinner use, no laceration or bleeding noted

## 2021-04-21 NOTE — ED Provider Notes (Signed)
[facility] Emergency Department Provider Note    ____________________________________________   Event Date/Time   First MD Initiated Contact with Patient 04/21/21 2049     (approximate)  I have reviewed the triage vital signs and the nursing notes.   HISTORY  Chief Complaint Fall   HPI Rhonda Tyler is a 80 y.o. female, history of hypertension, MI, GERD, diabetes, presents to the emergency department for evaluation of left knee pain.  Patient states that she was getting out of her car when she felt her left knee give out.  She fell to the ground on her back.  She states that she did hit her head.  She states that she currently feels well, however her left knee hurts significantly.  Denies fever/chills, LOC, abdominal pain, chest pain, SOB, or neck pain   History limited by: No limitations  Past Medical History:  Diagnosis Date   Anginal pain (HCC)    Arthritis    B12 deficiency anemia    Cervical disc disease    Cervical disc disease    Chest pain    Cholecystolithiasis    Cirrhosis of liver ([place]) 11/2019   Colon polyp    Coronary artery disease    Cutaneous sarcoidosis    Diabetes mellitus without complication ([place])    Pt takes insulin   Diabetic neuropathy (HCC)    GERD (gastroesophageal reflux disease)    Hemorrhoid    History of diabetic neuropathy    Hypercalcemia    Hyperlipemia    Hypertension    IBS (irritable bowel syndrome)    Iron deficiency anemia    Myocardial infarction [facility])    Sleep apnea     Patient Active Problem List   Diagnosis Date Noted   Arthritis of left shoulder region 01/06/2020   Iron deficiency 01/06/2020   Chest pain 01/05/2020   AKI (acute kidney injury) ([place])    Essential hypertension    Other cirrhosis of liver ([place])    Hypercalcemia 02/13/2019   Iron deficiency anemia 06/30/2018   Acute respiratory failure with hypoxia ([place]) 03/23/2018    Past Surgical History:  Procedure Laterality Date    ABDOMINAL HYSTERECTOMY     arthroscopic rotator cuff repair     BACK SURGERY     CARDIAC CATHETERIZATION     CHOLECYSTECTOMY     COLONOSCOPY     COLONOSCOPY WITH PROPOFOL N/A 11/29/2017   Procedure: COLONOSCOPY WITH PROPOFOL;  Surgeon: Lollie Sails, MD;  Location: [facility] ENDOSCOPY;  Service: Endoscopy;  Laterality: N/A;   CORONARY ANGIOPLASTY     PTCA and stent of RCA   CORONARY ARTERY BYPASS GRAFT     ESOPHAGOGASTRODUODENOSCOPY     ESOPHAGOGASTRODUODENOSCOPY (EGD) WITH PROPOFOL N/A 11/29/2017   Procedure: ESOPHAGOGASTRODUODENOSCOPY (EGD) WITH PROPOFOL;  Surgeon: Lollie Sails, MD;  Location: [facility] ENDOSCOPY;  Service: Endoscopy;  Laterality: N/A;   ESOPHAGOGASTRODUODENOSCOPY (EGD) WITH PROPOFOL N/A 03/07/2018   Procedure: ESOPHAGOGASTRODUODENOSCOPY (EGD) WITH PROPOFOL;  Surgeon: Lollie Sails, MD;  Location: [facility] ENDOSCOPY;  Service: Endoscopy;  Laterality: N/A;   ESOPHAGOGASTRODUODENOSCOPY (EGD) WITH PROPOFOL N/A 08/16/2019   Procedure: ESOPHAGOGASTRODUODENOSCOPY (EGD) WITH PROPOFOL;  Surgeon: Toledo, Benay Pike, MD;  Location: [facility] ENDOSCOPY;  Service: Gastroenterology;  Laterality: N/A;   EXCISION OF ADNEXAL MASS     JOINT REPLACEMENT     left total knee replacement   OOPHORECTOMY     vesicular vaginal fistula      Prior to Admission medications   Medication Sig Start Date End Date  Taking? Authorizing Provider  acetaminophen (TYLENOL) 500 MG tablet Take 500-1,000 mg by mouth every 4 (four) hours as needed for mild pain or fever.     [provider]  allopurinol (ZYLOPRIM) 100 MG tablet Take 100 mg by mouth daily. Patient not taking: Reported on 02/11/2021    [provider]  aspirin EC 81 MG tablet Take 81 mg by mouth daily.    [provider]  diclofenac Sodium (VOLTAREN) 1 % GEL Apply 2 g topically 4 (four) times daily. Apply to left shoulder. 01/06/20   Arrien, Jimmy Picket, MD  fenofibrate 160 MG tablet Take 160 mg by mouth daily.     [provider]  furosemide (LASIX) 20 MG tablet Take 20 mg by mouth daily.    [provider]  hydroxychloroquine (PLAQUENIL) 200 MG tablet Take 200 mg by mouth daily. 12/29/19   [provider]  hydrOXYzine (ATARAX/VISTARIL) 25 MG tablet Take by mouth. 12/13/20   [provider]  insulin NPH-regular Human (NOVOLIN 70/30) (70-30) 100 UNIT/ML injection Inject 30-45 Units into the skin See admin instructions. Inject up to 45u under the skin every morning and up to 30u under the skin every night    [provider]  insulin regular (NOVOLIN R,HUMULIN R) 100 units/mL injection Inject 4-10 Units into the skin daily before lunch.    [provider]  lisinopril (PRINIVIL,ZESTRIL) 40 MG tablet Take 40 mg by mouth daily.    [provider]  loratadine (CLARITIN) 10 MG tablet Take 10 mg by mouth daily.    [provider]  magnesium oxide (MAG-OX) 400 MG tablet Take 400 mg by mouth 2 (two) times daily.     [provider]  meclizine (ANTIVERT) 25 MG tablet Take 25 mg by mouth every 4 (four) hours as needed. 11/30/19   [provider]  nitroGLYCERIN (NITROSTAT) 0.4 MG SL tablet Place 0.4 mg under the tongue every 5 (five) minutes as needed for chest pain.    [provider]  ondansetron (ZOFRAN) 4 MG tablet Take by mouth. 09/19/20   [provider]  pantoprazole (PROTONIX) 40 MG tablet Take 40 mg by mouth 2 (two) times daily.    [provider]  potassium chloride SA (KLOR-CON) 20 MEQ tablet Take 20 mEq by mouth daily.    [provider]  propranolol (INDERAL) 40 MG tablet Take 40 mg by mouth 2 (two) times daily.     [provider]  vitamin B-12 (CYANOCOBALAMIN) 1000 MCG tablet Take 2,000 mcg by mouth daily.     [provider]    Allergies Allegra [fexofenadine], Codeine, Levaquin [levofloxacin in d5w], Lipitor [atorvastatin calcium], Lopressor [metoprolol tartrate],  Penicillins, Potassium-containing compounds, Procardia [nifedipine], Sucralfate, Sulfa antibiotics, and Tramadol  Family History  Problem Relation Age of Onset   Hypertension Mother    Heart attack Mother    Hypertension Father    Heart attack Father    Skin cancer Father    Heart attack Sister    Breast cancer Neg Hx     Social History Social History   Tobacco Use   Smoking status: Former    Types: Cigarettes    Quit date: 12/01/1970    Years since quitting: 50.4   Smokeless tobacco: Never  Vaping Use   Vaping Use: Never used  Substance Use Topics   Alcohol use: Not Currently   Drug use: Never    Review of Systems  Constitutional: Negative for fever/chills, weight loss, or fatigue.  Eyes: Negative for visual changes or discharge.  ENT: Negative for congestion, hearing changes, or sore throat.  Gastrointestinal: Negative for abdominal pain, nausea/vomiting, or diarrhea.  Genitourinary: Negative for dysuria or hematuria.  Musculoskeletal: Positive for left knee pain.  Negative for back pain or joint pain.  Skin: Negative for rashes or lesions.  Neurological: Negative for headache, syncope, dizziness, tremors, or numbness/tingling.   10-point ROS otherwise negative. ____________________________________________   PHYSICAL EXAM:  VITAL SIGNS: ED Triage Vitals [04/21/21 1647]  Enc Vitals Group     BP (!) 126/55     Pulse Rate 65     Resp 18     Temp 97.8 F (36.6 C)     Temp Source Oral     SpO2 100 %     Weight 163 lb (73.9 kg)     Height 5\' 3"  (1.6 m)     Head Circumference      Peak Flow      Pain Score 0     Pain Loc      Pain Edu?      Excl. in [place]?     Physical Exam Constitutional:      Appearance: Normal appearance.  HENT:     Head: Normocephalic and atraumatic.     Nose: Nose normal.     Mouth/Throat:     Mouth: Mucous membranes are moist.     Pharynx: Oropharynx is clear.  Eyes:     Extraocular Movements: Extraocular movements intact.      Pupils: Pupils are equal, round, and reactive to light.  Cardiovascular:     Rate and Rhythm: Normal rate and regular rhythm.  Pulmonary:     Effort: Pulmonary effort is normal.     Breath sounds: Normal breath sounds.  Abdominal:     General: Abdomen is flat.     Palpations: Abdomen is soft.  Musculoskeletal:     Cervical back: Normal range of motion and neck supple. No tenderness.     Comments: Swelling appreciated in the left knee.  No erythema.  Patient endorses pain when palpating the anterior and posterior aspect of the knee, as well as with passive flexion.  Pulse, motor, sensation intact bilaterally.  No gross deformities.  Skin:    General: Skin is warm and dry.  Neurological:     Mental Status: She is alert and oriented to person, place, and time.  Psychiatric:        Mood and Affect: Mood normal.        Behavior: Behavior normal.        Thought Content: Thought content normal.        Judgment: Judgment normal.     ____________________________________________    LABS  (all labs ordered are listed, but only abnormal results are displayed)  Labs Reviewed  CBG MONITORING, ED  CBG MONITORING, ED     ____________________________________________   EKG Not applicable.   ____________________________________________    RADIOLOGY I personally viewed and evaluated these images as part of my medical decision making, as well as reviewing the written report by the radiologist.  ED Provider Interpretation: I agree with the interpretation of the radiologist.  CT HEAD WO CONTRAST (5MM)  Result Date: 04/21/2021 CLINICAL DATA:  Head trauma, moderate-severe; Neck trauma, intoxicated or obtunded (Age >= 16y) EXAM: CT HEAD WITHOUT CONTRAST CT CERVICAL SPINE WITHOUT CONTRAST TECHNIQUE: Multidetector CT imaging of the head and cervical spine was performed following the standard protocol without intravenous contrast. Multiplanar CT image reconstructions  of the cervical spine  were also generated. COMPARISON:  MRI head April 05, 2021.  CT head 01/28/2011. FINDINGS: CT HEAD FINDINGS Brain: No evidence of acute large vascular territory infarct, acute hemorrhage, abnormal mass effect, or extra-axial fluid collection. 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 Vascular: Calcific intracranial atherosclerosis. No hyperdense vessel identified. Skull: No acute fracture. Sinuses/Orbits: Clear sinuses.  Unremarkable orbits. Other: No mastoid effusions. CT CERVICAL SPINE FINDINGS Alignment: Straightening of the normal cervical lordosis. No substantial sagittal subluxation. Skull base and vertebrae: C3-T1 corpectomy and anterior plate and screw fusion. Solid osseous fusion across the disc spaces spanning C3-C6. No evidence of osseous fusion across the C7-T1 disc space. No evidence of hardware fracture. Soft tissues and spinal canal: No prevertebral fluid or swelling. No visible canal hematoma. Disc levels: C7-T1 degenerative disc disease with disc height loss, endplate sclerosis. Multilevel facet/uncovertebral hypertrophy with varying degrees of neural foraminal stenosis. Diffuse osteopenia. Upper chest: Multiple 2-3 mm pulmonary nodules in bilateral upper lobes. Other: Subcentimeter right thyroid nodule, which does not require further imaging follow-up (ref: J Am Coll Radiol. 2015 Feb;12(2): 143-50). IMPRESSION: CT head: 1. No evidence of acute intracranial abnormality. 2. 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 CT cervical spine: 1. No evidence of acute fracture or traumatic malalignment. 2. C3-T1 corpectomy and anterior fusion. Solid osseous fusion across the C3-C7 disc spaces without evidence of fusion across the C7-T1 disc space. 3. Multiple 2-3 mm pulmonary nodules in bilateral upper lobes. No follow-up needed if patient is low-risk (and has no known or suspected primary  neoplasm). Non-contrast chest CT can be considered in 12 months if patient is high-risk. This recommendation follows the consensus statement: Guidelines for Management of Incidental Pulmonary Nodules Detected on CT Images: From the Fleischner Society 2017; Radiology 2017; 284:228-243. Electronically Signed   By: Margaretha Sheffield M.D.   On: 04/21/2021 18:36   CT Cervical Spine Wo Contrast  Result Date: 04/21/2021 CLINICAL DATA:  Head trauma, moderate-severe; Neck trauma, intoxicated or obtunded (Age >= 16y) EXAM: CT HEAD WITHOUT CONTRAST CT CERVICAL SPINE WITHOUT CONTRAST TECHNIQUE: Multidetector CT imaging of the head and cervical spine was performed following the standard protocol without intravenous contrast. Multiplanar CT image reconstructions of the cervical spine were also generated. COMPARISON:  MRI head April 05, 2021.  CT head 01/28/2011. FINDINGS: CT HEAD FINDINGS Brain: No evidence of acute large vascular territory infarct, acute hemorrhage, abnormal mass effect, or extra-axial fluid collection. 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 Vascular: Calcific intracranial atherosclerosis. No hyperdense vessel identified. Skull: No acute fracture. Sinuses/Orbits: Clear sinuses.  Unremarkable orbits. Other: No mastoid effusions. CT CERVICAL SPINE FINDINGS Alignment: Straightening of the normal cervical lordosis. No substantial sagittal subluxation. Skull base and vertebrae: C3-T1 corpectomy and anterior plate and screw fusion. Solid osseous fusion across the disc spaces spanning C3-C6. No evidence of osseous fusion across the C7-T1 disc space. No evidence of hardware fracture. Soft tissues and spinal canal: No prevertebral fluid or swelling. No visible canal hematoma. Disc levels: C7-T1 degenerative disc disease with disc height loss, endplate sclerosis. Multilevel facet/uncovertebral hypertrophy with varying degrees of neural foraminal  stenosis. Diffuse osteopenia. Upper chest: Multiple 2-3 mm pulmonary nodules in bilateral upper lobes. Other: Subcentimeter right thyroid nodule, which does not require further imaging follow-up (ref: J Am Coll Radiol. 2015 Feb;12(2): 143-50). IMPRESSION: CT head: 1. No evidence of  acute intracranial abnormality. 2. 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 CT cervical spine: 1. No evidence of acute fracture or traumatic malalignment. 2. C3-T1 corpectomy and anterior fusion. Solid osseous fusion across the C3-C7 disc spaces without evidence of fusion across the C7-T1 disc space. 3. Multiple 2-3 mm pulmonary nodules in bilateral upper lobes. No follow-up needed if patient is low-risk (and has no known or suspected primary neoplasm). Non-contrast chest CT can be considered in 12 months if patient is high-risk. This recommendation follows the consensus statement: Guidelines for Management of Incidental Pulmonary Nodules Detected on CT Images: From the Fleischner Society 2017; Radiology 2017; 284:228-243. Electronically Signed   By: Margaretha Sheffield M.D.   On: 04/21/2021 18:36   DG Knee Complete 4 Views Left  Result Date: 04/21/2021 CLINICAL DATA:  Knee pain, fall. EXAM: LEFT KNEE - COMPLETE 4+ VIEW COMPARISON:  None. FINDINGS: Status post left knee arthroplasty. No perihardware loosening or fracture. No appreciable joint effusion. IMPRESSION: Status post left knee arthroplasty without evidence of acute osseous abnormality. Electronically Signed   By: Keane Police D.O.   On: 04/21/2021 18:02    ____________________________________________   PROCEDURES  Procedures   Medications  ibuprofen (ADVIL) tablet 600 mg (600 mg Oral Given 04/21/21 2245)  acetaminophen (TYLENOL) tablet 650 mg (650 mg Oral Given 04/21/21 2245)    Critical Care performed: No  ____________________________________________   INITIAL IMPRESSION / ASSESSMENT AND PLAN / ED  COURSE  Pertinent labs & imaging results that were available during my care of the patient were reviewed by me and considered in my medical decision making (see chart for details).      Rhonda Tyler is a 80 y.o. female, history of hypertension, MI, GERD, diabetes, presents to the emergency department for evaluation of left knee pain.  Patient states that she was getting out of her car when she felt her left knee give out.  She fell to the ground on her back.  She states that she did hit her head.  She states that she currently feels well, however her left knee hurts significantly.  Denies fever/chills, LOC, abdominal pain, chest pain, SOB, or neck pain  Upon entering the room, patient appears well.  She is sitting upright comfortably in bed.  NAD.  Physical exam notable for swelling in the left knee, no erythema, tenderness along the anterior and posterior aspect of the knee.  Pain elicited with passive range of motion.  Pulse, motor, sensation intact.  Head CT negative for acute intracranial process.  Cervical spine CT negative as well.  Left knee x-ray showed no acute fractures or dislocations.   Given the patient's history, physical, and imaging, I suspect this patient likely experienced a knee sprain.  No life-threatening pathology suspected at this time.  Patient was placed in a knee immobilizer and instructed to follow-up with her primary care provider or orthopedic for follow-up if pain continues beyond 3 days.  Advised the patient to take Tylenol/ibuprofen as needed for pain.  Patient was provided with anticipatory guidance and return precautions.     ____________________________________________   FINAL CLINICAL IMPRESSION(S) / ED DIAGNOSES  Final diagnoses:  Fall, initial encounter  Knee injury, initial encounter     NEW MEDICATIONS STARTED DURING THIS VISIT:  ED Discharge Orders     None        Note:  This document was prepared using Dragon voice recognition software  and may include unintentional dictation  errors.    Teodoro Spray, [place] 04/21/21 0104    Nance Pear, MD 04/21/21 [phone]

## 2021-05-02 ENCOUNTER — Other Ambulatory Visit
Admission: RE | Admit: 2021-05-02 | Discharge: 2021-05-02 | Disposition: A | Discharge status: Home / Self Care | Payer: Medicare Other | Source: Ambulatory Visit | Attending: Orthopedic Surgery | Admitting: Orthopedic Surgery

## 2021-05-02 ENCOUNTER — Other Ambulatory Visit: Payer: Self-pay

## 2021-05-02 DIAGNOSIS — Z01812 Encounter for preprocedural laboratory examination: Secondary | ICD-10-CM

## 2021-05-02 HISTORY — DX: Headache, unspecified: R51.9

## 2021-05-02 HISTORY — DX: Pneumonia, unspecified organism: J18.9

## 2021-05-02 HISTORY — DX: Chronic kidney disease, unspecified: N18.9

## 2021-05-02 NOTE — Patient Instructions (Addendum)
Your procedure is scheduled on: 05/07/21 - Wednesday Report to the Registration Desk on the 1st floor of the [facility]. To find out your arrival time, please call [phone] between 1PM - 3PM on: 05/06/21 - Tuesday   REMEMBER: Instructions that are not followed completely may result in serious medical risk, up to and including death; or upon the discretion of your surgeon and anesthesiologist your surgery may need to be rescheduled.  Do not eat food after midnight the night before surgery.  No gum chewing, lozengers or hard candies.  You may however, drink CLEAR liquids up to 2 hours before you are scheduled to arrive for your surgery. Do not drink anything within 2 hours of your scheduled arrival time.  Clear liquids include: - water   TAKE THESE MEDICATIONS THE MORNING OF SURGERY WITH A SIP OF WATER:  - fenofibrate 160 MG tablet - hydroxychloroquine (PLAQUENIL) 200 MG tablet - hydrOXYzine (ATARAX/VISTARIL) 25 MG tablet - propranolol (INDERAL) 40 MG tablet - pantoprazole (PROTONIX) 40 MG tablet, (take one the night before and one on the morning of surgery - helps to prevent nausea after surgery.)   One week prior to surgery: Stop Anti-inflammatories (NSAIDS) such as Advil, Aleve, Ibuprofen, Motrin, Naproxen, Naprosyn and Aspirin based products such as Excedrin, Goodys Powder, BC Powder.  Stop ANY OVER THE COUNTER supplements until after surgery.  You may however, continue to take Tylenol if needed for pain up until the day of surgery.  No Alcohol for 24 hours before or after surgery.  No Smoking including e-cigarettes for 24 hours prior to surgery.  No chewable tobacco products for at least 6 hours prior to surgery.  No nicotine patches on the day of surgery.  Do not use any "recreational" drugs for at least a week prior to your surgery.  Please be advised that the combination of cocaine and anesthesia may have negative outcomes, up to and including death. If you test  positive for cocaine, your surgery will be cancelled.  On the morning of surgery brush your teeth with toothpaste and water, you may rinse your mouth with mouthwash if you wish. Do not swallow any toothpaste or mouthwash.  Use CHG Soap or wipes as directed on instruction sheet.  Do not wear jewelry, make-up, hairpins, clips or nail polish.  Do not wear lotions, powders, or perfumes.   Do not shave body from the neck down 48 hours prior to surgery just in case you cut yourself which could leave a site for infection.  Also, freshly shaved skin may become irritated if using the CHG soap.  Contact lenses, hearing aids and dentures may not be worn into surgery.  Do not bring valuables to the hospital. [facility] is not responsible for any missing/lost belongings or valuables.   Notify your doctor if there is any change in your medical condition (cold, fever, infection).  Wear comfortable clothing (specific to your surgery type) to the hospital.  After surgery, you can help prevent lung complications by doing breathing exercises.  Take deep breaths and cough every 1-2 hours. Your doctor may order a device called an Incentive Spirometer to help you take deep breaths. When coughing or sneezing, hold a pillow firmly against your incision with both hands. This is called splinting. Doing this helps protect your incision. It also decreases belly discomfort.  If you are being admitted to the hospital overnight, leave your suitcase in the car. After surgery it may be brought to your room.  If you  are being discharged the day of surgery, you will not be allowed to drive home. You will need a responsible adult (18 years or older) to drive you home and stay with you that night.   If you are taking public transportation, you will need to have a responsible adult (18 years or older) with you. Please confirm with your physician that it is acceptable to use public transportation.   Please call the  [facility] at [phone] if you have any questions about these instructions.  Surgery Visitation Policy:  Patients undergoing a surgery or procedure may have one family member or support person with them as long as that person is not COVID-19 positive or experiencing its symptoms.  That person may remain in the waiting area during the procedure and may rotate out with other people.  Inpatient Visitation:    Visiting hours are 7 a.m. to 8 p.m. Up to two visitors ages 16+ are allowed at one time in a patient room. The visitors may rotate out with other people during the day. Visitors must check out when they leave, or other visitors will not be allowed. One designated support person may remain overnight. The visitor must pass COVID-19 screenings, use hand sanitizer when entering and exiting the patients room and wear a mask at all times, including in the patients room. Patients must also wear a mask when staff or their visitor are in the room. Masking is required regardless of vaccination status.

## 2021-05-05 DIAGNOSIS — M9712XA Periprosthetic fracture around internal prosthetic left knee joint, initial encounter: Secondary | ICD-10-CM | POA: Insufficient documentation

## 2021-05-06 NOTE — Discharge Instructions (Signed)
Instructions after Total Knee Replacement   Deshawnda Acrey P. Luree Palla, Jr., M.D.     Dept. of Orthopaedics & Sports Medicine  [facility]  [address]  [place], [place]  [address]  Phone: [phone]   Fax: [phone]    DIET: Drink plenty of non-alcoholic fluids. Resume your normal diet. Include foods high in fiber.  ACTIVITY:  You may use crutches or a walker with weight-bearing as tolerated, unless instructed otherwise. You may be weaned off of the walker or crutches by your Physical Therapist.  Do NOT place pillows under the knee. Anything placed under the knee could limit your ability to straighten the knee.   Continue doing gentle exercises. Exercising will reduce the pain and swelling, increase motion, and prevent muscle weakness.   Please continue to use the TED compression stockings for 6 weeks. You may remove the stockings at night, but should reapply them in the morning. Do not drive or operate any equipment until instructed.  WOUND CARE:  Continue to use the PolarCare or ice packs periodically to reduce pain and swelling. You may bathe or shower after the staples are removed at the first office visit following surgery.  MEDICATIONS: You may resume your regular medications. Please take the pain medication as prescribed on the medication. Do not take pain medication on an empty stomach. You have been given a prescription for a blood thinner (Lovenox or Coumadin). Please take the medication as instructed. (NOTE: After completing a 2 week course of Lovenox, take one Enteric-coated aspirin once a day. This along with elevation will help reduce the possibility of phlebitis in your operated leg.) Do not drive or drink alcoholic beverages when taking pain medications.  CALL THE OFFICE FOR: Temperature above 101 degrees Excessive bleeding or drainage on the dressing. Excessive swelling, coldness, or paleness of the toes. Persistent nausea and vomiting.  FOLLOW-UP:  You  should have an appointment to return to the office in 10-14 days after surgery. Arrangements have been made for continuation of Physical Therapy (either home therapy or outpatient therapy).   [facility] Department Directory         [email or web address]       [email or web address]          Cardiology  Appointments: [place] - [phone] [place] - [phone]  Endocrinology  Appointments: [place] - [phone] [place] - [phone]  Gastroenterology  Appointments: [place] - [phone] [place] - [phone]        General Surgery   Appointments: [place] - [phone]  Internal Medicine/Family Medicine  Appointments: [place] - [phone] [place] - [phone] [place] - [phone]  Metabolic and Weigh Loss Surgery  Appointments: [place] - [phone]        Neurology  Appointments: [place] - [phone] [place] - [phone]  Neurosurgery  Appointments: [place] - [phone]  Obstetrics & Gynecology  Appointments: [place] - [phone] [place] - [phone]        Pediatrics  Appointments: [place] - [phone] [place] - [phone]  Physiatry  Appointments: [employer] [facility] -[phone]  Physical Therapy  Appointments: [place] - [phone] [place] - [phone]        Podiatry  Appointments: [place] - [phone] [place] - [phone]  Pulmonology  Appointments: [place] - [phone]  Rheumatology  Appointments: [place] - [phone]        [place] Location: [facility]  [address] , [place]  [address]  [place] Location: [facility] [address] [place], [place]  [address]  [place] Location: [facility] [address] [place], [place]  [address]    

## 2021-05-06 NOTE — H&P (Signed)
ORTHOPAEDIC HISTORY & PHYSICAL Ruthell Feigenbaum, Florinda Marker., MD - 05/01/2021 3:15 PM EST Formatting of this note is different from the original. Images from the original note were not included. Chief Complaint: Chief Complaint  Patient presents with   Supracondylar periprosthetic left knee fracture 04/22/21   Reason for Visit: The patient is a 80 y.o. female who presents today with her son for reevaluation of her left knee. She sustained a fall on 04/22/2021 while attempting to get into the car. She was evaluated at [facility] and fitted with a knee immobilizer. She was actually told that there was no evidence of a fracture. She has remained nonweightbearing to the left lower extremity since that time. Subsequent evaluation and radiographs demonstrated a displaced low supracondylar periprosthetic distal femur fracture. She reports severe left knee pain. She reports significant swelling of the knee. The pain is aggravated by any movement. She is using a wheelchair for ambulation.  Medications: Current Outpatient Medications  Medication Sig Dispense Refill   acetaminophen (TYLENOL) 500 mg capsule Take 1 capsule (500 mg total) by mouth every 4 (four) hours as needed for Fever   aspirin 81 MG EC tablet Take 1 tablet (81 mg total) by mouth once daily   blood glucose diagnostic (ONETOUCH ULTRA BLUE TEST STRIP) test strip 3 (three) times daily Dx e11.65 300 each 3   CYANOCOBALAMIN, VITAMIN B-12, (VITAMIN B-12 ORAL) Take 2,500 mcg by mouth once daily   diclofenac sodium 1 % Kit Apply 1 Application topically 4 (four) times daily as needed (shoulder)   fenofibrate 160 MG tablet Take 1 tablet by mouth once daily 90 tablet 0   ferrous fumarate-vitamin C (VITRON-C) 65 mg iron- 125 mg tablet Take 1 tablet by mouth once daily   FUROsemide (LASIX) 40 MG tablet Take 1 tablet (40 mg total) by mouth once daily 30 tablet 5   HYDROcodone-acetaminophen (NORCO) 5-325 mg tablet  Take 1-2 tablets by mouth every 6 (six) hours as needed for Pain As needed for pain. Approximately every 5 hours. 60 tablet 0   hydrOXYchloroQUINE (PLAQUENIL) 200 mg tablet Take 1 tablet (200 mg total) by mouth once daily   hydrOXYzine (ATARAX) 25 MG tablet Take 1 tablet (25 mg total) by mouth 3 (three) times daily as needed for Itching (for anxiety) 30 tablet 3   insulin NPH-REGULAR (HUMULIN 70/30) 100 unit/mL (70-30) injection Inject 25 Units subcutaneously every morning   insulin NPH-REGULAR (HUMULIN 70/30) 100 unit/mL (70-30) injection Inject 20 Units subcutaneously at bedtime   lisinopriL (ZESTRIL) 40 MG tablet Take 40 mg by mouth once daily   loratadine (CLARITIN) 10 mg capsule Take 1 capsule (10 mg total) by mouth once daily   magnesium oxide (MAG-OX) 400 mg (241.3 mg magnesium) tablet Take 400 mg by mouth once daily   meclizine (ANTIVERT) 25 MG tablet Take 25 mg by mouth 4 (four) times daily as needed   multivitamin capsule Take 1 capsule by mouth 2 (two) times daily   nitroGLYcerin (NITROSTAT) 0.4 MG SL tablet DISSOLVE ONE TABLET UNDER THE TONGUE EVERY 5 MINUTES AS NEEDED FOR CHEST PAIN. DO NOT EXCEED A TOTAL OF 3 DOSES IN 15 MINUTES 25 tablet 0   NOVOLIN 70/30 100 unit/mL (70-30) injection INJECT 45 UNITS SUBCUTANEOUSLY IN THE MORNING AND 40 UNITS AT NIGHT (Patient taking differently: 2 (two) times daily before meals Inject 25 units subcutaneously in the morning and 20 units at night.) 30 mL 11   NOVOLIN R injection (concentration 100 units/mL)  INJECT 4 TO 10 UNITS BEFORE LUNCH AS DIRECTED (Patient taking differently: Inject subcutaneously daily before lunch Inject 1 to 10 units before lunch as directed) 10 mL 1   ondansetron (ZOFRAN) 4 MG tablet Take 1 tablet (4 mg total) by mouth every 8 (eight) hours as needed for Nausea 30 tablet 1   pantoprazole (PROTONIX) 40 MG DR tablet Take 1 tablet (40 mg total) by mouth 2 (two) times daily 180 tablet 3   propranoloL (INDERAL LA) 120 MG 24 hr  capsule Take 1 capsule (120 mg total) by mouth once daily 30 capsule 11   rifAMPin (RIFADIN) 150 MG capsule Take 150 mg by mouth once daily   spironolactone (ALDACTONE) 50 MG tablet Take 1 tablet (50 mg total) by mouth once daily 90 tablet 5   triamcinolone 0.1 % cream Apply 1 Application topically 2 (two) times daily as needed   No current facility-administered medications for this visit.   Allergies: Allergies  Allergen Reactions   Levaquin [Levofloxacin] Shortness Of Breath   Codeine Hallucination   Penicillins Hives   Procardia [Nifedipine] Other (See Comments)  Speed up heart rate   Tramadol Other (See Comments)  confusion   Allegra [Fexofenadine] Rash   Lipitor [Atorvastatin] Muscle Pain   Naltrexone Analogues Nausea and Headache  Body flashes.   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)   Cervical disc disease   Chest pain   Cholecystolithiasis   Chronic kidney disease   Colon polyp   Coronary artery disease   Diabetes mellitus type 2, uncomplicated (CMS-HCC)   Diabetic neuropathy (CMS-HCC)   Foot fracture   GERD (gastroesophageal reflux disease)   Heart attack (CMS-HCC)   Heart murmur, unspecified   Hemorrhoids   History of cataract   Hyperlipidemia   Hypertension   IBS (irritable bowel syndrome)   Reactive airway disease   Sleep apnea   Past Surgical History: Past Surgical History:  Procedure Laterality Date   CORONARY ARTERY BYPASS GRAFT 1993   HYSTERECTOMY SUPRACERVICAL ABDOMINAL W/REMOVAL TUBES &/OR Simms   Foot fracture repair 1999   VESICULAR VAGINAL FISTULA 2002   BACK SURGERY 2002  Neck Surgery- Dr Suan Halter   ENDOSCOPIC CARPAL TUNNEL RELEASE 2004   COLONOSCOPY 03/15/2003   EXCISION OF MASS 03/05/2004   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   COLONOSCOPY 01/04/2008  To repeat 01/04/2008...AW, CMA**   ARTHROSCOPIC ROTATOR CUFF REPAIR 07/18/2010   COLONOSCOPY 06/01/2012  Within normal limits..Repeat 10 years/[place]..AW, CMA**   EGD 06/01/2012  Multiple gastric polyps..No Repeat/[place]..AW, CMA**   COLONOSCOPY 11/29/2017  Normal colon/Repeat 32yr/MUS   EGD 11/29/2017  Hyperplastic gastric polyps/Repeat 6 to 8 weeks/MUS   EGD 03/07/2018  Gastric polyps/Repeat 180yrUS   EGD 08/16/2019  Multiple gastric polyps/No Repeat/TKT   CARDIAC CATHETERIZATION   CHOLECYSTECTOMY   CORONARY ANGIOPLASTY   FRACTURE SURGERY   HYSTERECTOMY   JOINT REPLACEMENT   PTCA and stent of the RCA   Social History: Social History   Socioeconomic History   Marital status: Widowed   Number of children: 2   Years of education: 12   Highest education level: High school graduate  Tobacco Use   Smoking status: Former  Types:  Cigarettes  Quit date: 12/01/1970  Years since quitting: 50.4   Smokeless tobacco: Never  Vaping Use   Vaping Use: Never used  Substance and Sexual Activity   Alcohol use: No   Drug use: No   Sexual activity: Never   Family History: Family History  Problem Relation Age of Onset   Myocardial Infarction (Heart attack) Mother   High blood pressure (Hypertension) Mother   Skin cancer Mother   Myocardial Infarction (Heart attack) Father   High blood pressure (Hypertension) Father   Skin cancer Father   Myocardial Infarction (Heart attack) Other  sibling   Tuberculosis Other   Arthritis Other   Review of Systems: A comprehensive 14 point ROS was performed, reviewed, and the pertinent orthopaedic findings are documented in the HPI.  Exam BP 132/78   Ht 160 cm (5' 3" )   Wt 76.2 kg (168 lb)   LMP (LMP Unknown)   BMI 29.76 kg/m   General:  Well-developed, well-nourished  female seen in no acute distress.  Gait was not assessed since the patient presented in a wheelchair.   HEENT:  Atraumatic, normocephalic. Pupils are equal and reactive to light. Extraocular motion is intact. Sclera are clear. Oropharynx is clear with moist mucosa.  Neck:  Supple, nontender, and with good ROM. No thyromegaly, adenopathy, JVD, or carotid bruits.  Lungs:  Clear to auscultation bilaterally.  Cardiovascular:  Regular rate and rhythm. Normal S1, S2. No murmur . No appreciable gallops or rubs. Peripheral pulses are difficult to palpable. Homan`s test is negative.  Abdomen:  Soft, nontender, nondistended. Bowel sounds are present.  Extremities: Good strength, stability, and range of motion of the upper extremities. Good range of motion of the hips and ankles.  Left Knee: Soft tissue swelling: severe Effusion: moderate Erythema: none Crepitance: severe Tenderness: global Mediolateral laxity: gross instability Patellar tracking: Not assessed Atrophy: Generalized quadriceps atrophy.  Quadriceps tone was fair. Range of motion: Not assessed secondary to the injury  Neurologic:  Awake, alert, and oriented.  Sensory function is intact to pinprick and light touch.  Motor strength is judged to be 5/5.  Motor coordination is within normal limits.  No apparent clonus. No tremor.   X-rays: I reviewed the left knee radiographs that were performed at [facility] on 04/29/2021. Total knee implants are noted. There is a low supracondylar periprosthetic distal femur fracture with obvious displacement. Diffuse osteopenia is noted.  Impression: Left periprosthetic distal femur fracture  Plan:  The findings were discussed in detail with the patient and her family. I also reviewed the hospital radiographs. Ttreatment options were reviewed with the patient. Although immobilization of the knee is an option, I do not belief such treatment would allow her to return to her  pre-injury functional level. Given her medical co-morbidities, the patient is at risk for substantial perioperative complications. We discussed the risks and benefits of surgical intervention. The usual perioperative course was also discussed in detail. I would anticipate total knee revision arthroplasty utilizing a rotating hinge prosthesis. The patient expressed understanding of the risks and benefits of surgical intervention and would like to proceed with plans for surgical intervention.  Dr. Ubaldo Glassing (Cardiology) has stated that the patient is moderate risk based on her comorbid conditions but appears optimized. Dr. Frazier Richards (Medicine) evaluated the patient earlier this week and obtained labs.  I spent a total of 50 minutes in both face-to-face and non-face-to-face activities, excluding procedures performed, for this visit on the date of this encounter.   MEDICAL  CLEARANCE: Per anesthesiology. ACTIVITY: As tolerated. WORK STATUS: Not applicable. THERAPY: None MEDICATIONS: Requested Prescriptions   No prescriptions requested or ordered in this encounter   FOLLOW-UP: Return for postoperative follow-up.  Jessenia Filippone P. Holley Bouche., M.D.  This note was generated in part with voice recognition software and I apologize for any typographical errors that were not detected and corrected.  Electronically signed by Lamar Benes., MD at 05/05/2021 3:54 PM EST

## 2021-05-07 ENCOUNTER — Encounter: Payer: Self-pay | Admitting: Orthopedic Surgery

## 2021-05-07 ENCOUNTER — Inpatient Hospital Stay: Payer: Medicare Other | Admitting: Urgent Care

## 2021-05-07 ENCOUNTER — Encounter
Admission: RE | Disposition: A | Discharge status: Skilled Nursing Facility | Payer: Self-pay | Source: Home / Self Care | Attending: Orthopedic Surgery

## 2021-05-07 ENCOUNTER — Inpatient Hospital Stay: Payer: Medicare Other | Admitting: Certified Registered Nurse Anesthetist

## 2021-05-07 ENCOUNTER — Inpatient Hospital Stay
Admission: RE | Admit: 2021-05-07 | Discharge: 2021-05-13 | DRG: 467 | Disposition: A | Discharge status: Skilled Nursing Facility | Payer: Medicare Other | Attending: Orthopedic Surgery | Admitting: Orthopedic Surgery

## 2021-05-07 ENCOUNTER — Inpatient Hospital Stay: Payer: Medicare Other

## 2021-05-07 ENCOUNTER — Other Ambulatory Visit: Payer: Self-pay

## 2021-05-07 DIAGNOSIS — K219 Gastro-esophageal reflux disease without esophagitis: Secondary | ICD-10-CM | POA: Diagnosis present

## 2021-05-07 DIAGNOSIS — D509 Iron deficiency anemia, unspecified: Secondary | ICD-10-CM | POA: Diagnosis present

## 2021-05-07 DIAGNOSIS — M810 Age-related osteoporosis without current pathological fracture: Secondary | ICD-10-CM | POA: Diagnosis present

## 2021-05-07 DIAGNOSIS — D863 Sarcoidosis of skin: Secondary | ICD-10-CM | POA: Diagnosis present

## 2021-05-07 DIAGNOSIS — M9712XA Periprosthetic fracture around internal prosthetic left knee joint, initial encounter: Secondary | ICD-10-CM | POA: Diagnosis present

## 2021-05-07 DIAGNOSIS — Z79899 Other long term (current) drug therapy: Secondary | ICD-10-CM | POA: Diagnosis not present

## 2021-05-07 DIAGNOSIS — Z20822 Contact with and (suspected) exposure to covid-19: Secondary | ICD-10-CM | POA: Diagnosis present

## 2021-05-07 DIAGNOSIS — Z951 Presence of aortocoronary bypass graft: Secondary | ICD-10-CM

## 2021-05-07 DIAGNOSIS — Z881 Allergy status to other antibiotic agents status: Secondary | ICD-10-CM | POA: Diagnosis not present

## 2021-05-07 DIAGNOSIS — Z882 Allergy status to sulfonamides status: Secondary | ICD-10-CM

## 2021-05-07 DIAGNOSIS — Z794 Long term (current) use of insulin: Secondary | ICD-10-CM

## 2021-05-07 DIAGNOSIS — S72432A Displaced fracture of medial condyle of left femur, initial encounter for closed fracture: Secondary | ICD-10-CM | POA: Diagnosis present

## 2021-05-07 DIAGNOSIS — E1140 Type 2 diabetes mellitus with diabetic neuropathy, unspecified: Secondary | ICD-10-CM | POA: Diagnosis present

## 2021-05-07 DIAGNOSIS — I10 Essential (primary) hypertension: Secondary | ICD-10-CM | POA: Diagnosis present

## 2021-05-07 DIAGNOSIS — Z8249 Family history of ischemic heart disease and other diseases of the circulatory system: Secondary | ICD-10-CM

## 2021-05-07 DIAGNOSIS — I252 Old myocardial infarction: Secondary | ICD-10-CM

## 2021-05-07 DIAGNOSIS — D519 Vitamin B12 deficiency anemia, unspecified: Secondary | ICD-10-CM | POA: Diagnosis present

## 2021-05-07 DIAGNOSIS — Z86010 Personal history of colonic polyps: Secondary | ICD-10-CM

## 2021-05-07 DIAGNOSIS — Z87891 Personal history of nicotine dependence: Secondary | ICD-10-CM

## 2021-05-07 DIAGNOSIS — Z808 Family history of malignant neoplasm of other organs or systems: Secondary | ICD-10-CM

## 2021-05-07 DIAGNOSIS — Z90710 Acquired absence of both cervix and uterus: Secondary | ICD-10-CM

## 2021-05-07 DIAGNOSIS — E785 Hyperlipidemia, unspecified: Secondary | ICD-10-CM | POA: Diagnosis present

## 2021-05-07 DIAGNOSIS — D62 Acute posthemorrhagic anemia: Secondary | ICD-10-CM | POA: Diagnosis not present

## 2021-05-07 DIAGNOSIS — Z888 Allergy status to other drugs, medicaments and biological substances status: Secondary | ICD-10-CM

## 2021-05-07 DIAGNOSIS — Z7982 Long term (current) use of aspirin: Secondary | ICD-10-CM

## 2021-05-07 DIAGNOSIS — Z01812 Encounter for preprocedural laboratory examination: Secondary | ICD-10-CM

## 2021-05-07 DIAGNOSIS — R059 Cough, unspecified: Secondary | ICD-10-CM

## 2021-05-07 DIAGNOSIS — G4733 Obstructive sleep apnea (adult) (pediatric): Secondary | ICD-10-CM | POA: Diagnosis present

## 2021-05-07 DIAGNOSIS — Z880 Allergy status to penicillin: Secondary | ICD-10-CM

## 2021-05-07 DIAGNOSIS — Z8261 Family history of arthritis: Secondary | ICD-10-CM

## 2021-05-07 DIAGNOSIS — Z96659 Presence of unspecified artificial knee joint: Secondary | ICD-10-CM

## 2021-05-07 DIAGNOSIS — Z955 Presence of coronary angioplasty implant and graft: Secondary | ICD-10-CM

## 2021-05-07 DIAGNOSIS — I2510 Atherosclerotic heart disease of native coronary artery without angina pectoris: Secondary | ICD-10-CM | POA: Diagnosis present

## 2021-05-07 DIAGNOSIS — R000 Tachycardia, unspecified: Secondary | ICD-10-CM | POA: Diagnosis not present

## 2021-05-07 DIAGNOSIS — Z9049 Acquired absence of other specified parts of digestive tract: Secondary | ICD-10-CM

## 2021-05-07 DIAGNOSIS — Z01810 Encounter for preprocedural cardiovascular examination: Secondary | ICD-10-CM | POA: Diagnosis not present

## 2021-05-07 HISTORY — PX: TOTAL KNEE REVISION: SHX996

## 2021-05-07 LAB — GLUCOSE, CAPILLARY
Glucose-Capillary: 216 mg/dL — ABNORMAL HIGH (ref 70–99)
Glucose-Capillary: 216 mg/dL — ABNORMAL HIGH (ref 70–99)
Glucose-Capillary: 220 mg/dL — ABNORMAL HIGH (ref 70–99)

## 2021-05-07 LAB — GRAM STAIN

## 2021-05-07 LAB — ABO/RH: ABO/RH(D): A POS

## 2021-05-07 LAB — SARS CORONAVIRUS 2 BY RT PCR (HOSPITAL ORDER, PERFORMED IN ~~LOC~~ HOSPITAL LAB): SARS Coronavirus 2: NEGATIVE

## 2021-05-07 LAB — TYPE AND SCREEN
ABO/RH(D): A POS
Antibody Screen: NEGATIVE

## 2021-05-07 LAB — SURGICAL PCR SCREEN
MRSA, PCR: NEGATIVE
Staphylococcus aureus: NEGATIVE

## 2021-05-07 SURGERY — TOTAL KNEE REVISION
Anesthesia: General | Site: Knee | Laterality: Left

## 2021-05-07 MED ORDER — LIDOCAINE HCL (CARDIAC) PF 100 MG/5ML IV SOSY
PREFILLED_SYRINGE | INTRAVENOUS | Status: DC | PRN
  Administered 2021-05-07: 80 mg via INTRAVENOUS

## 2021-05-07 MED ORDER — ONDANSETRON HCL 4 MG/2ML IJ SOLN: 4.0000 mg | Freq: Once | INTRAMUSCULAR | Status: DC | PRN

## 2021-05-07 MED ORDER — DIPHENHYDRAMINE HCL 12.5 MG/5ML PO ELIX: 12.5000 mg | ORAL_SOLUTION | ORAL | Status: DC | PRN

## 2021-05-07 MED ORDER — OXYCODONE HCL 5 MG PO TABS
10.0000 mg | ORAL_TABLET | ORAL | Status: DC | PRN
  Administered 2021-05-09 – 2021-05-13 (×6): 10 mg via ORAL
  Filled 2021-05-07 (×8): qty 2

## 2021-05-07 MED ORDER — HYDROMORPHONE HCL 1 MG/ML IJ SOLN
INTRAMUSCULAR | Status: AC
  Filled 2021-05-07: qty 1

## 2021-05-07 MED ORDER — TRANEXAMIC ACID-NACL 1000-0.7 MG/100ML-% IV SOLN
1000.0000 mg | INTRAVENOUS | Status: AC
  Administered 2021-05-07: 12:00:00 1000 mg via INTRAVENOUS

## 2021-05-07 MED ORDER — HYDROMORPHONE HCL 1 MG/ML IJ SOLN
0.5000 mg | INTRAMUSCULAR | Status: DC | PRN
  Administered 2021-05-08 – 2021-05-12 (×4): 1 mg via INTRAVENOUS
  Administered 2021-05-13: 0.5 mg via INTRAVENOUS
  Filled 2021-05-07 (×5): qty 1

## 2021-05-07 MED ORDER — BISACODYL 10 MG RE SUPP
10.0000 mg | Freq: Every day | RECTAL | Status: DC | PRN
  Administered 2021-05-08: 09:00:00 10 mg via RECTAL
  Filled 2021-05-07: qty 1

## 2021-05-07 MED ORDER — ONDANSETRON HCL 4 MG/2ML IJ SOLN
4.0000 mg | Freq: Four times a day (QID) | INTRAMUSCULAR | Status: DC | PRN
  Administered 2021-05-08 – 2021-05-09 (×2): 4 mg via INTRAVENOUS
  Filled 2021-05-07 (×2): qty 2

## 2021-05-07 MED ORDER — INSULIN ASPART 100 UNIT/ML IJ SOLN
INTRAMUSCULAR | Status: AC
  Filled 2021-05-07: qty 1

## 2021-05-07 MED ORDER — ACETAMINOPHEN 10 MG/ML IV SOLN: 1000.0000 mg | Freq: Once | INTRAVENOUS | Status: DC | PRN

## 2021-05-07 MED ORDER — ROCURONIUM BROMIDE 100 MG/10ML IV SOLN
INTRAVENOUS | Status: DC | PRN
  Administered 2021-05-07: 70 mg via INTRAVENOUS
  Administered 2021-05-07: 10 mg via INTRAVENOUS

## 2021-05-07 MED ORDER — FENTANYL CITRATE (PF) 100 MCG/2ML IJ SOLN
INTRAMUSCULAR | Status: DC | PRN
  Administered 2021-05-07: 100 ug via INTRAVENOUS

## 2021-05-07 MED ORDER — CHLORHEXIDINE GLUCONATE 0.12 % MT SOLN
OROMUCOSAL | Status: AC
  Administered 2021-05-07: 10:00:00 15 mL via OROMUCOSAL
  Filled 2021-05-07: qty 15

## 2021-05-07 MED ORDER — SODIUM CHLORIDE 0.9 % IV SOLN: INTRAVENOUS | Status: DC

## 2021-05-07 MED ORDER — CEFAZOLIN SODIUM-DEXTROSE 2-4 GM/100ML-% IV SOLN
INTRAVENOUS | Status: AC
  Administered 2021-05-08: 2 g via INTRAVENOUS
  Filled 2021-05-07: qty 100

## 2021-05-07 MED ORDER — PANTOPRAZOLE SODIUM 40 MG PO TBEC
40.0000 mg | DELAYED_RELEASE_TABLET | Freq: Two times a day (BID) | ORAL | Status: DC
  Administered 2021-05-07 – 2021-05-13 (×12): 40 mg via ORAL
  Filled 2021-05-07 (×12): qty 1

## 2021-05-07 MED ORDER — INSULIN ASPART 100 UNIT/ML IJ SOLN
INTRAMUSCULAR | Status: DC | PRN
  Administered 2021-05-07: 5 [IU] via SUBCUTANEOUS

## 2021-05-07 MED ORDER — INSULIN ASPART 100 UNIT/ML IJ SOLN
0.0000 [IU] | Freq: Every day | INTRAMUSCULAR | Status: DC
  Administered 2021-05-07: 22:00:00 2 [IU] via SUBCUTANEOUS
  Administered 2021-05-09: 22:00:00 5 [IU] via SUBCUTANEOUS
  Administered 2021-05-10: 3 [IU] via SUBCUTANEOUS
  Filled 2021-05-07 (×3): qty 1

## 2021-05-07 MED ORDER — GABAPENTIN 300 MG PO CAPS: 300.0000 mg | ORAL_CAPSULE | Freq: Once | ORAL | Status: AC

## 2021-05-07 MED ORDER — TRANEXAMIC ACID-NACL 1000-0.7 MG/100ML-% IV SOLN
INTRAVENOUS | Status: AC
  Filled 2021-05-07: qty 100

## 2021-05-07 MED ORDER — SODIUM CHLORIDE (PF) 0.9 % IJ SOLN
INTRAMUSCULAR | Status: DC | PRN
  Administered 2021-05-07: 17:00:00 120 mL

## 2021-05-07 MED ORDER — DEXAMETHASONE SODIUM PHOSPHATE 10 MG/ML IJ SOLN
INTRAMUSCULAR | Status: AC
  Administered 2021-05-07: 11:00:00 8 mg via INTRAVENOUS
  Filled 2021-05-07: qty 1

## 2021-05-07 MED ORDER — CEFAZOLIN SODIUM-DEXTROSE 2-4 GM/100ML-% IV SOLN
2.0000 g | INTRAVENOUS | Status: AC
  Administered 2021-05-07 (×2): 2 g via INTRAVENOUS

## 2021-05-07 MED ORDER — HYDROMORPHONE HCL 1 MG/ML IJ SOLN
INTRAMUSCULAR | Status: DC | PRN
  Administered 2021-05-07 (×2): .25 mg via INTRAVENOUS

## 2021-05-07 MED ORDER — PHENYLEPHRINE HCL-NACL 20-0.9 MG/250ML-% IV SOLN
INTRAVENOUS | Status: DC | PRN
  Administered 2021-05-07: 30 ug/min via INTRAVENOUS

## 2021-05-07 MED ORDER — ALUM & MAG HYDROXIDE-SIMETH 200-200-20 MG/5ML PO SUSP: 30.0000 mL | ORAL | Status: DC | PRN

## 2021-05-07 MED ORDER — PHENYLEPHRINE HCL (PRESSORS) 10 MG/ML IV SOLN
INTRAVENOUS | Status: DC | PRN
  Administered 2021-05-07 (×2): 100 ug via INTRAVENOUS
  Administered 2021-05-07: 200 ug via INTRAVENOUS
  Administered 2021-05-07 (×2): 100 ug via INTRAVENOUS

## 2021-05-07 MED ORDER — CEFAZOLIN SODIUM-DEXTROSE 2-4 GM/100ML-% IV SOLN
2.0000 g | Freq: Four times a day (QID) | INTRAVENOUS | Status: AC
  Administered 2021-05-08: 05:00:00 2 g via INTRAVENOUS
  Filled 2021-05-07 (×2): qty 100

## 2021-05-07 MED ORDER — FLEET ENEMA 7-19 GM/118ML RE ENEM: 1.0000 | ENEMA | Freq: Once | RECTAL | Status: DC | PRN

## 2021-05-07 MED ORDER — ORAL CARE MOUTH RINSE: 15.0000 mL | Freq: Once | OROMUCOSAL | Status: AC

## 2021-05-07 MED ORDER — CHLORHEXIDINE GLUCONATE 0.12 % MT SOLN: 15.0000 mL | Freq: Once | OROMUCOSAL | Status: AC

## 2021-05-07 MED ORDER — 0.9 % SODIUM CHLORIDE (POUR BTL) OPTIME
TOPICAL | Status: DC | PRN
  Administered 2021-05-07: 14:00:00 1000 mL

## 2021-05-07 MED ORDER — ACETAMINOPHEN 10 MG/ML IV SOLN
1000.0000 mg | Freq: Four times a day (QID) | INTRAVENOUS | Status: AC
  Administered 2021-05-07 – 2021-05-08 (×4): 1000 mg via INTRAVENOUS
  Filled 2021-05-07 (×4): qty 100

## 2021-05-07 MED ORDER — CHLORHEXIDINE GLUCONATE 4 % EX LIQD: 60.0000 mL | Freq: Once | CUTANEOUS | Status: DC

## 2021-05-07 MED ORDER — SUGAMMADEX SODIUM 200 MG/2ML IV SOLN
INTRAVENOUS | Status: DC | PRN
  Administered 2021-05-07: 200 mg via INTRAVENOUS

## 2021-05-07 MED ORDER — SUCCINYLCHOLINE CHLORIDE 200 MG/10ML IV SOSY
PREFILLED_SYRINGE | INTRAVENOUS | Status: DC | PRN
  Administered 2021-05-07: 100 mg via INTRAVENOUS

## 2021-05-07 MED ORDER — SODIUM CHLORIDE FLUSH 0.9 % IV SOLN
INTRAVENOUS | Status: AC
  Filled 2021-05-07: qty 40

## 2021-05-07 MED ORDER — ACETAMINOPHEN 325 MG PO TABS: 325.0000 mg | ORAL_TABLET | Freq: Four times a day (QID) | ORAL | Status: DC | PRN

## 2021-05-07 MED ORDER — ACETAMINOPHEN 10 MG/ML IV SOLN
INTRAVENOUS | Status: AC
  Filled 2021-05-07: qty 100

## 2021-05-07 MED ORDER — OXYCODONE HCL 5 MG PO TABS
5.0000 mg | ORAL_TABLET | ORAL | Status: DC | PRN
  Administered 2021-05-08 – 2021-05-10 (×6): 5 mg via ORAL
  Filled 2021-05-07 (×5): qty 1

## 2021-05-07 MED ORDER — SENNOSIDES-DOCUSATE SODIUM 8.6-50 MG PO TABS
1.0000 | ORAL_TABLET | Freq: Two times a day (BID) | ORAL | Status: DC
  Administered 2021-05-07 – 2021-05-13 (×12): 1 via ORAL
  Filled 2021-05-07 (×12): qty 1

## 2021-05-07 MED ORDER — PHENYLEPHRINE HCL (PRESSORS) 10 MG/ML IV SOLN
INTRAVENOUS | Status: AC
  Filled 2021-05-07: qty 1

## 2021-05-07 MED ORDER — MENTHOL 3 MG MT LOZG
1.0000 | LOZENGE | OROMUCOSAL | Status: DC | PRN
  Filled 2021-05-07: qty 9

## 2021-05-07 MED ORDER — LIDOCAINE HCL (PF) 2 % IJ SOLN
INTRAMUSCULAR | Status: AC
  Filled 2021-05-07: qty 5

## 2021-05-07 MED ORDER — PRONTOSAN WOUND IRRIGATION OPTIME
TOPICAL | Status: DC | PRN
  Administered 2021-05-07: 1 via TOPICAL

## 2021-05-07 MED ORDER — MAGNESIUM HYDROXIDE 400 MG/5ML PO SUSP
30.0000 mL | Freq: Every day | ORAL | Status: DC
  Administered 2021-05-07 – 2021-05-13 (×7): 30 mL via ORAL
  Filled 2021-05-07 (×7): qty 30

## 2021-05-07 MED ORDER — FERROUS SULFATE 325 (65 FE) MG PO TABS
325.0000 mg | ORAL_TABLET | Freq: Two times a day (BID) | ORAL | Status: DC
  Administered 2021-05-08 – 2021-05-11 (×7): 325 mg via ORAL
  Filled 2021-05-07 (×7): qty 1

## 2021-05-07 MED ORDER — BUPIVACAINE-EPINEPHRINE (PF) 0.25% -1:200000 IJ SOLN
INTRAMUSCULAR | Status: AC
  Filled 2021-05-07: qty 60

## 2021-05-07 MED ORDER — INSULIN ASPART 100 UNIT/ML IJ SOLN
0.0000 [IU] | Freq: Three times a day (TID) | INTRAMUSCULAR | Status: DC
  Administered 2021-05-08 (×3): 5 [IU] via SUBCUTANEOUS
  Administered 2021-05-09: 17:00:00 11 [IU] via SUBCUTANEOUS
  Administered 2021-05-09 (×2): 15 [IU] via SUBCUTANEOUS
  Administered 2021-05-10 (×3): 8 [IU] via SUBCUTANEOUS
  Administered 2021-05-11: 11 [IU] via SUBCUTANEOUS
  Administered 2021-05-11: 5 [IU] via SUBCUTANEOUS
  Administered 2021-05-11 – 2021-05-12 (×2): 8 [IU] via SUBCUTANEOUS
  Administered 2021-05-12 (×2): 5 [IU] via SUBCUTANEOUS
  Administered 2021-05-13 (×2): 8 [IU] via SUBCUTANEOUS
  Filled 2021-05-07 (×17): qty 1

## 2021-05-07 MED ORDER — ONDANSETRON HCL 4 MG/2ML IJ SOLN
INTRAMUSCULAR | Status: DC | PRN
  Administered 2021-05-07: 4 mg via INTRAVENOUS

## 2021-05-07 MED ORDER — FENTANYL CITRATE (PF) 100 MCG/2ML IJ SOLN: 25.0000 ug | INTRAMUSCULAR | Status: DC | PRN

## 2021-05-07 MED ORDER — METOCLOPRAMIDE HCL 10 MG PO TABS
10.0000 mg | ORAL_TABLET | Freq: Three times a day (TID) | ORAL | Status: AC
  Administered 2021-05-07 – 2021-05-09 (×8): 10 mg via ORAL
  Filled 2021-05-07 (×8): qty 1

## 2021-05-07 MED ORDER — ENOXAPARIN SODIUM 30 MG/0.3ML IJ SOSY
30.0000 mg | PREFILLED_SYRINGE | INTRAMUSCULAR | Status: DC
  Administered 2021-05-08 – 2021-05-13 (×6): 30 mg via SUBCUTANEOUS
  Filled 2021-05-07 (×6): qty 0.3

## 2021-05-07 MED ORDER — TRANEXAMIC ACID-NACL 1000-0.7 MG/100ML-% IV SOLN
INTRAVENOUS | Status: AC
  Administered 2021-05-07: 18:00:00 1000 mg via INTRAVENOUS
  Filled 2021-05-07: qty 100

## 2021-05-07 MED ORDER — GABAPENTIN 300 MG PO CAPS
ORAL_CAPSULE | ORAL | Status: AC
  Administered 2021-05-07: 10:00:00 300 mg via ORAL
  Filled 2021-05-07: qty 1

## 2021-05-07 MED ORDER — PROPOFOL 10 MG/ML IV BOLUS
INTRAVENOUS | Status: DC | PRN
  Administered 2021-05-07: 60 mg via INTRAVENOUS
  Administered 2021-05-07: 20 mg via INTRAVENOUS

## 2021-05-07 MED ORDER — ONDANSETRON HCL 4 MG PO TABS
4.0000 mg | ORAL_TABLET | Freq: Four times a day (QID) | ORAL | Status: DC | PRN
  Administered 2021-05-13: 4 mg via ORAL
  Filled 2021-05-07: qty 1

## 2021-05-07 MED ORDER — DEXAMETHASONE SODIUM PHOSPHATE 10 MG/ML IJ SOLN: 8.0000 mg | Freq: Once | INTRAMUSCULAR | Status: AC

## 2021-05-07 MED ORDER — ACETAMINOPHEN 10 MG/ML IV SOLN
INTRAVENOUS | Status: DC | PRN
  Administered 2021-05-07: 1000 mg via INTRAVENOUS

## 2021-05-07 MED ORDER — PHENOL 1.4 % MT LIQD
1.0000 | OROMUCOSAL | Status: DC | PRN
  Filled 2021-05-07: qty 177

## 2021-05-07 MED ORDER — SODIUM CHLORIDE 0.9 % IR SOLN
Status: DC | PRN
  Administered 2021-05-07: 3000 mL

## 2021-05-07 MED ORDER — FENTANYL CITRATE (PF) 100 MCG/2ML IJ SOLN
INTRAMUSCULAR | Status: AC
  Filled 2021-05-07: qty 2

## 2021-05-07 MED ORDER — TRANEXAMIC ACID-NACL 1000-0.7 MG/100ML-% IV SOLN: 1000.0000 mg | Freq: Once | INTRAVENOUS | Status: AC

## 2021-05-07 SURGICAL SUPPLY — 93 items
BLADE OSCILLATING/SAGITTAL (BLADE) ×2
BLADE SAGITTAL 25.0X1.19X90 (BLADE) ×3 IMPLANT
BLADE SAW 70X12.5 (BLADE) ×2 IMPLANT
BLADE SAW 90X13X1.19 OSCILLAT (BLADE) ×2 IMPLANT
BLADE SAW 90X25X1.19 OSCILLAT (BLADE) ×3 IMPLANT
BLADE SAW SGTL 13X75X1.27 (BLADE) ×2 IMPLANT
BLADE SW THK.38XMED LNG THN (BLADE) ×2 IMPLANT
BONE CEMENT GENTAMICIN (Cement) ×4 IMPLANT
CEMENT BONE GENTAMICIN 40 (Cement) ×4 IMPLANT
CNTNR SPEC 2.5X3XGRAD LEK (MISCELLANEOUS)
CONT SPEC 4OZ STER OR WHT (MISCELLANEOUS)
CONT SPEC 4OZ STRL OR WHT (MISCELLANEOUS)
CONTAINER SPEC 2.5X3XGRAD LEK (MISCELLANEOUS) ×8 IMPLANT
COOLER POLAR GLACIER W/PUMP (MISCELLANEOUS) ×3 IMPLANT
COVER BACK TABLE REUSABLE LG (DRAPES) ×3 IMPLANT
CUFF TOURN SGL QUICK 24 (TOURNIQUET CUFF)
CUFF TOURN SGL QUICK 34 (TOURNIQUET CUFF)
CUFF TRNQT CYL 24X4X16.5-23 (TOURNIQUET CUFF) IMPLANT
CUFF TRNQT CYL 34X4.125X (TOURNIQUET CUFF) IMPLANT
DRAPE 3/4 80X56 (DRAPES) ×6 IMPLANT
DRSG DERMACEA 8X12 NADH (GAUZE/BANDAGES/DRESSINGS) ×3 IMPLANT
DRSG MEPILEX SACRM 8.7X9.8 (GAUZE/BANDAGES/DRESSINGS) ×3 IMPLANT
DRSG OPSITE POSTOP 4X14 (GAUZE/BANDAGES/DRESSINGS) ×3 IMPLANT
DRSG TEGADERM 4X4.75 (GAUZE/BANDAGES/DRESSINGS) ×3 IMPLANT
DURAPREP 26ML APPLICATOR (WOUND CARE) ×6 IMPLANT
ELECT CAUTERY BLADE 6.4 (BLADE) ×3 IMPLANT
ELECT REM PT RETURN 9FT ADLT (ELECTROSURGICAL) ×2
ELECTRODE REM PT RTRN 9FT ADLT (ELECTROSURGICAL) ×2 IMPLANT
GAUZE 4X4 16PLY ~~LOC~~+RFID DBL (SPONGE) ×2 IMPLANT
GAUZE SPONGE 4X4 12PLY STRL (GAUZE/BANDAGES/DRESSINGS) ×3 IMPLANT
GLOVE SRG 8 PF TXTR STRL LF DI (GLOVE) ×2 IMPLANT
GLOVE SURG ENC TEXT LTX SZ7.5 (GLOVE) ×15 IMPLANT
GLOVE SURG UNDER LTX SZ8 (GLOVE) ×3 IMPLANT
GLOVE SURG UNDER POLY LF SZ7.5 (GLOVE) ×3 IMPLANT
GLOVE SURG UNDER POLY LF SZ8 (GLOVE) ×2
GOWN STRL REUS W/ TWL LRG LVL3 (GOWN DISPOSABLE) ×4 IMPLANT
GOWN STRL REUS W/TWL LRG LVL3 (GOWN DISPOSABLE) ×4
HANDPIECE VERSAJET DEBRIDEMENT (MISCELLANEOUS) ×2 IMPLANT
HEMOVAC 400CC 10FR (MISCELLANEOUS) ×3 IMPLANT
HOLDER FOLEY CATH W/STRAP (MISCELLANEOUS) ×3 IMPLANT
INSERT KNEE REV TIBIAL SM 12MM (Joint) ×1 IMPLANT
INSERT SMALL LEFT SROM (Orthopedic Implant) IMPLANT
IV NS IRRIG 3000ML ARTHROMATIC (IV SOLUTION) ×3 IMPLANT
KIT TURNOVER KIT A (KITS) ×3 IMPLANT
KNIFE SCULPS 14X20 (INSTRUMENTS) ×3 IMPLANT
MANIFOLD NEPTUNE II (INSTRUMENTS) ×6 IMPLANT
NDL SAFETY ECLIPSE 18X1.5 (NEEDLE) ×2 IMPLANT
NDL SPNL 18GX3.5 QUINCKE PK (NEEDLE) ×2 IMPLANT
NDL SPNL 20GX3.5 QUINCKE YW (NEEDLE) ×4 IMPLANT
NEEDLE HYPO 18GX1.5 SHARP (NEEDLE) ×2
NEEDLE SPNL 18GX3.5 QUINCKE PK (NEEDLE) ×2 IMPLANT
NEEDLE SPNL 20GX3.5 QUINCKE YW (NEEDLE) ×4 IMPLANT
NS IRRIG 1000ML POUR BTL (IV SOLUTION) ×3 IMPLANT
OSTEOTOME THIN 10.0 1.5 (INSTRUMENTS) ×1 IMPLANT
OSTEOTOME THIN 6.0 1.5 (INSTRUMENTS) ×1 IMPLANT
PACK TOTAL KNEE (MISCELLANEOUS) ×3 IMPLANT
PAD ABD DERMACEA PRESS 5X9 (GAUZE/BANDAGES/DRESSINGS) ×3 IMPLANT
PAD WRAPON POLAR KNEE (MISCELLANEOUS) ×2 IMPLANT
PENCIL SMOKE EVACUATOR COATED (MISCELLANEOUS) ×3 IMPLANT
PIN FIXATION 1/8DIA X 3INL (PIN) ×1 IMPLANT
PULSAVAC PLUS IRRIG FAN TIP (DISPOSABLE) ×2
SLEEVE TIB MBT 53 (Knees) ×1 IMPLANT
SLEEVE UNIV FEM FUL PRO SZ46MM (Sleeve) ×1 IMPLANT
SOL PREP PVP 2OZ (MISCELLANEOUS) ×2
SOLUTION PREP PVP 2OZ (MISCELLANEOUS) ×2 IMPLANT
SOLUTION PRONTOSAN WOUND 350ML (IRRIGATION / IRRIGATOR) ×3 IMPLANT
SPONGE DRAIN TRACH 4X4 STRL 2S (GAUZE/BANDAGES/DRESSINGS) ×3 IMPLANT
SPONGE T-LAP 18X18 ~~LOC~~+RFID (SPONGE) ×13 IMPLANT
SROM INSERT SMALL LEFT (Orthopedic Implant) ×2 IMPLANT
STAPLER SKIN PROX 35W (STAPLE) ×3 IMPLANT
STEM UNIVERSAL REVISION 75X12 (Stem) ×1 IMPLANT
STEM UNIVERSAL REVISION 75X16 (Stem) ×1 IMPLANT
SUCTION FRAZIER HANDLE 10FR (MISCELLANEOUS) ×2
SUCTION TUBE FRAZIER 10FR DISP (MISCELLANEOUS) ×2 IMPLANT
SUT MNCRL 0 1X36 CT-1 (SUTURE) ×2 IMPLANT
SUT MON AB 2-0 CT1 36 (SUTURE) ×3 IMPLANT
SUT MONOCRYL 0 (SUTURE) ×2
SUT PROLENE 1 CT 1 30 (SUTURE) ×3 IMPLANT
SUT VIC AB 0 CT1 36 (SUTURE) ×3 IMPLANT
SUT VIC AB 1 CT1 36 (SUTURE) ×6 IMPLANT
SUT VIC AB 2-0 CT1 (SUTURE) ×3 IMPLANT
SWAB CULTURE AMIES ANAERIB BLU (MISCELLANEOUS) ×16 IMPLANT
SYR 20ML LL LF (SYRINGE) ×3 IMPLANT
SYR 30ML LL (SYRINGE) ×6 IMPLANT
TIP BRUSH PULSAVAC PLUS 24.33 (MISCELLANEOUS) ×3 IMPLANT
TIP FAN IRRIG PULSAVAC PLUS (DISPOSABLE) ×2 IMPLANT
TOWEL OR 17X26 4PK STRL BLUE (TOWEL DISPOSABLE) ×3 IMPLANT
TOWER CARTRIDGE SMART MIX (DISPOSABLE) ×3 IMPLANT
TRAY FOLEY MTR SLVR 16FR STAT (SET/KITS/TRAYS/PACK) ×3 IMPLANT
TRAY REVISION SZ 3 (Knees) ×1 IMPLANT
WATER STERILE IRR 1000ML POUR (IV SOLUTION) ×1 IMPLANT
WATER STERILE IRR 500ML POUR (IV SOLUTION) ×2 IMPLANT
WRAPON POLAR PAD KNEE (MISCELLANEOUS) ×2

## 2021-05-07 NOTE — H&P (Signed)
The patient has been re-examined, and the chart reviewed, and there have been no interval changes to the documented history and physical.    The risks, benefits, and alternatives have been discussed at length. The patient expressed understanding of the risks benefits and agreed with plans for surgical intervention.  Darin Redmann P. Raechal Raben, Jr. M.D.    

## 2021-05-07 NOTE — Progress Notes (Signed)
Patient awake, soft spoken, family updated regarding bed assignment. Left foot cool to touch, pulse +1. Polar care intact/charge. Noted from fall in 04/22/21, left breast bruised.h/o cirrhosis of liver, slightly jaundiced. No c/o's upon transfer to 132.

## 2021-05-07 NOTE — Anesthesia Preprocedure Evaluation (Addendum)
Anesthesia Evaluation  Patient identified by MRN, date of birth, ID band Patient awake    Reviewed: Allergy & Precautions, NPO status , Patient's Chart, lab work & pertinent test results  History of Anesthesia Complications Negative for: history of anesthetic complications  Airway Mallampati: II  TM Distance: >3 FB Neck ROM: Full    Dental  (+) Poor Dentition, Missing, Implants   Pulmonary sleep apnea , neg COPD, Patient abstained from smoking.Not current smoker, former smoker,    Pulmonary exam normal breath sounds clear to auscultation       Cardiovascular Exercise Tolerance: Poor METS: 3 - Mets hypertension, + CAD, + Past MI and + CABG  (-) dysrhythmias + Valvular Problems/Murmurs MR  Rhythm:Regular Rate:Normal - Systolic murmurs 09/6385 TTE: INTERPRETATION  NORMAL LEFT VENTRICULAR SYSTOLIC FUNCTION  WITH MILD LVH  NORMAL RIGHT VENTRICULAR SYSTOLIC FUNCTION  NO VALVULAR STENOSIS  MODERATE MR  MILD TR  EF >55%     Neuro/Psych  Headaches, negative psych ROS   GI/Hepatic neg GERD  ,(+) Cirrhosis   ascites  (-) substance abuse  , Hx ascites needing paracentesis last year. No hx varices or UGI bleed. 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 No date: Diabetes mellitus without complication (HCC)     Comment:  Pt takes insulin No date: Diabetic neuropathy (HCC) No  date: GERD (gastroesophageal reflux disease) No date: Headache     Comment:  migaines in the past No date: Hemorrhoid No date: History of diabetic neuropathy No date: Hypercalcemia No date: Hyperlipemia No date: Hypertension No date: IBS (irritable bowel syndrome) No date: Iron deficiency anemia No date: Myocardial infarction ([place]) No date: Pneumonia No date: Sleep apnea  Reproductive/Obstetrics                            Anesthesia Physical Anesthesia Plan  ASA: 3  Anesthesia Plan: General   Post-op Pain Management: Gabapentin PO (pre-op) and Ofirmev IV (intra-op)   Induction: Intravenous  PONV Risk Score and Plan: 4 or greater and Ondansetron, Dexamethasone and Treatment may vary due to age or medical condition  Airway Management Planned: Oral ETT  Additional Equipment: None  Intra-op Plan:   Post-operative Plan: Extubation in OR  Informed Consent: I have reviewed the patients History and Physical, chart, labs and discussed the procedure including the risks, benefits and alternatives for the proposed anesthesia with the patient or authorized representative who has indicated his/her understanding and acceptance.     Dental advisory given  Plan Discussed with: CRNA and Surgeon  Anesthesia Plan Comments: (Discussed risks of anesthesia with patient, including PONV, sore throat, lip/dental/eye damage. Rare risks discussed as well, such as cardiorespiratory and neurological sequelae, and allergic reactions. Discussed the role of CRNA in patient's perioperative care. Patient understands.)        Anesthesia Quick Evaluation

## 2021-05-07 NOTE — Anesthesia Procedure Notes (Signed)
Procedure Name: Intubation Date/Time: 05/07/2021 12:07 PM Performed by: Lowry Bowl, CRNA Pre-anesthesia Checklist: Patient identified, Emergency Drugs available, Suction available and Patient being monitored Patient Re-evaluated:Patient Re-evaluated prior to induction Oxygen Delivery Method: Circle system utilized Preoxygenation: Pre-oxygenation with 100% oxygen Induction Type: IV induction, Cricoid Pressure applied and Rapid sequence Ventilation: Mask ventilation without difficulty Laryngoscope Size: 3 and McGraph Grade View: Grade II Tube type: Oral Tube size: 6.5 mm Number of attempts: 1 Airway Equipment and Method: Stylet and Video-laryngoscopy Placement Confirmation: ETT inserted through vocal cords under direct vision, positive ETCO2 and breath sounds checked- equal and bilateral Secured at: 21 cm Tube secured with: Tape Dental Injury: Teeth and Oropharynx as per pre-operative assessment

## 2021-05-07 NOTE — OR Nursing (Signed)
Called waiting room to update family.

## 2021-05-07 NOTE — Op Note (Signed)
OPERATIVE NOTE  DATE OF SURGERY:  05/07/2021  PATIENT NAME:  Rhonda Tyler   DOB: Oct 11, 1940  MRN: [id number]  PRE-OPERATIVE DIAGNOSIS: Left periprosthetic distal femur fracture  POST-OPERATIVE DIAGNOSIS:  Same  PROCEDURE:  Left total knee revision arthroplasty   SURGEON:  Marciano Sequin. M.D.  ASSISTANT: Cassell Smiles, PA-C (present and scrubbed throughout the case, critical for assistance with exposure, retraction, instrumentation, and closure)  ANESTHESIA: general  ESTIMATED BLOOD LOSS: 100 mL  FLUIDS REPLACED: 1100 mL of crystalloid  TOURNIQUET TIME: 90 minutes  DRAINS: none  IMPLANTS UTILIZED: DePuy small S-ROM Noiles rotating hinge femoral component, 46 mm fully porous-coated universal femoral sleeve, 75 mm x 16 mm universal fluted stem, size 3 MBT revision rotating platform tibial component (cemented), 53 mm MBT revision porous metaphyseal sleeve, 75 mm x 12 mm universal fluted stem, and a 12 mm LPS small hinge polyethylene insert.  INDICATIONS FOR SURGERY: Rhonda Tyler is a 80 y.o. year old female who had a remote history of a left total knee arthroplasty.  She had done well until December 13 when she fell while attempting to get into a car.  She sustained a left periprosthetic distal femur fracture. After discussion of the risks and benefits of surgical intervention, the patient expressed understanding of the risks benefits and agree with plans for total knee revision arthroplasty.   The risks, benefits, and alternatives were discussed at length including but not limited to the risks of infection, bleeding, nerve injury, stiffness, blood clots, the need for revision surgery, cardiopulmonary complications, among others, and they were willing to proceed.  PROCEDURE IN DETAIL: The patient was brought into the operating room and, after adequate general anesthesia was achieved, a tourniquet was placed on the patient's upper thigh. The patient's knee and leg were cleaned and  prepped with alcohol and DuraPrep and draped in the usual sterile fashion. A "timeout" was performed as per usual protocol. The lower extremity was exsanguinated using an Esmarch, and the tourniquet was inflated to 300 mmHg. An anterior longitudinal incision was made followed by a standard medial parapatellar approach.  A moderate hemarthrosis was evacuated.  Swabs were obtained for gram stain and culture.  The deep fibers of the medial collateral ligament were elevated in a subperiosteal fashion off of the medial flare of the tibia so as to maintain a continuous soft tissue sleeve. The patella was subluxed laterally.  Medial and lateral gutters were reestablished and a synovectomy was performed.  Inspection of the knee demonstrated a grossly comminuted distal femur fracture with displacement of the femoral implant.  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  The polyethylene insert was removed.  The femoral implant and attached bone was removed without difficulty.  Next, attention was directed to the tibial component.  There was no gross loosening of the tibia.  A TPS high-speed saw was used to interrupt the implant cement interface.  Thin osteotomes were used to complete the dissection.  The tibial tray was then tapped out with little sacrifice of bone.  Remaining cement was debrided.  Again, the metaphyseal bone was extremely osteoporotic.  The tourniquet was deflated after total tourniquet time of 90 minutes.  The femoral and tibial canals were reamed in a sequential fashion up to a 12 mm diameter for the tibia and a 16 mm diameter for the femur.  Serial broaches were inserted into the tibia up to a 53 mm broach.  The proximal tibia was sized and was felt that  a size 3 tray would be appropriate.  A trial construct with a size 3 revision tray, a 53 mm sleeve, and a 12 x 75 mm stem was inserted.  Attention was then directed to the femur.  Serial broaches were inserted up to a 46 mm femoral broach.  Distal  femoral cutting guide was attached so was to even the remaining portion of the distal femur.  A trial reduction was performed with a small S-ROM femoral component and a 12 mm polyethylene trial.  This allowed for full extension and greater than 90 degrees of flexion.  The trial components were removed.  A femoral implant construct was created using a left small S-ROM Noiles rotating-hinge femoral implant, a 46 mm fully porous-coated universal femoral sleeve, and a 75 mm x 60 mm universal fluted stem.  A tibial implant construct was created using a size 3 MBT revision tibial tray, a 53 mm porous-coated metaphyseal sleeve, and a 75 mm x 12 mm universal fluted stem.  The cut surfaces of bone were irrigated with copious amounts normal saline with antibiotic solution and suctioned dry.  Polymethylmethacrylate cement with gentamicin was prepared in the usual fashion using vacuum mixer.  Cement was applied to the undersurface of a size 3 rotating platform tibial tray.  The tibial component construct was positioned and impacted into place. Excess cement was removed using [employer] fast streamer. The femoral component construct was positioned and impacted into place. A 12 mm polyethylene trial was inserted and the knee was brought into full extension with steady axial compression applied. Hemostasis was achieved using electrocautery. The knee was irrigated with copious amounts of normal saline using pulsatile lavage followed by 350 ml of Prontosan and then suctioned dry. 20 mL of 1.3% Exparel and 60 mL of 0.25% Marcaine in 40 mL of normal saline was injected along the posterior capsule, medial and lateral gutters, and along the arthrotomy site. A 12 mm LPS small hinge polyethylene insert was inserted locked into place.  The knee was placed through a range of motion with excellent mediolateral soft tissue balancing appreciated and excellent patellar tracking noted.  The medial parapatellar incision was reapproximated using  interrupted sutures of #1 Vicryl. Subcutaneous tissue was approximated in layers using first #0 Vicryl followed #2-0 Vicryl. The skin was approximated with skin staples. A sterile dressing was applied.  The patient tolerated the procedure well and was transported to the recovery room in stable condition.    Landon Truax P. Holley Bouche., M.D.

## 2021-05-07 NOTE — Transfer of Care (Signed)
Immediate Anesthesia Transfer of Care Note  Patient: Rhonda Tyler  Procedure(s) Performed: TOTAL KNEE REVISION (Left: Knee)  Patient Location: PACU  Anesthesia Type:General  Level of Consciousness: drowsy  Airway & Oxygen Therapy: Patient Spontanous Breathing and Patient connected to face mask oxygen  Post-op Assessment: Report given to RN  Post vital signs: stable  Last Vitals:  Vitals Value Taken Time  BP 118/52 05/07/21 1800  Temp 36.4 C 05/07/21 1755  Pulse 72 05/07/21 1804  Resp 10 05/07/21 1804  SpO2 100 % 05/07/21 1804  Vitals shown include unvalidated device data.  Last Pain:  Vitals:   05/07/21 0943  TempSrc: Temporal  PainSc: 10-Worst pain ever         Complications: No notable events documented.

## 2021-05-08 ENCOUNTER — Encounter: Payer: Self-pay | Admitting: Orthopedic Surgery

## 2021-05-08 LAB — GLUCOSE, CAPILLARY
Glucose-Capillary: 240 mg/dL — ABNORMAL HIGH (ref 70–99)
Glucose-Capillary: 224 mg/dL — ABNORMAL HIGH (ref 70–99)
Glucose-Capillary: 228 mg/dL — ABNORMAL HIGH (ref 70–99)
Glucose-Capillary: 241 mg/dL — ABNORMAL HIGH (ref 70–99)

## 2021-05-08 MED ORDER — ADULT MULTIVITAMIN W/MINERALS CH
1.0000 | ORAL_TABLET | Freq: Every day | ORAL | Status: DC
  Administered 2021-05-08 – 2021-05-13 (×6): 1 via ORAL
  Filled 2021-05-08 (×6): qty 1

## 2021-05-08 MED ORDER — ENSURE ENLIVE PO LIQD
237.0000 mL | Freq: Three times a day (TID) | ORAL | Status: DC
  Administered 2021-05-08 – 2021-05-12 (×13): 237 mL via ORAL

## 2021-05-08 NOTE — TOC Initial Note (Signed)
Transition of Care [facility]) - Initial/Assessment Note    Patient Details  Name: Rhonda Tyler MRN: [id number] Date of Birth: [birth year]  Transition of Care [facility]) CM/SW Contact:    Candie Chroman, LCSW Phone Number: 05/08/2021, 3:09 PM  Clinical Narrative:  This CSW working remote today. Called patient in the room, introduced role, and explained that therapy recommendations would be discussed. Patient is agreeable to SNF placement. She is not familiar with local facilities so no preference at this time. Will review bed offers once available. No further concerns. CSW encouraged patient to contact CSW as needed. CSW will continue to follow patient for support and facilitate discharge to SNF once medically stable.                Expected Discharge Plan: Skilled Nursing Facility Barriers to Discharge: Continued Medical Work up   Patient Goals and CMS Choice        Expected Discharge Plan and Services Expected Discharge Plan: [place] Acute Care Choice: [place] Living arrangements for the past 2 months: Single Family Home                                      Prior Living Arrangements/Services Living arrangements for the past 2 months: Single Family Home Lives with:: Adult Children Patient language and need for interpreter reviewed:: Yes Do you feel safe going back to the place where you live?: Yes      Need for Family Participation in Patient Care: Yes (Comment) Care giver support system in place?: Yes (comment)   Criminal Activity/Legal Involvement Pertinent to Current Situation/Hospitalization: No - Comment as needed  Activities of Daily Living Home Assistive Devices/Equipment: None ADL Screening (condition at time of admission) Patient's cognitive ability adequate to safely complete daily activities?: Yes Is the patient deaf or have difficulty hearing?: No Does the patient have difficulty seeing, even when wearing  glasses/contacts?: No Does the patient have difficulty concentrating, remembering, or making decisions?: No Patient able to express need for assistance with ADLs?: Yes Does the patient have difficulty dressing or bathing?: No Independently performs ADLs?: Yes (appropriate for developmental age) Does the patient have difficulty walking or climbing stairs?: Yes Weakness of Legs: Both Weakness of Arms/Hands: Both  Permission Sought/Granted Permission sought to share information with : Facility Art therapist granted to share information with : Yes, Verbal Permission Granted     Permission granted to share info w AGENCY: SNF's        Emotional Assessment Appearance:: Appears stated age Attitude/Demeanor/Rapport: Engaged, Gracious Affect (typically observed): Accepting, Appropriate, Calm, Pleasant Orientation: : Oriented to Self, Oriented to Place, Oriented to  Time, Oriented to Situation Alcohol / Substance Use: Not Applicable Psych Involvement: No (comment)  Admission diagnosis:  S/P revision of total knee [Z96.659] Patient Active Problem List   Diagnosis Date Noted   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   Periprosthetic fracture around internal prosthetic left knee joint 05/05/2021   Idiopathic gout, unspecified site 11/20/2020   Itching 11/20/2020   Anemia in chronic kidney disease 10/16/2020   Heart failure, unspecified ([place]) 10/16/2020   Hyperparathyroidism due to renal insufficiency ([place]) 10/16/2020   Proteinuria, unspecified 10/16/2020   Chronic kidney disease, stage 4 (severe) ([place]) 06/03/2020   Liver cirrhosis secondary to NASH (nonalcoholic steatohepatitis) ([place]) 01/22/2020   Arthritis of left shoulder region 01/06/2020   Iron  deficiency 01/06/2020   Chest pain 01/05/2020   AKI (acute kidney injury) ([place])    Essential hypertension    OSA on CPAP 09/05/2019   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 ([address]), [place]) [place] [address] Phone: [phone] Fax: [phone]     Social Determinants of Health (SDOH) Interventions    Readmission Risk Interventions No flowsheet data found.

## 2021-05-08 NOTE — NC FL2 (Signed)
[place] LEVEL OF CARE SCREENING TOOL     IDENTIFICATION  Patient Name: Rhonda Tyler Birthdate: 06-28-1940 Sex: female Admission Date (Current Location): 05/07/2021  [facility] and [place] Number:  Engineering geologist and Address:  [facility], [address], [place], [place] [address]      Provider Number: [id number]  Attending Physician Name and Address:  Dereck Leep, MD  Relative Name and Phone Number:       Current Level of Care: Hospital Recommended Level of Care: [place] Prior Approval Number:    Date Approved/Denied:   PASRR Number: [id number]  Discharge Plan: SNF    Current Diagnoses: Patient Active Problem List   Diagnosis Date Noted   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   Periprosthetic fracture around internal prosthetic left knee joint 05/05/2021   Idiopathic gout, unspecified site 11/20/2020   Itching 11/20/2020   Anemia in chronic kidney disease 10/16/2020   Heart failure, unspecified ([place]) 10/16/2020   Hyperparathyroidism due to renal insufficiency ([place]) 10/16/2020   Proteinuria, unspecified 10/16/2020   Chronic kidney disease, stage 4 (severe) ([place]) 06/03/2020   Liver cirrhosis secondary to NASH (nonalcoholic steatohepatitis) ([place]) 01/22/2020   Arthritis of left shoulder region 01/06/2020   Iron deficiency 01/06/2020   Chest pain 01/05/2020   AKI (acute kidney injury) ([place])    Essential hypertension    OSA on CPAP 09/05/2019   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   Hypercalcemia 02/13/2019   Iron deficiency anemia 06/30/2018   Acute respiratory failure with hypoxia ([place]) 03/23/2018   Hyperlipidemia due to type 2 diabetes mellitus ([place]) 04/09/2014   Type 2 diabetes mellitus with diabetic chronic kidney disease ([place]) 03/04/2014   Adrenal nodule ([place]) 11/28/2013   B12 deficiency 11/28/2013   CAD (coronary artery disease), autologous vein bypass graft 08/05/2013   DDD (degenerative disc  disease), lumbosacral 08/05/2013   Headache 08/05/2013    Orientation RESPIRATION BLADDER Height & Weight     Self, Time, Situation, Place  Normal Continent Weight: 163 lb (73.9 kg) Height:  5\' 3"  (160 cm)  BEHAVIORAL SYMPTOMS/MOOD NEUROLOGICAL BOWEL NUTRITION STATUS   (None)  (None) Continent Diet (Regular)  AMBULATORY STATUS COMMUNICATION OF NEEDS Skin   Extensive Assist Verbally Surgical wounds, Bruising (Incision on left knee: Honeycomb dressing.)                       Personal Care Assistance Level of Assistance  Bathing, Feeding, Dressing Bathing Assistance: Maximum assistance Feeding assistance: Limited assistance Dressing Assistance: Maximum assistance     Functional Limitations Info  Sight, Hearing, Speech Sight Info: Adequate Hearing Info: Adequate Speech Info: Adequate    SPECIAL CARE FACTORS FREQUENCY  PT (By licensed PT), OT (By licensed OT)     PT Frequency: 5 x week OT Frequency: 5 x week            Contractures Contractures Info: Not present    Additional Factors Info  Code Status, Allergies Code Status Info: Full code Allergies Info: Penicillins, Codeine, Levaquin (Levofloxacin In D5w), Lipitor (Atorvastatin [place]), Lopressor (Metoprolol Tartrate), Potassium-containing Compounds, Procardia (Nifedipine), Sucralfate, Tramadol, Allegra (Fexofenadine), Naltrexone, Sulfa Antibiotics           Current Medications (05/08/2021):  This is the current hospital active medication list Current Facility-Administered Medications  Medication Dose Route Frequency Provider Last Rate Last Admin   0.9 %  sodium chloride infusion   Intravenous Continuous Hooten, Laurice Record, MD 100 mL/hr at 05/07/21 2003  New Bag at 05/07/21 2003   acetaminophen (OFIRMEV) IV 1,000 mg  1,000 mg Intravenous Q6H Hooten, Laurice Record, MD 400 mL/hr at 05/08/21 1259 1,000 mg at 05/08/21 1259   acetaminophen (TYLENOL) tablet 325-650 mg  325-650 mg Oral Q6H PRN Hooten, Laurice Record, MD       alum  & mag hydroxide-simeth (MAALOX/MYLANTA) 200-200-20 MG/5ML suspension 30 mL  30 mL Oral Q4H PRN Hooten, Laurice Record, MD       bisacodyl (DULCOLAX) suppository 10 mg  10 mg Rectal Daily PRN Dereck Leep, MD   10 mg at 05/08/21 4818   diphenhydrAMINE (BENADRYL) 12.5 MG/5ML elixir 12.5-25 mg  12.5-25 mg Oral Q4H PRN Hooten, Laurice Record, MD       enoxaparin (LOVENOX) injection 30 mg  30 mg Subcutaneous Q24H Hooten, Laurice Record, MD   30 mg at 05/08/21 0910   feeding supplement (ENSURE ENLIVE / ENSURE PLUS) liquid 237 mL  237 mL Oral TID BM Hooten, Laurice Record, MD       ferrous sulfate tablet 325 mg  325 mg Oral BID WC Dereck Leep, MD   325 mg at 05/08/21 5631   HYDROmorphone (DILAUDID) injection 0.5-1 mg  0.5-1 mg Intravenous Q4H PRN Dereck Leep, MD   1 mg at 05/08/21 1229   insulin aspart (novoLOG) injection 0-15 Units  0-15 Units Subcutaneous TID WC Hooten, Laurice Record, MD   5 Units at 05/08/21 1300   insulin aspart (novoLOG) injection 0-5 Units  0-5 Units Subcutaneous QHS Dereck Leep, MD   2 Units at 05/07/21 2131   magnesium hydroxide (MILK OF MAGNESIA) suspension 30 mL  30 mL Oral Daily Hooten, Laurice Record, MD   30 mL at 05/08/21 0910   menthol-cetylpyridinium (CEPACOL) lozenge 3 mg  1 lozenge Oral PRN Hooten, Laurice Record, MD       Or   phenol (CHLORASEPTIC) mouth spray 1 spray  1 spray Mouth/Throat PRN Hooten, Laurice Record, MD       metoCLOPramide (REGLAN) tablet 10 mg  10 mg Oral TID AC & HS Hooten, Laurice Record, MD   10 mg at 05/08/21 1117   multivitamin with minerals tablet 1 tablet  1 tablet Oral Daily Hooten, Laurice Record, MD       ondansetron (ZOFRAN) tablet 4 mg  4 mg Oral Q6H PRN Hooten, Laurice Record, MD       Or   ondansetron (ZOFRAN) injection 4 mg  4 mg Intravenous Q6H PRN Hooten, Laurice Record, MD       oxyCODONE (Oxy IR/ROXICODONE) immediate release tablet 10 mg  10 mg Oral Q4H PRN Hooten, Laurice Record, MD       oxyCODONE (Oxy IR/ROXICODONE) immediate release tablet 5 mg  5 mg Oral Q4H PRN Hooten, Laurice Record, MD   5 mg at 05/08/21  1116   pantoprazole (PROTONIX) EC tablet 40 mg  40 mg Oral BID Dereck Leep, MD   40 mg at 05/08/21 4970   senna-docusate (Senokot-S) tablet 1 tablet  1 tablet Oral BID Dereck Leep, MD   1 tablet at 05/08/21 0909   sodium phosphate (FLEET) 7-19 GM/118ML enema 1 enema  1 enema Rectal Once PRN Hooten, Laurice Record, MD         Discharge Medications: Please see discharge summary for a list of discharge medications.  Relevant Imaging Results:  Relevant Lab Results:   Additional Information SS#: [id number]  Candie Chroman, LCSW

## 2021-05-08 NOTE — Progress Notes (Signed)
Pt arrived to unit in stable condition, vitals WDL, no signs of acute distress or complaints of pain. Dressing clean, dry and intact. Polar care on. Pt made aware of care plan. Call bell usage explained.

## 2021-05-08 NOTE — Plan of Care (Signed)

## 2021-05-08 NOTE — Progress Notes (Signed)
Initial Nutrition Assessment  DOCUMENTATION CODES:   Not applicable  INTERVENTION:   -Ensure Enlive po TID, each supplement provides 350 kcal and 20 grams of protein  -MVI with minerals daily -Liberalize diet to regular  NUTRITION DIAGNOSIS:   Increased nutrient needs related to post-op healing as evidenced by estimated needs.  GOAL:   Patient will meet greater than or equal to 90% of their needs  MONITOR:   PO intake, Supplement acceptance, Labs, Weight trends, Skin, I & O's  REASON FOR ASSESSMENT:   Malnutrition Screening Tool    ASSESSMENT:   The patient is a 80 y.o. female who presents today with her son for reevaluation of her left knee. She sustained a fall on 04/22/2021 while attempting to get into the car. She was evaluated at [facility] Emergency Department and fitted with a knee immobilizer. She was actually told that there was no evidence of a fracture. She has remained nonweightbearing to the left lower extremity since that time. Subsequent evaluation and radiographs demonstrated a displaced low supracondylar periprosthetic distal femur fracture. She reports severe left knee pain. She reports significant swelling of the knee. The pain is aggravated by any movement. She is using a wheelchair for ambulation.  Pt admitted for lt TKR revision.   12/28- s/p PROCEDURE:  Left total knee revision arthroplasty   Reviewed I/O's: +2 L x 24 hours  UOP: 630 ml x 24 hours  Spoke with pt and daughter at bedside. She complains of pain. Pt reports decreased appetite over the past several years, due to lack of taste and smell. Pt consumes 3 meals per day (eggs, toast, and muffin at breakfast and strawberry yogurt for lunch, and cream soup and peanut butter sandwich for dinner). Pt consumed only about 25% of her breakfast tray. She prefers strawberry flavored foods.   Per pt, she lost about 65# within the past year intentionally due to diet changes due to  cirrhosis. She follows a low sodium diet at home and has made a lot of changes to her diet.   Reviewed wt hx; pt has experienced a 7% wt loss over the past 3 months, which is not significant for time frame.   Discussed importance of good meal and supplement intake to promote healing. Pt amenable to strawberry Ensure. Pt also amenable to liberalizing diet.   Medications reviewed and include reglan, senokot, and ferrous sulfate.   Labs reviewed: CBGS: 216-240 (inpatient orders for glycemic control are 0-15 units insulin aspart TID with meals and 0-5 units insulin aspart).    NUTRITION - FOCUSED PHYSICAL EXAM:  Flowsheet Row Most Recent Value  Orbital Region No depletion  Upper Arm Region Mild depletion  Thoracic and Lumbar Region No depletion  Buccal Region No depletion  Temple Region Mild depletion  Clavicle Bone Region No depletion  Clavicle and Acromion Bone Region No depletion  Scapular Bone Region No depletion  Dorsal Hand No depletion  Patellar Region No depletion  Anterior Thigh Region No depletion  Posterior Calf Region No depletion  Edema (RD Assessment) Mild  Hair Reviewed  Eyes Reviewed  Mouth Reviewed  Skin Reviewed  Nails Reviewed       Diet Order:   Diet Order             Diet regular Room service appropriate? Yes; Fluid consistency: Thin  Diet effective now                   EDUCATION NEEDS:   Education  needs have been addressed  Skin:  Skin Assessment: Skin Integrity Issues: Skin Integrity Issues:: Incisions Incisions: closed lt knee  Last BM:  05/08/21  Height:   Ht Readings from Last 1 Encounters:  05/07/21 5\' 3"  (1.6 m)    Weight:   Wt Readings from Last 1 Encounters:  05/07/21 73.9 kg    Ideal Body Weight:  52.3 kg  BMI:  Body mass index is 28.87 kg/m.  Estimated Nutritional Needs:   Kcal:  1550-1750  Protein:  75-90 grams  Fluid:  > 1.5 L    Loistine Chance, RD, LDN, [place] Registered Dietitian II Certified Diabetes  Care and Education Specialist Please refer to [facility] for RD and/or RD on-call/weekend/after hours pager

## 2021-05-08 NOTE — Anesthesia Postprocedure Evaluation (Signed)
Anesthesia Post Note  Patient: Rhonda Tyler  Procedure(s) Performed: TOTAL KNEE REVISION (Left: Knee)  Patient location during evaluation: PACU Anesthesia Type: General Level of consciousness: awake and alert Pain management: pain level controlled Vital Signs Assessment: post-procedure vital signs reviewed and stable Respiratory status: spontaneous breathing, nonlabored ventilation and respiratory function stable Cardiovascular status: blood pressure returned to baseline and stable Postop Assessment: no apparent nausea or vomiting Anesthetic complications: no   No notable events documented.   Last Vitals:  Vitals:   05/07/21 2324 05/08/21 0335  BP: (!) 121/38 (!) 122/43  Pulse: 62 (!) 56  Resp: 15 17  Temp: 36.5 C 36.5 C  SpO2: 100% 100%    Last Pain:  Vitals:   05/08/21 0052  TempSrc:   PainSc: 0-No pain                 [place] Ouch

## 2021-05-08 NOTE — Progress Notes (Signed)
°  Subjective: 1 Day Post-Op Procedure(s) (LRB): TOTAL KNEE REVISION (Left) Patient reports pain as mild.   Patient is well, and has had no acute complaints or problems PT and care management to assist with discharge planning. Negative for chest pain and shortness of breath Fever: no Gastrointestinal:Negative for nausea and vomiting Patient states that she is passing gas this morning.  Objective: Vital signs in last 24 hours: Temp:  [97.1 F (36.2 C)-98.9 F (37.2 C)] 97.7 F (36.5 C) (12/29 0335) Pulse Rate:  [56-74] 56 (12/29 0335) Resp:  [8-18] 17 (12/29 0335) BP: (112-140)/(38-72) 122/43 (12/29 0335) SpO2:  [92 %-100 %] 100 % (12/29 0335) Weight:  [73.9 kg] 73.9 kg (12/28 0943)  Intake/Output from previous day:  Intake/Output Summary (Last 24 hours) at 05/08/2021 0735 Last data filed at 05/08/2021 0521 Gross per 24 hour  Intake 2689.81 ml  Output 730 ml  Net 1959.81 ml    Intake/Output this shift: No intake/output data recorded.  Labs: No results for input(s): HGB in the last 72 hours. No results for input(s): WBC, RBC, HCT, PLT in the last 72 hours. No results for input(s): NA, K, CL, CO2, BUN, CREATININE, GLUCOSE, CALCIUM in the last 72 hours. No results for input(s): LABPT, INR in the last 72 hours.   EXAM General - Patient is Alert, Appropriate, and Oriented Extremity - ABD soft Neurovascular intact Dorsiflexion/Plantar flexion intact No cellulitis present Dressing/Incision - Bulky dressing intact to the left leg, no drainage noted. Motor Function - intact, moving foot and toes well on exam.  Abdomen soft with normal bowel sounds. Negative Homans to bilateral lower extremities.  Past Medical History:  Diagnosis Date   Anginal pain ([place])    Arthritis    B12 deficiency anemia    Cervical disc disease    Cervical disc disease    Chest pain    Cholecystolithiasis    Chronic kidney disease    Cirrhosis of liver ([place]) 11/2019   Colon polyp    Coronary  artery disease    Cutaneous sarcoidosis    Diabetes mellitus without complication (HCC)    Pt takes insulin   Diabetic neuropathy (HCC)    GERD (gastroesophageal reflux disease)    Headache    migaines in the past   Hemorrhoid    History of diabetic neuropathy    Hypercalcemia    Hyperlipemia    Hypertension    IBS (irritable bowel syndrome)    Iron deficiency anemia    Myocardial infarction (HCC)    Pneumonia    Sleep apnea     Assessment/Plan: 1 Day Post-Op Procedure(s) (LRB): TOTAL KNEE REVISION (Left) Principal Problem:   S/P revision of total knee  Estimated body mass index is 28.87 kg/m as calculated from the following:   Height as of this encounter: 5\' 3"  (1.6 m).   Weight as of this encounter: 73.9 kg. Advance diet Up with therapy D/C IV fluids when tolerating po intake.  Vitals stable this morning. Up with therapy today. Patient is passing gas, work on BM. PT and Care management to assist with discharge planning.  DVT Prophylaxis - Lovenox and Foot Pumps Partial weightbearing to the left leg.  Raquel Aniyah Nobis, PA-C [facility] Orthopaedic Surgery 05/08/2021, 7:35 AM

## 2021-05-08 NOTE — Discharge Summary (Addendum)
Physician Discharge Summary  Patient ID: Rhonda Tyler MRN: [id number] DOB/AGE: [birth year] 78 y.o.  Admit date: 05/07/2021 Discharge date: 05/13/2021  Admission Diagnoses:  PROCEDURE:  Left total knee revision arthroplasty    SURGEON:  Marciano Sequin. M.D.   ASSISTANT: Cassell Smiles, PA-C (present and scrubbed throughout the case, critical for assistance with exposure, retraction, instrumentation, and closure)   ANESTHESIA: general   ESTIMATED BLOOD LOSS: 100 mL   FLUIDS REPLACED: 1100 mL of crystalloid   TOURNIQUET TIME: 90 minutes   DRAINS: none   IMPLANTS UTILIZED: DePuy small S-ROM Noiles rotating hinge femoral component, 46 mm fully porous-coated universal femoral sleeve, 75 mm x 16 mm universal fluted stem, size 3 MBT revision rotating platform tibial component (cemented), 53 mm MBT revision porous metaphyseal sleeve, 75 mm x 12 mm universal fluted stem, and a 12 mm LPS small hinge polyethylene insert. Surgeries:Procedure(s): TOTAL KNEE REVISION on 05/07/2021  Discharge Diagnoses: Patient Active Problem List   Diagnosis Date Noted   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   Periprosthetic fracture around internal prosthetic left knee joint 05/05/2021   Idiopathic gout, unspecified site 11/20/2020   Itching 11/20/2020   Anemia in chronic kidney disease 10/16/2020   Heart failure, unspecified ([place]) 10/16/2020   Hyperparathyroidism due to renal insufficiency ([place]) 10/16/2020   Proteinuria, unspecified 10/16/2020   Chronic kidney disease, stage 4 (severe) ([place]) 06/03/2020   Liver cirrhosis secondary to NASH (nonalcoholic steatohepatitis) ([place]) 01/22/2020   Arthritis of left shoulder region 01/06/2020   Iron deficiency 01/06/2020   Chest pain 01/05/2020   AKI (acute kidney injury) ([place])    Essential hypertension    OSA on CPAP 09/05/2019   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   Hypercalcemia 02/13/2019   Iron deficiency anemia 06/30/2018   Acute respiratory failure with  hypoxia ([place]) 03/23/2018   Hyperlipidemia due to type 2 diabetes mellitus ([place]) 04/09/2014   Type 2 diabetes mellitus with diabetic chronic kidney disease ([place]) 03/04/2014   Adrenal nodule ([place]) 11/28/2013   B12 deficiency 11/28/2013   CAD (coronary artery disease), autologous vein bypass graft 08/05/2013   DDD (degenerative disc disease), lumbosacral 08/05/2013   Headache 08/05/2013    Past Medical History:  Diagnosis Date   Anginal pain (HCC)    Arthritis    B12 deficiency anemia    Cervical disc disease    Cervical disc disease    Chest pain    Cholecystolithiasis    Chronic kidney disease    Cirrhosis of liver ([place]) 11/2019   Colon polyp    Coronary artery disease    Cutaneous sarcoidosis    Diabetes mellitus without complication ([place])    Pt takes insulin   Diabetic neuropathy (HCC)    GERD (gastroesophageal reflux disease)    Headache    migaines in the past   Hemorrhoid    History of diabetic neuropathy    Hypercalcemia    Hyperlipemia    Hypertension    IBS (irritable bowel syndrome)    Iron deficiency anemia    Myocardial infarction ([place])    Pneumonia    Sleep apnea      Transfusion: n/a   Consultants (if any):   Discharged Condition: Improved  Hospital Course: Rhonda Tyler is an 80 y.o. female who was admitted 05/07/2021 with a diagnosis of left knee periprosthetic fracture and went to the operating room on 05/07/2021 and underwent revision of left knee total arthroplasty. The patient received perioperative antibiotics for prophylaxis (see below). The patient tolerated  the procedure well and was transported to PACU in stable condition. After meeting PACU criteria, the patient was subsequently transferred to the Orthopaedics/Rehabilitation unit.   The patient received DVT prophylaxis in the form of early mobilization, Lovenox, Foot Pumps, and TED hose. A sacral pad had been placed and heels were elevated off of the bed with rolled towels in order to protect  skin integrity. Foley catheter was discontinued on postoperative day #0. Wound drains were discontinued on postoperative day #2. The surgical incision was healing well without signs of infection.  Physical therapy was initiated postoperatively for transfers, gait training, and strengthening. Occupational therapy was initiated for activities of daily living and evaluation for assisted devices. Rehabilitation goals were reviewed in detail with the patient. The patient made steady progress with physical therapy and physical therapy recommended discharge to Rehab.   Patient did develop mild cough and congestion on postop day 3 and 4.  Chest x-ray negative.  Patient with acute postop blood loss anemia with underlying chronic anemia.  Patient on iron supplement.  Hemoglobin stable around 7.7 on postop day 4 and 7.5 on postop day 5.  The patient achieved the preliminary goals of this hospitalization and was felt to be medically and orthopaedically appropriate for discharge.  She was given perioperative antibiotics:  Anti-infectives (From admission, onward)    Start     Dose/Rate Route Frequency Ordered Stop   05/07/21 2300  ceFAZolin (ANCEF) IVPB 2g/100 mL premix        2 g 200 mL/hr over 30 Minutes Intravenous Every 6 hours 05/07/21 1946 05/08/21 0546   05/07/21 1005  ceFAZolin (ANCEF) 2-4 GM/100ML-% IVPB       Note to Pharmacy: Maryagnes Amos B: cabinet override      05/07/21 1005 05/08/21 0055   05/07/21 0600  ceFAZolin (ANCEF) IVPB 2g/100 mL premix        2 g 200 mL/hr over 30 Minutes Intravenous On call to O.R. 05/07/21 9485 05/07/21 1630     .  Recent vital signs:  Vitals:   05/13/21 0436 05/13/21 0747  BP: (!) 139/59 110/67  Pulse: 94 (!) 104  Resp: 16 16  Temp: 98.1 F (36.7 C) (!) 97.5 F (36.4 C)  SpO2: 99% 99%    Recent laboratory studies:  Recent Labs    05/11/21 0430 05/12/21 0542 05/12/21 1429 05/13/21 0438  WBC 6.8  --  8.0 4.6  HGB 7.7* 7.5* 9.2* 7.8*  HCT  24.2* 23.0* 28.3* 23.7*  PLT 201  --  242 195  K 5.1  --   --   --   CL 94*  --   --   --   CO2 25  --   --   --   BUN 30*  --   --   --   CREATININE 0.87  --   --   --   GLUCOSE 218*  --   --   --   CALCIUM 9.0  --   --   --     Diagnostic Studies: CT HEAD WO CONTRAST (5MM)  Result Date: 04/21/2021 CLINICAL DATA:  Head trauma, moderate-severe; Neck trauma, intoxicated or obtunded (Age >= 16y) EXAM: CT HEAD WITHOUT CONTRAST CT CERVICAL SPINE WITHOUT CONTRAST TECHNIQUE: Multidetector CT imaging of the head and cervical spine was performed following the standard protocol without intravenous contrast. Multiplanar CT image reconstructions of the cervical spine were also generated. COMPARISON:  MRI head April 05, 2021.  CT head 01/28/2011. FINDINGS: CT HEAD  FINDINGS Brain: No evidence of acute large vascular territory infarct, acute hemorrhage, abnormal mass effect, or extra-axial fluid collection. 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 Vascular: Calcific intracranial atherosclerosis. No hyperdense vessel identified. Skull: No acute fracture. Sinuses/Orbits: Clear sinuses.  Unremarkable orbits. Other: No mastoid effusions. CT CERVICAL SPINE FINDINGS Alignment: Straightening of the normal cervical lordosis. No substantial sagittal subluxation. Skull base and vertebrae: C3-T1 corpectomy and anterior plate and screw fusion. Solid osseous fusion across the disc spaces spanning C3-C6. No evidence of osseous fusion across the C7-T1 disc space. No evidence of hardware fracture. Soft tissues and spinal canal: No prevertebral fluid or swelling. No visible canal hematoma. Disc levels: C7-T1 degenerative disc disease with disc height loss, endplate sclerosis. Multilevel facet/uncovertebral hypertrophy with varying degrees of neural foraminal stenosis. Diffuse osteopenia. Upper chest: Multiple 2-3 mm pulmonary nodules in bilateral upper lobes. Other:  Subcentimeter right thyroid nodule, which does not require further imaging follow-up (ref: J Am Coll Radiol. 2015 Feb;12(2): 143-50). IMPRESSION: CT head: 1. No evidence of acute intracranial abnormality. 2. 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 CT cervical spine: 1. No evidence of acute fracture or traumatic malalignment. 2. C3-T1 corpectomy and anterior fusion. Solid osseous fusion across the C3-C7 disc spaces without evidence of fusion across the C7-T1 disc space. 3. Multiple 2-3 mm pulmonary nodules in bilateral upper lobes. No follow-up needed if patient is low-risk (and has no known or suspected primary neoplasm). Non-contrast chest CT can be considered in 12 months if patient is high-risk. This recommendation follows the consensus statement: Guidelines for Management of Incidental Pulmonary Nodules Detected on CT Images: From the Fleischner Society 2017; Radiology 2017; 284:228-243. Electronically Signed   By: Margaretha Sheffield M.D.   On: 04/21/2021 18:36   CT Cervical Spine Wo Contrast  Result Date: 04/21/2021 CLINICAL DATA:  Head trauma, moderate-severe; Neck trauma, intoxicated or obtunded (Age >= 16y) EXAM: CT HEAD WITHOUT CONTRAST CT CERVICAL SPINE WITHOUT CONTRAST TECHNIQUE: Multidetector CT imaging of the head and cervical spine was performed following the standard protocol without intravenous contrast. Multiplanar CT image reconstructions of the cervical spine were also generated. COMPARISON:  MRI head April 05, 2021.  CT head 01/28/2011. FINDINGS: CT HEAD FINDINGS Brain: No evidence of acute large vascular territory infarct, acute hemorrhage, abnormal mass effect, or extra-axial fluid collection. 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 Vascular: Calcific intracranial atherosclerosis. No hyperdense vessel identified. Skull: No acute fracture. Sinuses/Orbits: Clear  sinuses.  Unremarkable orbits. Other: No mastoid effusions. CT CERVICAL SPINE FINDINGS Alignment: Straightening of the normal cervical lordosis. No substantial sagittal subluxation. Skull base and vertebrae: C3-T1 corpectomy and anterior plate and screw fusion. Solid osseous fusion across the disc spaces spanning C3-C6. No evidence of osseous fusion across the C7-T1 disc space. No evidence of hardware fracture. Soft tissues and spinal canal: No prevertebral fluid or swelling. No visible canal hematoma. Disc levels: C7-T1 degenerative disc disease with disc height loss, endplate sclerosis. Multilevel facet/uncovertebral hypertrophy with varying degrees of neural foraminal stenosis. Diffuse osteopenia. Upper chest: Multiple 2-3 mm pulmonary nodules in bilateral upper lobes. Other: Subcentimeter right thyroid nodule, which does not require further imaging follow-up (ref: J Am Coll Radiol. 2015 Feb;12(2): 143-50). IMPRESSION: CT head: 1. No evidence of acute intracranial abnormality. 2. 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 CT cervical spine:  1. No evidence of acute fracture or traumatic malalignment. 2. C3-T1 corpectomy and anterior fusion. Solid osseous fusion across the C3-C7 disc spaces without evidence of fusion across the C7-T1 disc space. 3. Multiple 2-3 mm pulmonary nodules in bilateral upper lobes. No follow-up needed if patient is low-risk (and has no known or suspected primary neoplasm). Non-contrast chest CT can be considered in 12 months if patient is high-risk. This recommendation follows the consensus statement: Guidelines for Management of Incidental Pulmonary Nodules Detected on CT Images: From the Fleischner Society 2017; Radiology 2017; 284:228-243. Electronically Signed   By: Margaretha Sheffield M.D.   On: 04/21/2021 18:36   US Abdomen Complete  Result Date: 04/18/2021 CLINICAL DATA:  [place] surveillance.  Cirrhosis of liver with  ascites. EXAM: ABDOMEN ULTRASOUND COMPLETE COMPARISON:  Abdominal MRI 11/24/2020. abdominal ultrasound 04/22/2020 FINDINGS: Gallbladder: Surgically absent. Common bile duct: Diameter: 6-7 mm, normal for postcholecystectomy. Liver: Heterogeneous hepatic parenchymal echogenicity, borderline increased compared to right kidney. Nodular hepatic contours. No focal hepatic lesion. Portal vein is patent on color Doppler imaging with normal direction of blood flow towards the liver. IVC: No abnormality visualized. Pancreas: Visualized portion unremarkable, majority is obscured by bowel gas. Spleen: Size and appearance within normal limits. Greatest splenic length is 11.9 cm, splenic volume is 291 cc. Right Kidney: Length: 9.4 cm. Normal parenchymal echogenicity. No hydronephrosis. No visualized stone or focal lesion. Left Kidney: Length: 9.1 cm. No hydronephrosis. Similar subcentimeter hypoechoic focus in the mid kidney. Otherwise normal parenchymal echogenicity. Abdominal aorta: No aneurysm visualized. Other findings: No abdominal ascites. IMPRESSION: 1. Hepatic cirrhosis. No focal hepatic lesion. No abdominal ascites. 2. No splenomegaly. 3. 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 Electronically Signed   By: Keith Rake M.D.   On: 04/18/2021 12:19   DG Chest Port 1 View  Result Date: 05/11/2021 CLINICAL DATA:  Dry cough and chest congestion staring last night. No fever or night sweats. Pt did have dizziness with ambulation yesterday. Hx of HTN, CAD, and CKD. EXAM: PORTABLE CHEST 1 VIEW COMPARISON:  01/05/2020 FINDINGS: Cardiac silhouette normal in size. Stable changes from a prior median sternotomy. No mediastinal or hilar masses. Prominent bronchovascular markings, stable. No evidence of pneumonia or pulmonary edema. No pleural effusion or pneumothorax. Skeletal structures are grossly intact. IMPRESSION: No active disease. Electronically Signed   By: Lajean Manes  M.D.   On: 05/11/2021 09:48   DG Knee Complete 4 Views Left  Addendum Date: 05/02/2021   ADDENDUM REPORT: 05/02/2021 08:42 ADDENDUM: On further review there is suggestion of low prosthetic fracture. Diffuse osteopenia heterotopic ossification about the knee joint. Electronically Signed   By: Keane Police D.O.   On: 05/02/2021 08:42   Result Date: 05/02/2021 CLINICAL DATA:  Knee pain, fall. EXAM: LEFT KNEE - COMPLETE 4+ VIEW COMPARISON:  None. FINDINGS: Status post left knee arthroplasty. No perihardware loosening or fracture. No appreciable joint effusion. IMPRESSION: Status post left knee arthroplasty without evidence of acute osseous abnormality. Electronically Signed: By: Keane Police D.O. On: 04/21/2021 18:02   DG Knee Left Port  Result Date: 05/07/2021 CLINICAL DATA:  Postop knee replacement EXAM: PORTABLE LEFT KNEE - 1-2 VIEW COMPARISON:  April 21, 2021 FINDINGS: Status post 3 component long-stem total knee arthroplasty, with a minimally displaced fracture of the medial femoral condyle and near anatomic postsurgical alignment. Cutaneous skin staples and subcutaneous edema/gas reflect postoperative change. Diffuse demineralization of bone. IMPRESSION: Postsurgical change of revision 3 component long-stem total knee arthroplasty with a  minimally displaced fracture of the medial femoral condyle and near anatomic postsurgical alignment. Electronically Signed   By: Dahlia Bailiff M.D.   On: 05/07/2021 18:25    Discharge Medications:   Allergies as of 05/13/2021       Reactions   Penicillins Hives   Codeine Other (See Comments)   Hallucinate   Levaquin [levofloxacin In D5w] Other (See Comments)   Unknown   Lipitor [atorvastatin Calcium] Other (See Comments)   Muscle pain   Lopressor [metoprolol Tartrate] Other (See Comments)   Heart races   Potassium-containing Compounds Other (See Comments)   Unknown   Procardia [nifedipine] Other (See Comments)   Heart races   Sucralfate Nausea  Only   Tramadol Other (See Comments)   Confusion   Allegra [fexofenadine] Rash   Naltrexone Other (See Comments)   Severe headache, nausea, "body flashes".   Sulfa Antibiotics Rash        Medication List     STOP taking these medications    aspirin EC 81 MG tablet       TAKE these medications    acetaminophen 500 MG tablet Commonly known as: TYLENOL Take 500-1,000 mg by mouth every 4 (four) hours as needed for mild pain or fever.   B-12 2500 MCG Tabs Take 2,500 mcg by mouth daily.   cholecalciferol 25 MCG (1000 UNIT) tablet Commonly known as: VITAMIN D3 Take 1,000 Units by mouth daily.   enoxaparin 40 MG/0.4ML injection Commonly known as: LOVENOX Inject 0.4 mLs (40 mg total) into the skin daily for 14 days.   fenofibrate 160 MG tablet Take 160 mg by mouth daily.   furosemide 40 MG tablet Commonly known as: LASIX Take 40 mg by mouth daily.   HYDROcodone-acetaminophen 5-325 MG tablet Commonly known as: NORCO/VICODIN Take 1-2 tablets by mouth every 6 (six) hours as needed for pain.   hydroxychloroquine 200 MG tablet Commonly known as: PLAQUENIL Take 200 mg by mouth daily.   hydrOXYzine 25 MG tablet Commonly known as: ATARAX Take 25 mg by mouth daily.   insulin NPH-regular Human (70-30) 100 UNIT/ML injection Inject 20 Units into the skin 2 (two) times daily with a meal.   insulin regular 100 units/mL injection Commonly known as: NOVOLIN R Inject 0-10 Units into the skin daily before lunch. Dose per sliding scale.   lisinopril 5 MG tablet Commonly known as: ZESTRIL Take 5 mg by mouth daily.   loratadine 10 MG tablet Commonly known as: CLARITIN Take 10 mg by mouth daily.   magnesium oxide 400 (240 Mg) MG tablet Commonly known as: MAG-OX Take 400 mg by mouth 2 (two) times daily.   meclizine 25 MG tablet Commonly known as: ANTIVERT Take 25 mg by mouth every 4 (four) hours as needed for dizziness.   multivitamin with minerals Tabs tablet Take 1  tablet by mouth at bedtime.   nitroGLYCERIN 0.4 MG SL tablet Commonly known as: NITROSTAT Place 0.4 mg under the tongue every 5 (five) minutes as needed for chest pain.   ondansetron 4 MG tablet Commonly known as: ZOFRAN Take 4 mg by mouth every 8 (eight) hours as needed for vomiting or nausea.   oxyCODONE 5 MG immediate release tablet Commonly known as: Oxy IR/ROXICODONE Take 1 tablet (5 mg total) by mouth every 4 (four) hours as needed for severe pain.   pantoprazole 40 MG tablet Commonly known as: PROTONIX Take 40 mg by mouth 2 (two) times daily.   propranolol ER 120 MG 24 hr capsule Commonly  known as: INDERAL LA Take 120 mg by mouth daily.   sertraline 100 MG tablet Commonly known as: ZOLOFT Take 100 mg by mouth daily.   spironolactone 50 MG tablet Commonly known as: ALDACTONE Take 50 mg by mouth daily.               Durable Medical Equipment  (From admission, onward)           Start     Ordered   05/07/21 1947  DME Walker rolling  Once       Question:  Patient needs a walker to treat with the following condition  Answer:  Total knee replacement status   05/07/21 1946   05/07/21 1947  DME Bedside commode  Once       Question:  Patient needs a bedside commode to treat with the following condition  Answer:  Total knee replacement status   05/07/21 1946            Disposition: Skilled nursing facility     Follow-up Information     Fausto Skillern, PA-C Follow up on 05/22/2021.   Specialty: Orthopedic Surgery Why: at 1:15pm Contact information: [place] [address] [phone]         Dereck Leep, MD Follow up on 06/19/2021.   Specialty: Orthopedic Surgery Why: at 2:00pm Contact information: [address] [place] [address] [place], PA-C 05/13/2021, 8:09 AM

## 2021-05-08 NOTE — Evaluation (Signed)
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  Clinical Impression  Patient seated in recliner beginning of session; just completed OT evaluation.  Son at bedside, supportive and encouraging throughout session.  Patient alert and oriented, follows commands; agreeable to participation with session.  L knee pain fairly controlled, rated 6/10 per FACES scale.  Demonstrates fair post-op strength (3-/5) and ROM (3-75 degrees) to L knee; fair isolated strength and ROM to L quad.  End-ranges limited by pain and post-op dressing.  Patient slightly pale in appearance with prolonged sitting; BP 97/86, HR 71.  Does seem to improve with accommodation to position.  Suspect some degree of orthostasis; will formally assess in PM.  RN informed/aware. Standing and gait deferred at this time due to suspected orthostasis and patient request to have breakfast (arrived during OT session).  Per OT, requiring mod assist for sit/stand and bed/chair transfer; frequent cuing for PWB L LE.  Will continue to assess/progress mobility as appropriate in subsequent sessions. Would benefit from skilled PT to address above deficits and promote optimal return to PLOF.; recommend transition to STR upon discharge from acute hospitalization, as patient without caregiver in the home to support upon discharge.         Recommendations for follow up therapy are one component of a multi-disciplinary discharge planning process, led by the attending physician.  Recommendations may be updated based on patient status, additional functional criteria and insurance authorization.  Follow Up Recommendations Skilled nursing-short term rehab (<3 hours/day)    Assistance Recommended at Discharge Frequent or constant Supervision/Assistance  Functional  Status Assessment Patient has had a recent decline in their functional status and demonstrates the ability to make significant improvements in function in a reasonable and predictable amount of time.  Equipment Recommendations  Rolling walker (2 wheels);BSC/3in1    Recommendations for Other Services       Precautions / Restrictions Precautions Precautions: Fall Restrictions Weight Bearing Restrictions: Yes LLE Weight Bearing: Partial weight bearing      Mobility  Bed Mobility               General bed mobility comments: seated in recliner beginning/end of treatment session    Transfers                   General transfer comment: Per OT, mod assist for sit/stand and SPT to chair immediately prior to session    Ambulation/Gait               General Gait Details: deferred due to suspected orthostasis and patient request to eat breakfast  Stairs            Wheelchair Mobility    Modified Rankin (Stroke Patients Only)       Balance Overall balance assessment: Needs assistance Sitting-balance support: Feet supported;No upper extremity supported Sitting balance-Leahy Scale: Good                                       Pertinent Vitals/Pain Pain Assessment: Faces Faces Pain Scale: Hurts even more Pain Location: L knee Pain Descriptors / Indicators: Aching;Grimacing;Guarding Pain Intervention(s): Limited activity within patient's tolerance;Monitored during session;Premedicated before session;Repositioned    Home Living Family/patient expects to be discharged to:: Private residence Living Arrangements:  Children;Spouse/significant other   Type of Home: House Home Access: Stairs to enter   [employer] of Steps: 1   Home Layout: One level Home Equipment: Conservation officer, nature (2 wheels)      Prior Function Prior Level of Function : Independent/Modified Independent             Mobility Comments: Indep with ADLs,  household and community mobilization without assist device; + driving; denies fall history outside of this admission.       Hand Dominance        Extremity/Trunk Assessment   Upper Extremity Assessment Upper Extremity Assessment: Overall WFL for tasks assessed    Lower Extremity Assessment Lower Extremity Assessment: Generalized weakness (grossly 3-/5 throughout L knee; fair/good L quad activation and isolated movement/control)       Communication   Communication: No difficulties  Cognition Arousal/Alertness: Awake/alert Behavior During Therapy: WFL for tasks assessed/performed Overall Cognitive Status: Within Functional Limits for tasks assessed                                          General Comments      Exercises Total Joint Exercises Goniometric ROM: L knee: 3-75 degrees, limited by pain and post-op dressing Other Exercises Other Exercises: Seated L LE therex, 1x10, act assist ROM: ankle pumps, quad sets, LAQs, assisted knee flexion, marching.  Fair quad activation with isolated therex; end-ranges limited by pain   Assessment/Plan    PT Assessment Patient needs continued PT services  PT Problem List Decreased strength;Decreased range of motion;Decreased activity tolerance;Decreased balance;Decreased mobility;Decreased cognition;Decreased knowledge of use of DME;Decreased safety awareness;Decreased knowledge of precautions;Pain       PT Treatment Interventions DME instruction;Gait training;Stair training;Functional mobility training;Therapeutic activities;Therapeutic exercise;Patient/family education;Balance training    PT Goals (Current goals can be found in the Care Plan section)  Acute Rehab PT Goals Patient Stated Goal: to get mobility and indep back PT Goal Formulation: With patient/family Time For Goal Achievement: 05/22/21 Potential to Achieve Goals: Good    Frequency BID   Barriers to discharge Decreased caregiver support       Co-evaluation               AM-PAC PT "6 Clicks" Mobility  Outcome Measure Help needed turning from your back to your side while in a flat bed without using bedrails?: None Help needed moving from lying on your back to sitting on the side of a flat bed without using bedrails?: A Lot Help needed moving to and from a bed to a chair (including a wheelchair)?: A Lot Help needed standing up from a chair using your arms (e.g., wheelchair or bedside chair)?: A Lot Help needed to walk in hospital room?: A Lot Help needed climbing 3-5 steps with a railing? : A Lot 6 Click Score: 14    End of Session   Activity Tolerance: Patient tolerated treatment well (do suspect some degree of orthostasis; will formally assess in PM) Patient left: in chair;with call bell/phone within reach;with chair alarm set Nurse Communication: Mobility status PT Visit Diagnosis: Muscle weakness (generalized) (M62.81);Other abnormalities of gait and mobility (R26.89);Pain Pain - Right/Left: Left Pain - part of body: Knee    Time: 3382-5053 PT Time Calculation (min) (ACUTE ONLY): 20 min   Charges:   PT Evaluation $PT Eval Moderate Complexity: 1 Mod PT Treatments $Therapeutic Exercise: 8-22 mins  Azarya Oconnell H. Owens Shark, PT, DPT, NCS 05/08/21, 10:58 AM [phone]

## 2021-05-08 NOTE — Evaluation (Signed)
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   Clinical Impression   Chart reviewed, RN cleared pt for participation in OT evaluation. Pt is alert and oriented x4, son present for evaluation. Pt reports prior to fall she was MOD I-I in all ADL/IADL, following fall and prior to surgery she required assist for all mobility, ADL/IADL. At evaluation pt performed supine>sit with MIN A with HOB raised, STS with MIN-MOD A, SPT to bedside chair with MIN -MOD A, frequent vcs for PWBing precautions. SET UP for grooming tasks in sitting required, MIN A for UB dressing, MAX A for LB dressing. Pt is left in care of PT, NAD, all needs met. Pt will benefit from STR to address functional deficits, facilitate return to PLOF. OT will continue to follow.      Recommendations for follow up therapy are one component of a multi-disciplinary discharge planning process, led by the attending physician.  Recommendations may be updated based on patient status, additional functional criteria and insurance authorization.   Follow Up Recommendations  Skilled nursing-short term rehab (<3 hours/day)    Assistance Recommended at Discharge Frequent or constant Supervision/Assistance  Functional Status Assessment  Patient has had a recent decline in their functional status and demonstrates the ability to make significant improvements in function in a reasonable and predictable amount of time.  Equipment Recommendations  BSC/3in1    Recommendations for Other Services       Precautions / Restrictions Precautions Precautions: Fall Restrictions Weight Bearing Restrictions: Yes LLE Weight Bearing: Partial weight bearing      Mobility Bed Mobility Overal bed mobility: Needs Assistance Bed Mobility: Supine to Sit      Supine to sit: Min assist;HOB elevated     General bed mobility comments: seated in recliner beginning/end of treatment session    Transfers Overall transfer level: Needs assistance Equipment used: Rolling walker (2 wheels) Transfers: Sit to/from Stand;Bed to chair/wheelchair/BSC Sit to Stand: Mod assist Stand pivot transfers: Min assist;Mod assist         General transfer comment: frequent vcs for PWBing      Balance Overall balance assessment: Needs assistance Sitting-balance support: Feet supported;No upper extremity supported Sitting balance-Leahy Scale: Good     Standing balance support: Bilateral upper extremity supported;During functional activity;Reliant on assistive device for balance Standing balance-Leahy Scale: Poor                             ADL either performed or assessed with clinical judgement   ADL Overall ADL's : Needs assistance/impaired Eating/Feeding: Set up   Grooming: Wash/dry hands;Wash/dry face;Sitting;Set up           Upper Body Dressing : Moderate assistance;Sitting   Lower Body Dressing: Maximal assistance;Sitting/lateral leans   Toilet Transfer: Minimal assistance;Moderate assistance Toilet Transfer Details (indicate cue type and reason): simulated to bedside chair         Functional mobility during ADLs: Minimal assistance;Moderate assistance;Rolling walker (2 wheels)       Vision Patient Visual Report: No change from baseline       Perception     Praxis      Pertinent Vitals/Pain Pain Assessment: 0-10 Pain Score: 4  Faces Pain Scale: Hurts even more Pain Location: L knee Pain Descriptors / Indicators: Aching;Grimacing;Guarding Pain Intervention(s): Limited activity  within patient's tolerance;Premedicated before session;Relaxation;Monitored during session     Hand Dominance     Extremity/Trunk Assessment Upper Extremity Assessment Upper Extremity Assessment: Overall WFL for tasks assessed    Lower Extremity Assessment Lower Extremity Assessment: Generalized weakness       Communication Communication Communication: No difficulties   Cognition Arousal/Alertness: Awake/alert Behavior During Therapy: WFL for tasks assessed/performed Overall Cognitive Status: Within Functional Limits for tasks assessed                                 General Comments: alert and oriented x4     General Comments  performed SLR prior to OOB mobility; pt reports of dizziness upon sitting up; symptoms resolved within 30 seconds with no further c/o dizziness    Exercises Total Joint Exercises Goniometric ROM: L knee: 3-75 degrees, limited by pain and post-op dressing Other Exercises Other Exercises: Seated L LE therex, 1x10, act assist ROM: ankle pumps, quad sets, LAQs, assisted knee flexion, marching.  Fair quad activation with isolated therex; end-ranges limited by pain   Shoulder Instructions      Home Living Family/patient expects to be discharged to:: Private residence Living Arrangements: Children Available Help at Discharge: Family;Available PRN/intermittently Type of Home: House Home Access: Stairs to enter [employer] of Steps: 1   Home Layout: One level     Bathroom Shower/Tub: Teacher, early years/pre: Standard     Home Equipment: Conservation officer, nature (2 wheels);Shower seat          Prior Functioning/Environment Prior Level of Function : Independent/Modified Independent             Mobility Comments: indep with home and community mobility without AD prior to fall ADLs Comments: mod I-I with ADL/IADL prior to fall        OT Problem List: Decreased strength;Impaired balance (sitting and/or standing);Decreased knowledge of precautions;Decreased activity tolerance;Decreased knowledge of use of DME or AE      OT Treatment/Interventions: Self-care/ADL training;DME and/or AE instruction;Therapeutic activities;Balance  training;Therapeutic exercise;Patient/family education    OT Goals(Current goals can be found in the care plan section) Acute Rehab OT Goals Patient Stated Goal: to get better OT Goal Formulation: With patient Time For Goal Achievement: 05/22/21 Potential to Achieve Goals: Good ADL Goals Pt Will Perform Grooming: with modified independence;standing Pt Will Perform Lower Body Dressing: with modified independence;with adaptive equipment Pt Will Transfer to Toilet: with modified independence Pt Will Perform Toileting - Clothing Manipulation and hygiene: with modified independence;sit to/from stand  OT Frequency: Min 2X/week   Barriers to D/C: Decreased caregiver support          Co-evaluation              AM-PAC OT "6 Clicks" Daily Activity     Outcome Measure Help from another person eating meals?: None Help from another person taking care of personal grooming?: A Little Help from another person toileting, which includes using toliet, bedpan, or urinal?: A Lot Help from another person bathing (including washing, rinsing, drying)?: A Lot Help from another person to put on and taking off regular upper body clothing?: A Little Help from another person to put on and taking off regular lower body clothing?: A Lot 6 Click Score: 16   End of Session Equipment Utilized During Treatment: Gait belt;Rolling walker (2 wheels) Nurse Communication: Mobility status  Activity Tolerance: Patient tolerated treatment well Patient left: in chair;with call bell/phone  within reach;with chair alarm set;with family/visitor present;Other (comment) (PT present)  OT Visit Diagnosis: Unsteadiness on feet (R26.81);Other abnormalities of gait and mobility (R26.89);History of falling (Z91.81)                Time: 5300-5110 OT Time Calculation (min): 22 min Charges:  OT General Charges $OT Visit: 1 Visit OT Evaluation $OT Eval Low Complexity: 1 Low OT Treatments $Self Care/Home Management : 8-22  mins  Shanon Payor, OTD OTR/L  05/08/21, 12:42 PM

## 2021-05-08 NOTE — Progress Notes (Signed)
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    PT Comments    Pt was long sitting in bed, lethargic, but comfortable in bed. She was issued pain meds ~ 30 minutes prior. Is A and O x 4 and able to appropriately answer all questions when asked.  Endorses no pain now after taking pain meds prior. BP in [id number]/99(24). Upon sitting up EOB, BP 100/47 (63). She c/o minimal dizziness however does endorse severe nausea. Begins dry heaving and requested to return to bed. Wash cloth and anti-nausea meds issued by RN. Author elected to issue HEP and discuss the importance of performing ROM, strengthening, and OOB mobility as able. She states understanding. Acute PT will return in the morning to continue to progress pt towards PLOF. SNF [facility] [place] seems most appropriate due to limited progress made this session. She was repositioned in bed with polar care in place, L knee in extension resting on towel roll, and pt's call bell in reach.    Recommendations for follow up therapy are one component of a multi-disciplinary discharge planning process, led by the attending physician.  Recommendations may be updated based on patient status, additional functional criteria and insurance authorization.  Follow Up Recommendations  Skilled nursing-short term rehab (<3 hours/day)     Assistance Recommended at Discharge Frequent or constant Supervision/Assistance  Equipment Recommendations  Rolling walker (2 wheels);BSC/3in1       Precautions / Restrictions Precautions Precautions: Fall Restrictions Weight Bearing Restrictions: Yes LLE Weight Bearing: Partial weight bearing     Mobility  Bed Mobility Overal bed mobility: Needs Assistance Bed Mobility: Supine to Sit     Supine to sit: Min assist;Mod assist (HOB  slightly elevated) Sit to supine: Mod assist;Max assist;HOB elevated   General bed mobility comments: Pt was very lethargic at first however was willing to fully participate and does respond to all questions appropriately. BP in supine 112/45 (66). Upon sitting up EOB 100/47 (63). Does c/o of minimal dizziness. More limited by nausea. Starts dry heaving at EOB. RN notified and issed nausea meds. pt returned to bed and author issued HEP.    Transfers Overall transfer level: Needs assistance Equipment used: Rolling walker (2 wheels) Transfers: Sit to/from Stand;Bed to chair/wheelchair/BSC Sit to Stand: Mod assist Stand pivot transfers: Min assist;Mod assist         General transfer comment: pt's lethargy and nausea greatly limited session. Pt was educated on importance of performing ROM, strengthening, and continueing to get OOB as much as able.    Ambulation/Gait      General Gait Details: deferred due to suspected orthostasis and patient request to eat breakfast    Balance Overall balance assessment: Needs assistance Sitting-balance support: Feet supported;No upper extremity supported Sitting balance-Leahy Scale: Good     Standing balance support: Bilateral upper extremity supported;During functional activity;Reliant on assistive device for balance Standing balance-Leahy Scale: Poor       Cognition Arousal/Alertness: Awake/alert Behavior During Therapy: WFL for tasks assessed/performed Overall Cognitive Status: Within Functional Limits for tasks assessed      General Comments: Alert and Oriented x4. lethargic due to recent pain meds issued        Exercises Total Joint Exercises Goniometric ROM: L knee: 3-75 degrees, limited by pain and post-op dressing Other Exercises Other Exercises: Seated L LE therex, 1x10, act assist  ROM: ankle pumps, quad sets, LAQs, assisted knee flexion, marching.  Fair quad activation with isolated therex; end-ranges limited by pain     General Comments General comments (skin integrity, edema, etc.): performed SLR prior to OOB mobility; pt reports of dizziness upon sitting up; symptoms resolved within 30 seconds with no further c/o dizziness      Pertinent Vitals/Pain Pain Assessment: No/denies pain Pain Score: 0-No pain Faces Pain Scale: Hurts even more Pain Location: L knee Pain Descriptors / Indicators: Aching;Grimacing;Guarding Pain Intervention(s): Limited activity within patient's tolerance;Monitored during session;Repositioned;Premedicated before session    Home Living Family/patient expects to be discharged to:: Private residence Living Arrangements: Children Available Help at Discharge: Family;Available PRN/intermittently Type of Home: House Home Access: Stairs to enter   Entrance Stairs-Number of Steps: 1   Home Layout: One level Home Equipment: Conservation officer, nature (2 wheels);Shower seat          PT Goals (current goals can now be found in the care plan section) Acute Rehab PT Goals Patient Stated Goal: return to complete independence PT Goal Formulation: With patient/family Time For Goal Achievement: 05/22/21 Potential to Achieve Goals: Good Progress towards PT goals: Progressing toward goals    Frequency    BID      PT Plan Current plan remains appropriate       AM-PAC PT "6 Clicks" Mobility   Outcome Measure  Help needed turning from your back to your side while in a flat bed without using bedrails?: None Help needed moving from lying on your back to sitting on the side of a flat bed without using bedrails?: A Lot Help needed moving to and from a bed to a chair (including a wheelchair)?: A Lot Help needed standing up from a chair using your arms (e.g., wheelchair or bedside chair)?: A Lot Help needed to walk in hospital room?: A Lot Help needed climbing 3-5 steps with a railing? : A Lot 6 Click Score: 14    End of Session Equipment Utilized During Treatment: Gait belt Activity  Tolerance: Patient limited by lethargy;Other (comment) (limited by nausea/vomiting) Patient left: in bed;with call bell/phone within reach;with bed alarm set;with nursing/sitter in room Nurse Communication: Mobility status PT Visit Diagnosis: Muscle weakness (generalized) (M62.81);Other abnormalities of gait and mobility (R26.89);Pain Pain - Right/Left: Left Pain - part of body: Knee     Time: 1301-1330 PT Time Calculation (min) (ACUTE ONLY): 29 min  Charges:  $Therapeutic Exercise: 8-22 mins $Therapeutic Activity: 8-22 mins                     Julaine Fusi PTA 05/08/21, 1:52 PM

## 2021-05-08 NOTE — Discharge Summary (Incomplete Revision)
Physician Discharge Summary  Patient ID: SYDNEI OHAVER MRN: [id number] DOB/AGE: 13-Jul-1940 80 y.o.  Admit date: 05/07/2021 Discharge date: 05/12/2021  Admission Diagnoses:  PROCEDURE:  Left total knee revision arthroplasty    SURGEON:  Marciano Sequin. M.D.   ASSISTANT: Cassell Smiles, PA-C (present and scrubbed throughout the case, critical for assistance with exposure, retraction, instrumentation, and closure)   ANESTHESIA: general   ESTIMATED BLOOD LOSS: 100 mL   FLUIDS REPLACED: 1100 mL of crystalloid   TOURNIQUET TIME: 90 minutes   DRAINS: none   IMPLANTS UTILIZED: DePuy small S-ROM Noiles rotating hinge femoral component, 46 mm fully porous-coated universal femoral sleeve, 75 mm x 16 mm universal fluted stem, size 3 MBT revision rotating platform tibial component (cemented), 53 mm MBT revision porous metaphyseal sleeve, 75 mm x 12 mm universal fluted stem, and a 12 mm LPS small hinge polyethylene insert. Surgeries:Procedure(s): TOTAL KNEE REVISION on 05/07/2021  Discharge Diagnoses: Patient Active Problem List   Diagnosis Date Noted   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   Periprosthetic fracture around internal prosthetic left knee joint 05/05/2021   Idiopathic gout, unspecified site 11/20/2020   Itching 11/20/2020   Anemia in chronic kidney disease 10/16/2020   Heart failure, unspecified ([place]) 10/16/2020   Hyperparathyroidism due to renal insufficiency ([place]) 10/16/2020   Proteinuria, unspecified 10/16/2020   Chronic kidney disease, stage 4 (severe) ([place]) 06/03/2020   Liver cirrhosis secondary to NASH (nonalcoholic steatohepatitis) ([place]) 01/22/2020   Arthritis of left shoulder region 01/06/2020   Iron deficiency 01/06/2020   Chest pain 01/05/2020   AKI (acute kidney injury) ([place])    Essential hypertension    OSA on CPAP 09/05/2019   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   Hypercalcemia 02/13/2019   Iron deficiency anemia 06/30/2018   Acute respiratory failure with  hypoxia ([place]) 03/23/2018   Hyperlipidemia due to type 2 diabetes mellitus ([place]) 04/09/2014   Type 2 diabetes mellitus with diabetic chronic kidney disease ([place]) 03/04/2014   Adrenal nodule ([place]) 11/28/2013   B12 deficiency 11/28/2013   CAD (coronary artery disease), autologous vein bypass graft 08/05/2013   DDD (degenerative disc disease), lumbosacral 08/05/2013   Headache 08/05/2013    Past Medical History:  Diagnosis Date   Anginal pain (HCC)    Arthritis    B12 deficiency anemia    Cervical disc disease    Cervical disc disease    Chest pain    Cholecystolithiasis    Chronic kidney disease    Cirrhosis of liver ([place]) 11/2019   Colon polyp    Coronary artery disease    Cutaneous sarcoidosis    Diabetes mellitus without complication ([place])    Pt takes insulin   Diabetic neuropathy (HCC)    GERD (gastroesophageal reflux disease)    Headache    migaines in the past   Hemorrhoid    History of diabetic neuropathy    Hypercalcemia    Hyperlipemia    Hypertension    IBS (irritable bowel syndrome)    Iron deficiency anemia    Myocardial infarction ([place])    Pneumonia    Sleep apnea      Transfusion:    Consultants (if any):   Discharged Condition: Improved  Hospital Course: Rhonda Tyler is an 80 y.o. female who was admitted 05/07/2021 with a diagnosis of left knee periprosthetic fracture and went to the operating room on 05/07/2021 and underwent revision of left knee total arthroplasty. The patient received perioperative antibiotics for prophylaxis (see below). The patient tolerated  the procedure well and was transported to PACU in stable condition. After meeting PACU criteria, the patient was subsequently transferred to the Orthopaedics/Rehabilitation unit.   The patient received DVT prophylaxis in the form of early mobilization, Lovenox, Foot Pumps, and TED hose. A sacral pad had been placed and heels were elevated off of the bed with rolled towels in order to protect  skin integrity. Foley catheter was discontinued on postoperative day #0. Wound drains were discontinued on postoperative day #2. The surgical incision was healing well without signs of infection.  Physical therapy was initiated postoperatively for transfers, gait training, and strengthening. Occupational therapy was initiated for activities of daily living and evaluation for assisted devices. Rehabilitation goals were reviewed in detail with the patient. The patient made steady progress with physical therapy and physical therapy recommended discharge to Rehab.   Patient did develop mild cough and congestion on postop day 3 and 4.  Chest x-ray negative.  Patient with acute postop blood loss anemia with underlying chronic anemia.  Patient on iron supplement.  Hemoglobin stable around 7.7 on postop day 4 and 7.5 on postop day 5.  The patient achieved the preliminary goals of this hospitalization and was felt to be medically and orthopaedically appropriate for discharge.  She was given perioperative antibiotics:  Anti-infectives (From admission, onward)    Start     Dose/Rate Route Frequency Ordered Stop   05/07/21 2300  ceFAZolin (ANCEF) IVPB 2g/100 mL premix        2 g 200 mL/hr over 30 Minutes Intravenous Every 6 hours 05/07/21 1946 05/08/21 0546   05/07/21 1005  ceFAZolin (ANCEF) 2-4 GM/100ML-% IVPB       Note to Pharmacy: Maryagnes Amos B: cabinet override      05/07/21 1005 05/08/21 0055   05/07/21 0600  ceFAZolin (ANCEF) IVPB 2g/100 mL premix        2 g 200 mL/hr over 30 Minutes Intravenous On call to O.R. 05/07/21 4098 05/07/21 1630     .  Recent vital signs:  Vitals:   05/09/21 2345 05/10/21 0436  BP:  (!) 136/52  Pulse:  88  Resp:  16  Temp:  98.3 F (36.8 C)  SpO2: 93% 94%    Recent laboratory studies:  No results for input(s): WBC, HGB, HCT, PLT, K, CL, CO2, BUN, CREATININE, GLUCOSE, CALCIUM, LABPT, INR in the last 72 hours.  Diagnostic Studies: CT HEAD WO CONTRAST  (5MM)  Result Date: 04/21/2021 CLINICAL DATA:  Head trauma, moderate-severe; Neck trauma, intoxicated or obtunded (Age >= 16y) EXAM: CT HEAD WITHOUT CONTRAST CT CERVICAL SPINE WITHOUT CONTRAST TECHNIQUE: Multidetector CT imaging of the head and cervical spine was performed following the standard protocol without intravenous contrast. Multiplanar CT image reconstructions of the cervical spine were also generated. COMPARISON:  MRI head April 05, 2021.  CT head 01/28/2011. FINDINGS: CT HEAD FINDINGS Brain: No evidence of acute large vascular territory infarct, acute hemorrhage, abnormal mass effect, or extra-axial fluid collection. 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 Vascular: Calcific intracranial atherosclerosis. No hyperdense vessel identified. Skull: No acute fracture. Sinuses/Orbits: Clear sinuses.  Unremarkable orbits. Other: No mastoid effusions. CT CERVICAL SPINE FINDINGS Alignment: Straightening of the normal cervical lordosis. No substantial sagittal subluxation. Skull base and vertebrae: C3-T1 corpectomy and anterior plate and screw fusion. Solid osseous fusion across the disc spaces spanning C3-C6. No evidence of osseous fusion across the C7-T1 disc space. No evidence of hardware fracture. Soft tissues and  spinal canal: No prevertebral fluid or swelling. No visible canal hematoma. Disc levels: C7-T1 degenerative disc disease with disc height loss, endplate sclerosis. Multilevel facet/uncovertebral hypertrophy with varying degrees of neural foraminal stenosis. Diffuse osteopenia. Upper chest: Multiple 2-3 mm pulmonary nodules in bilateral upper lobes. Other: Subcentimeter right thyroid nodule, which does not require further imaging follow-up (ref: J Am Coll Radiol. 2015 Feb;12(2): 143-50). IMPRESSION: CT head: 1. No evidence of acute intracranial abnormality. 2. 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 CT cervical spine: 1. No evidence of acute fracture or traumatic malalignment. 2. C3-T1 corpectomy and anterior fusion. Solid osseous fusion across the C3-C7 disc spaces without evidence of fusion across the C7-T1 disc space. 3. Multiple 2-3 mm pulmonary nodules in bilateral upper lobes. No follow-up needed if patient is low-risk (and has no known or suspected primary neoplasm). Non-contrast chest CT can be considered in 12 months if patient is high-risk. This recommendation follows the consensus statement: Guidelines for Management of Incidental Pulmonary Nodules Detected on CT Images: From the Fleischner Society 2017; Radiology 2017; 284:228-243. Electronically Signed   By: Margaretha Sheffield M.D.   On: 04/21/2021 18:36   CT Cervical Spine Wo Contrast  Result Date: 04/21/2021 CLINICAL DATA:  Head trauma, moderate-severe; Neck trauma, intoxicated or obtunded (Age >= 16y) EXAM: CT HEAD WITHOUT CONTRAST CT CERVICAL SPINE WITHOUT CONTRAST TECHNIQUE: Multidetector CT imaging of the head and cervical spine was performed following the standard protocol without intravenous contrast. Multiplanar CT image reconstructions of the cervical spine were also generated. COMPARISON:  MRI head April 05, 2021.  CT head 01/28/2011. FINDINGS: CT HEAD FINDINGS Brain: No evidence of acute large vascular territory infarct, acute hemorrhage, abnormal mass effect, or extra-axial fluid collection. 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 Vascular: Calcific intracranial atherosclerosis. No hyperdense vessel identified. Skull: No acute fracture. Sinuses/Orbits: Clear sinuses.  Unremarkable orbits. Other: No mastoid effusions. CT CERVICAL SPINE FINDINGS Alignment: Straightening of the normal cervical lordosis. No substantial sagittal subluxation. Skull base and vertebrae: C3-T1 corpectomy and anterior plate and screw fusion. Solid osseous fusion  across the disc spaces spanning C3-C6. No evidence of osseous fusion across the C7-T1 disc space. No evidence of hardware fracture. Soft tissues and spinal canal: No prevertebral fluid or swelling. No visible canal hematoma. Disc levels: C7-T1 degenerative disc disease with disc height loss, endplate sclerosis. Multilevel facet/uncovertebral hypertrophy with varying degrees of neural foraminal stenosis. Diffuse osteopenia. Upper chest: Multiple 2-3 mm pulmonary nodules in bilateral upper lobes. Other: Subcentimeter right thyroid nodule, which does not require further imaging follow-up (ref: J Am Coll Radiol. 2015 Feb;12(2): 143-50). IMPRESSION: CT head: 1. No evidence of acute intracranial abnormality. 2. 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 CT cervical spine: 1. No evidence of acute fracture or traumatic malalignment. 2. C3-T1 corpectomy and anterior fusion. Solid osseous fusion across the C3-C7 disc spaces without evidence of fusion across the C7-T1 disc space. 3. Multiple 2-3 mm pulmonary nodules in bilateral upper lobes. No follow-up needed if patient is low-risk (and has no known or suspected primary neoplasm). Non-contrast chest CT can be considered in 12 months if patient is high-risk. This recommendation follows the consensus statement: Guidelines for Management of Incidental Pulmonary Nodules Detected on CT Images: From the Fleischner Society 2017; Radiology 2017; 284:228-243. Electronically Signed   By: Margaretha Sheffield M.D.   On: 04/21/2021 18:36  US Abdomen Complete  Result Date: 04/18/2021 CLINICAL DATA:  [facility] surveillance.  Cirrhosis of liver with ascites. EXAM: ABDOMEN ULTRASOUND COMPLETE COMPARISON:  Abdominal MRI 11/24/2020. abdominal ultrasound 04/22/2020 FINDINGS: Gallbladder: Surgically absent. Common bile duct: Diameter: 6-7 mm, normal for postcholecystectomy. Liver: Heterogeneous hepatic parenchymal echogenicity, borderline  increased compared to right kidney. Nodular hepatic contours. No focal hepatic lesion. Portal vein is patent on color Doppler imaging with normal direction of blood flow towards the liver. IVC: No abnormality visualized. Pancreas: Visualized portion unremarkable, majority is obscured by bowel gas. Spleen: Size and appearance within normal limits. Greatest splenic length is 11.9 cm, splenic volume is 291 cc. Right Kidney: Length: 9.4 cm. Normal parenchymal echogenicity. No hydronephrosis. No visualized stone or focal lesion. Left Kidney: Length: 9.1 cm. No hydronephrosis. Similar subcentimeter hypoechoic focus in the mid kidney. Otherwise normal parenchymal echogenicity. Abdominal aorta: No aneurysm visualized. Other findings: No abdominal ascites. IMPRESSION: 1. Hepatic cirrhosis. No focal hepatic lesion. No abdominal ascites. 2. No splenomegaly. 3. 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 Electronically Signed   By: Keith Rake M.D.   On: 04/18/2021 12:19   DG Knee Complete 4 Views Left  Addendum Date: 05/02/2021   ADDENDUM REPORT: 05/02/2021 08:42 ADDENDUM: On further review there is suggestion of low prosthetic fracture. Diffuse osteopenia heterotopic ossification about the knee joint. Electronically Signed   By: Keane Police D.O.   On: 05/02/2021 08:42   Result Date: 05/02/2021 CLINICAL DATA:  Knee pain, fall. EXAM: LEFT KNEE - COMPLETE 4+ VIEW COMPARISON:  None. FINDINGS: Status post left knee arthroplasty. No perihardware loosening or fracture. No appreciable joint effusion. IMPRESSION: Status post left knee arthroplasty without evidence of acute osseous abnormality. Electronically Signed: By: Keane Police D.O. On: 04/21/2021 18:02   DG Knee Left Port  Result Date: 05/07/2021 CLINICAL DATA:  Postop knee replacement EXAM: PORTABLE LEFT KNEE - 1-2 VIEW COMPARISON:  April 21, 2021 FINDINGS: Status post 3 component long-stem total knee  arthroplasty, with a minimally displaced fracture of the medial femoral condyle and near anatomic postsurgical alignment. Cutaneous skin staples and subcutaneous edema/gas reflect postoperative change. Diffuse demineralization of bone. IMPRESSION: Postsurgical change of revision 3 component long-stem total knee arthroplasty with a minimally displaced fracture of the medial femoral condyle and near anatomic postsurgical alignment. Electronically Signed   By: Dahlia Bailiff M.D.   On: 05/07/2021 18:25    Discharge Medications:   Allergies as of 05/10/2021       Reactions   Penicillins Hives   Codeine Other (See Comments)   Hallucinate   Levaquin [levofloxacin In D5w] Other (See Comments)   Unknown   Lipitor [atorvastatin Calcium] Other (See Comments)   Muscle pain   Lopressor [metoprolol Tartrate] Other (See Comments)   Heart races   Potassium-containing Compounds Other (See Comments)   Unknown   Procardia [nifedipine] Other (See Comments)   Heart races   Sucralfate Nausea Only   Tramadol Other (See Comments)   Confusion   Allegra [fexofenadine] Rash   Naltrexone Other (See Comments)   Severe headache, nausea, "body flashes".   Sulfa Antibiotics Rash        Medication List     STOP taking these medications    aspirin EC 81 MG tablet       TAKE these medications    acetaminophen 500 MG tablet Commonly known as: TYLENOL Take 500-1,000 mg by mouth every 4 (four) hours as needed for mild pain or fever.   B-12  2500 MCG Tabs Take 2,500 mcg by mouth daily.   cholecalciferol 25 MCG (1000 UNIT) tablet Commonly known as: VITAMIN D3 Take 1,000 Units by mouth daily.   enoxaparin 40 MG/0.4ML injection Commonly known as: LOVENOX Inject 0.4 mLs (40 mg total) into the skin daily for 14 days.   fenofibrate 160 MG tablet Take 160 mg by mouth daily.   furosemide 40 MG tablet Commonly known as: LASIX Take 40 mg by mouth daily.   HYDROcodone-acetaminophen 5-325 MG  tablet Commonly known as: NORCO/VICODIN Take 1-2 tablets by mouth every 6 (six) hours as needed for pain.   hydroxychloroquine 200 MG tablet Commonly known as: PLAQUENIL Take 200 mg by mouth daily.   hydrOXYzine 25 MG tablet Commonly known as: ATARAX Take 25 mg by mouth daily.   insulin NPH-regular Human (70-30) 100 UNIT/ML injection Inject 20 Units into the skin 2 (two) times daily with a meal.   insulin regular 100 units/mL injection Commonly known as: NOVOLIN R Inject 0-10 Units into the skin daily before lunch. Dose per sliding scale.   lisinopril 5 MG tablet Commonly known as: ZESTRIL Take 5 mg by mouth daily.   loratadine 10 MG tablet Commonly known as: CLARITIN Take 10 mg by mouth daily.   magnesium oxide 400 (240 Mg) MG tablet Commonly known as: MAG-OX Take 400 mg by mouth 2 (two) times daily.   meclizine 25 MG tablet Commonly known as: ANTIVERT Take 25 mg by mouth every 4 (four) hours as needed for dizziness.   multivitamin with minerals Tabs tablet Take 1 tablet by mouth at bedtime.   nitroGLYCERIN 0.4 MG SL tablet Commonly known as: NITROSTAT Place 0.4 mg under the tongue every 5 (five) minutes as needed for chest pain.   ondansetron 4 MG tablet Commonly known as: ZOFRAN Take 4 mg by mouth every 8 (eight) hours as needed for vomiting or nausea.   oxyCODONE 5 MG immediate release tablet Commonly known as: Oxy IR/ROXICODONE Take 1 tablet (5 mg total) by mouth every 4 (four) hours as needed for severe pain.   pantoprazole 40 MG tablet Commonly known as: PROTONIX Take 40 mg by mouth 2 (two) times daily.   propranolol ER 120 MG 24 hr capsule Commonly known as: INDERAL LA Take 120 mg by mouth daily.   sertraline 100 MG tablet Commonly known as: ZOLOFT Take 100 mg by mouth daily.   spironolactone 50 MG tablet Commonly known as: ALDACTONE Take 50 mg by mouth daily.               Durable Medical Equipment  (From admission, onward)            Start     Ordered   05/07/21 1947  DME Walker rolling  Once       Question:  Patient needs a walker to treat with the following condition  Answer:  Total knee replacement status   05/07/21 1946   05/07/21 1947  DME Bedside commode  Once       Question:  Patient needs a bedside commode to treat with the following condition  Answer:  Total knee replacement status   05/07/21 1946            Disposition: Skilled nursing facility     Follow-up Information     Fausto Skillern, PA-C Follow up on 05/22/2021.   Specialty: Orthopedic Surgery Why: at 1:15pm Contact information: [address] [place] [address] [phone]  Dereck Leep, MD Follow up on 06/19/2021.   Specialty: Orthopedic Surgery Why: at 2:00pm Contact information: [address] [place] [address] [place], PA-C 05/10/2021, 7:25 AM

## 2021-05-09 LAB — GLUCOSE, CAPILLARY
Glucose-Capillary: 378 mg/dL — ABNORMAL HIGH (ref 70–99)
Glucose-Capillary: 360 mg/dL — ABNORMAL HIGH (ref 70–99)
Glucose-Capillary: 344 mg/dL — ABNORMAL HIGH (ref 70–99)
Glucose-Capillary: 383 mg/dL — ABNORMAL HIGH (ref 70–99)

## 2021-05-09 MED ORDER — INSULIN ASPART PROT & ASPART (70-30 MIX) 100 UNIT/ML ~~LOC~~ SUSP
8.0000 [IU] | Freq: Two times a day (BID) | SUBCUTANEOUS | Status: DC
  Administered 2021-05-09 – 2021-05-12 (×6): 8 [IU] via SUBCUTANEOUS
  Filled 2021-05-09 (×2): qty 10

## 2021-05-09 MED ORDER — INFLUENZA VAC A&B SA ADJ QUAD 0.5 ML IM PRSY
0.5000 mL | PREFILLED_SYRINGE | INTRAMUSCULAR | Status: DC
  Filled 2021-05-09: qty 0.5

## 2021-05-09 NOTE — Progress Notes (Signed)
Subjective: 2 Days Post-Op Procedure(s) (LRB): TOTAL KNEE REVISION (Left) Patient reports pain as moderate today. Patient is well, and has had no acute complaints or problems Current plan is for discharge to rehab when approved. Negative for chest pain and shortness of breath Fever: no Gastrointestinal:Negative for nausea and vomiting Patient states that she is passing gas this morning.  Objective: Vital signs in last 24 hours: Temp:  [97.6 F (36.4 C)-98.7 F (37.1 C)] 97.9 F (36.6 C) (12/30 1108) Pulse Rate:  [63-83] 72 (12/30 1108) Resp:  [16-18] 17 (12/30 1108) BP: (107-155)/(40-78) 124/49 (12/30 1108) SpO2:  [98 %-100 %] 100 % (12/30 1108)  Intake/Output from previous day:  Intake/Output Summary (Last 24 hours) at 05/09/2021 1209 Last data filed at 05/09/2021 0715 Gross per 24 hour  Intake 170.19 ml  Output 450 ml  Net -279.81 ml    Intake/Output this shift: Total I/O In: -  Out: 250 [Urine:250]  Labs: No results for input(s): HGB in the last 72 hours. No results for input(s): WBC, RBC, HCT, PLT in the last 72 hours. No results for input(s): NA, K, CL, CO2, BUN, CREATININE, GLUCOSE, CALCIUM in the last 72 hours. No results for input(s): LABPT, INR in the last 72 hours.   EXAM General - Patient is Alert, Appropriate, and Oriented Extremity - ABD soft Neurovascular intact Dorsiflexion/Plantar flexion intact No cellulitis present Dressing/Incision - Bulky dressing removed from the left leg this morning.  Fresh honeycomb dressing applied. Motor Function - intact, moving foot and toes well on exam.  Abdomen soft with normal bowel sounds. Negative Homans to bilateral lower extremities.  Past Medical History:  Diagnosis Date   Anginal pain ([place])    Arthritis    B12 deficiency anemia    Cervical disc disease    Cervical disc disease    Chest pain    Cholecystolithiasis    Chronic kidney disease    Cirrhosis of liver ([place]) 11/2019   Colon polyp     Coronary artery disease    Cutaneous sarcoidosis    Diabetes mellitus without complication (HCC)    Pt takes insulin   Diabetic neuropathy (HCC)    GERD (gastroesophageal reflux disease)    Headache    migaines in the past   Hemorrhoid    History of diabetic neuropathy    Hypercalcemia    Hyperlipemia    Hypertension    IBS (irritable bowel syndrome)    Iron deficiency anemia    Myocardial infarction (HCC)    Pneumonia    Sleep apnea     Assessment/Plan: 2 Days Post-Op Procedure(s) (LRB): TOTAL KNEE REVISION (Left) Principal Problem:   S/P revision of total knee  Estimated body mass index is 28.87 kg/m as calculated from the following:   Height as of this encounter: 5\' 3"  (1.6 m).   Weight as of this encounter: 73.9 kg. Advance diet Up with therapy D/C IV fluids when tolerating po intake.  Vitals stable this morning. Bulky dressing removed and fresh honeycomb applied. Most recent glucose 360, insulin adjusted. Up with therapy today. Patient is passing gas, work on BM. Plan for d/c to SNF when appropriate.  DVT Prophylaxis - Lovenox and Foot Pumps Partial weightbearing to the left leg.  Raquel Ogden Handlin, PA-C [facility] 05/09/2021, 12:09 PM

## 2021-05-09 NOTE — Progress Notes (Signed)
PT Cancellation Note  Patient Details Name: Rhonda Tyler MRN: [id number] DOB: 27-Feb-1941   Cancelled Treatment:     PT attempt. Pt is very lethargic from pain medication issued not long ago. She requested not to get OOB but agrees to performing there ex. Pt falls asleep during first exercise. Acute PT will continue to follow and progress as able per current POC. Will return at a more appropriate time when pt is able to fully participate.    Willette Pa 05/09/2021, 2:46 PM

## 2021-05-09 NOTE — TOC Progression Note (Addendum)
Transition of Care [facility]) - Progression Note    Patient Details  Name: Rhonda Tyler MRN: [id number] Date of Birth: [birth year]  [facility] CM/SW [place], LCSW Phone Number: 05/09/2021, 10:30 AM  Clinical Narrative:  Only bed offers this morning are [facility] and [facility]. Sent updated therapy notes to facilities still pending to try and trigger more responses.   12:30 pm: No updated bed offers yet. Reviewed with patient. Her first preference is [facility] but they are not accepting Medicare waiver (would need if discharged before tomorrow). They also do not accept weekend admissions. Second preference is [facility]. Left message for admissions coordinator to see if they are accepting waiver.  3:09 pm: [facility] is reviewing referral. [facility] has offered a bed but cannot accept anyone until Monday due to [place].  Expected Discharge Plan: [place] Barriers to Discharge: Continued Medical Work up  Expected Discharge Plan and Services Expected Discharge Plan: [place] Choice: [place] arrangements for the past 2 months: Single Family Home                                       Social Determinants of Health (SDOH) Interventions    Readmission Risk Interventions No flowsheet data found.

## 2021-05-09 NOTE — Progress Notes (Signed)
Physical Therapy Treatment Patient Details Name: Rhonda Tyler MRN: [id number] DOB: 24-Apr-1941 Today's Date: 05/09/2021   History of Present Illness admitted for L TKR revision (05/07/21) secondary to peri-prosthetic distal femur fracture; PWB.    PT Comments    Pt was long sitting in bed upon arriving. She is much more alert this morning than yesterday afternoon. She is A and O x 4 and agreeable to OOB activity. Has been up with RN staff to [facility] recently. She endorses 7/10 pain however was willing to fully participate. She required assistance to exit bed, stand, and take ~ 5 steps to recliner. Pt did well adhering to PWB. Once in recliner, pt tolerated performing HEP and ROM activity. Was able per flex knee to ~ 88 degrees actively. At conclusion of session, pt was in recliner with towel roll in place, polar care running and pt resting comfortably. Will return later this date for 2nd/BID session. SNF still most appropriate at DC to continue skilled PT while assisting pt to PLOF.   Recommendations for follow up therapy are one component of a multi-disciplinary discharge planning process, led by the attending physician.  Recommendations may be updated based on patient status, additional functional criteria and insurance authorization.  Follow Up Recommendations  Skilled nursing-short term rehab (<3 hours/day)     Assistance Recommended at Discharge Frequent or constant Supervision/Assistance  Equipment Recommendations  Rolling walker (2 wheels);BSC/3in1       Precautions / Restrictions Precautions Precautions: Fall Restrictions Weight Bearing Restrictions: Yes LLE Weight Bearing: Partial weight bearing     Mobility  Bed Mobility Overal bed mobility: Needs Assistance Bed Mobility: Supine to Sit     Supine to sit: Min assist;Mod assist     General bed mobility comments: increased time to perform with Vcs for improved technique and sequencing throughout.    Transfers Overall  transfer level: Needs assistance Equipment used: Rolling walker (2 wheels) Transfers: Sit to/from Stand Sit to Stand: Min assist           General transfer comment: Pt was able to stand from EOB and from recliner with increased time and min assist. Vcs for handplacement and imporved fwd wt shift.    Ambulation/Gait Ambulation/Gait assistance: Min guard Gait Distance (Feet): 5 Feet Assistive device: Rolling walker (2 wheels) Gait Pattern/deviations: Step-to pattern;Antalgic;Trunk flexed Gait velocity: decreased     General Gait Details: Pt was able to ambulate ~ 5 ft with RW with extremely slow step to pattern. Did well with PWB but was pain limited    Balance Overall balance assessment: Needs assistance Sitting-balance support: Feet supported;No upper extremity supported Sitting balance-Leahy Scale: Good     Standing balance support: During functional activity;Reliant on assistive device for balance;Bilateral upper extremity supported Standing balance-Leahy Scale: Fair Standing balance comment: CGA for safety during standing activity      Cognition Arousal/Alertness: Awake/alert Behavior During Therapy: WFL for tasks assessed/performed Overall Cognitive Status: Within Functional Limits for tasks assessed      General Comments: Pt is A and O x 4. More alert today however does c/o of slightly increased pain. pain did not limit session progression        Exercises Total Joint Exercises Ankle Circles/Pumps: AROM;10 reps Quad Sets: AROM;10 reps Short Arc Quad: AROM;10 reps Heel Slides: AROM;10 reps Hip ABduction/ADduction: AROM;10 reps Straight Leg Raises: AROM;10 reps Knee Flexion: AROM;AAROM;5 reps Goniometric ROM: 88degrees flexion, lacks 2 degrees extension        Pertinent Vitals/Pain Pain Assessment: 0-10 Pain  Score: 7  Pain Location: L knee Pain Descriptors / Indicators: Aching;Grimacing;Guarding Pain Intervention(s): Limited activity within patient's  tolerance;Monitored during session;Premedicated before session;Repositioned;Ice applied     PT Goals (current goals can now be found in the care plan section) Acute Rehab PT Goals Patient Stated Goal: return to complete independence Progress towards PT goals: Progressing toward goals    Frequency    BID      PT Plan Current plan remains appropriate       AM-PAC PT "6 Clicks" Mobility   Outcome Measure  Help needed turning from your back to your side while in a flat bed without using bedrails?: None Help needed moving from lying on your back to sitting on the side of a flat bed without using bedrails?: A Lot Help needed moving to and from a bed to a chair (including a wheelchair)?: A Little Help needed standing up from a chair using your arms (e.g., wheelchair or bedside chair)?: A Little Help needed to walk in hospital room?: A Little Help needed climbing 3-5 steps with a railing? : A Lot 6 Click Score: 17    End of Session Equipment Utilized During Treatment: Gait belt Activity Tolerance: Patient tolerated treatment well;Patient limited by pain Patient left: in chair;with call bell/phone within reach;with chair alarm set Nurse Communication: Mobility status PT Visit Diagnosis: Muscle weakness (generalized) (M62.81);Other abnormalities of gait and mobility (R26.89);Pain Pain - Right/Left: Left Pain - part of body: Knee     Time: 1030-1314 PT Time Calculation (min) (ACUTE ONLY): 25 min  Charges:  $Therapeutic Exercise: 8-22 mins $Therapeutic Activity: 8-22 mins                     Julaine Fusi PTA 05/09/21, 8:03 AM

## 2021-05-09 NOTE — Plan of Care (Signed)

## 2021-05-09 NOTE — Progress Notes (Signed)
Inpatient Diabetes Program Recommendations  AACE/ADA: New Consensus Statement on Inpatient Glycemic Control   Target Ranges:  Prepandial:   less than 140 mg/dL      Peak postprandial:   less than 180 mg/dL (1-2 hours)      Critically ill patients:  140 - 180 mg/dL    Latest Reference Range & Units 05/08/21 07:57 05/08/21 12:30 05/08/21 16:27 05/09/21 08:14  Glucose-Capillary 70 - 99 mg/dL 224 (H) 228 (H) 241 (H) 378 (H)    Review of Glycemic Control  Diabetes history: DM2 Outpatient Diabetes medications: 70/30 20 units BID, Regular 0-10 units with lunch Current orders for Inpatient glycemic control: Novolog 0-15 units TID with meals, Novolog 0-5 units QHS  Inpatient Diabetes Program Recommendations:    Insulin: Please consider ordering 70/30 8 units BID.  Diet: Please consider changing diet from Regular to Carb Modified.  Thanks, Barnie Alderman, RN, MSN, CDE Diabetes Coordinator Inpatient Diabetes Program [phone] (Team Pager from 8am to 5pm)

## 2021-05-09 NOTE — Progress Notes (Signed)
OT Cancellation Note  Patient Details Name: Rhonda Tyler MRN: [id number] DOB: [birth year]   Cancelled Treatment:    Reason Eval/Treat Not Completed: Fatigue/lethargy limiting ability to participate. Pt having difficulty staying away while speaking with OT, unable to participate. Made plan to see pt in the morning.   Ardeth Perfect., MPH, MS, OTR/L ascom [phone] 05/09/21, 3:54 PM

## 2021-05-09 NOTE — Care Management Important Message (Signed)
Important Message  Patient Details  Name: Rhonda Tyler MRN: [id number] Date of Birth: [birth year]   Medicare Important Message Given:  N/A - LOS <3 / Initial given by admissions     [name] 05/09/2021, 9:21 AM

## 2021-05-10 LAB — GLUCOSE, CAPILLARY
Glucose-Capillary: 277 mg/dL — ABNORMAL HIGH (ref 70–99)
Glucose-Capillary: 277 mg/dL — ABNORMAL HIGH (ref 70–99)
Glucose-Capillary: 279 mg/dL — ABNORMAL HIGH (ref 70–99)
Glucose-Capillary: 294 mg/dL — ABNORMAL HIGH (ref 70–99)

## 2021-05-10 LAB — GRAM STAIN

## 2021-05-10 MED ORDER — INFLUENZA VAC A&B SA ADJ QUAD 0.5 ML IM PRSY: 0.5000 mL | PREFILLED_SYRINGE | INTRAMUSCULAR | Status: DC | PRN

## 2021-05-10 MED ORDER — ENOXAPARIN SODIUM 40 MG/0.4ML IJ SOSY: 40.0000 mg | PREFILLED_SYRINGE | INTRAMUSCULAR | 0 refills | Status: DC

## 2021-05-10 MED ORDER — OXYCODONE HCL 5 MG PO TABS: 5.0000 mg | ORAL_TABLET | ORAL | 0 refills | Status: DC | PRN

## 2021-05-10 NOTE — Progress Notes (Signed)
PT/OT/Dietary; per son and patient request, please allow patient to eat as soon as her meal tray arrives as patient has been reportedly not eating r/t cold food + therapy.

## 2021-05-10 NOTE — Progress Notes (Signed)
Subjective: 3 Days Post-Op Procedure(s) (LRB): TOTAL KNEE REVISION (Left) Patient reports pain as mild to moderate today.  She slept better and is more awake this morning. Patient is well, and has had no acute complaints or problems Current plan is for discharge to rehab when approved. Negative for chest pain and shortness of breath Fever: no Gastrointestinal:Negative for nausea and vomiting Patient had bowel movement yesterday.  Objective: Vital signs in last 24 hours: Temp:  [97.2 F (36.2 C)-98.7 F (37.1 C)] 98.3 F (36.8 C) (12/31 0436) Pulse Rate:  [69-88] 88 (12/31 0436) Resp:  [16-18] 16 (12/31 0436) BP: (124-145)/(40-65) 136/52 (12/31 0436) SpO2:  [84 %-100 %] 94 % (12/31 0436)  Intake/Output from previous day:  Intake/Output Summary (Last 24 hours) at 05/10/2021 0716 Last data filed at 05/09/2021 1847 Gross per 24 hour  Intake 240 ml  Output 725 ml  Net -485 ml    Intake/Output this shift: No intake/output data recorded.  Labs: No results for input(s): HGB in the last 72 hours. No results for input(s): WBC, RBC, HCT, PLT in the last 72 hours. No results for input(s): NA, K, CL, CO2, BUN, CREATININE, GLUCOSE, CALCIUM in the last 72 hours. No results for input(s): LABPT, INR in the last 72 hours.   EXAM General - Patient is Alert, Appropriate, and Oriented Extremity - ABD soft Neurovascular intact Dorsiflexion/Plantar flexion intact No cellulitis present Dressing/Incision -clean dry and intact.  No drainage. Motor Function - intact, moving foot and toes well on exam.  Abdomen soft with normal bowel sounds. Negative Homans to bilateral lower extremities.  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   Colon polyp    Coronary artery disease     Cutaneous sarcoidosis    Diabetes mellitus without complication (HCC)    Pt takes insulin   Diabetic neuropathy (HCC)    GERD (gastroesophageal reflux disease)    Headache    migaines in the past   Hemorrhoid    History of diabetic neuropathy    Hypercalcemia    Hyperlipemia    Hypertension    IBS (irritable bowel syndrome)    Iron deficiency anemia    Myocardial infarction (HCC)    Pneumonia    Sleep apnea     Assessment/Plan: 3 Days Post-Op Procedure(s) (LRB): TOTAL KNEE REVISION (Left) Principal Problem:   S/P revision of total knee  Estimated body mass index is 28.87 kg/m as calculated from the following:   Height as of this encounter: 5\' 3"  (1.6 m).   Weight as of this encounter: 73.9 kg. Advance diet Up with therapy D/C IV fluids when tolerating po intake.  Vitals stable this morning. Bulky dressing removed and fresh honeycomb applied. Most recent glucose 383, insulin adjusted. Up with therapy today. Plan for d/c to SNF when appropriate.  Possibly Monday.  DVT Prophylaxis - Lovenox and Foot Pumps Partial weightbearing to the left leg.  Reche Dixon [facility] 05/10/2021, 7:16 AM

## 2021-05-10 NOTE — Progress Notes (Signed)
Physical Therapy Treatment Patient Details Name: Rhonda Tyler MRN: [id number] DOB: 08/11/1940 Today's Date: 05/10/2021   History of Present Illness admitted for L TKR revision (05/07/21) secondary to peri-prosthetic distal femur fracture; PWB.    PT Comments    Patient received reclining in bed. She is agreeable to PT. Daughter Thayer Headings present throughout session. Patient reports 5/10 pain at L knee. Session focused on increasing ambulation distance, L quad activation, and L knee ROM. Patient continued to complain of dizziness but it was not as pervasive or limiting this session and patient was able to ambulate 2x10 feet with step to pattern, RW, and CGA. Patient needed mod A for bed mobility and min A for transfers. She continues to have significant quad weakness and is unable to perform ASLR or full L AROM knee extension in sitting. Patient would benefit from skilled physical therapy to address impairments and functional limitations (see PT Problem List below) to work towards stated goals and return to PLOF or maximal functional independence.    Recommendations for follow up therapy are one component of a multi-disciplinary discharge planning process, led by the attending physician.  Recommendations may be updated based on patient status, additional functional criteria and insurance authorization.  Follow Up Recommendations  Skilled nursing-short term rehab (<3 hours/day)     Assistance Recommended at Discharge Frequent or constant Supervision/Assistance  Equipment Recommendations  Rolling walker (2 wheels);BSC/3in1    Recommendations for Other Services       Precautions / Restrictions Precautions Precautions: Fall Restrictions Weight Bearing Restrictions: Yes LLE Weight Bearing: Partial weight bearing LLE Partial Weight Bearing Percentage or Pounds: 50% per Mia [place] PA     Mobility  Bed Mobility Overal bed mobility: Needs Assistance Bed Mobility: Supine to Sit     Supine to  sit: Mod assist;HOB elevated     General bed mobility comments: needed support at upper body and L LE to complete supine to sit    Transfers Overall transfer level: Needs assistance Equipment used: Rolling walker (2 wheels) Transfers: Sit to/from Stand Sit to Stand: Min assist Stand pivot transfers: Min assist;Mod assist         General transfer comment: Patient transfered sit<> stand with minA  bed to chair, and Chair to chair. improved ability to take steps with R LE    Ambulation/Gait Ambulation/Gait assistance: Min guard Gait Distance (Feet): 10 Feet Assistive device: Rolling walker (2 wheels) Gait Pattern/deviations: Step-to pattern;Antalgic;Trunk flexed Gait velocity: very slow but improved speed second bout.     General Gait Details: Patient ambulated 1x10 feet with RW and CGA with close chair follow for safety. Ambulated with step to gait pattern.   Stairs             Wheelchair Mobility    Modified Rankin (Stroke Patients Only)       Balance Overall balance assessment: Needs assistance Sitting-balance support: Feet supported;No upper extremity supported Sitting balance-Leahy Scale: Good Sitting balance - Comments: steady sitting edge of bed   Standing balance support: During functional activity;Reliant on assistive device for balance;Bilateral upper extremity supported Standing balance-Leahy Scale: Fair Standing balance comment: CGA for safety during standing activity. Dependent on B UE support on RW.                            Cognition Arousal/Alertness: Awake/alert Behavior During Therapy: WFL for tasks assessed/performed Overall Cognitive Status: Within Functional Limits for tasks assessed  General Comments: Pt is A and O x 4. Complains of intermittant dizziness that is better than last session        Exercises Total Joint Exercises Short Arc Quad: Seated;AAROM;10 reps;Left Knee  Flexion: Seated;AROM;Left Goniometric ROM: L knee PROM lacks 2 degrees extension: L knee PROM flexion to 90 degrees.    General Comments        Pertinent Vitals/Pain Pain Score: 5  Pain Location: L knee Pain Descriptors / Indicators: Grimacing;Guarding;Aching Pain Intervention(s): Limited activity within patient's tolerance;Monitored during session;Repositioned;Ice applied    Home Living                          Prior Function            PT Goals (current goals can now be found in the care plan section) Acute Rehab PT Goals Patient Stated Goal: return to complete independence PT Goal Formulation: With patient/family Time For Goal Achievement: 05/22/21 Potential to Achieve Goals: Good Progress towards PT goals: Progressing toward goals    Frequency    BID      PT Plan Current plan remains appropriate    Co-evaluation              AM-PAC PT "6 Clicks" Mobility   Outcome Measure  Help needed turning from your back to your side while in a flat bed without using bedrails?: A Lot Help needed moving from lying on your back to sitting on the side of a flat bed without using bedrails?: A Lot Help needed moving to and from a bed to a chair (including a wheelchair)?: A Little Help needed standing up from a chair using your arms (e.g., wheelchair or bedside chair)?: A Little Help needed to walk in hospital room?: A Little Help needed climbing 3-5 steps with a railing? : Total 6 Click Score: 14    End of Session Equipment Utilized During Treatment: Gait belt Activity Tolerance: Patient tolerated treatment well;Patient limited by pain (limited by dizziness) Patient left: in chair;with call bell/phone within reach;with chair alarm set;with family/visitor present;with nursing/sitter in room Nurse Communication: Mobility status PT Visit Diagnosis: Muscle weakness (generalized) (M62.81);Other abnormalities of gait and mobility (R26.89);Pain Pain - Right/Left:  Left Pain - part of body: Knee     Time: 1455-1530 PT Time Calculation (min) (ACUTE ONLY): 35 min  Charges:  $Gait Training: 8-22 mins $Therapeutic Activity: 8-22 mins                     Everlean Alstrom. Graylon Good, PT, DPT 05/10/21, 3:49 PM

## 2021-05-10 NOTE — Progress Notes (Signed)
Physical Therapy Treatment Patient Details Name: Rhonda Tyler MRN: [id number] DOB: November 06, 1940 Today's Date: 05/10/2021   History of Present Illness admitted for L TKR revision (05/07/21) secondary to peri-prosthetic distal femur fracture; PWB.    PT Comments    Patient agreeable to PT today. No pain at rest but reported pain at L knee with movement with 4.5/10 reported by end of session. Patient needed min-mod A assistance for bed mobility and transfers. She was able to take a few steps from [facility] to chair. She demo limited R step due to difficulty with weight bearing on L LE and arm weakness that limited how much she could use her arms on the RW to hold herself up. A lower walker improved her ability to use arms. Patient also limited by poor quad activity. She was unable to perform ASLR on L LE, had difficulty with quad set, and had only about 20 degrees of motion with L LAQ. She had intermittent dizziness exacerbated by going supine to sit and sit to stand that limited further mobility attempts. Patient needed assistance with pericare after BM/urination at [facility]. Patient continues to requires significant physical assist for basic mobility and PT continues to recommend short term rehab prior to return home due to rehab needs. Patient would benefit from skilled physical therapy to address impairments and functional limitations to work towards stated goals and return to PLOF or maximal functional independence.    Recommendations for follow up therapy are one component of a multi-disciplinary discharge planning process, led by the attending physician.  Recommendations may be updated based on patient status, additional functional criteria and insurance authorization.  Follow Up Recommendations  Skilled nursing-short term rehab (<3 hours/day)     Assistance Recommended at Discharge Frequent or constant Supervision/Assistance  Equipment Recommendations  Rolling walker (2 wheels);BSC/3in1     Recommendations for Other Services       Precautions / Restrictions Precautions Precautions: Fall Restrictions Weight Bearing Restrictions: Yes LLE Weight Bearing: Partial weight bearing LLE Partial Weight Bearing Percentage or Pounds: 50% per Mia Creek PA     Mobility  Bed Mobility Overal bed mobility: Needs Assistance Bed Mobility: Supine to Sit     Supine to sit: Min assist;Mod assist     General bed mobility comments: needed support at upper body and L LE to complete supine to sit    Transfers Overall transfer level: Needs assistance Equipment used: Rolling walker (2 wheels) Transfers: Sit to/from Stand Sit to Stand: Min assist Stand pivot transfers: Min assist;Mod assist         General transfer comment: Patient transfered sit<> stand with min--modA  bed/bed, bed to [facility], BSC to chair, and Chair to chair. unable to take steps with R LE at first due to lack of WB on L LE and weakness through B UE. Adjusted RW lower and pateint was able to take very small steps with R LE when transferring from [facility] to chair. Limited by weakness and dizziness. needed mod A from lower surfaces and min A from higher surfaces with arm rests.    Ambulation/Gait Ambulation/Gait assistance: Min guard Gait Distance (Feet): 4 Feet Assistive device: Rolling walker (2 wheels) Gait Pattern/deviations: Step-to pattern;Antalgic;Trunk flexed Gait velocity: decreased     General Gait Details: minimal advancement of R LE due to difficulty weight bearing on L LE and weakness in arms. Improved with lower walker. Attempted further ambulation but unable due to dizziness.   Stairs  Wheelchair Mobility    Modified Rankin (Stroke Patients Only)       Balance Overall balance assessment: Needs assistance Sitting-balance support: Feet supported;No upper extremity supported Sitting balance-Leahy Scale: Good     Standing balance support: During functional activity;Reliant on  assistive device for balance;Bilateral upper extremity supported Standing balance-Leahy Scale: Fair Standing balance comment: CGA for safety during standing activity. Dependent on B UE support on RW.                            Cognition Arousal/Alertness: Awake/alert Behavior During Therapy: WFL for tasks assessed/performed Overall Cognitive Status: Within Functional Limits for tasks assessed                                 General Comments: Pt is A and O x 4. Complains of intermittant dizziness        Exercises Total Joint Exercises Quad Sets: AROM;10 reps;Supine (L LE) Short Arc Quad: AROM;20 reps;Seated (L LE) Straight Leg Raises: Supine (L LE attempted but unable) Goniometric ROM: L knee PROM lacks 2 degrees extension, L knee PROM to 90 degrees flexion. only able to perform about 20 degrees AROM L knee extension from seated position during LAQ. Other Exercises Other Exercises: assisted patient with pericare after BM/urination at [place] comments (skin integrity, edema, etc.): activity limited due to intermittant dizziness.      Pertinent Vitals/Pain Pain Assessment: Faces Pain Score: 5  Faces Pain Scale: Hurts whole lot Pain Location: L knee with movement. Reports 4.5/10 by end of session. Pain Descriptors / Indicators: Grimacing;Guarding;Crying Pain Intervention(s): Limited activity within patient's tolerance;Monitored during session;Repositioned;Ice applied    Home Living                          Prior Function            PT Goals (current goals can now be found in the care plan section) Acute Rehab PT Goals Patient Stated Goal: return to complete independence PT Goal Formulation: With patient/family Time For Goal Achievement: 05/22/21 Potential to Achieve Goals: Good Progress towards PT goals: Progressing toward goals    Frequency    BID      PT Plan Current plan remains appropriate     Co-evaluation              AM-PAC PT "6 Clicks" Mobility   Outcome Measure  Help needed turning from your back to your side while in a flat bed without using bedrails?: A Lot Help needed moving from lying on your back to sitting on the side of a flat bed without using bedrails?: A Lot Help needed moving to and from a bed to a chair (including a wheelchair)?: A Little Help needed standing up from a chair using your arms (e.g., wheelchair or bedside chair)?: A Little Help needed to walk in hospital room?: A Little Help needed climbing 3-5 steps with a railing? : Total 6 Click Score: 14    End of Session Equipment Utilized During Treatment: Gait belt Activity Tolerance: Patient tolerated treatment well;Patient limited by pain (limited by dizziness) Patient left: in chair;with call bell/phone within reach;with chair alarm set;with nursing/sitter in room Nurse Communication: Mobility status PT Visit Diagnosis: Muscle weakness (generalized) (M62.81);Other abnormalities of gait and mobility (R26.89);Pain Pain - Right/Left: Left Pain - part of  body: Knee     Time: 0822-0910 PT Time Calculation (min) (ACUTE ONLY): 48 min  Charges:  $Therapeutic Exercise: 8-22 mins $Therapeutic Activity: 23-37 mins                     Everlean Alstrom. Graylon Good, PT, DPT 05/10/21, 9:28 AM

## 2021-05-10 NOTE — TOC Progression Note (Addendum)
Transition of Care [facility]) - Progression Note    Patient Details  Name: Rhonda Tyler MRN: [id number] Date of Birth: [birth year]  Transition of Care [facility]) CM/SW [place], LCSW Phone Number: 05/10/2021, 9:23 AM  Clinical Narrative:   Reached out to Neoma Laming at [facility] to see if they have reviewed referral. Waiting on response. As of yesterday, [facility] was not accepting admissions this weekend.  12:27- Spoke to Neoma Laming with [facility] who stated she should be able to make a bed offer and accept patient on Monday, she would need to double check patient's meds on Monday. She will follow up with weekday CSW on Monday.  Expected Discharge Plan: [place] Barriers to Discharge: Continued Medical Work up  Expected Discharge Plan and Services Expected Discharge Plan: [place] Choice: [place] arrangements for the past 2 months: Single Family Home                                       Social Determinants of Health (SDOH) Interventions    Readmission Risk Interventions No flowsheet data found.

## 2021-05-11 ENCOUNTER — Inpatient Hospital Stay: Payer: Medicare Other

## 2021-05-11 LAB — BASIC METABOLIC PANEL
Anion gap: 6 (ref 5–15)
BUN: 30 mg/dL — ABNORMAL HIGH (ref 8–23)
CO2: 25 mmol/L (ref 22–32)
Calcium: 9 mg/dL (ref 8.9–10.3)
Chloride: 94 mmol/L — ABNORMAL LOW (ref 98–111)
Creatinine, Ser: 0.87 mg/dL (ref 0.44–1.00)
GFR, Estimated: >60 mL/min (ref >60)
Glucose, Bld: 218 mg/dL — ABNORMAL HIGH (ref 70–99)
Potassium: 5.1 mmol/L (ref 3.5–5.1)
Sodium: 125 mmol/L — ABNORMAL LOW (ref 135–145)

## 2021-05-11 LAB — CBC
HCT: 24.2 % — ABNORMAL LOW (ref 36.0–46.0)
Hemoglobin: 7.7 g/dL — ABNORMAL LOW (ref 12.0–15.0)
MCH: 29.6 pg (ref 26.0–34.0)
MCHC: 31.8 g/dL (ref 30.0–36.0)
MCV: 93.1 fL (ref 80.0–100.0)
Platelets: 201 10*3/uL (ref 150–400)
RBC: 2.6 MIL/uL — ABNORMAL LOW (ref 3.87–5.11)
RDW: 15.8 % — ABNORMAL HIGH (ref 11.5–15.5)
WBC: 6.8 10*3/uL (ref 4.0–10.5)
nRBC: 0 % (ref 0.0–0.2)

## 2021-05-11 LAB — GLUCOSE, CAPILLARY
Glucose-Capillary: 219 mg/dL — ABNORMAL HIGH (ref 70–99)
Glucose-Capillary: 252 mg/dL — ABNORMAL HIGH (ref 70–99)
Glucose-Capillary: 345 mg/dL — ABNORMAL HIGH (ref 70–99)
Glucose-Capillary: 197 mg/dL — ABNORMAL HIGH (ref 70–99)

## 2021-05-11 MED ORDER — FE FUMARATE-B12-VIT C-FA-IFC PO CAPS
1.0000 | ORAL_CAPSULE | Freq: Two times a day (BID) | ORAL | Status: DC
  Administered 2021-05-11 – 2021-05-13 (×4): 1 via ORAL
  Filled 2021-05-11 (×6): qty 1

## 2021-05-11 NOTE — Progress Notes (Addendum)
Subjective: 4 Days Post-Op Procedure(s) (LRB): TOTAL KNEE REVISION (Left) Patient reports pain as mild to moderate today.  She is complaining of some congestion and coughing through the night.  No fever or night sweats.  She did have dizziness with ambulation yesterday. Patient is well, and has had no acute complaints or problems Current plan is for discharge to rehab when approved. Negative for chest pain and shortness of breath, but cough and congestion Fever: no Gastrointestinal:Negative for nausea and vomiting Patient had bowel movement yesterday.  Objective: Vital signs in last 24 hours: Temp:  [97.8 F (36.6 C)-98.7 F (37.1 C)] 98.4 F (36.9 C) (01/01 0315) Pulse Rate:  [53-95] 95 (01/01 0315) Resp:  [15-18] 15 (01/01 0315) BP: (121-146)/(47-57) 142/55 (01/01 0315) SpO2:  [96 %-100 %] 96 % (12/31 1533)  Intake/Output from previous day:  Intake/Output Summary (Last 24 hours) at 05/11/2021 0705 Last data filed at 05/10/2021 1416 Gross per 24 hour  Intake 240 ml  Output --  Net 240 ml    Intake/Output this shift: No intake/output data recorded.  Labs: Recent Labs    05/11/21 0430  HGB 7.7*   Recent Labs    05/11/21 0430  WBC 6.8  RBC 2.60*  HCT 24.2*  PLT 201   Recent Labs    05/11/21 0430  NA 125*  K 5.1  CL 94*  CO2 25  BUN 30*  CREATININE 0.87  GLUCOSE 218*  CALCIUM 9.0   No results for input(s): LABPT, INR in the last 72 hours.   EXAM General - Patient is Alert, Appropriate, and Oriented Extremity - ABD soft Neurovascular intact Dorsiflexion/Plantar flexion intact No cellulitis present Dressing/Incision -clean dry and intact.  No drainage. Motor Function - intact, moving foot and toes well on exam.  Abdomen soft with normal bowel sounds. Negative Homans to bilateral lower extremities.  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   Colon polyp    Coronary artery disease    Cutaneous sarcoidosis    Diabetes mellitus without complication (HCC)    Pt takes insulin   Diabetic neuropathy (HCC)    GERD (gastroesophageal reflux disease)    Headache    migaines in the past   Hemorrhoid    History of diabetic neuropathy    Hypercalcemia    Hyperlipemia    Hypertension    IBS (irritable bowel syndrome)    Iron deficiency anemia    Myocardial infarction (HCC)    Pneumonia    Sleep apnea     Assessment/Plan: 4 Days Post-Op Procedure(s) (LRB): TOTAL KNEE REVISION (Left) Principal Problem:   S/P revision of total knee  Estimated body mass index is 28.87 kg/m as calculated from the following:   Height as of this encounter: 5\' 3"  (1.6 m).   Weight as of this encounter: 73.9 kg. Advance diet Up with therapy D/C IV fluids when tolerating po intake.  Vitals stable this morning. Bulky dressing removed and fresh honeycomb applied. Postop anemia with hemoglobin 7.7.  Dizziness yesterday.  Recheck hemoglobin tomorrow.  Watch for trending hemoglobin.  No transfusion. Most recent glucose 218, insulin adjusted. Chest x-ray ordered.  Congestion and cough. Up with therapy today. Plan for d/c to SNF when  appropriate.  Possibly Monday.  DVT Prophylaxis - Lovenox and Foot Pumps Partial weightbearing to the left leg.  Reche Dixon [facility] 05/11/2021, 7:05 AM

## 2021-05-11 NOTE — Progress Notes (Signed)
Physical Therapy Treatment Patient Details Name: Rhonda Tyler MRN: [id number] DOB: [birth year] Today's Date: 05/11/2021   History of Present Illness admitted for L TKR revision (05/07/21) secondary to peri-prosthetic distal femur fracture; PWB.    PT Comments    Pt received supine in bed, agreeable to therapy. She progressed ambulation distance to 65ft; she reported fatigue and was SOB upon completion. She performed bed mobility with MIN A, STS with MOD A requiring some lifting assistance. Seated therex performed EOB prior to standing; unable to obtain full extension in bilateral knees during LAQ. Pt continuing to demonstrate deficits in strength, ROM, functional mobility and standing/ambulatory endurance. Pt will benefit from STR to address above deficits and assist with returning to independent.    Recommendations for follow up therapy are one component of a multi-disciplinary discharge planning process, led by the attending physician.  Recommendations may be updated based on patient status, additional functional criteria and insurance authorization.  Follow Up Recommendations  Skilled nursing-short term rehab (<3 hours/day)     Assistance Recommended at Discharge Frequent or constant Supervision/Assistance  Equipment Recommendations  Other (comment) (TBD at next venue of care)    Recommendations for Other Services       Precautions / Restrictions Precautions Precautions: Fall Restrictions Weight Bearing Restrictions: Yes LLE Weight Bearing: Partial weight bearing LLE Partial Weight Bearing Percentage or Pounds: 50% per [place] PA     Mobility  Bed Mobility Overal bed mobility: Needs Assistance Bed Mobility: Supine to Sit     Supine to sit: HOB elevated;Min assist     General bed mobility comments: MIN A for HHA to pull trunk to upright    Transfers Overall transfer level: Needs assistance Equipment used: Rolling walker (2 wheels) Transfers: Sit to/from Stand Sit to  Stand: Mod assist           General transfer comment: STS with MOD A to lift from EOB.    Ambulation/Gait Ambulation/Gait assistance: Min guard Gait Distance (Feet): 15 Feet Assistive device: Rolling walker (2 wheels) Gait Pattern/deviations: Step-to pattern;Antalgic;Trunk flexed;Decreased stance time - left Gait velocity: decreased     General Gait Details: Step-to gait pattern within room. 32ft using RW. SOB upon completion.   Stairs             Wheelchair Mobility    Modified Rankin (Stroke Patients Only)       Balance Overall balance assessment: Needs assistance Sitting-balance support: Feet supported;No upper extremity supported Sitting balance-Leahy Scale: Fair Sitting balance - Comments: mild lean to the right sitting EOB; pt reporting she doesn't feel steady for the first couple minutes of sitting   Standing balance support: During functional activity;Reliant on assistive device for balance;Bilateral upper extremity supported Standing balance-Leahy Scale: Poor Standing balance comment: CGA and dependent on RW during static standing and ambulation                            Cognition Arousal/Alertness: Awake/alert Behavior During Therapy: WFL for tasks assessed/performed Overall Cognitive Status: Within Functional Limits for tasks assessed                                          Exercises Total Joint Exercises Ankle Circles/Pumps: AROM;10 reps;Both Long Arc Quad: AROM;Both;10 reps;Seated (incomplete ROM in BLE) Marching in Standing: AROM;Both;10 reps;Seated    General Comments  Pertinent Vitals/Pain Pain Assessment: Faces Pain Score: 5  Pain Location: L knee Pain Descriptors / Indicators: Grimacing;Guarding;Aching Pain Intervention(s): Limited activity within patient's tolerance;Monitored during session;Repositioned;Ice applied    Home Living                          Prior Function             PT Goals (current goals can now be found in the care plan section) Acute Rehab PT Goals Patient Stated Goal: return to complete independence PT Goal Formulation: With patient/family Time For Goal Achievement: 05/22/21 Potential to Achieve Goals: Good    Frequency    BID      PT Plan      Co-evaluation              AM-PAC PT "6 Clicks" Mobility   Outcome Measure  Help needed turning from your back to your side while in a flat bed without using bedrails?: A Little Help needed moving from lying on your back to sitting on the side of a flat bed without using bedrails?: A Little Help needed moving to and from a bed to a chair (including a wheelchair)?: A Little Help needed standing up from a chair using your arms (e.g., wheelchair or bedside chair)?: A Little Help needed to walk in hospital room?: A Little Help needed climbing 3-5 steps with a railing? : Total 6 Click Score: 16    End of Session Equipment Utilized During Treatment: Gait belt Activity Tolerance: Patient tolerated treatment well;Patient limited by fatigue (limited by dizziness) Patient left: in chair;with call bell/phone within reach;with chair alarm set Nurse Communication: Mobility status PT Visit Diagnosis: Muscle weakness (generalized) (M62.81);Other abnormalities of gait and mobility (R26.89);Pain Pain - Right/Left: Left Pain - part of body: Knee     Time: 1204-1227 PT Time Calculation (min) (ACUTE ONLY): 23 min  Charges:  $Therapeutic Exercise: 23-37 mins                     Patrina Levering PT, DPT 05/11/21 12:58 PM [phone]

## 2021-05-12 LAB — AEROBIC/ANAEROBIC CULTURE W GRAM STAIN (SURGICAL/DEEP WOUND)
Culture: NO GROWTH
Culture: NO GROWTH
Gram Stain: NONE SEEN
Gram Stain: NONE SEEN

## 2021-05-12 LAB — GLUCOSE, CAPILLARY
Glucose-Capillary: 233 mg/dL — ABNORMAL HIGH (ref 70–99)
Glucose-Capillary: 204 mg/dL — ABNORMAL HIGH (ref 70–99)
Glucose-Capillary: 252 mg/dL — ABNORMAL HIGH (ref 70–99)
Glucose-Capillary: 185 mg/dL — ABNORMAL HIGH (ref 70–99)

## 2021-05-12 LAB — RESP PANEL BY RT-PCR (FLU A&B, COVID) ARPGX2
Influenza A by PCR: NEGATIVE
Influenza B by PCR: NEGATIVE
SARS Coronavirus 2 by RT PCR: NEGATIVE

## 2021-05-12 LAB — HEMOGLOBIN AND HEMATOCRIT, BLOOD
HCT: 23 % — ABNORMAL LOW (ref 36.0–46.0)
Hemoglobin: 7.5 g/dL — ABNORMAL LOW (ref 12.0–15.0)

## 2021-05-12 LAB — CBC
HCT: 28.3 % — ABNORMAL LOW (ref 36.0–46.0)
Hemoglobin: 9.2 g/dL — ABNORMAL LOW (ref 12.0–15.0)
MCH: 30.4 pg (ref 26.0–34.0)
MCHC: 32.5 g/dL (ref 30.0–36.0)
MCV: 93.4 fL (ref 80.0–100.0)
Platelets: 242 10*3/uL (ref 150–400)
RBC: 3.03 MIL/uL — ABNORMAL LOW (ref 3.87–5.11)
RDW: 15.9 % — ABNORMAL HIGH (ref 11.5–15.5)
WBC: 8 10*3/uL (ref 4.0–10.5)
nRBC: 0 % (ref 0.0–0.2)

## 2021-05-12 MED ORDER — SODIUM CHLORIDE 0.9 % IV BOLUS
500.0000 mL | Freq: Once | INTRAVENOUS | Status: AC
Start: 2021-05-12 — End: 2021-05-12
  Administered 2021-05-12: 500 mL via INTRAVENOUS

## 2021-05-12 NOTE — Care Management Important Message (Signed)
Important Message  Patient Details  Name: Rhonda Tyler MRN: [id number] Date of Birth: 1940-11-04   Medicare Important Message Given:  Yes     Juliann Pulse A Shanetta Nicolls 05/12/2021, 11:45 AM

## 2021-05-12 NOTE — Plan of Care (Signed)

## 2021-05-12 NOTE — Progress Notes (Signed)
OT Cancellation Note  Patient Details Name: GLYNNA FAILLA MRN: [id number] DOB: [birth year]   Cancelled Treatment:    Reason Eval/Treat Not Completed: Other (comment). Pt working with PTA upon attempt. Will re-attempt OT tx at later date/time as pt is available.   Ardeth Perfect., MPH, MS, OTR/L ascom [phone] 05/12/21, 2:02 PM

## 2021-05-12 NOTE — Progress Notes (Signed)
Physical Therapy Treatment Patient Details Name: Rhonda Tyler MRN: [id number] DOB: [birth year] Today's Date: 05/12/2021   History of Present Illness admitted for L TKR revision (05/07/21) secondary to peri-prosthetic distal femur fracture; PWB.    PT Comments    Pt sat up in chair for ~4 hours today without c/o increased discomfort. MinA for sit<>stand transfers from chair/commode. Increased gait distance to 35ft with RW, CGA, PWB L LE. Pt assisted to bed with [place] for LE management. L LE ROM/strengthening exercises performed and positioning to facilitate knee extension. Pt tolerated session well. Continue per POC. Possible transition to SNF tomorrow.     Recommendations for follow up therapy are one component of a multi-disciplinary discharge planning process, led by the attending physician.  Recommendations may be updated based on patient status, additional functional criteria and insurance authorization.  Follow Up Recommendations  Skilled nursing-short term rehab (<3 hours/day)     Assistance Recommended at Discharge Frequent or constant Supervision/Assistance  Equipment Recommendations  Other (comment) (TBD at next venue)    Recommendations for Other Services       Precautions / Restrictions Precautions Precautions: Fall Restrictions Weight Bearing Restrictions: Yes LLE Weight Bearing: Partial weight bearing     Mobility  Bed Mobility Overal bed mobility: Needs Assistance Bed Mobility: Sit to Supine     Supine to sit: Mod assist (For management of LE's)     General bed mobility comments:  (Pt up in bedside cahir upon arrival)    Transfers Overall transfer level: Needs assistance Equipment used: Rolling walker (2 wheels) Transfers: Sit to/from Stand;Bed to chair/wheelchair/BSC Sit to Stand: Min assist           General transfer comment:  (Improved transfer technique from this am)    Ambulation/Gait Ambulation/Gait assistance: Min guard Gait Distance  (Feet): 20 Feet Assistive device: Rolling walker (2 wheels) Gait Pattern/deviations: Step-to pattern;Antalgic;Trunk flexed;Decreased stance time - left Gait velocity: decreased     General Gait Details: Step-to gait pattern within room. 76ft using RW PWB L LE   Stairs             Wheelchair Mobility    Modified Rankin (Stroke Patients Only)       Balance Overall balance assessment: Needs assistance Sitting-balance support: Feet supported;No upper extremity supported Sitting balance-Leahy Scale: Good     Standing balance support: During functional activity;Reliant on assistive device for balance;Bilateral upper extremity supported Standing balance-Leahy Scale: Poor Standing balance comment: CGA and dependent on RW during static standing and ambulation                            Cognition Arousal/Alertness: Awake/alert Behavior During Therapy: WFL for tasks assessed/performed Overall Cognitive Status: Within Functional Limits for tasks assessed                                 General Comments:  (No c/o dizziness during session)        Exercises Total Joint Exercises Ankle Circles/Pumps: AROM;10 reps;Both Quad Sets: AROM;10 reps;Supine Heel Slides: AROM;10 reps Hip ABduction/ADduction: AROM;10 reps Long Arc Quad: AROM;Both;10 reps;Seated Knee Flexion: Seated;Left;AAROM Goniometric ROM: 2-90    General Comments General comments (skin integrity, edema, etc.):  (Pt educated on weight bearing restrictions with good understanding)      Pertinent Vitals/Pain Pain Assessment: 0-10 Pain Score: 2  Pain Location: L knee Pain Descriptors / Indicators: Grimacing;Guarding;Aching  Pain Intervention(s): Monitored during session    Home Living                          Prior Function            PT Goals (current goals can now be found in the care plan section) Acute Rehab PT Goals Patient Stated Goal: return to complete  independence Progress towards PT goals: Progressing toward goals    Frequency    BID      PT Plan Current plan remains appropriate    Co-evaluation              AM-PAC PT "6 Clicks" Mobility   Outcome Measure  Help needed turning from your back to your side while in a flat bed without using bedrails?: A Little Help needed moving from lying on your back to sitting on the side of a flat bed without using bedrails?: A Little Help needed moving to and from a bed to a chair (including a wheelchair)?: A Little Help needed standing up from a chair using your arms (e.g., wheelchair or bedside chair)?: A Little Help needed to walk in hospital room?: A Little Help needed climbing 3-5 steps with a railing? : Total 6 Click Score: 16    End of Session Equipment Utilized During Treatment: Gait belt Activity Tolerance: Patient tolerated treatment well;Patient limited by fatigue Patient left: in bed;with call bell/phone within reach;with bed alarm set;with nursing/sitter in room Nurse Communication: Mobility status PT Visit Diagnosis: Muscle weakness (generalized) (M62.81);Other abnormalities of gait and mobility (R26.89);Pain Pain - Right/Left: Left Pain - part of body: Knee     Time: 1410-1434 PT Time Calculation (min) (ACUTE ONLY): 24 min  Charges:  $Gait Training: 8-22 mins $Therapeutic Exercise: 8-22 mins                    Mikel Cella, PTA    [name] 05/12/2021, 2:59 PM

## 2021-05-12 NOTE — Progress Notes (Signed)
Physical Therapy Treatment Patient Details Name: Rhonda Tyler MRN: [id number] DOB: 03-Sep-1940 Today's Date: 05/12/2021   History of Present Illness admitted for L TKR revision (05/07/21) secondary to peri-prosthetic distal femur fracture; PWB.    PT Comments    Pt received up in bedside chair. Pre-medicated prior to session, 2/10 L Knee pain. Pt required MinA for sit<>stand transfers from chair and bed. Short distance gait with RW, PWB L LE with CGA, 6ft x 2. Seated rest break required due to fatigue prior to completing gait training. O2 sats 99% on RA, HR 80-100bpm and irregular. No c/o dizziness with standing. Pt is progressing despite being easily fatigued. Continue to recommend SNF once medically cleared for d/c.     Recommendations for follow up therapy are one component of a multi-disciplinary discharge planning process, led by the attending physician.  Recommendations may be updated based on patient status, additional functional criteria and insurance authorization.  Follow Up Recommendations  Skilled nursing-short term rehab (<3 hours/day)     Assistance Recommended at Discharge Frequent or constant Supervision/Assistance  Equipment Recommendations  Other (comment) (TBD at next venue)    Recommendations for Other Services       Precautions / Restrictions Precautions Precautions: Fall Restrictions Weight Bearing Restrictions: Yes LLE Weight Bearing: Partial weight bearing     Mobility  Bed Mobility               General bed mobility comments:  (Pt up in bedside cahir upon arrival)    Transfers Overall transfer level: Needs assistance Equipment used: Rolling walker (2 wheels) Transfers: Sit to/from Stand Sit to Stand: Min assist           General transfer comment:  (B UE's required to transition to standing)    Ambulation/Gait Ambulation/Gait assistance: Min guard Gait Distance (Feet): 15 Feet Assistive device: Rolling walker (2 wheels) Gait  Pattern/deviations: Step-to pattern;Antalgic;Trunk flexed;Decreased stance time - left Gait velocity: decreased     General Gait Details:  (PWB L LE, 56ft x 2)   Stairs             Wheelchair Mobility    Modified Rankin (Stroke Patients Only)       Balance Overall balance assessment: Needs assistance Sitting-balance support: Feet supported;No upper extremity supported Sitting balance-Leahy Scale: Good     Standing balance support: During functional activity;Reliant on assistive device for balance;Bilateral upper extremity supported Standing balance-Leahy Scale: Poor Standing balance comment: CGA and dependent on RW during static standing and ambulation                            Cognition Arousal/Alertness: Awake/alert Behavior During Therapy: WFL for tasks assessed/performed Overall Cognitive Status: Within Functional Limits for tasks assessed                                 General Comments: Pt is A and O x 4. Complains of intermittant dizziness that is better than last session        Exercises Total Joint Exercises Ankle Circles/Pumps: AROM;10 reps;Both Long Arc Quad: AROM;Both;10 reps;Seated Knee Flexion: Seated;Left;AAROM Goniometric ROM: 2-90    General Comments General comments (skin integrity, edema, etc.):  (Pt educated on weight bearing restrictions with good understanding)      Pertinent Vitals/Pain Pain Assessment: 0-10 Pain Score: 3  Pain Location: L knee Pain Descriptors / Indicators: Grimacing;Guarding;Aching Pain Intervention(s):  Monitored during session;Premedicated before session    Home Living                          Prior Function            PT Goals (current goals can now be found in the care plan section) Acute Rehab PT Goals Patient Stated Goal: return to complete independence Progress towards PT goals: Progressing toward goals    Frequency    BID      PT Plan Current plan  remains appropriate    Co-evaluation              AM-PAC PT "6 Clicks" Mobility   Outcome Measure  Help needed turning from your back to your side while in a flat bed without using bedrails?: A Little Help needed moving from lying on your back to sitting on the side of a flat bed without using bedrails?: A Little Help needed moving to and from a bed to a chair (including a wheelchair)?: A Little Help needed standing up from a chair using your arms (e.g., wheelchair or bedside chair)?: A Little Help needed to walk in hospital room?: A Little Help needed climbing 3-5 steps with a railing? : Total 6 Click Score: 16    End of Session Equipment Utilized During Treatment: Gait belt Activity Tolerance: Patient tolerated treatment well;Patient limited by fatigue Patient left: in chair;with call bell/phone within reach;with chair alarm set Nurse Communication: Mobility status PT Visit Diagnosis: Muscle weakness (generalized) (M62.81);Other abnormalities of gait and mobility (R26.89);Pain Pain - Right/Left: Left Pain - part of body: Knee     Time: 2778-2423 PT Time Calculation (min) (ACUTE ONLY): 24 min  Charges:  $Gait Training: 8-22 mins $Therapeutic Exercise: 8-22 mins                    Rhonda Tyler, PTA   Rhonda Tyler 05/12/2021, 11:24 AM

## 2021-05-12 NOTE — TOC Progression Note (Signed)
Transition of Care [facility]) - Progression Note    Patient Details  Name: ELCIE PELSTER MRN: [id number] Date of Birth: 1940-10-21  Transition of Care [facility]) CM/SW [place], RN Phone Number: 05/12/2021, 10:19 AM  Clinical Narrative:    Hilda Blades with [facility] Confirms that the patient will be able to come there tomorrow at DC   Expected Discharge Plan: [place] Barriers to Discharge: Continued Medical Work up  Expected Discharge Plan and Services Expected Discharge Plan: [place] Choice: [place] arrangements for the past 2 months: Single Family Home                                       Social Determinants of Health (SDOH) Interventions    Readmission Risk Interventions No flowsheet data found.

## 2021-05-12 NOTE — TOC Progression Note (Signed)
Transition of Care [facility]) - Progression Note    Patient Details  Name: Rhonda Tyler MRN: [id number] Date of Birth: 03-04-1941  Transition of Care [facility]) CM/SW [place], RN Phone Number: 05/12/2021, 8:54 AM  Clinical Narrative:   Reached out to Hilda Blades at [facility] to inquire if they will be able to accept the patient, awaiting a response    Expected Discharge Plan: [place] Barriers to Discharge: Continued Medical Work up  Expected Discharge Plan and Services Expected Discharge Plan: [place] Choice: [place] arrangements for the past 2 months: Single Family Home                                       Social Determinants of Health (SDOH) Interventions    Readmission Risk Interventions No flowsheet data found.

## 2021-05-12 NOTE — Progress Notes (Signed)
Subjective: 5 Days Post-Op Procedure(s) (LRB): TOTAL KNEE REVISION (Left) Patient reports left knee pain as mild.   Patient is overall doing well.  Still having some congestion and mild cough without fevers.  No complaints of chest pain shortness of breath.  She has been afebrile.  Denies any dizziness or lightheadedness.  Tolerating p.o. well.  Chest x-ray negative yesterday. We will continue therapy today.  Plan is to go to rehab facility pending availability  Objective: Vital signs in last 24 hours: Temp:  [97.6 F (36.4 C)-99.2 F (37.3 C)] 97.8 F (36.6 C) (01/02 0741) Pulse Rate:  [90-107] 107 (01/02 0741) Resp:  [16-18] 16 (01/02 0741) BP: (135-142)/(49-73) 135/56 (01/02 0741) SpO2:  [98 %-100 %] 98 % (01/02 0741)  Intake/Output from previous day: 01/01 0701 - 01/02 0700 In: -  Out: 625 [Urine:625] Intake/Output this shift: No intake/output data recorded.  Recent Labs    05/11/21 0430 05/12/21 0542  HGB 7.7* 7.5*   Recent Labs    05/11/21 0430 05/12/21 0542  WBC 6.8  --   RBC 2.60*  --   HCT 24.2* 23.0*  PLT 201  --    Recent Labs    05/11/21 0430  NA 125*  K 5.1  CL 94*  CO2 25  BUN 30*  CREATININE 0.87  GLUCOSE 218*  CALCIUM 9.0   No results for input(s): LABPT, INR in the last 72 hours.  EXAM General - Patient is Alert, Appropriate, and Oriented Pulmonary -clear to auscultation bilaterally with no wheezing rales or rhonchi.  Good air movement bilaterally. Extremity - Neurovascular intact Sensation intact distally Intact pulses distally Dorsiflexion/Plantar flexion intact No cellulitis present Compartment soft Dressing - dressing C/D/I and no drainage Motor Function - intact, moving foot and toes well on exam.   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   Colon polyp    Coronary artery disease    Cutaneous sarcoidosis    Diabetes mellitus without complication (HCC)    Pt takes insulin   Diabetic neuropathy (HCC)    GERD (gastroesophageal reflux disease)    Headache    migaines in the past   Hemorrhoid    History of diabetic neuropathy    Hypercalcemia    Hyperlipemia    Hypertension    IBS (irritable bowel syndrome)    Iron deficiency anemia    Myocardial infarction (HCC)    Pneumonia    Sleep apnea     Assessment/Plan:   5 Days Post-Op Procedure(s) (LRB): TOTAL KNEE REVISION (Left) Principal Problem:   S/P revision of total knee  Estimated body mass index is 28.87 kg/m as calculated from the following:   Height as of this encounter: 5\' 3"  (1.6 m).   Weight as of this encounter: 73.9 kg.  Cough and congestion -patient afebrile. Lungs are clear to auscultation.  No complaints of chest pain or shortness of breath.  Chest x-ray negative.  Acute post op blood loss anemia with underlying chronic anemia -hemoglobin stable at 7.5.  Continue with iron supplement.  Recheck hemoglobin this afternoon, if continuing to trend down may consider transfusion.  Tachycardia -no chest pain or shortness of breath.  We will give 500 cc bolus  of fluids and monitor heart rate with PT.  Acute on chronic anemia could be contributing to elevated heart rate.  Pain well controlled.  Remainder of vital signs are stable  Physical therapy -partial weightbearing left lower extremity.  Patient making good progress.  Continue with PT.  Monitor vital signs with ambulation  Care management to assist with discharge -currently waiting on acceptance from [facility].  Will order COVID test today.  Possible discharge to [facility] today pending stable vital signs, stable hemoglobin       DVT Prophylaxis - Lovenox, Foot Pumps, and TED hose Weight-Bearing as tolerated to partial weightbearing left lower  extremity  T. Rachelle Hora, PA-C [place] 05/12/2021, 9:36 AM

## 2021-05-13 LAB — CBC
HCT: 23.7 % — ABNORMAL LOW (ref 36.0–46.0)
Hemoglobin: 7.8 g/dL — ABNORMAL LOW (ref 12.0–15.0)
MCH: 30.2 pg (ref 26.0–34.0)
MCHC: 32.9 g/dL (ref 30.0–36.0)
MCV: 91.9 fL (ref 80.0–100.0)
Platelets: 195 10*3/uL (ref 150–400)
RBC: 2.58 MIL/uL — ABNORMAL LOW (ref 3.87–5.11)
RDW: 15.5 % (ref 11.5–15.5)
WBC: 4.6 10*3/uL (ref 4.0–10.5)
nRBC: 0.4 % — ABNORMAL HIGH (ref 0.0–0.2)

## 2021-05-13 LAB — GLUCOSE, CAPILLARY
Glucose-Capillary: 269 mg/dL — ABNORMAL HIGH (ref 70–99)
Glucose-Capillary: 279 mg/dL — ABNORMAL HIGH (ref 70–99)
Glucose-Capillary: 222 mg/dL — ABNORMAL HIGH (ref 70–99)

## 2021-05-13 NOTE — TOC Progression Note (Addendum)
Transition of Care [facility]) - Progression Note    Patient Details  Name: Rhonda Tyler MRN: [id number] Date of Birth: Jun 14, 1940  Transition of Care [facility]) CM/SW [place], RN Phone Number: 05/13/2021, 1:13 PM  Clinical Narrative:   The patient to go to [facility] today, EMS called she is 5th on the list,   Called to notify the [place], Unable to reach, the patient will notify her family  Expected Discharge Plan: Skilled Nursing Facility Barriers to Discharge: Continued Medical Work up  Expected Discharge Plan and Services Expected Discharge Plan: [place] Choice: [place] arrangements for the past 2 months: Single Family Home Expected Discharge Date: 05/13/21                                     Social Determinants of Health (SDOH) Interventions    Readmission Risk Interventions No flowsheet data found.

## 2021-05-13 NOTE — Plan of Care (Signed)

## 2021-05-13 NOTE — Progress Notes (Signed)
Physical Therapy Treatment Patient Details Name: Rhonda Tyler MRN: [id number] DOB: [birth year] Today's Date: 05/13/2021   History of Present Illness admitted for L TKR revision (05/07/21) secondary to peri-prosthetic distal femur fracture; PWB.    PT Comments    Pt seen this am, had been sitting up in chair for several hours requesting back to bed. MinA for sit to stand transfes to RW. Gait training with vc's for PWB compliance L LE x 20 ft. Pt completed B LE strengthening exercises with good tolerance. L knee pain 2/10 with activity. Towel roll placed under L heel to promote knee extension. Pt left in bed with bad alarm on, bed lowered, and call bell in reach.  Continue to recommend SNF placement upon d/c.   Recommendations for follow up therapy are one component of a multi-disciplinary discharge planning process, led by the attending physician.  Recommendations may be updated based on patient status, additional functional criteria and insurance authorization.  Follow Up Recommendations  Skilled nursing-short term rehab (<3 hours/day)     Assistance Recommended at Discharge Frequent or constant Supervision/Assistance  Patient can return home with the following A little help with walking and/or transfers;A little help with bathing/dressing/bathroom;Assistance with cooking/housework;Direct supervision/assist for medications management;Direct supervision/assist for financial management;Assist for transportation   Equipment Recommendations  Rolling walker (2 wheels)    Recommendations for Other Services       Precautions / Restrictions Precautions Precautions: Fall Restrictions Weight Bearing Restrictions: Yes LLE Weight Bearing: Partial weight bearing LLE Partial Weight Bearing Percentage or Pounds: 50     Mobility  Bed Mobility Overal bed mobility: Needs Assistance Bed Mobility: Sit to Supine     Supine to sit: Mod assist;Min assist (for B LE management)           Transfers Overall transfer level: Needs assistance Equipment used: Rolling walker (2 wheels) Transfers: Sit to/from Stand;Bed to chair/wheelchair/BSC Sit to Stand: Min assist           General transfer comment:  (Good demonstration of safety awareness and hand placement)    Ambulation/Gait Ambulation/Gait assistance: Min guard Gait Distance (Feet): 20 Feet Assistive device: Rolling walker (2 wheels) Gait Pattern/deviations: Step-to pattern;Antalgic;Trunk flexed;Decreased stance time - left Gait velocity: decreased     General Gait Details: Step-to gait pattern within room. 64ft using RW PWB L LE   Stairs             Wheelchair Mobility    Modified Rankin (Stroke Patients Only)       Balance                                            Cognition Arousal/Alertness: Awake/alert Behavior During Therapy: WFL for tasks assessed/performed Overall Cognitive Status: Within Functional Limits for tasks assessed                                 General Comments:  (able to follow multiple step commands)        Exercises Total Joint Exercises Ankle Circles/Pumps: AROM;10 reps;Both Quad Sets: AROM;10 reps;Supine Heel Slides: AROM;10 reps Hip ABduction/ADduction: AROM;10 reps Goniometric ROM: 2-90 in sitting    General Comments General comments (skin integrity, edema, etc.):  (Pt educated on L LE positioning in bed, HEP, and current functional goals.)      Pertinent Vitals/Pain  Pain Assessment: 0-10 Pain Score: 2  Pain Location: L knee Pain Descriptors / Indicators: Grimacing;Guarding;Aching Pain Intervention(s): Monitored during session    Home Living                          Prior Function            PT Goals (current goals can now be found in the care plan section) Acute Rehab PT Goals Patient Stated Goal: return to complete independence Progress towards PT goals: Progressing toward goals     Frequency    BID      PT Plan Current plan remains appropriate    Co-evaluation              AM-PAC PT "6 Clicks" Mobility   Outcome Measure  Help needed turning from your back to your side while in a flat bed without using bedrails?: A Little Help needed moving from lying on your back to sitting on the side of a flat bed without using bedrails?: A Little Help needed moving to and from a bed to a chair (including a wheelchair)?: A Little Help needed standing up from a chair using your arms (e.g., wheelchair or bedside chair)?: A Little Help needed to walk in hospital room?: A Little Help needed climbing 3-5 steps with a railing? : Total 6 Click Score: 16    End of Session Equipment Utilized During Treatment: Gait belt Activity Tolerance: Patient tolerated treatment well;Patient limited by fatigue Patient left: in bed;with call bell/phone within reach;with bed alarm set;with nursing/sitter in room Nurse Communication: Mobility status PT Visit Diagnosis: Muscle weakness (generalized) (M62.81);Other abnormalities of gait and mobility (R26.89);Pain Pain - Right/Left: Left Pain - part of body: Knee     Time: 1010-1035 PT Time Calculation (min) (ACUTE ONLY): 25 min  Charges:  $Gait Training: 8-22 mins $Therapeutic Exercise: 8-22 mins                    Mikel Cella, PTA    Rhonda Tyler 05/13/2021, 12:32 PM

## 2021-05-13 NOTE — Progress Notes (Signed)
Awaiting discharge order.

## 2021-05-13 NOTE — Progress Notes (Signed)
ORTHOPAEDICS PROGRESS NOTE  PATIENT NAME: LILLITH MCNEFF DOB: Dec 06, 1940  MRN: [id number]  POD # 6: Left total knee revision arthroplasty  Subjective: The patient reports an ongoing productive cough in the morning that tends to improve during the day. She has been tolerating physical therapy well.  Objective: Vital signs in last 24 hours: Temp:  [97.5 F (36.4 C)-98.3 F (36.8 C)] 97.5 F (36.4 C) (01/03 0747) Pulse Rate:  [89-104] 104 (01/03 0747) Resp:  [16-19] 16 (01/03 0747) BP: (110-151)/(53-67) 110/67 (01/03 0747) SpO2:  [92 %-100 %] 99 % (01/03 0747)  Intake/Output from previous day: 01/02 0701 - 01/03 0700 In: 237 [P.O.:237] Out: 450 [Urine:450]  Recent Labs    05/11/21 0430 05/12/21 0542 05/12/21 1429 05/13/21 0438  WBC 6.8  --  8.0 4.6  HGB 7.7* 7.5* 9.2* 7.8*  HCT 24.2* 23.0* 28.3* 23.7*  PLT 201  --  242 195  K 5.1  --   --   --   CL 94*  --   --   --   CO2 25  --   --   --   BUN 30*  --   --   --   CREATININE 0.87  --   --   --   GLUCOSE 218*  --   --   --   CALCIUM 9.0  --   --   --     EXAM General: Elderly female seen in no apparent discomfort. Lungs: clear to auscultation Cardiac: Irregular rate and rhythm. Left lower extremity: All dressing was changed.  Surgical incisions well approximated with staples.  No erythema.  Some ecchymosis to the upper thigh and lower leg. Neurologic: Awake, alert, and oriented.  Sensory and motor function are intact.  Assessment: Left total knee revision arthroplasty  Secondary diagnoses: Acute blood loss anemia secondary to surgery. Chronic kidney disease Cirrhosis of the liver Coronary artery disease Cutaneous sarcoidosis Diabetes mellitus Diabetic neuropathy Gastroesophageal reflux disease Hypertension Hyperlipidemia Irritable bowel syndrome Iron deficiency anemia Sleep apnea  Plan: New dressing was applied.  Thigh-high TED stocking was applied to the left lower extremity.  Bed was locked with  knee in extension in extension.  I applied a new [employer] Care, as the previous pad was not functioning. Plan is to go Skilled nursing facility after hospital stay. DVT Prophylaxis - Lovenox, Foot Pumps, and TED hose  Tara Rud P. Holley Bouche M.D.

## 2021-05-13 NOTE — Progress Notes (Signed)
Report was called to [facility] and given to nurse Leata Mouse. Patient will be going to room 225. Waiting on transport to the facility.

## 2021-05-15 NOTE — TOC Progression Note (Signed)
Transition of Care [facility]) - Progression Note    Patient Details  Name: [name] MRN: [id number] Date of Birth: Nov 24, 1940  Transition of Care [facility]) CM/SW [place], RN Phone Number: 05/15/2021, 8:38 AM  Clinical Narrative:   The patient's son Taziyah Iannuzzi called  the department and requested a call back.  I attempted to return his call at [phone], unable to reach, left a general voice mail and provided my contact information for a call back    Expected Discharge Plan: [place] Barriers to Discharge: Continued Medical Work up  Expected Discharge Plan and Services Expected Discharge Plan: [place] Choice: [place] arrangements for the past 2 months: Single Family Home Expected Discharge Date: 05/13/21                                     Social Determinants of Health (SDOH) Interventions    Readmission Risk Interventions No flowsheet data found.

## 2021-07-07 ENCOUNTER — Emergency Department
Admission: EM | Admit: 2021-07-07 | Discharge: 2021-07-07 | Disposition: A | Discharge status: Home / Self Care | Payer: Medicare Other | Attending: Emergency Medicine | Admitting: Emergency Medicine

## 2021-07-07 ENCOUNTER — Other Ambulatory Visit: Payer: Self-pay

## 2021-07-07 ENCOUNTER — Emergency Department: Payer: Medicare Other

## 2021-07-07 DIAGNOSIS — Y9301 Activity, walking, marching and hiking: Secondary | ICD-10-CM | POA: Diagnosis not present

## 2021-07-07 DIAGNOSIS — E119 Type 2 diabetes mellitus without complications: Secondary | ICD-10-CM | POA: Diagnosis not present

## 2021-07-07 DIAGNOSIS — I2510 Atherosclerotic heart disease of native coronary artery without angina pectoris: Secondary | ICD-10-CM | POA: Insufficient documentation

## 2021-07-07 DIAGNOSIS — S0990XA Unspecified injury of head, initial encounter: Secondary | ICD-10-CM

## 2021-07-07 DIAGNOSIS — W19XXXA Unspecified fall, initial encounter: Secondary | ICD-10-CM

## 2021-07-07 DIAGNOSIS — S0083XA Contusion of other part of head, initial encounter: Secondary | ICD-10-CM | POA: Insufficient documentation

## 2021-07-07 DIAGNOSIS — S81012A Laceration without foreign body, left knee, initial encounter: Secondary | ICD-10-CM | POA: Diagnosis not present

## 2021-07-07 DIAGNOSIS — W01118A Fall on same level from slipping, tripping and stumbling with subsequent striking against other sharp object, initial encounter: Secondary | ICD-10-CM | POA: Insufficient documentation

## 2021-07-07 MED ORDER — CEPHALEXIN 500 MG PO CAPS: 500.0000 mg | ORAL_CAPSULE | Freq: Two times a day (BID) | ORAL | 0 refills | Status: AC

## 2021-07-07 MED ORDER — LIDOCAINE HCL (PF) 1 % IJ SOLN
30.0000 mL | Freq: Once | INTRAMUSCULAR | Status: DC
  Filled 2021-07-07: qty 30

## 2021-07-07 MED ORDER — LIDOCAINE HCL (PF) 1 % IJ SOLN
10.0000 mL | Freq: Once | INTRAMUSCULAR | Status: AC
  Administered 2021-07-07: 10 mL

## 2021-07-07 MED ORDER — ONDANSETRON 4 MG PO TBDP
4.0000 mg | ORAL_TABLET | Freq: Once | ORAL | Status: AC
  Administered 2021-07-07: 4 mg via ORAL
  Filled 2021-07-07: qty 1

## 2021-07-07 MED ORDER — ACETAMINOPHEN 325 MG PO TABS
650.0000 mg | ORAL_TABLET | Freq: Once | ORAL | Status: AC
  Administered 2021-07-07: 650 mg via ORAL
  Filled 2021-07-07: qty 2

## 2021-07-07 NOTE — ED Notes (Signed)
See triage note  presents s/p fall  states hit a chair  lost her balance and fell   having pain to left knee  no deformity noted   also hit her head   no LOC

## 2021-07-07 NOTE — ED Provider Notes (Signed)
[facility] Provider Note    Event Date/Time   First MD Initiated Contact with Patient 07/07/21 1153     (approximate)   History   Fall   HPI  Rhonda Tyler is a 81 y.o. female with a history of CAD, liver cirrhosis, diabetes who presents after a mechanical fall.  Patient reports she tripped and fell forward.  She did hit her head and has a hematoma to the left forehead.  She reports she cut her leg on something as well.  Denies back pain.  No abdominal pain.  No chest pain     Physical Exam   Triage Vital Signs: ED Triage Vitals  Enc Vitals Group     BP 07/07/21 1122 105/76     Pulse Rate 07/07/21 1122 71     Resp 07/07/21 1122 18     Temp 07/07/21 1122 98 F (36.7 C)     Temp src --      SpO2 07/07/21 1122 98 %     Weight 07/07/21 1200 73.9 kg (162 lb 14.7 oz)     Height 07/07/21 1200 1.6 m (5\' 3" )     Head Circumference --      Peak Flow --      Pain Score 07/07/21 1114 5     Pain Loc --      Pain Edu? --      Excl. in [place]? --     Most recent vital signs: Vitals:   07/07/21 1122  BP: 105/76  Pulse: 71  Resp: 18  Temp: 98 F (36.7 C)  SpO2: 98%     General: Awake, no distress.  Hematoma to the left forehead with bruising CV:  Good peripheral perfusion.  No chest wall tenderness palpation Resp:  Normal effort.  CTA B Abd:  No distention.  No abdominal tenderness Other:  No vertebral chest palpation.  Normal flexion and extension of all extremities, no pain with axial load on both hips.  Left knee with 8 cm left vertical laceration overlying patella, bleeding controlled   ED Results / Procedures / Treatments   Labs (all labs ordered are listed, but only abnormal results are displayed) Labs Reviewed - No data to display   EKG     RADIOLOGY CT head and cervical spine reviewed by me, no acute abnormalities noted    PROCEDURES:  Critical Care performed:   Marland KitchenMarland KitchenLaceration Repair  Date/Time: 07/07/2021 3:58  PM Performed by: Lavonia Drafts, MD Authorized by: Lavonia Drafts, MD   Consent:    Consent obtained:  Verbal   Consent given by:  Patient   Risks discussed:  Infection, pain and poor wound healing Universal protocol:    Patient identity confirmed:  Verbally with patient Anesthesia:    Anesthesia method:  Local infiltration   Local anesthetic:  Lidocaine 1% w/o epi Laceration details:    Location:  Leg   Leg location:  L knee   Length (cm):  8 Pre-procedure details:    Preparation:  Patient was prepped and draped in usual sterile fashion Exploration:    Wound extent: foreign bodies/material     Contaminated: yes   Treatment:    Area cleansed with:  Povidone-iodine and saline   Irrigation solution:  Sterile saline   Irrigation volume:  200   Irrigation method:  Syringe   Visualized foreign bodies/material removed: yes     Debridement:  None Skin repair:    Repair method:  Sutures   Suture  size:  3-0   Suture technique:  Horizontal mattress   Number of sutures:  6 Approximation:    Approximation:  Close Repair type:    Repair type:  Intermediate Post-procedure details:    Dressing:  Non-adherent dressing   Procedure completion:  Tolerated well, no immediate complications   MEDICATIONS ORDERED IN ED: Medications  acetaminophen (TYLENOL) tablet 650 mg (650 mg Oral Given 07/07/21 1219)  ondansetron (ZOFRAN-ODT) disintegrating tablet 4 mg (4 mg Oral Given 07/07/21 1219)  lidocaine (PF) (XYLOCAINE) 1 % injection 10 mL (10 mLs Other Given by Other 07/07/21 1226)     IMPRESSION / MDM / ASSESSMENT AND PLAN / ED COURSE  I reviewed the triage vital signs and the nursing notes.    Patient presents after mechanical fall as detailed above, she reports she was reaching for a cane but fell.  Exam is notable for significant hematoma to the left forehead and laceration to the left knee, otherwise exam is reassuring  CT head and cervical spine are reassuring,   Left knee   laceration repaired as detailed above  Patient will need follow-up in 10 days for suture removal, given her diabetes I warned her that she is at high infection risk, did prescribe prophylactic antibiotics.  She will have daily dressing changes by her aide       FINAL CLINICAL IMPRESSION(S) / ED DIAGNOSES   Final diagnoses:  Fall, initial encounter  Injury of head, initial encounter  Knee laceration, left, initial encounter     Rx / DC Orders   ED Discharge Orders          Ordered    cephALEXin (KEFLEX) 500 MG capsule  2 times daily        07/07/21 1249             Note:  This document was prepared using Dragon voice recognition software and may include unintentional dictation errors.   Lavonia Drafts, MD 07/07/21 1600

## 2021-07-07 NOTE — ED Triage Notes (Addendum)
Pt comes with c/o left knee pain after fall today.  Pt states she was walking and hit a rocking chair and then lost balance and fell. Pt denies any LOC. Pt is not on thinners. Pt does have large bruising noted to left side head and forehead. Abrasion to forehead. Pt also has open laceration to left knee. Bleeding controlled at this time.

## 2021-08-06 ENCOUNTER — Other Ambulatory Visit: Payer: Self-pay | Admitting: *Deleted

## 2021-08-06 DIAGNOSIS — D509 Iron deficiency anemia, unspecified: Secondary | ICD-10-CM

## 2021-08-12 ENCOUNTER — Inpatient Hospital Stay: Payer: Medicare Other | Attending: Oncology

## 2021-08-12 DIAGNOSIS — D500 Iron deficiency anemia secondary to blood loss (chronic): Secondary | ICD-10-CM

## 2021-08-12 DIAGNOSIS — D509 Iron deficiency anemia, unspecified: Secondary | ICD-10-CM | POA: Insufficient documentation

## 2021-08-12 LAB — COMPREHENSIVE METABOLIC PANEL
ALT: 12 U/L (ref 0–44)
AST: 19 U/L (ref 15–41)
Albumin: 2.8 g/dL — ABNORMAL LOW (ref 3.5–5.0)
Alkaline Phosphatase: 81 U/L (ref 38–126)
Anion gap: 6 (ref 5–15)
BUN: 27 mg/dL — ABNORMAL HIGH (ref 8–23)
CO2: 27 mmol/L (ref 22–32)
Calcium: 9 mg/dL (ref 8.9–10.3)
Chloride: 99 mmol/L (ref 98–111)
Creatinine, Ser: 1.46 mg/dL — ABNORMAL HIGH (ref 0.44–1.00)
GFR, Estimated: 37 mL/min — ABNORMAL LOW (ref >60)
Glucose, Bld: 167 mg/dL — ABNORMAL HIGH (ref 70–99)
Potassium: 4 mmol/L (ref 3.5–5.1)
Sodium: 132 mmol/L — ABNORMAL LOW (ref 135–145)
Total Bilirubin: 0.8 mg/dL (ref 0.3–1.2)
Total Protein: 6.3 g/dL — ABNORMAL LOW (ref 6.5–8.1)

## 2021-08-12 LAB — IRON AND TIBC
Iron: 34 ug/dL (ref 28–170)
Saturation Ratios: 10 % — ABNORMAL LOW (ref 10.4–31.8)
TIBC: 343 ug/dL (ref 250–450)
UIBC: 309 ug/dL

## 2021-08-12 LAB — CBC WITH DIFFERENTIAL/PLATELET
Abs Immature Granulocytes: 0.04 10*3/uL (ref 0.00–0.07)
Basophils Absolute: 0 10*3/uL (ref 0.0–0.1)
Basophils Relative: 0 %
Eosinophils Absolute: 1.1 10*3/uL — ABNORMAL HIGH (ref 0.0–0.5)
Eosinophils Relative: 16 %
HCT: 32 % — ABNORMAL LOW (ref 36.0–46.0)
Hemoglobin: 10 g/dL — ABNORMAL LOW (ref 12.0–15.0)
Immature Granulocytes: 1 %
Lymphocytes Relative: 12 %
Lymphs Abs: 0.9 10*3/uL (ref 0.7–4.0)
MCH: 27.5 pg (ref 26.0–34.0)
MCHC: 31.3 g/dL (ref 30.0–36.0)
MCV: 87.9 fL (ref 80.0–100.0)
Monocytes Absolute: 0.5 10*3/uL (ref 0.1–1.0)
Monocytes Relative: 7 %
Neutro Abs: 4.5 10*3/uL (ref 1.7–7.7)
Neutrophils Relative %: 64 %
Platelets: 256 10*3/uL (ref 150–400)
RBC: 3.64 MIL/uL — ABNORMAL LOW (ref 3.87–5.11)
RDW: 16.7 % — ABNORMAL HIGH (ref 11.5–15.5)
WBC: 7 10*3/uL (ref 4.0–10.5)
nRBC: 0 % (ref 0.0–0.2)

## 2021-08-12 LAB — FERRITIN: Ferritin: 85 ng/mL (ref 11–307)

## 2021-08-14 ENCOUNTER — Encounter: Payer: Self-pay | Admitting: Oncology

## 2021-08-14 ENCOUNTER — Other Ambulatory Visit (INDEPENDENT_AMBULATORY_CARE_PROVIDER_SITE_OTHER): Payer: Self-pay | Admitting: Nurse Practitioner

## 2021-08-14 ENCOUNTER — Inpatient Hospital Stay: Payer: Medicare Other

## 2021-08-14 ENCOUNTER — Inpatient Hospital Stay (HOSPITAL_BASED_OUTPATIENT_CLINIC_OR_DEPARTMENT_OTHER): Payer: Medicare Other | Admitting: Oncology

## 2021-08-14 VITALS — BP 116/51 | HR 74 | Temp 98.3°F | Resp 20 | Wt 165.7 lb

## 2021-08-14 VITALS — BP 102/60 | HR 69

## 2021-08-14 DIAGNOSIS — D509 Iron deficiency anemia, unspecified: Secondary | ICD-10-CM

## 2021-08-14 DIAGNOSIS — K7469 Other cirrhosis of liver: Secondary | ICD-10-CM

## 2021-08-14 DIAGNOSIS — L97429 Non-pressure chronic ulcer of left heel and midfoot with unspecified severity: Secondary | ICD-10-CM

## 2021-08-14 DIAGNOSIS — D500 Iron deficiency anemia secondary to blood loss (chronic): Secondary | ICD-10-CM

## 2021-08-14 MED ORDER — SODIUM CHLORIDE 0.9 % IV SOLN
INTRAVENOUS | Status: DC
  Filled 2021-08-14: qty 250

## 2021-08-14 MED ORDER — IRON SUCROSE 20 MG/ML IV SOLN
200.0000 mg | Freq: Once | INTRAVENOUS | Status: AC
  Administered 2021-08-14: 200 mg via INTRAVENOUS
  Filled 2021-08-14: qty 10

## 2021-08-14 NOTE — Progress Notes (Signed)
?Hematology/Oncology Progress note ?[phone] [phone] ?  ? ? ? ?Patient Care Team: ?Kirk Ruths, MD as PCP - General (Internal Medicine) ?Earlie Server, MD as Consulting Physician (Hematology and Oncology) ? ?REFERRING PROVIDER: ?Kirk Ruths, MD  ?CHIEF COMPLAINTS/REASON FOR VISIT:  ?iron deficiency anemia ? ?HISTORY OF PRESENTING ILLNESS:  ?Rhonda Tyler is a  81 y.o.  female with PMH listed below who was referred to me for evaluation of iron deficiency anemia ?Reviewed patient's recent labs that was done at [facility] office. 05/02/2018 Labs revealed anemia with hemoglobin of 9.3, MCV 74.7, wbc 6.1, platelet 343,000 ?Iron panel showed TIBC 602, iron saturation 9, ferritin 14.  ? ?Reviewed patient's previous labs ordered by primary care physician's office, anemia is chronic onset , duration is since April 2019 ?No aggravating or improving factors. ? ?Associated signs and symptoms: Patient reports fatigue. Mild  SOB with exertion.  ?Denies weight loss, easy bruising, hematochezia, hemoptysis, hematuria. ?Context:  ?History of iron deficiency:  ?Rectal bleeding: deneis ?Hematemesis or hemoptysis : denies ?Blood in urine : denies  ?Last endoscopy: 03/08/2019 upper endoscopy showed many gastric polyps. 11/29/2017 The entire examined colon is normal. ?- No specimens collected  ?11/29/2017 Upper endosocpy, Multiple gastric polyps. The largest with stigmata of bleeding resected and retrieved. Clip (MR conditional) was placed. Pathology showed hyperplastic gastric polyps.  ?Fatigue: Yes.  ? ?She is on Aspirin '81mg'$  daily,  ? ?INTERVAL HISTORY ?Rhonda Tyler is a 81 y.o. female who has above history reviewed by me today presents for follow up visit for management of iron deficiency anemia ?Received IV Venofer treatments previously.  Tolerated well. ?During interval, patient had left total knee revision arthroplasty.  Patient developed acute postop blood loss anemia.  Patient is on oral iron  supplementation.  Today patient reports feeling tired.  Otherwise no new complaints.  She recently had a fall and presented to the emergency room. ?  ? ?Review of Systems  ?Constitutional:  Positive for fatigue. Negative for appetite change, chills and fever.  ?HENT:   Negative for hearing loss and voice change.   ?Eyes:  Negative for eye problems.  ?Respiratory:  Negative for chest tightness and cough.   ?Cardiovascular:  Negative for chest pain.  ?Gastrointestinal:  Negative for abdominal distention, abdominal pain, blood in stool and nausea.  ?Endocrine: Negative for hot flashes.  ?Genitourinary:  Negative for difficulty urinating and frequency.   ?Musculoskeletal:  Positive for arthralgias.  ?Skin:  Negative for itching and rash.  ?Neurological:  Negative for extremity weakness.  ?Hematological:  Negative for adenopathy.  ?Psychiatric/Behavioral:  Negative for confusion.   ? ?MEDICAL HISTORY:  ?Past Medical History:  ?Diagnosis Date  ? Anginal pain ([place])   ? Arthritis   ? B12 deficiency anemia   ? Cervical disc disease   ? Cervical disc disease   ? Chest pain   ? Cholecystolithiasis   ? Chronic kidney disease   ? Cirrhosis of liver ([place]) 11/2019  ? Colon polyp   ? Coronary artery disease   ? Cutaneous sarcoidosis   ? Diabetes mellitus without complication ([place])   ? Pt takes insulin  ? Diabetic neuropathy ([place])   ? GERD (gastroesophageal reflux disease)   ? Headache   ? migaines in the past  ? Hemorrhoid   ? History of diabetic neuropathy   ? Hypercalcemia   ? Hyperlipemia   ? Hypertension   ? IBS (irritable bowel syndrome)   ? Iron deficiency anemia   ? Myocardial  infarction [facility])   ? Pneumonia   ? Sleep apnea   ? ? ?SURGICAL HISTORY: ?Past Surgical History:  ?Procedure Laterality Date  ? ABDOMINAL HYSTERECTOMY    ? arthroscopic rotator cuff repair    ? BACK SURGERY    ? CARDIAC CATHETERIZATION    ? CHOLECYSTECTOMY    ? COLONOSCOPY    ? COLONOSCOPY WITH PROPOFOL N/A 11/29/2017  ? Procedure: COLONOSCOPY WITH  PROPOFOL;  Surgeon: Lollie Sails, MD;  Location: [facility] ENDOSCOPY;  Service: Endoscopy;  Laterality: N/A;  ? CORONARY ANGIOPLASTY    ? PTCA and stent of RCA  ? CORONARY ARTERY BYPASS GRAFT    ? ESOPHAGOGASTRODUODENOSCOPY    ? ESOPHAGOGASTRODUODENOSCOPY (EGD) WITH PROPOFOL N/A 11/29/2017  ? Procedure: ESOPHAGOGASTRODUODENOSCOPY (EGD) WITH PROPOFOL;  Surgeon: Lollie Sails, MD;  Location: [facility] ENDOSCOPY;  Service: Endoscopy;  Laterality: N/A;  ? ESOPHAGOGASTRODUODENOSCOPY (EGD) WITH PROPOFOL N/A 03/07/2018  ? Procedure: ESOPHAGOGASTRODUODENOSCOPY (EGD) WITH PROPOFOL;  Surgeon: Lollie Sails, MD;  Location: [facility] ENDOSCOPY;  Service: Endoscopy;  Laterality: N/A;  ? ESOPHAGOGASTRODUODENOSCOPY (EGD) WITH PROPOFOL N/A 08/16/2019  ? Procedure: ESOPHAGOGASTRODUODENOSCOPY (EGD) WITH PROPOFOL;  Surgeon: Toledo, Benay Pike, MD;  Location: [facility] ENDOSCOPY;  Service: Gastroenterology;  Laterality: N/A;  ? EXCISION OF ADNEXAL MASS    ? JOINT REPLACEMENT    ? left total knee replacement  ? OOPHORECTOMY    ? TOTAL KNEE REVISION Left 05/07/2021  ? Procedure: TOTAL KNEE REVISION;  Surgeon: Dereck Leep, MD;  Location: [facility] ORS;  Service: Orthopedics;  Laterality: Left;  ? vesicular vaginal fistula    ? ? ?SOCIAL HISTORY: ?Social History  ? ?Socioeconomic History  ? Marital status: Widowed  ?  Spouse name: Not on file  ? Number of children: Not on file  ? Years of education: Not on file  ? Highest education level: Not on file  ?Occupational History  ? Occupation: retired  ?Tobacco Use  ? Smoking status: Former  ?  Types: Cigarettes  ?  Quit date: 12/01/1970  ?  Years since quitting: 50.7  ? Smokeless tobacco: Never  ?Vaping Use  ? Vaping Use: Never used  ?Substance and Sexual Activity  ? Alcohol use: Not Currently  ? Drug use: Never  ? Sexual activity: Not on file  ?Other Topics Concern  ? Not on file  ?Social History Narrative  ? Not on file  ? ?Social Determinants of Health  ? ?Financial Resource Strain: Not on file  ?Food  Insecurity: Not on file  ?Transportation Needs: Not on file  ?Physical Activity: Not on file  ?Stress: Not on file  ?Social Connections: Not on file  ?Intimate Partner Violence: Not on file  ? ? ?FAMILY HISTORY: ?Family History  ?Problem Relation Age of Onset  ? Hypertension Mother   ? Heart attack Mother   ? Hypertension Father   ? Heart attack Father   ? Skin cancer Father   ? Heart attack Sister   ? Breast cancer Neg Hx   ? ? ?ALLERGIES:  is allergic to penicillins, codeine, levaquin [levofloxacin in d5w], lipitor [atorvastatin calcium], lopressor [metoprolol tartrate], potassium-containing compounds, procardia [nifedipine], sucralfate, tramadol, allegra [fexofenadine], naltrexone, and sulfa antibiotics. ? ?MEDICATIONS:  ?Current Outpatient Medications  ?Medication Sig Dispense Refill  ? cholecalciferol (VITAMIN D3) 25 MCG (1000 UNIT) tablet Take 1,000 Units by mouth daily.    ? Cyanocobalamin (B-12) 2500 MCG TABS Take 2,500 mcg by mouth daily.    ? enoxaparin (LOVENOX) 40 MG/0.4ML injection Inject 0.4 mLs (40 mg total)  into the skin daily for 14 days. 5.6 mL 0  ? fenofibrate 160 MG tablet Take 160 mg by mouth daily.    ? furosemide (LASIX) 40 MG tablet Take 40 mg by mouth daily.    ? HYDROcodone-acetaminophen (NORCO/VICODIN) 5-325 MG tablet Take 1-2 tablets by mouth every 6 (six) hours as needed for pain.    ? hydroxychloroquine (PLAQUENIL) 200 MG tablet Take 200 mg by mouth daily.    ? hydrOXYzine (ATARAX) 25 MG tablet Take 25 mg by mouth daily.    ? insulin NPH-regular Human (NOVOLIN 70/30) (70-30) 100 UNIT/ML injection Inject 20 Units into the skin 2 (two) times daily with a meal.    ? insulin regular (NOVOLIN R,HUMULIN R) 100 units/mL injection Inject 0-10 Units into the skin daily before lunch. Dose per sliding scale.    ? lisinopril (ZESTRIL) 5 MG tablet Take 5 mg by mouth daily.    ? loratadine (CLARITIN) 10 MG tablet Take 10 mg by mouth daily.    ? magnesium oxide (MAG-OX) 400 (240 Mg) MG tablet Take 400  mg by mouth 2 (two) times daily.    ? meclizine (ANTIVERT) 25 MG tablet Take 25 mg by mouth every 4 (four) hours as needed for dizziness.    ? Multiple Vitamin (MULTIVITAMIN WITH MINERALS) TABS tablet [place]

## 2021-08-18 ENCOUNTER — Encounter: Payer: Self-pay | Admitting: Oncology

## 2021-08-18 ENCOUNTER — Ambulatory Visit (INDEPENDENT_AMBULATORY_CARE_PROVIDER_SITE_OTHER): Payer: Medicare Other | Admitting: Nurse Practitioner

## 2021-08-18 ENCOUNTER — Encounter (INDEPENDENT_AMBULATORY_CARE_PROVIDER_SITE_OTHER): Payer: Self-pay | Admitting: Nurse Practitioner

## 2021-08-18 ENCOUNTER — Ambulatory Visit (INDEPENDENT_AMBULATORY_CARE_PROVIDER_SITE_OTHER): Payer: Medicare Other

## 2021-08-18 VITALS — BP 137/84 | HR 81 | Resp 16 | Wt 167.6 lb

## 2021-08-18 DIAGNOSIS — S91302A Unspecified open wound, left foot, initial encounter: Secondary | ICD-10-CM

## 2021-08-18 DIAGNOSIS — I10 Essential (primary) hypertension: Secondary | ICD-10-CM

## 2021-08-18 DIAGNOSIS — L97429 Non-pressure chronic ulcer of left heel and midfoot with unspecified severity: Secondary | ICD-10-CM | POA: Diagnosis not present

## 2021-08-28 ENCOUNTER — Inpatient Hospital Stay: Payer: Medicare Other

## 2021-08-28 VITALS — BP 107/49 | HR 66 | Temp 98.0°F | Resp 18

## 2021-08-28 DIAGNOSIS — D509 Iron deficiency anemia, unspecified: Secondary | ICD-10-CM

## 2021-08-28 MED ORDER — SODIUM CHLORIDE 0.9 % IV SOLN
INTRAVENOUS | Status: DC
  Filled 2021-08-28: qty 250

## 2021-08-28 MED ORDER — IRON SUCROSE 20 MG/ML IV SOLN
200.0000 mg | Freq: Once | INTRAVENOUS | Status: AC
  Administered 2021-08-28: 200 mg via INTRAVENOUS
  Filled 2021-08-28: qty 10

## 2021-08-30 ENCOUNTER — Encounter (INDEPENDENT_AMBULATORY_CARE_PROVIDER_SITE_OTHER): Payer: Self-pay | Admitting: Nurse Practitioner

## 2021-08-30 NOTE — Progress Notes (Signed)
? ?Subjective:  ? ? Patient ID: Rhonda Tyler, female    DOB: 03-24-1941, 81 y.o.   MRN: [id number] ?Chief Complaint  ?Patient presents with  ? New Patient (Initial Visit)  ?  Ref Baker consult left posterior heel ulcer,diabetic,c/o soreness around wound  ? ? ?The patient is a 81 year old female that presents today as a referral from Dr. Luana Shu in regards to a slow healing left toe wound.  The patient has been receiving excellent care from podiatry and it has been present since the second week of January.  It is improving but still somewhat tender.  She denies any claudication-like symptoms.  She denies any rest pain like symptoms. ? ?Today noninvasive studies show an ABI of 1.13 on the right and 1.21 on the left.  The patient has triphasic tibial artery waveforms bilaterally with normal toe waveforms bilaterally although the left toe waveform is the second toe due to the wound on the right foot. ? ? ?Review of Systems  ?Skin:  Positive for wound.  ?All other systems reviewed and are negative. ? ?   ?Objective:  ? Physical Exam ?Vitals reviewed.  ?HENT:  ?   Head: Normocephalic.  ?Cardiovascular:  ?   Rate and Rhythm: Normal rate.  ?   Pulses: Decreased pulses.  ?Pulmonary:  ?   Effort: Pulmonary effort is normal.  ?Skin: ?   General: Skin is warm and dry.  ?Neurological:  ?   Mental Status: She is alert and oriented to person, place, and time.  ?Psychiatric:     ?   Mood and Affect: Mood normal.     ?   Behavior: Behavior normal.     ?   Thought Content: Thought content normal.     ?   Judgment: Judgment normal.  ? ? ?BP 137/84 (BP Location: Left Arm)   Pulse 81   Resp 16   Wt 167 lb 9.6 oz (76 kg)   BMI 29.69 kg/m?  ? ?Past Medical History:  ?Diagnosis Date  ? Anginal pain ([place])   ? Arthritis   ? B12 deficiency anemia   ? Cervical disc disease   ? Cervical disc disease   ? Chest pain   ? Cholecystolithiasis   ? Chronic kidney disease   ? 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 Colon polyp   ? Coronary artery disease    ? Cutaneous sarcoidosis   ? Diabetes mellitus without complication ([place])   ? Pt takes insulin  ? Diabetic neuropathy ([place])   ? GERD (gastroesophageal reflux disease)   ? Headache   ? migaines in the past  ? Hemorrhoid   ? History of diabetic neuropathy   ? Hypercalcemia   ? Hyperlipemia   ? Hypertension   ? IBS (irritable bowel syndrome)   ? Iron deficiency anemia   ? Myocardial infarction [facility])   ? Pneumonia   ? Sleep apnea   ? ? ?Social History  ? ?Socioeconomic History  ? Marital status: Widowed  ?  Spouse name: Not on file  ? Number of children: Not on file  ? Years of education: Not on file  ? Highest education level: Not on file  ?Occupational History  ? Occupation: retired  ?Tobacco Use  ? Smoking status: Former  ?  Types: Cigarettes  ?  Quit date: 12/01/1970  ?  Years since quitting: 50.7  ? Smokeless tobacco: Never  ?Vaping Use  ? Vaping Use: Never used  ?Substance and Sexual Activity  ?  Alcohol use: Not Currently  ? Drug use: Never  ? Sexual activity: Not on file  ?Other Topics Concern  ? Not on file  ?Social History Narrative  ? Not on file  ? ?Social Determinants of Health  ? ?Financial Resource Strain: Not on file  ?Food Insecurity: Not on file  ?Transportation Needs: Not on file  ?Physical Activity: Not on file  ?Stress: Not on file  ?Social Connections: Not on file  ?Intimate Partner Violence: Not on file  ? ? ?Past Surgical History:  ?Procedure Laterality Date  ? ABDOMINAL HYSTERECTOMY    ? arthroscopic rotator cuff repair    ? BACK SURGERY    ? CARDIAC CATHETERIZATION    ? CHOLECYSTECTOMY    ? COLONOSCOPY    ? COLONOSCOPY WITH PROPOFOL N/A 11/29/2017  ? Procedure: COLONOSCOPY WITH PROPOFOL;  Surgeon: Lollie Sails, MD;  Location[facility] ENDOSCOPY;  Service: Endoscopy;  Laterality: N/A;  ? CORONARY ANGIOPLASTY    ? PTCA and stent of RCA  ? CORONARY ARTERY BYPASS GRAFT    ? ESOPHAGOGASTRODUODENOSCOPY    ? ESOPHAGOGASTRODUODENOSCOPY (EGD) WITH PROPOFOL N/A 11/29/2017  ? Procedure:  ESOPHAGOGASTRODUODENOSCOPY (EGD) WITH PROPOFOL;  Surgeon: Lollie Sails, MD;  Location: [facility] ENDOSCOPY;  Service: Endoscopy;  Laterality: N/A;  ? ESOPHAGOGASTRODUODENOSCOPY (EGD) WITH PROPOFOL N/A 03/07/2018  ? Procedure: ESOPHAGOGASTRODUODENOSCOPY (EGD) WITH PROPOFOL;  Surgeon: Lollie Sails, MD;  Location: [facility] ENDOSCOPY;  Service: Endoscopy;  Laterality: N/A;  ? ESOPHAGOGASTRODUODENOSCOPY (EGD) WITH PROPOFOL N/A 08/16/2019  ? Procedure: ESOPHAGOGASTRODUODENOSCOPY (EGD) WITH PROPOFOL;  Surgeon: Toledo, Benay Pike, MD;  Location: [facility] ENDOSCOPY;  Service: Gastroenterology;  Laterality: N/A;  ? EXCISION OF ADNEXAL MASS    ? JOINT REPLACEMENT    ? left total knee replacement  ? OOPHORECTOMY    ? TOTAL KNEE REVISION Left 05/07/2021  ? Procedure: TOTAL KNEE REVISION;  Surgeon: Dereck Leep, MD;  Location: [facility] ORS;  Service: Orthopedics;  Laterality: Left;  ? vesicular vaginal fistula    ? ? ?Family History  ?Problem Relation Age of Onset  ? Hypertension Mother   ? Heart attack Mother   ? Hypertension Father   ? Heart attack Father   ? Skin cancer Father   ? Heart attack Sister   ? Breast cancer Neg Hx   ? ? ?Allergies  ?Allergen Reactions  ? Penicillins Hives  ? Codeine Other (See Comments)  ?  Hallucinate  ? Levaquin [Levofloxacin In D5w] Other (See Comments)  ?  Unknown  ? Lipitor [Atorvastatin Calcium] Other (See Comments)  ?  Muscle pain  ? Lopressor [Metoprolol Tartrate] Other (See Comments)  ?  Heart races  ? Potassium-Containing Compounds Other (See Comments)  ?  Unknown  ? Procardia [Nifedipine] Other (See Comments)  ?  Heart races  ? Sucralfate Nausea Only  ? Tramadol Other (See Comments)  ?  Confusion  ? Allegra [Fexofenadine] Rash  ? Naltrexone Other (See Comments)  ?  Severe headache, nausea, "body flashes". ?  ? Sulfa Antibiotics Rash  ? ? ? ?  Latest Ref Rng & Units 08/12/2021  ?  1:17 PM 05/13/2021  ?  4:38 AM 05/12/2021  ?  2:29 PM  ?CBC  ?WBC 4.0 - 10.5 K/uL 7.0   4.6   8.0    ?Hemoglobin 12.0 -  15.0 g/dL 10.0   7.8   9.2    ?Hematocrit 36.0 - 46.0 % 32.0   23.7   28.3    ?Platelets 150 - 400 K/uL 256  195   242    ? ? ? ? ?CMP  ?   ?Component Value Date/Time  ? NA 132 (L) 08/12/2021 1317  ? K 4.0 08/12/2021 1317  ? CL 99 08/12/2021 1317  ? CO2 27 08/12/2021 1317  ? GLUCOSE 167 (H) 08/12/2021 1317  ? BUN 27 (H) 08/12/2021 1317  ? CREATININE 1.46 (H) 08/12/2021 1317  ? CALCIUM 9.0 08/12/2021 1317  ? PROT 6.3 (L) 08/12/2021 1317  ? ALBUMIN 2.8 (L) 08/12/2021 1317  ? AST 19 08/12/2021 1317  ? ALT 12 08/12/2021 1317  ? ALKPHOS 81 08/12/2021 1317  ? BILITOT 0.8 08/12/2021 1317  ? GFRNONAA 37 (L) 08/12/2021 1317  ? GFRAA 43 (L) 01/06/2020 1525  ? ? ? ?VAS [place] ABI WITH/WO TBI ? ?Result Date: 08/22/2021 ? LOWER EXTREMITY DOPPLER STUDY Patient Name:  ENSLIE SAHOTA  Date of Exam:   08/18/2021 Medical Rec #: [id number]      Accession #:    [id number] Date of Birth: 11/20/1940      Patient Gender: F Patient Age:   50 years Exam Location:  [facility] Procedure:      VAS [place] ABI WITH/WO TBI Referring Phys: --------------------------------------------------------------------------------  Indications: Lt great toe wound slow healing.  Performing Technologist: Concha Norway RVT  Examination Guidelines: A complete evaluation includes at minimum, Doppler waveform signals and systolic blood pressure reading at the level of bilateral brachial, anterior tibial, and posterior tibial arteries, when vessel segments are accessible. Bilateral testing is considered an integral part of a complete examination. Photoelectric Plethysmograph (PPG) waveforms and toe systolic pressure readings are included as required and additional duplex testing as needed. Limited examinations for reoccurring indications may be performed as noted.  ABI Findings: +---------+------------------+-----+---------+--------+ Right    Rt Pressure (mmHg)IndexWaveform Comment  +---------+------------------+-----+---------+--------+ Brachial 138                                       +---------+------------------+-----+---------+--------+ ATA      158               1.13 triphasic         +---------+------------------+-----+---------+--------+ PTA      154               1.10 tripha

## 2021-09-11 ENCOUNTER — Other Ambulatory Visit: Payer: Self-pay | Admitting: Oncology

## 2021-09-11 ENCOUNTER — Inpatient Hospital Stay: Payer: Medicare Other | Attending: Oncology

## 2021-09-11 VITALS — BP 141/57 | HR 62 | Temp 98.4°F | Resp 19

## 2021-09-11 DIAGNOSIS — D509 Iron deficiency anemia, unspecified: Secondary | ICD-10-CM | POA: Diagnosis not present

## 2021-09-11 MED ORDER — IRON SUCROSE 20 MG/ML IV SOLN
200.0000 mg | Freq: Once | INTRAVENOUS | Status: AC
  Administered 2021-09-11: 200 mg via INTRAVENOUS
  Filled 2021-09-11: qty 10

## 2021-09-11 MED ORDER — SODIUM CHLORIDE 0.9 % IV SOLN
Freq: Once | INTRAVENOUS | Status: AC
  Filled 2021-09-11: qty 250

## 2021-09-11 MED ORDER — SODIUM CHLORIDE 0.9 % IV SOLN: 200.0000 mg | Freq: Once | INTRAVENOUS | Status: DC

## 2021-09-11 NOTE — Patient Instructions (Signed)

## 2021-09-16 ENCOUNTER — Ambulatory Visit
Admission: RE | Admit: 2021-09-16 | Discharge: 2021-09-16 | Disposition: A | Discharge status: Home / Self Care | Payer: Medicare Other | Source: Ambulatory Visit | Attending: Internal Medicine | Admitting: Internal Medicine

## 2021-09-16 DIAGNOSIS — Z1231 Encounter for screening mammogram for malignant neoplasm of breast: Secondary | ICD-10-CM | POA: Insufficient documentation

## 2021-09-25 ENCOUNTER — Inpatient Hospital Stay: Payer: Medicare Other

## 2021-09-25 VITALS — BP 115/50 | HR 61 | Temp 98.0°F

## 2021-09-25 DIAGNOSIS — D509 Iron deficiency anemia, unspecified: Secondary | ICD-10-CM | POA: Diagnosis not present

## 2021-09-25 MED ORDER — SODIUM CHLORIDE 0.9 % IV SOLN
Freq: Once | INTRAVENOUS | Status: AC
  Filled 2021-09-25: qty 250

## 2021-09-25 MED ORDER — IRON SUCROSE 20 MG/ML IV SOLN
200.0000 mg | Freq: Once | INTRAVENOUS | Status: AC
  Administered 2021-09-25: 200 mg via INTRAVENOUS
  Filled 2021-09-25: qty 10

## 2021-09-25 MED ORDER — SODIUM CHLORIDE 0.9 % IV SOLN: 200.0000 mg | Freq: Once | INTRAVENOUS | Status: DC

## 2021-09-25 NOTE — Progress Notes (Signed)
Patient tolerated Venofer infusion well, no questions/concerns voiced. Patient stable at discharge. AVS given.   ?

## 2021-09-25 NOTE — Patient Instructions (Signed)

## 2021-10-02 ENCOUNTER — Other Ambulatory Visit: Payer: Self-pay | Admitting: Gastroenterology

## 2021-10-02 DIAGNOSIS — K7460 Unspecified cirrhosis of liver: Secondary | ICD-10-CM

## 2021-10-07 ENCOUNTER — Other Ambulatory Visit
Admission: RE | Admit: 2021-10-07 | Discharge: 2021-10-07 | Disposition: A | Discharge status: Home / Self Care | Payer: Medicare Other | Source: Ambulatory Visit | Attending: Internal Medicine | Admitting: Internal Medicine

## 2021-10-07 DIAGNOSIS — R600 Localized edema: Secondary | ICD-10-CM | POA: Insufficient documentation

## 2021-10-07 DIAGNOSIS — I25719 Atherosclerosis of autologous vein coronary artery bypass graft(s) with unspecified angina pectoris: Secondary | ICD-10-CM | POA: Insufficient documentation

## 2021-10-07 LAB — D-DIMER, QUANTITATIVE: D-Dimer, Quant: 1.36 ug/mL-FEU — ABNORMAL HIGH (ref 0.00–0.50)

## 2021-10-08 ENCOUNTER — Other Ambulatory Visit: Payer: Self-pay | Admitting: Internal Medicine

## 2021-10-08 ENCOUNTER — Ambulatory Visit
Admission: RE | Admit: 2021-10-08 | Discharge: 2021-10-08 | Disposition: A | Discharge status: Home / Self Care | Payer: Medicare Other | Source: Ambulatory Visit | Attending: Internal Medicine | Admitting: Internal Medicine

## 2021-10-08 DIAGNOSIS — R7989 Other specified abnormal findings of blood chemistry: Secondary | ICD-10-CM

## 2021-10-08 DIAGNOSIS — M7989 Other specified soft tissue disorders: Secondary | ICD-10-CM

## 2021-10-08 MED ORDER — IOHEXOL 350 MG/ML SOLN
75.0000 mL | Freq: Once | INTRAVENOUS | Status: AC | PRN
  Administered 2021-10-08: 75 mL via INTRAVENOUS

## 2021-10-13 ENCOUNTER — Other Ambulatory Visit: Payer: Self-pay

## 2021-10-13 ENCOUNTER — Observation Stay
Admission: EM | Admit: 2021-10-13 | Discharge: 2021-10-14 | Disposition: A | Discharge status: Home / Self Care | Payer: Medicare Other | Attending: Emergency Medicine | Admitting: Emergency Medicine

## 2021-10-13 ENCOUNTER — Encounter: Payer: Self-pay | Admitting: Emergency Medicine

## 2021-10-13 ENCOUNTER — Emergency Department: Payer: Medicare Other

## 2021-10-13 ENCOUNTER — Ambulatory Visit
Admission: RE | Admit: 2021-10-13 | Discharge: 2021-10-13 | Disposition: A | Discharge status: Home / Self Care | Payer: Medicare Other | Source: Ambulatory Visit | Attending: Gastroenterology | Admitting: Gastroenterology

## 2021-10-13 DIAGNOSIS — R7989 Other specified abnormal findings of blood chemistry: Secondary | ICD-10-CM | POA: Diagnosis present

## 2021-10-13 DIAGNOSIS — Z87891 Personal history of nicotine dependence: Secondary | ICD-10-CM | POA: Diagnosis not present

## 2021-10-13 DIAGNOSIS — Z96652 Presence of left artificial knee joint: Secondary | ICD-10-CM | POA: Insufficient documentation

## 2021-10-13 DIAGNOSIS — K7460 Unspecified cirrhosis of liver: Secondary | ICD-10-CM | POA: Insufficient documentation

## 2021-10-13 DIAGNOSIS — Z794 Long term (current) use of insulin: Secondary | ICD-10-CM | POA: Diagnosis not present

## 2021-10-13 DIAGNOSIS — Z9989 Dependence on other enabling machines and devices: Secondary | ICD-10-CM

## 2021-10-13 DIAGNOSIS — E785 Hyperlipidemia, unspecified: Secondary | ICD-10-CM | POA: Diagnosis present

## 2021-10-13 DIAGNOSIS — R778 Other specified abnormalities of plasma proteins: Secondary | ICD-10-CM | POA: Diagnosis not present

## 2021-10-13 DIAGNOSIS — G4733 Obstructive sleep apnea (adult) (pediatric): Secondary | ICD-10-CM

## 2021-10-13 DIAGNOSIS — I471 Supraventricular tachycardia, unspecified: Secondary | ICD-10-CM | POA: Diagnosis present

## 2021-10-13 DIAGNOSIS — E1140 Type 2 diabetes mellitus with diabetic neuropathy, unspecified: Secondary | ICD-10-CM | POA: Insufficient documentation

## 2021-10-13 DIAGNOSIS — I129 Hypertensive chronic kidney disease with stage 1 through stage 4 chronic kidney disease, or unspecified chronic kidney disease: Secondary | ICD-10-CM | POA: Insufficient documentation

## 2021-10-13 DIAGNOSIS — R0602 Shortness of breath: Secondary | ICD-10-CM | POA: Diagnosis present

## 2021-10-13 DIAGNOSIS — N1832 Chronic kidney disease, stage 3b: Secondary | ICD-10-CM | POA: Insufficient documentation

## 2021-10-13 DIAGNOSIS — Z955 Presence of coronary angioplasty implant and graft: Secondary | ICD-10-CM | POA: Insufficient documentation

## 2021-10-13 DIAGNOSIS — I10 Essential (primary) hypertension: Secondary | ICD-10-CM

## 2021-10-13 DIAGNOSIS — E871 Hypo-osmolality and hyponatremia: Secondary | ICD-10-CM

## 2021-10-13 DIAGNOSIS — I25810 Atherosclerosis of coronary artery bypass graft(s) without angina pectoris: Secondary | ICD-10-CM | POA: Diagnosis present

## 2021-10-13 DIAGNOSIS — Z79899 Other long term (current) drug therapy: Secondary | ICD-10-CM | POA: Insufficient documentation

## 2021-10-13 DIAGNOSIS — Z951 Presence of aortocoronary bypass graft: Secondary | ICD-10-CM | POA: Insufficient documentation

## 2021-10-13 DIAGNOSIS — K7469 Other cirrhosis of liver: Secondary | ICD-10-CM | POA: Insufficient documentation

## 2021-10-13 DIAGNOSIS — R079 Chest pain, unspecified: Secondary | ICD-10-CM

## 2021-10-13 DIAGNOSIS — E1129 Type 2 diabetes mellitus with other diabetic kidney complication: Secondary | ICD-10-CM | POA: Diagnosis present

## 2021-10-13 DIAGNOSIS — K7581 Nonalcoholic steatohepatitis (NASH): Secondary | ICD-10-CM | POA: Insufficient documentation

## 2021-10-13 DIAGNOSIS — Z20822 Contact with and (suspected) exposure to covid-19: Secondary | ICD-10-CM | POA: Insufficient documentation

## 2021-10-13 LAB — URINALYSIS, ROUTINE W REFLEX MICROSCOPIC
Bilirubin Urine: NEGATIVE
Glucose, UA: NEGATIVE mg/dL
Hgb urine dipstick: NEGATIVE
Ketones, ur: 5 mg/dL — AB
Nitrite: NEGATIVE
Protein, ur: NEGATIVE mg/dL
Specific Gravity, Urine: 1.015 (ref 1.005–1.030)
WBC, UA: >50 WBC/hpf — ABNORMAL HIGH (ref 0–5)
pH: 8 (ref 5.0–8.0)

## 2021-10-13 LAB — CBC WITH DIFFERENTIAL/PLATELET
Abs Immature Granulocytes: 0.02 10*3/uL (ref 0.00–0.07)
Basophils Absolute: 0 10*3/uL (ref 0.0–0.1)
Basophils Relative: 0 %
Eosinophils Absolute: 0.1 10*3/uL (ref 0.0–0.5)
Eosinophils Relative: 3 %
HCT: 39 % (ref 36.0–46.0)
Hemoglobin: 12.9 g/dL (ref 12.0–15.0)
Immature Granulocytes: 0 %
Lymphocytes Relative: 19 %
Lymphs Abs: 1.1 10*3/uL (ref 0.7–4.0)
MCH: 29.2 pg (ref 26.0–34.0)
MCHC: 33.1 g/dL (ref 30.0–36.0)
MCV: 88.2 fL (ref 80.0–100.0)
Monocytes Absolute: 0.4 10*3/uL (ref 0.1–1.0)
Monocytes Relative: 8 %
Neutro Abs: 3.9 10*3/uL (ref 1.7–7.7)
Neutrophils Relative %: 70 %
Platelets: 224 10*3/uL (ref 150–400)
RBC: 4.42 MIL/uL (ref 3.87–5.11)
RDW: 15.7 % — ABNORMAL HIGH (ref 11.5–15.5)
WBC: 5.6 10*3/uL (ref 4.0–10.5)
nRBC: 0 % (ref 0.0–0.2)

## 2021-10-13 LAB — COMPREHENSIVE METABOLIC PANEL
ALT: 12 U/L (ref 0–44)
AST: 27 U/L (ref 15–41)
Albumin: 3.8 g/dL (ref 3.5–5.0)
Alkaline Phosphatase: 108 U/L (ref 38–126)
Anion gap: 15 (ref 5–15)
BUN: 29 mg/dL — ABNORMAL HIGH (ref 8–23)
CO2: 22 mmol/L (ref 22–32)
Calcium: 10 mg/dL (ref 8.9–10.3)
Chloride: 96 mmol/L — ABNORMAL LOW (ref 98–111)
Creatinine, Ser: 1.4 mg/dL — ABNORMAL HIGH (ref 0.44–1.00)
GFR, Estimated: 39 mL/min — ABNORMAL LOW (ref >60)
Glucose, Bld: 117 mg/dL — ABNORMAL HIGH (ref 70–99)
Potassium: 4.5 mmol/L (ref 3.5–5.1)
Sodium: 133 mmol/L — ABNORMAL LOW (ref 135–145)
Total Bilirubin: 1.7 mg/dL — ABNORMAL HIGH (ref 0.3–1.2)
Total Protein: 7.2 g/dL (ref 6.5–8.1)

## 2021-10-13 LAB — PROTIME-INR
INR: 1.2 (ref 0.8–1.2)
Prothrombin Time: 15.5 seconds — ABNORMAL HIGH (ref 11.4–15.2)

## 2021-10-13 LAB — AMMONIA: Ammonia: 15 umol/L (ref 9–35)

## 2021-10-13 LAB — BRAIN NATRIURETIC PEPTIDE: B Natriuretic Peptide: 74.5 pg/mL (ref 0.0–100.0)

## 2021-10-13 LAB — TROPONIN I (HIGH SENSITIVITY)
Troponin I (High Sensitivity): 15 ng/L (ref <18)
Troponin I (High Sensitivity): 19 ng/L — ABNORMAL HIGH (ref <18)
Troponin I (High Sensitivity): 27 ng/L — ABNORMAL HIGH (ref <18)

## 2021-10-13 LAB — TSH: TSH: 1.062 u[IU]/mL (ref 0.350–4.500)

## 2021-10-13 LAB — MAGNESIUM: Magnesium: 1.9 mg/dL (ref 1.7–2.4)

## 2021-10-13 LAB — APTT: aPTT: 32 seconds (ref 24–36)

## 2021-10-13 LAB — CBG MONITORING, ED
Glucose-Capillary: 117 mg/dL — ABNORMAL HIGH (ref 70–99)
Glucose-Capillary: 119 mg/dL — ABNORMAL HIGH (ref 70–99)
Glucose-Capillary: 183 mg/dL — ABNORMAL HIGH (ref 70–99)

## 2021-10-13 LAB — SARS CORONAVIRUS 2 BY RT PCR: SARS Coronavirus 2 by RT PCR: NEGATIVE

## 2021-10-13 MED ORDER — MECLIZINE HCL 25 MG PO TABS: 25.0000 mg | ORAL_TABLET | ORAL | Status: DC | PRN

## 2021-10-13 MED ORDER — GABAPENTIN 100 MG PO CAPS
100.0000 mg | ORAL_CAPSULE | Freq: Three times a day (TID) | ORAL | Status: DC
  Administered 2021-10-13 – 2021-10-14 (×3): 100 mg via ORAL
  Filled 2021-10-13 (×3): qty 1

## 2021-10-13 MED ORDER — NITROGLYCERIN 0.4 MG SL SUBL: 0.4000 mg | SUBLINGUAL_TABLET | SUBLINGUAL | Status: DC | PRN

## 2021-10-13 MED ORDER — HYDROXYZINE HCL 25 MG PO TABS: 25.0000 mg | ORAL_TABLET | Freq: Every day | ORAL | Status: DC | PRN

## 2021-10-13 MED ORDER — ADULT MULTIVITAMIN W/MINERALS CH
1.0000 | ORAL_TABLET | Freq: Every day | ORAL | Status: DC
  Administered 2021-10-13: 1 via ORAL
  Filled 2021-10-13: qty 1

## 2021-10-13 MED ORDER — ALBUTEROL SULFATE (2.5 MG/3ML) 0.083% IN NEBU: 3.0000 mL | INHALATION_SOLUTION | RESPIRATORY_TRACT | Status: DC | PRN

## 2021-10-13 MED ORDER — ASPIRIN 81 MG PO TBEC
81.0000 mg | DELAYED_RELEASE_TABLET | Freq: Every day | ORAL | Status: DC
  Administered 2021-10-14: 81 mg via ORAL
  Filled 2021-10-13: qty 1

## 2021-10-13 MED ORDER — METOPROLOL TARTRATE 5 MG/5ML IV SOLN: 2.5000 mg | INTRAVENOUS | Status: DC | PRN

## 2021-10-13 MED ORDER — NITROGLYCERIN 0.4 MG SL SUBL
0.4000 mg | SUBLINGUAL_TABLET | SUBLINGUAL | Status: DC | PRN
Start: 2021-10-13 — End: 2021-10-14

## 2021-10-13 MED ORDER — ONDANSETRON HCL 4 MG/2ML IJ SOLN: 4.0000 mg | Freq: Three times a day (TID) | INTRAMUSCULAR | Status: DC | PRN

## 2021-10-13 MED ORDER — IOHEXOL 350 MG/ML SOLN
50.0000 mL | Freq: Once | INTRAVENOUS | Status: AC | PRN
  Administered 2021-10-13: 50 mL via INTRAVENOUS

## 2021-10-13 MED ORDER — VITAMIN B-12 1000 MCG PO TABS
2500.0000 ug | ORAL_TABLET | Freq: Every day | ORAL | Status: DC
  Administered 2021-10-14: 2500 ug via ORAL
  Filled 2021-10-13: qty 2.5

## 2021-10-13 MED ORDER — INSULIN ASPART 100 UNIT/ML IJ SOLN
0.0000 [IU] | Freq: Three times a day (TID) | INTRAMUSCULAR | Status: DC
  Administered 2021-10-14: 2 [IU] via SUBCUTANEOUS
  Administered 2021-10-14: 1 [IU] via SUBCUTANEOUS
  Filled 2021-10-13 (×2): qty 1

## 2021-10-13 MED ORDER — ENOXAPARIN SODIUM 40 MG/0.4ML IJ SOSY
40.0000 mg | PREFILLED_SYRINGE | INTRAMUSCULAR | Status: DC
  Administered 2021-10-13: 40 mg via SUBCUTANEOUS
  Filled 2021-10-13: qty 0.4

## 2021-10-13 MED ORDER — IPRATROPIUM-ALBUTEROL 0.5-2.5 (3) MG/3ML IN SOLN: 3.0000 mL | Freq: Once | RESPIRATORY_TRACT | Status: AC

## 2021-10-13 MED ORDER — ASPIRIN 81 MG PO CHEW
324.0000 mg | CHEWABLE_TABLET | Freq: Once | ORAL | Status: AC
Start: 2021-10-13 — End: 2021-10-13
  Administered 2021-10-13: 324 mg via ORAL
  Filled 2021-10-13: qty 4

## 2021-10-13 MED ORDER — INSULIN ASPART 100 UNIT/ML IJ SOLN: 0.0000 [IU] | Freq: Every day | INTRAMUSCULAR | Status: DC

## 2021-10-13 MED ORDER — VITAMIN D 25 MCG (1000 UNIT) PO TABS
1000.0000 [IU] | ORAL_TABLET | Freq: Every day | ORAL | Status: DC
  Administered 2021-10-13 – 2021-10-14 (×2): 1000 [IU] via ORAL
  Filled 2021-10-13 (×2): qty 1

## 2021-10-13 MED ORDER — LORATADINE 10 MG PO TABS
10.0000 mg | ORAL_TABLET | Freq: Every day | ORAL | Status: DC
  Administered 2021-10-13 – 2021-10-14 (×2): 10 mg via ORAL
  Filled 2021-10-13 (×2): qty 1

## 2021-10-13 MED ORDER — ASPIRIN 81 MG PO CHEW: 324.0000 mg | CHEWABLE_TABLET | Freq: Once | ORAL | Status: DC

## 2021-10-13 MED ORDER — MAGNESIUM OXIDE -MG SUPPLEMENT 400 (240 MG) MG PO TABS
400.0000 mg | ORAL_TABLET | Freq: Two times a day (BID) | ORAL | Status: DC
  Administered 2021-10-13 – 2021-10-14 (×2): 400 mg via ORAL
  Filled 2021-10-13 (×2): qty 1

## 2021-10-13 MED ORDER — ACETAMINOPHEN 325 MG PO TABS
650.0000 mg | ORAL_TABLET | Freq: Four times a day (QID) | ORAL | Status: DC | PRN
  Administered 2021-10-13 – 2021-10-14 (×2): 650 mg via ORAL
  Filled 2021-10-13 (×2): qty 2

## 2021-10-13 MED ORDER — FENTANYL CITRATE PF 50 MCG/ML IJ SOSY
50.0000 ug | PREFILLED_SYRINGE | Freq: Once | INTRAMUSCULAR | Status: AC
  Administered 2021-10-13: 50 ug via INTRAVENOUS
  Filled 2021-10-13: qty 1

## 2021-10-13 MED ORDER — FENOFIBRATE 160 MG PO TABS
160.0000 mg | ORAL_TABLET | Freq: Every day | ORAL | Status: DC
  Administered 2021-10-13 – 2021-10-14 (×2): 160 mg via ORAL
  Filled 2021-10-13 (×2): qty 1

## 2021-10-13 MED ORDER — INSULIN ASPART PROT & ASPART (70-30 MIX) 100 UNIT/ML ~~LOC~~ SUSP
10.0000 [IU] | Freq: Two times a day (BID) | SUBCUTANEOUS | Status: DC
  Administered 2021-10-14: 10 [IU] via SUBCUTANEOUS
  Filled 2021-10-13: qty 10

## 2021-10-13 MED ORDER — PROPRANOLOL HCL ER 120 MG PO CP24
120.0000 mg | ORAL_CAPSULE | Freq: Every day | ORAL | Status: DC
  Administered 2021-10-13 – 2021-10-14 (×2): 120 mg via ORAL
  Filled 2021-10-13 (×3): qty 1

## 2021-10-13 MED ORDER — LISINOPRIL 10 MG PO TABS
5.0000 mg | ORAL_TABLET | Freq: Every day | ORAL | Status: DC
  Administered 2021-10-13 – 2021-10-14 (×2): 5 mg via ORAL
  Filled 2021-10-13 (×2): qty 1

## 2021-10-13 MED ORDER — METOPROLOL TARTRATE 5 MG/5ML IV SOLN
5.0000 mg | Freq: Once | INTRAVENOUS | Status: AC
  Administered 2021-10-13: 5 mg via INTRAVENOUS
  Filled 2021-10-13: qty 5

## 2021-10-13 MED ORDER — IPRATROPIUM-ALBUTEROL 0.5-2.5 (3) MG/3ML IN SOLN
RESPIRATORY_TRACT | Status: AC
  Administered 2021-10-13: 3 mL via RESPIRATORY_TRACT
  Filled 2021-10-13: qty 3

## 2021-10-13 MED ORDER — HYDRALAZINE HCL 20 MG/ML IJ SOLN: 5.0000 mg | INTRAMUSCULAR | Status: DC | PRN

## 2021-10-13 MED ORDER — DM-GUAIFENESIN ER 30-600 MG PO TB12: 1.0000 | ORAL_TABLET | Freq: Two times a day (BID) | ORAL | Status: DC | PRN

## 2021-10-13 MED ORDER — HYDROXYCHLOROQUINE SULFATE 200 MG PO TABS
200.0000 mg | ORAL_TABLET | Freq: Every day | ORAL | Status: DC
  Administered 2021-10-13 – 2021-10-14 (×2): 200 mg via ORAL
  Filled 2021-10-13 (×2): qty 1

## 2021-10-13 MED ORDER — PANTOPRAZOLE SODIUM 40 MG PO TBEC
40.0000 mg | DELAYED_RELEASE_TABLET | Freq: Two times a day (BID) | ORAL | Status: DC
  Administered 2021-10-13 – 2021-10-14 (×2): 40 mg via ORAL
  Filled 2021-10-13 (×2): qty 1

## 2021-10-13 NOTE — Assessment & Plan Note (Addendum)
-  Follow-up abdominal ultrasound -Hold Lasix and spironolactone due to slightly worsening renal function -Check INR/PTT and ammonia level

## 2021-10-13 NOTE — Assessment & Plan Note (Signed)
Chest pain, history of CAD, elevated troponin: Troponin level 15--> 19.  Her chest pain has resolved.  Possibly due to demand ischemia secondary to SVT.  Patient had A1c 5.8 and LDL 53 on 5/30 -Trend troponin -As needed nitroglycerin -Aspirin  -Patient is on fenofibrate

## 2021-10-13 NOTE — Assessment & Plan Note (Signed)
Fenofibrate  

## 2021-10-13 NOTE — ED Triage Notes (Signed)
;  has been having short of breath for days ona and off.  Today was having scheduled [place] and chest pain started.

## 2021-10-13 NOTE — Assessment & Plan Note (Signed)
-   IV hydralazine as needed -Lisinopril 

## 2021-10-13 NOTE — Assessment & Plan Note (Signed)
Recent A1c 5.8, well controlled.  Patient is taking 70/30 insulin 20 unit twice daily and Novolin. -70/30 insulin 10 unit twice daily -Sliding scale insulin

## 2021-10-13 NOTE — Assessment & Plan Note (Signed)
Heart rate was up to 160.  Etiology is not clear.  Patient had chest pain, CT angiograms negative for PE.  Patient is labeled as having allergy to metoprolol, but the patient received 5 mg of metoprolol in ED, her heart rate improved to 100s.  She seems to have tolerated metoprolol well.  Consulted Dr. Nehemiah Massed of cardiology.  -Placed on telemetry bed for position -As needed metoprolol 2.5 mg every 2 hours for heart rate above 125 -Continue home Inderal -Check TSH, magnesium level

## 2021-10-13 NOTE — Assessment & Plan Note (Signed)
See above

## 2021-10-13 NOTE — H&P (Signed)
History and Physical    Rhonda Tyler DOB: [birth year] DOA: Patient was given at baseline, pt is independent for most of ADL.        Chief Complaint: SOB, chest pain  HPI: Rhonda Tyler is a 81 y.o. female with medical history significant of hypertension, hyperlipidemia, diabetes mellitus, CAD, CABG, GERD, gout, depression with anxiety, OSA on CPAP, IBS, CKD-IIIb, Cirrhosis due NASH, who presents with shortness breath and chest pain.  Patient is scheduled for routine abdominal ultrasound due to history of liver cirrhosis. She began to have chest pain, SOB and heart racing.  She stated her chest pain was located in the left side, pressure-like, moderate, nonradiating.  Her chest pain is resolved when I saw patient in ED.  Patient does not have cough, fever or chills. Denies nausea, vomiting, diarrhea or abdominal pain.  She states sometimes she has increased urinary frequency, no dysuria or burning on urination.  Per report, patient reported left arm numbness earlier, but she denies any numbness or tinglings in extremities to me.  No facial droop or slurred speech.  Patient was found to have SVT with heart rate up to 160.  Patient was given 1 dose of metoprolol 5 mg IV, heart rate improved to 104 in ED.  Of note, patient had left lower extremity venous Doppler 5/31 negative for DVT.  Data Reviewed and ED Course: pt was found to have WBC 5.6, BNP 74.5, troponin level 15, 19, negative COVID PCR, slightly worsening renal function, temperature normal, blood pressure 164/73, heart rate of 104, RR 28, oxygen saturation 99% on room air.  Chest x-ray showed hyperexpansion without infiltration.  CTA negative for PE.  Pt is placed on telemetry bed for observation.  Dr. Nehemiah Massed of cardiology is consulted  CT angiogram: There is no evidence of pulmonary artery embolism. There is no evidence of thoracic aortic dissection. Coronary artery disease. Dilated left atrium.   Few scattered nodules in  both lungs appear stable. There is interval appearance of ground-glass densities in the both lungs, more so in the left lower lobe suggesting possible interstitial pneumonia.   Marked spinal stenosis at T11-T12 level with no significant interval change.   Other findings as described in the body of the report.   EKG: I have personally reviewed. SVT, HR 160, LAD, poor R wave progression, anteroseptal infarction pattern.   Review of Systems:   General: no fevers, chills, no body weight gain, has fatigue HEENT: no blurry vision, hearing changes or sore throat Respiratory: has dyspnea, no coughing, wheezing CV: has chest pain, palpitations GI: no nausea, vomiting, abdominal pain, diarrhea, constipation GU: no dysuria, burning on urination, has increased urinary frequency, no hematuria  Ext: has leg edema Neuro: no unilateral weakness, numbness, or tingling, no vision change or hearing loss Skin: no rash, no skin tear. MSK: No muscle spasm, no deformity, no limitation of range of movement in spin Heme: No easy bruising.  Travel history: No recent long distant travel.   Allergy:  Allergies  Allergen Reactions   Penicillins Hives   Codeine Other (See Comments)    Hallucinate   Levaquin [Levofloxacin In D5w] Other (See Comments)    Unknown   Lipitor [Atorvastatin Calcium] Other (See Comments)    Muscle pain   Lopressor [Metoprolol Tartrate] Other (See Comments)    Heart races   Potassium-Containing Compounds Other (See Comments)    Unknown   Procardia [Nifedipine] Other (See Comments)    Heart races   Sucralfate Nausea  Only   Tramadol Other (See Comments)    Confusion   Allegra [Fexofenadine] Rash   Naltrexone Other (See Comments)    Severe headache, nausea, "body flashes".    Sulfa Antibiotics Rash    Past Medical History:  Diagnosis Date   Anginal pain ([place])    Arthritis    B12 deficiency anemia    Cervical disc disease    Cervical disc disease    Chest pain     Cholecystolithiasis    Chronic kidney disease    Cirrhosis of liver ([place]) 11/2019   Colon polyp    Coronary artery disease    Cutaneous sarcoidosis    Diabetes mellitus without complication (HCC)    Pt takes insulin   Diabetic neuropathy (HCC)    GERD (gastroesophageal reflux disease)    Headache    migaines in the past   Hemorrhoid    History of diabetic neuropathy    Hypercalcemia    Hyperlipemia    Hypertension    IBS (irritable bowel syndrome)    Iron deficiency anemia    Myocardial infarction ([place])    Pneumonia    Sleep apnea     Past Surgical History:  Procedure Laterality Date   ABDOMINAL HYSTERECTOMY     arthroscopic rotator cuff repair     BACK SURGERY     CARDIAC CATHETERIZATION     CHOLECYSTECTOMY     COLONOSCOPY     COLONOSCOPY WITH PROPOFOL N/A 11/29/2017   Procedure: COLONOSCOPY WITH PROPOFOL;  Surgeon: Lollie Sails, MD;  Location: [facility] ENDOSCOPY;  Service: Endoscopy;  Laterality: N/A;   CORONARY ANGIOPLASTY     PTCA and stent of RCA   CORONARY ARTERY BYPASS GRAFT     ESOPHAGOGASTRODUODENOSCOPY     ESOPHAGOGASTRODUODENOSCOPY (EGD) WITH PROPOFOL N/A 11/29/2017   Procedure: ESOPHAGOGASTRODUODENOSCOPY (EGD) WITH PROPOFOL;  Surgeon: Lollie Sails, MD;  Location: [facility] ENDOSCOPY;  Service: Endoscopy;  Laterality: N/A;   ESOPHAGOGASTRODUODENOSCOPY (EGD) WITH PROPOFOL N/A 03/07/2018   Procedure: ESOPHAGOGASTRODUODENOSCOPY (EGD) WITH PROPOFOL;  Surgeon: Lollie Sails, MD;  Location: [facility] ENDOSCOPY;  Service: Endoscopy;  Laterality: N/A;   ESOPHAGOGASTRODUODENOSCOPY (EGD) WITH PROPOFOL N/A 08/16/2019   Procedure: ESOPHAGOGASTRODUODENOSCOPY (EGD) WITH PROPOFOL;  Surgeon: Toledo, Benay Pike, MD;  Location: [facility] ENDOSCOPY;  Service: Gastroenterology;  Laterality: N/A;   EXCISION OF ADNEXAL MASS     JOINT REPLACEMENT     left total knee replacement   OOPHORECTOMY     TOTAL KNEE REVISION Left 05/07/2021   Procedure: TOTAL KNEE REVISION;  Surgeon: Dereck Leep, MD;  Location: [facility] ORS;  Service: Orthopedics;  Laterality: Left;   vesicular vaginal fistula      Social History:  reports that she quit smoking about 50 years ago. Her smoking use included cigarettes. She has never used smokeless tobacco. She reports that she does not currently use alcohol. She reports that she does not use drugs.  Family History:  Family History  Problem Relation Age of Onset   Hypertension Mother    Heart attack Mother    Hypertension Father    Heart attack Father    Skin cancer Father    Heart attack Sister    Breast cancer Neg Hx      Prior to Admission medications   Medication Sig Start Date End Date Taking? Authorizing Provider  acetaminophen (TYLENOL) 500 MG tablet Take 500-1,000 mg by mouth every 4 (four) hours as needed for mild pain or fever.  Patient not taking: Reported on 05/05/2021  [provider]  cholecalciferol (VITAMIN D3) 25 MCG (1000 UNIT) tablet Take 1,000 Units by mouth daily.    [provider]  Cyanocobalamin (B-12) 2500 MCG TABS Take 2,500 mcg by mouth daily.    [provider]  enoxaparin (LOVENOX) 40 MG/0.4ML injection Inject 0.4 mLs (40 mg total) into the skin daily for 14 days. 05/10/21 08/14/21  Reche Dixon, PA-C  fenofibrate 160 MG tablet Take 160 mg by mouth daily.    [provider]  furosemide (LASIX) 40 MG tablet Take 40 mg by mouth daily. 02/22/21   [provider]  HYDROcodone-acetaminophen (NORCO/VICODIN) 5-325 MG tablet Take 1-2 tablets by mouth every 6 (six) hours as needed for pain. 04/29/21   [provider]  hydroxychloroquine (PLAQUENIL) 200 MG tablet Take 200 mg by mouth daily. 12/29/19   [provider]  hydrOXYzine (ATARAX) 25 MG tablet Take 25 mg by mouth daily.    [provider]  insulin NPH-regular Human (NOVOLIN 70/30) (70-30) 100 UNIT/ML injection Inject 20 Units into the skin 2 (two) times daily with a meal.    [provider]  insulin regular (NOVOLIN R,HUMULIN R) 100 units/mL injection Inject 0-10 Units into the skin daily before lunch. Dose per sliding scale.    [provider]  lisinopril (ZESTRIL) 5 MG tablet Take 5 mg by mouth daily. 05/03/21   [provider]  loratadine (CLARITIN) 10 MG tablet Take 10 mg by mouth daily.    [provider]  magnesium oxide (MAG-OX) 400 (240 Mg) MG tablet Take 400 mg by mouth 2 (two) times daily. 03/13/21   [provider]  meclizine (ANTIVERT) 25 MG tablet Take 25 mg by mouth every 4 (four) hours as needed for dizziness. 11/30/19   [provider]  Multiple Vitamin (MULTIVITAMIN WITH MINERALS) TABS tablet Take 1 tablet by mouth at bedtime.    [provider]  nitroGLYCERIN (NITROSTAT) 0.4 MG SL tablet Place 0.4 mg under the tongue every 5 (five) minutes as needed for chest pain.    [provider]  ondansetron (ZOFRAN) 4 MG tablet Take 4 mg by mouth every 8 (eight) hours as needed for vomiting or nausea. 09/19/20   [provider]  oxyCODONE (OXY IR/ROXICODONE) 5 MG immediate release tablet Take 1 tablet (5 mg total) by mouth every 4 (four) hours as needed for severe pain. Patient not taking: Reported on 08/18/2021 05/10/21   Reche Dixon, PA-C  pantoprazole (PROTONIX) 40 MG tablet Take 40 mg by mouth 2 (two) times daily.    [provider]  propranolol ER (INDERAL LA) 120 MG 24 hr capsule Take 120 mg by mouth daily. 04/13/21   [provider]  SANTYL 250 UNIT/GM ointment Apply 1 application. topically daily. 06/30/21   [provider]  sertraline (ZOLOFT) 100 MG tablet Take 100 mg by mouth daily. Patient not taking: Reported on 05/05/2021 04/01/21   [provider]  spironolactone (ALDACTONE) 50 MG tablet Take 50 mg by mouth daily. 03/13/21   [provider]    Physical Exam: Vitals:   10/13/21 1600 10/13/21 1630 10/13/21 1700 10/13/21 1730  BP: 134/62 (!) 131/51  138/63 (!) 138/51  Pulse: 92 82 94 89  Resp: (!) 26 (!) 23 (!) 29 (!) 28  Temp:      TempSrc:      SpO2: (!) 77% 90% 100% 100%  Weight:       General: Not in acute distress HEENT:  Eyes: PERRL, EOMI, no scleral icterus.       ENT: No discharge from the ears and nose, no pharynx injection, no tonsillar enlargement.        Neck: No JVD, no bruit, no mass felt. Heme: No neck lymph node enlargement. Cardiac: S1/S2, RRR, No murmurs, No gallops or rubs. Respiratory: No rales, wheezing, rhonchi or rubs. GI: Soft, nondistended, nontender, no rebound pain, no organomegaly, BS present. GU: No hematuria Ext: has trace leg edema bilaterally. 1+DP/PT pulse bilaterally. Musculoskeletal: No joint deformities, No joint redness or warmth, no limitation of ROM in spin. Skin: No rashes.  Neuro: Alert, oriented X3, cranial nerves II-XII grossly intact, moves all extremities normally.  Psych: Patient is not psychotic, no suicidal or hemocidal ideation.  Labs on Admission: I have personally reviewed following labs and imaging studies  CBC: Recent Labs  Lab 10/13/21 1056  WBC 5.6  NEUTROABS 3.9  HGB 12.9  HCT 39.0  MCV 88.2  PLT 740   Basic Metabolic Panel: Recent Labs  Lab 10/13/21 1056  NA 133*  K 4.5  CL 96*  CO2 22  GLUCOSE 117*  BUN 29*  CREATININE 1.40*  CALCIUM 10.0   GFR: Estimated Creatinine Clearance: 30.9 mL/min (A) (by C-G formula based on SCr of 1.4 mg/dL (H)). Liver Function Tests: Recent Labs  Lab 10/13/21 1056  AST 27  ALT 12  ALKPHOS 108  BILITOT 1.7*  PROT 7.2  ALBUMIN 3.8   No results for input(s): LIPASE, AMYLASE in the last 168 hours. Recent Labs  Lab 10/13/21 1440  AMMONIA 15   Coagulation Profile: Recent Labs  Lab 10/13/21 1440  INR 1.2   Cardiac Enzymes: No results for input(s): CKTOTAL, CKMB, CKMBINDEX, TROPONINI in the last 168 hours. BNP (last 3 results) No results for input(s): PROBNP in the last 8760 hours. HbA1C: No results  for input(s): HGBA1C in the last 72 hours. CBG: Recent Labs  Lab 10/13/21 1246 10/13/21 1648  GLUCAP 117* 119*   Lipid Profile: No results for input(s): CHOL, HDL, LDLCALC, TRIG, CHOLHDL, LDLDIRECT in the last 72 hours. Thyroid Function Tests: No results for input(s): TSH, T4TOTAL, FREET4, T3FREE, THYROIDAB in the last 72 hours. Anemia Panel: No results for input(s): VITAMINB12, FOLATE, FERRITIN, TIBC, IRON, RETICCTPCT in the last 72 hours. Urine analysis:    Component Value Date/Time   COLORURINE YELLOW (A) 10/13/2021 1627   APPEARANCEUR HAZY (A) 10/13/2021 1627   LABSPEC 1.015 10/13/2021 1627   PHURINE 8.0 10/13/2021 1627   GLUCOSEU NEGATIVE 10/13/2021 1627   HGBUR NEGATIVE 10/13/2021 1627   BILIRUBINUR NEGATIVE 10/13/2021 1627   KETONESUR 5 (A) 10/13/2021 1627   PROTEINUR NEGATIVE 10/13/2021 1627   NITRITE NEGATIVE 10/13/2021 1627   LEUKOCYTESUR LARGE (A) 10/13/2021 1627   Sepsis Labs: '@LABRCNTIP'$ (procalcitonin:4,lacticidven:4) ) Recent Results (from the past 240 hour(s))  SARS Coronavirus 2 by RT PCR (hospital order, performed in [facility] hospital lab) *cepheid single result test* Anterior Nasal Swab     Status: None   Collection Time: 10/13/21 11:07 AM   Specimen: Anterior Nasal Swab  Result Value Ref Range Status   SARS Coronavirus 2 by RT PCR NEGATIVE NEGATIVE Final    Comment: (NOTE) SARS-CoV-2 target nucleic acids are NOT DETECTED.  The SARS-CoV-2 RNA is generally detectable in upper and lower respiratory specimens during the acute phase of infection. The lowest concentration of SARS-CoV-2 viral copies this assay can detect is 250 copies / mL. A negative result does not preclude SARS-CoV-2 infection and should not  be used as the sole basis for treatment or other patient management decisions.  A negative result may occur with improper specimen collection / handling, submission of specimen other than nasopharyngeal swab, presence of viral mutation(s) within  the areas targeted by this assay, and inadequate number of viral copies (<250 copies / mL). A negative result must be combined with clinical observations, patient history, and epidemiological information.  Fact Sheet for Patients:   https://www.patel.info/  Fact Sheet for Healthcare Providers: https://hall.com/  This test is not yet approved or  cleared by the [place] FDA and has been authorized for detection and/or diagnosis of SARS-CoV-2 by FDA under an Emergency Use Authorization (EUA).  This EUA will remain in effect (meaning this test can be used) for the duration of the COVID-19 declaration under Section 564(b)(1) of the Act, 21 U.S.C. section 360bbb-3(b)(1), unless the authorization is terminated or revoked sooner.  Performed at [facility], [address], [place], [place] [address]      Radiological Exams on Admission: DG Chest 2 View  Result Date: 10/13/2021 CLINICAL DATA:  Shortness of breath. EXAM: CHEST - 2 VIEW COMPARISON:  05/11/2021 FINDINGS: Patient is rotated to the left on the AP film. Lungs are hyperexpanded. The lungs are clear without focal pneumonia, edema, pneumothorax or pleural effusion. The cardiopericardial silhouette is within normal limits for size. Bones are diffusely demineralized. IMPRESSION: Hyperexpansion without acute cardiopulmonary findings. Electronically Signed   By: Misty Stanley M.D.   On: 10/13/2021 11:28   CT Angio Chest PE W and/or Wo Contrast  Result Date: 10/13/2021 CLINICAL DATA:  Chest pain, shortness of breath EXAM: CT ANGIOGRAPHY CHEST WITH CONTRAST TECHNIQUE: Multidetector CT imaging of the chest was performed using the standard protocol during bolus administration of intravenous contrast. Multiplanar CT image reconstructions and MIPs were obtained to evaluate the vascular anatomy. RADIATION DOSE REDUCTION: This exam was performed according to the departmental dose-optimization  program which includes automated exposure control, adjustment of the mA and/or kV according to patient size and/or use of iterative reconstruction technique. CONTRAST:  34m OMNIPAQUE IOHEXOL 350 MG/ML SOLN COMPARISON:  10/08/2021 FINDINGS: Cardiovascular: There is homogeneous enhancement in thoracic aorta. There is dilation left atrium. Coronary artery calcifications are seen. 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 There are no intraluminal filling defects in the pulmonary artery branches. Mediastinum/Nodes: No new significant lymphadenopathy seen. There is inhomogeneous attenuation in the thyroid with low-density nodules and coarse calcifications. Lungs/Pleura: In image 25 of series 5, there is 4 mm nodule in the left upper lobe. In image 41, there is a 2 mm nodule in the left upper lobe. In image 74there is a 3 mm nodule in the left lower lobe. r in image 82, there is 5 mm nodule in the right middle lobe. In image 19 there is 4 mm pleural-based nodule in the right upper lobe. Small nodular densities in both lungs have not changed in comparison with previous examinations dating as far back as 05/25/2013. There are faint ground-glass densities in the both mid and both lower lung fields, more so in the left lower lobe. There is no pleural effusion or pneumothorax. Upper Abdomen: Surgical clips are seen in gallbladder fossa. There is 2.2 cm nodule with coarse calcifications in the left adrenal which appears stable. Musculoskeletal: There is cervical fusion. There is marked spinal stenosis at T11-T12 level in the lower thoracic spine with no significant interval change. Review of the MIP images confirms the above findings. IMPRESSION: There is no  evidence of pulmonary artery embolism. There is no evidence of thoracic aortic dissection. Coronary artery disease. Dilated left atrium. Few scattered nodules in both lungs appear stable. There is interval appearance of ground-glass densities in the both lungs,  more so in the left lower lobe suggesting possible interstitial pneumonia. Marked spinal stenosis at T11-T12 level with no significant interval change. Other findings as described in the body of the report. Electronically Signed   By: Elmer Picker M.D.   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)   OSA on CPAP   Hyperlipemia   Type II diabetes mellitus with renal manifestations (HCC)   Chronic kidney disease, stage 3b (HCC)   Principal Problem:   SVT (supraventricular tachycardia) (HCC) Active Problems:   Chest pain   Elevated troponin   CAD (coronary artery disease), autologous vein bypass graft   Essential hypertension   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  Etiology is not clear.  Patient had chest pain, CT angiograms negative for PE.  Patient is labeled as having allergy to metoprolol, but the patient received 5 mg of metoprolol in ED, her heart rate improved to 100s.  She seems to have tolerated metoprolol well.  Consulted Dr. Nehemiah Massed of cardiology.  -Placed on telemetry bed for position -As needed metoprolol 2.5 mg every 2 hours for heart rate above 125 -Continue home Inderal -Check TSH, magnesium level   Elevated troponin See above  Chest pain Chest pain, history of CAD, elevated troponin: Troponin level 15--> 19.  Her chest pain has resolved.  Possibly due to demand ischemia secondary to SVT.  Patient had A1c 5.8 and LDL 53 on  5/30 -Trend troponin -As needed nitroglycerin -Aspirin  -Patient is on fenofibrate  CAD (coronary artery disease), autologous vein bypass graft See above  Chronic kidney disease, stage 3b (HCC) Slightly worsening than baseline.  Recent baseline creatinine 1.2.  Her creatinine is 1.40, BUN 29 today. -Hold Lasix and spironolactone -Follow-up with BMP  Type II diabetes mellitus with renal manifestations (HCC) Recent A1c 5.8, well controlled.  Patient is taking 70/30 insulin 20 unit twice daily and Novolin. 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      Disposition Plan:  Anticipate discharge back to previous environment  Consults called:  Dr. Nehemiah Massed of cardiology is consulted  Admission status and Level of care: Telemetry Cardiac:    for obs     Severity of Illness:  The appropriate patient status for this patient is OBSERVATION. Observation status is judged to be reasonable and necessary in order to  provide the required intensity of service to ensure the patient's safety. The patient's presenting symptoms, physical exam findings, and initial radiographic and laboratory data in the context of their medical condition is felt to place them at decreased risk for further clinical deterioration. Furthermore, it is anticipated that the patient will be medically stable for discharge from the hospital within 2 midnights of admission.        Date of Service 10/13/2021    [facility]   If 7PM-7AM, please contact  night-coverage [email or web address] 10/13/2021, 6:29 PM

## 2021-10-13 NOTE — ED Provider Notes (Signed)
[facility] Provider Note    Event Date/Time   First MD Initiated Contact with Patient 10/13/21 1219     (approximate)   History   Shortness of Breath and Chest Pain   HPI  Rhonda Tyler is a 81 y.o. female with history of diabetes, CAD, respiratory failure, hypertension, MI and cirrhosis among other medical problems presents to the emergency department with complaints of shortness of breath.  Patient was getting a routine ultrasound done today when she began to have chest pain and shortness of breath.  Patient states she feels like she cannot breathe.  She states she is hurting into the left shoulder.  No swelling in the legs.  Patient denies fever or chills.      Physical Exam   Triage Vital Signs: ED Triage Vitals  Enc Vitals Group     BP 10/13/21 1057 130/69     Pulse Rate 10/13/21 1057 97     Resp 10/13/21 1057 (!) 26     Temp 10/13/21 1057 97.6 F (36.4 C)     Temp Source 10/13/21 1057 Oral     SpO2 10/13/21 1057 100 %     Weight 10/13/21 1054 152 lb 8 oz (69.2 kg)     Height --      Head Circumference --      Peak Flow --      Pain Score 10/13/21 1054 3     Pain Loc --      Pain Edu? --      Excl. in [place]? --     Most recent vital signs: Vitals:   10/13/21 1330 10/13/21 1345  BP: (!) 156/102 (!) 164/73  Pulse: (!) 101 (!) 102  Resp: (!) 28 (!) 24  Temp:    SpO2: 100% 100%     General: Awake, no distress.   CV:  Good peripheral perfusion.  Intermittent tachycardia, regular rhythm Resp:  Normal effort. Lungs CTA Abd:  No distention.  Nontender Other:      ED Results / Procedures / Treatments   Labs (all labs ordered are listed, but only abnormal results are displayed) Labs Reviewed  CBC WITH DIFFERENTIAL/PLATELET - Abnormal; Notable for the following components:      Result Value   RDW 15.7 (*)    All other components within normal limits  COMPREHENSIVE METABOLIC PANEL - Abnormal; Notable for the following components:    Sodium 133 (*)    Chloride 96 (*)    Glucose, Bld 117 (*)    BUN 29 (*)    Creatinine, Ser 1.40 (*)    Total Bilirubin 1.7 (*)    GFR, Estimated 39 (*)    All other components within normal limits  CBG MONITORING, ED - Abnormal; Notable for the following components:   Glucose-Capillary 117 (*)    All other components within normal limits  TROPONIN I (HIGH SENSITIVITY) - Abnormal; Notable for the following components:   Troponin I (High Sensitivity) 19 (*)    All other components within normal limits  SARS CORONAVIRUS 2 BY RT PCR  BRAIN NATRIURETIC PEPTIDE  AMMONIA  PROTIME-INR  APTT  TROPONIN I (HIGH SENSITIVITY)  TROPONIN I (HIGH SENSITIVITY)     EKG EKG    RADIOLOGY Chest x-ray    PROCEDURES:   Procedures   MEDICATIONS ORDERED IN ED: Medications  nitroGLYCERIN (NITROSTAT) SL tablet 0.4 mg (has no administration in time range)  aspirin EC tablet 81 mg (has no administration in time  range)  aspirin chewable tablet 324 mg (has no administration in time range)  ondansetron (ZOFRAN) injection 4 mg (has no administration in time range)  acetaminophen (TYLENOL) tablet 650 mg (has no administration in time range)  hydrALAZINE (APRESOLINE) injection 5 mg (has no administration in time range)  dextromethorphan-guaiFENesin (MUCINEX DM) 30-600 MG per 12 hr tablet 1 tablet (has no administration in time range)  albuterol (PROVENTIL) (2.5 MG/3ML) 0.083% nebulizer solution 3 mL (has no administration in time range)  insulin aspart (novoLOG) injection 0-5 Units (has no administration in time range)  insulin aspart (novoLOG) injection 0-9 Units (has no administration in time range)  enoxaparin (LOVENOX) injection 40 mg (has no administration in time range)  ipratropium-albuterol (DUONEB) 0.5-2.5 (3) MG/3ML nebulizer solution 3 mL (3 mLs Nebulization Given 10/13/21 1247)  fentaNYL (SUBLIMAZE) injection 50 mcg (50 mcg Intravenous Given 10/13/21 1331)  metoprolol tartrate  (LOPRESSOR) injection 5 mg (5 mg Intravenous Given 10/13/21 1343)  iohexol (OMNIPAQUE) 350 MG/ML injection 50 mL (50 mLs Intravenous Contrast Given 10/13/21 1406)     IMPRESSION / MDM / ASSESSMENT AND PLAN / ED COURSE  I reviewed the triage vital signs and the nursing notes.                              Differential diagnosis includes, but is not limited to, MI, PE, dissection, CAP, COVID  Patient's presentation is most consistent with acute presentation with potential threat to life or bodily function.   Patient's initial labs are reassuring, her CBC, metabolic panel, troponin, COVID and BNP are all normal.  While in examining the patient her heart rate would increase to 1 20-1 30, decrease back down to 100.  Patient heart rate.  She therefore we will go ahead and order a CT for PE, repeat her troponin, check for CBG as she is diabetic and has not eaten or had her insulin today.  Patient states she feels like she needs something to help her breathe.  We will do a DuoNeb for comfort measures.    Dr. Charna Archer in to see the patient. . Patient's labs are reassuring, CBC, BMP, troponin, COVID and CBG are all normal, her BUN and creatinine are little elevated at 29 and 1.4, bilirubin is a little elevated but patient does have a history of cirrhosis which would be appropriate in the setting.  Patient continues to have runs of SVT.  Consult to cardiology, Dr. Nehemiah Massed agrees for treatment with metoprolol.  Plan is to admit the patient.  Repeat the CTA  Did discuss the metoprolol allergy with Dr. Charna Archer.  Since she states it makes her heart race which would be highly unusual we will start 5 mg of metoprolol to see if it does benefit her at this time.  Feel that the benefit we currently use the wrist.  Consult to hospitalist for admission.  Dr Porfirio Mylar admitting, area of the metoprolol being administered.   Marland Kitchen        FINAL CLINICAL IMPRESSION(S) / ED DIAGNOSES   Final diagnoses:  SVT  (supraventricular tachycardia) (HCC)  Shortness of breath     Rx / DC Orders   ED Discharge Orders     None        Note:  This document was prepared using Dragon voice recognition software and may include unintentional dictation errors.    Versie Starks, PA-C 10/13/21 1422    Blake Divine, MD 10/14/21 [phone]

## 2021-10-13 NOTE — ED Provider Triage Note (Signed)
Emergency Medicine Provider Triage Evaluation Note  Rhonda Tyler , a 81 y.o. female  was evaluated in triage.  Pt complains of SOB over a few days. [place] for liver issues.   Review of Systems  Positive: SOB Negative: Fever   Physical Exam  There were no vitals taken for this visit. Gen:   Awake, no distress   Resp:  Normal effort  MSK:   Moves extremities without difficulty  Other:    Medical Decision Making  Medically screening exam initiated at 10:49 AM.  Appropriate orders placed.  Rhonda Tyler was informed that the remainder of the evaluation will be completed by another provider, this initial triage assessment does not replace that evaluation, and the importance of remaining in the ED until their evaluation is complete.  Labs, chest xray, covid    Vanessa Sandborn, MD 10/13/21 1050

## 2021-10-13 NOTE — ED Notes (Signed)
Defib pads placed on pt as precautionary per provider.

## 2021-10-13 NOTE — Assessment & Plan Note (Signed)
Slightly worsening than baseline.  Recent baseline creatinine 1.2.  Her creatinine is 1.40, BUN 29 today. -Hold Lasix and spironolactone -Follow-up with BMP

## 2021-10-13 NOTE — ED Notes (Signed)
Pt going in and out of SVT at a rate of 140-160 bpm. RN captured EKG. RN admin metoprolol as directed by provider. Pt complains of left arm numbness and chest pain with pressure during the event. Pt does self convert and is A/Ox4 during the run of SVT.

## 2021-10-14 DIAGNOSIS — I471 Supraventricular tachycardia: Secondary | ICD-10-CM | POA: Diagnosis not present

## 2021-10-14 DIAGNOSIS — E871 Hypo-osmolality and hyponatremia: Secondary | ICD-10-CM

## 2021-10-14 DIAGNOSIS — R778 Other specified abnormalities of plasma proteins: Secondary | ICD-10-CM | POA: Diagnosis not present

## 2021-10-14 DIAGNOSIS — N1832 Chronic kidney disease, stage 3b: Secondary | ICD-10-CM | POA: Diagnosis not present

## 2021-10-14 LAB — CBG MONITORING, ED
Glucose-Capillary: 124 mg/dL — ABNORMAL HIGH (ref 70–99)
Glucose-Capillary: 188 mg/dL — ABNORMAL HIGH (ref 70–99)

## 2021-10-14 LAB — BASIC METABOLIC PANEL
Anion gap: 6 (ref 5–15)
BUN: 35 mg/dL — ABNORMAL HIGH (ref 8–23)
CO2: 25 mmol/L (ref 22–32)
Calcium: 8.8 mg/dL — ABNORMAL LOW (ref 8.9–10.3)
Chloride: 100 mmol/L (ref 98–111)
Creatinine, Ser: 1.74 mg/dL — ABNORMAL HIGH (ref 0.44–1.00)
GFR, Estimated: 30 mL/min — ABNORMAL LOW (ref >60)
Glucose, Bld: 160 mg/dL — ABNORMAL HIGH (ref 70–99)
Potassium: 4.2 mmol/L (ref 3.5–5.1)
Sodium: 131 mmol/L — ABNORMAL LOW (ref 135–145)

## 2021-10-14 LAB — TROPONIN I (HIGH SENSITIVITY): Troponin I (High Sensitivity): 34 ng/L — ABNORMAL HIGH (ref <18)

## 2021-10-14 MED ORDER — ENOXAPARIN SODIUM 30 MG/0.3ML IJ SOSY: 30.0000 mg | PREFILLED_SYRINGE | INTRAMUSCULAR | Status: DC

## 2021-10-14 MED ORDER — PROPRANOLOL HCL ER 60 MG PO CP24: 60.0000 mg | ORAL_CAPSULE | Freq: Every day | ORAL | 0 refills | Status: AC

## 2021-10-14 MED ORDER — DILTIAZEM HCL ER COATED BEADS 120 MG PO CP24
120.0000 mg | ORAL_CAPSULE | Freq: Every day | ORAL | Status: DC
  Administered 2021-10-14: 120 mg via ORAL
  Filled 2021-10-14: qty 1

## 2021-10-14 MED ORDER — DILTIAZEM HCL ER COATED BEADS 120 MG PO CP24: 120.0000 mg | ORAL_CAPSULE | Freq: Every day | ORAL | 0 refills | Status: AC

## 2021-10-14 NOTE — ED Notes (Signed)
Pt assisted to the BSC.

## 2021-10-14 NOTE — ED Notes (Addendum)
Notified Dr. Hal Hope about pt BP, no new orders at this time, will continue to monitor

## 2021-10-14 NOTE — Discharge Summary (Signed)
Physician Discharge Summary   Patient: Rhonda Tyler MRN: [id number] DOB: [birth year]  Admit date:     10/13/2021  Discharge date: 10/14/21  Discharge Physician: Sharen Hones   PCP: Kirk Ruths, MD   Recommendations at discharge:   Follow-up with PCP in 1 week. Follow-up with cardiology in 1 to 2 weeks.  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 *  Hospital Course: Rhonda Tyler is a 81 y.o. female with medical history significant of hypertension, hyperlipidemia, diabetes mellitus, CAD, CABG, GERD, gout, depression with anxiety, OSA on CPAP, IBS, CKD-IIIb, Cirrhosis due NASH, who presents with shortness breath and chest pain.  Patient was diagnosed with SVT with a heart rate of 160, received metoprolol 5 mg IV, converted to sinus.  Patient has been seen by cardiology, recommended decrease of propanolol and added diltiazem. Currently patient doing well, no additional complaints.  Medically stable to be discharged.  Patient will be follow-up with cardiology in 1 to 2 weeks.  Assessment and Plan: SVT. Minimal elevated troponin secondary to SVT. Chest pain secondary to SVT.   Coronary artery disease. Condition has improved.  Currently in sinus. Per recommendation from cardiology, I will reduce the dose of propranolol to 60 mg daily, patient was also added diltiazem 120 mg daily.  Due to relative hypotension, I discontinued lisinopril.  Chronic kidney disease stage IIIb. Renal function appears to be stable.  Liver cirrhosis  secondary to [place]. Follow-up with PCP as outpatient.  Essential hypertension. Continue propanolol and diltiazem, discontinue lisinopril.        Consultants: Cardiology Procedures performed: None  Disposition: Home Diet recommendation:  Discharge Diet Orders (From admission, onward)     Start     Ordered   10/14/21 0000  Diet - low sodium heart healthy        10/14/21 1322           Cardiac diet DISCHARGE MEDICATION: Allergies as of 10/14/2021       Reactions   Penicillins Hives   Codeine Other (See Comments)   Hallucinate   Levaquin [levofloxacin In D5w] Other (See Comments)   Unknown   Lipitor [atorvastatin Calcium] Other (See Comments)   Muscle pain   Lopressor [metoprolol Tartrate] Other (See Comments)   Heart races   Potassium-containing Compounds Other (See Comments)   Unknown   Procardia [nifedipine] Other (See Comments)   Heart races   Sucralfate Nausea Only   Tramadol Other (See Comments)   Confusion   Allegra [fexofenadine] Rash   Naltrexone Other (See Comments)   Severe headache, nausea, "body flashes".   Sulfa Antibiotics Rash        Medication List     STOP taking these medications    enoxaparin 40 MG/0.4ML injection Commonly known as: LOVENOX   HYDROcodone-acetaminophen 5-325 MG tablet Commonly known as: NORCO/VICODIN   insulin regular 100 units/mL injection Commonly known as: NOVOLIN R   lisinopril 5 MG tablet Commonly known as: ZESTRIL   oxyCODONE 5 MG immediate release tablet Commonly known as: Oxy IR/ROXICODONE   sertraline 100  MG tablet Commonly known as: ZOLOFT       TAKE these medications    acetaminophen 500 MG tablet Commonly known as: TYLENOL Take 500-1,000 mg by mouth every 4 (four) hours as needed for mild pain or fever.   B-12 2500 MCG Tabs Take 2,500 mcg by mouth daily.   cholecalciferol 25 MCG (1000 UNIT) tablet Commonly known as: VITAMIN D3 Take 1,000 Units by mouth daily.   diltiazem 120 MG 24 hr  capsule Commonly known as: CARDIZEM CD Take 1 capsule (120 mg total) by mouth daily. Start taking on: October 15, 2021   fenofibrate 160 MG tablet Take 160 mg by mouth daily.   furosemide 40 MG tablet Commonly known as: LASIX Take 40 mg by mouth daily.   gabapentin 100 MG capsule Commonly known as: NEURONTIN Take 100 mg by mouth 3 (three) times daily.   hydroxychloroquine 200 MG tablet Commonly known as: PLAQUENIL Take 200 mg by mouth daily.   hydrOXYzine 25 MG tablet Commonly known as: ATARAX Take 25 mg by mouth daily.   insulin NPH-regular Human (70-30) 100 UNIT/ML injection Inject 20 Units into the skin 2 (two) times daily with a meal.   loratadine 10 MG tablet Commonly known as: CLARITIN Take 10 mg by mouth daily.   magnesium oxide 400 (240 Mg) MG tablet Commonly known as: MAG-OX Take 400 mg by mouth 2 (two) times daily.   meclizine 25 MG tablet Commonly known as: ANTIVERT Take 25 mg by mouth every 4 (four) hours as needed for dizziness.   multivitamin with minerals Tabs tablet Take 1 tablet by mouth at bedtime.   nitroGLYCERIN 0.4 MG SL tablet Commonly known as: NITROSTAT Place 0.4 mg under the tongue every 5 (five) minutes as needed for chest pain.   ondansetron 4 MG tablet Commonly known as: ZOFRAN Take 4 mg by mouth every 8 (eight) hours as needed for vomiting or nausea.   pantoprazole 40 MG tablet Commonly known as: PROTONIX Take 40 mg by mouth 2 (two) times daily.   propranolol ER 60 MG 24 hr capsule Commonly known as: Inderal LA Take 1 capsule (60 mg total) by mouth daily. What changed:  medication strength how much to take   Santyl 250 UNIT/GM ointment Generic drug: collagenase Apply 1 application. topically daily.   spironolactone 50 MG tablet Commonly known as: ALDACTONE Take 50 mg by mouth daily.        Follow-up Information     Kirk Ruths, MD Follow up in 1 week(s).   Specialty: Internal Medicine Contact  information: [place] [address] [phone]         Teodoro Spray, MD Follow up in 2 week(s).   Specialty: Cardiology Contact information: [address] [place] [address] [phone]                Discharge Exam: Filed Weights   10/13/21 1054  Weight: 69.2 kg   General exam: Appears calm and comfortable  Respiratory system: Clear to auscultation. Respiratory effort normal. Cardiovascular system: S1 & S2 heard, RRR. No JVD, murmurs, rubs, gallops or clicks. No pedal edema. Gastrointestinal system: Abdomen is nondistended, soft and nontender. No organomegaly or masses felt. Normal bowel sounds heard. Central nervous system: Alert and oriented. No focal neurological deficits. Extremities: Symmetric 5 x 5 power. Skin: No rashes, lesions or ulcers Psychiatry: Judgement and insight appear normal. Mood & affect appropriate.    Condition at discharge:  good  The results of significant diagnostics from this hospitalization (including imaging, microbiology, ancillary and laboratory) are listed below for reference.   Imaging Studies: DG Chest 2 View  Result Date: 10/13/2021 CLINICAL DATA:  Shortness of breath. EXAM: CHEST - 2 VIEW COMPARISON:  05/11/2021 FINDINGS: Patient is rotated to the left on the AP film. Lungs are hyperexpanded. The lungs are clear without focal pneumonia, edema, pneumothorax or pleural effusion. The cardiopericardial silhouette is within normal limits for size. Bones are diffusely demineralized. IMPRESSION: Hyperexpansion without acute cardiopulmonary findings. Electronically Signed   By: Misty Stanley M.D.   On: 10/13/2021 11:28   CT Angio Chest PE W and/or Wo Contrast  Result Date: 10/13/2021 CLINICAL DATA:  Chest pain, shortness of breath EXAM: CT ANGIOGRAPHY CHEST WITH CONTRAST TECHNIQUE: Multidetector CT imaging of the chest was performed using the standard protocol during bolus administration  of intravenous contrast. Multiplanar CT image reconstructions and MIPs were obtained to evaluate the vascular anatomy. RADIATION DOSE REDUCTION: This exam was performed according to the departmental dose-optimization program which includes automated exposure control, adjustment of the mA and/or kV according to patient size and/or use of iterative reconstruction technique. CONTRAST:  44m OMNIPAQUE IOHEXOL 350 MG/ML SOLN COMPARISON:  10/08/2021 FINDINGS: Cardiovascular: There is homogeneous enhancement in thoracic aorta. There is dilation left atrium. Coronary artery calcifications are seen. 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 There are no intraluminal filling defects in the pulmonary artery branches. Mediastinum/Nodes: No new significant lymphadenopathy seen. There is inhomogeneous attenuation in the thyroid with low-density nodules and coarse calcifications. Lungs/Pleura: In image 25 of series 5, there is 4 mm nodule in the left upper lobe. In image 41, there is a 2 mm nodule in the left upper lobe. In image 740there is a 3 mm nodule in the left lower lobe. r in image 82, there is 5 mm nodule in the right middle lobe. In image 19 there is 4 mm pleural-based nodule in the right upper lobe. Small nodular densities in both lungs have not changed in comparison with previous examinations dating as far back as 05/25/2013. There are faint ground-glass densities in the both mid and both lower lung fields, more so in the left lower lobe. There is no pleural effusion or pneumothorax. Upper Abdomen: Surgical clips are seen in gallbladder fossa. There is 2.2 cm nodule with coarse calcifications in the left adrenal which appears stable. Musculoskeletal: There is cervical fusion. There is marked spinal stenosis at T11-T12 level in the lower thoracic spine with no significant interval change. Review of the MIP images confirms the above findings. IMPRESSION: There is no evidence of pulmonary artery embolism. There is  no evidence of thoracic aortic dissection. Coronary artery disease. Dilated left atrium. Few scattered nodules in both lungs appear stable. There is interval appearance of ground-glass densities in the both lungs, more so in the left lower lobe suggesting possible interstitial pneumonia. Marked spinal stenosis at T11-T12 level with no significant interval change. Other findings as described in the body of the report. Electronically Signed   By: PElmer PickerM.D.   On: 10/13/2021 14:41   CT Angio Chest Pulmonary Embolism (PE) W or WO Contrast  Result Date: 10/08/2021 CLINICAL DATA:  Shortness of breath.  Elevated D-dimer. EXAM: CT ANGIOGRAPHY CHEST WITH CONTRAST TECHNIQUE: Multidetector CT imaging of the chest was performed using the standard protocol during bolus administration of intravenous contrast. Multiplanar CT image reconstructions and MIPs were obtained to evaluate the vascular anatomy. RADIATION DOSE REDUCTION:  This exam was performed according to the departmental dose-optimization program which includes automated exposure control, adjustment of the mA and/or kV according to patient size and/or use of iterative reconstruction technique. CONTRAST:  29m OMNIPAQUE IOHEXOL 350 MG/ML SOLN COMPARISON:  Chest CT from 2015 FINDINGS: Cardiovascular: The heart is normal in size. No pericardial effusion. The aorta is normal in caliber. No dissection. Scattered atherosclerotic calcifications mainly at the aortic arch. Advanced three-vessel coronary artery calcifications are noted. The pulmonary arterial tree is well opacified. No filling defects to suggest pulmonary embolism. Mediastinum/Nodes: No mediastinal or hilar mass or lymphadenopathy. The esophagus is grossly normal. Lungs/Pleura: No acute pulmonary findings. No worrisome pulmonary lesions or pulmonary nodules. There is a stable subpleural nodule in the right middle lobe consistent with a benign lymph node. No pleural effusions or pleural nodules.  Upper Abdomen: No significant upper abdominal findings. There is a stable appearing 2.5 cm left adrenal gland lesion containing central calcifications. This was shown to be a benign adenoma on prior abdominal MRI examination from 11/24/2020. Musculoskeletal: No breast masses, supraclavicular or axillary adenopathy. 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 No change since 2014. No worrisome bone lesions. Review of the MIP images confirms the above findings. IMPRESSION: 1. No CT findings for pulmonary embolism. 2. Normal caliber thoracic aorta, no dissection. 3. Advanced three-vessel coronary artery calcifications. 4. No acute pulmonary findings. 5. Stable left adrenal gland adenoma. 6. Aortic atherosclerosis. Aortic Atherosclerosis (ICD10-I70.0). Electronically Signed   By: PMarijo SanesM.D.   On: 10/08/2021 14:57   [place]domen Complete  Result Date: 10/14/2021 CLINICAL DATA:  Cirrhosis.  [place]illance EXAM: ABDOMEN ULTRASOUND COMPLETE COMPARISON:  04/18/2021.  Chest CT of 10/13/2021 FINDINGS: Gallbladder: Surgically absent Common bile duct: Diameter: Normal, 3 mm Liver: Irregular capsule with coarsened echotexture. No focal liver lesion. Portal vein is patent on color Doppler imaging with normal direction of blood flow towards the liver. IVC: No abnormality visualized. Pancreas: Poorly visualized due to overlying bowel gas. Spleen: Size and appearance within normal limits. Right Kidney: Length: 9.4 cm. Echogenicity within normal limits. No mass or hydronephrosis visualized. Left Kidney: Length: 8.9 cm echogenicity within normal limits. No mass or hydronephrosis visualized. 8 mm interpolar echogenic focus could be dystrophic or represent a peripheral collecting system stone. Abdominal aorta: No aneurysm visualized. Other findings: None. 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 Portions of anatomy obscured by overlying bowel gas. Left renal  dystrophic calcification versus less likely collecting system calculus. Electronically Signed   By: KAbigail MiyamotoM.D.   On: 10/14/2021 12:28   [place]ous Img Lower Unilateral Left (DVT)  Result Date: 10/08/2021 CLINICAL DATA:  Left lower extremity edema and elevated D-dimer. EXAM: LEFT LOWER EXTREMITY VENOUS DOPPLER ULTRASOUND TECHNIQUE: Gray-scale sonography with graded compression, as well as color Doppler and duplex ultrasound were performed to evaluate the lower extremity deep venous systems from the level of the common femoral vein and including the common femoral, femoral, profunda femoral, popliteal and calf veins including the posterior tibial, peroneal and gastrocnemius veins when visible. The superficial great saphenous vein was also interrogated. Spectral Doppler was utilized to evaluate flow at rest and with distal augmentation maneuvers in the common femoral, femoral and popliteal veins. COMPARISON:  None Available. FINDINGS: Contralateral Common Femoral Vein: Respiratory phasicity is normal and symmetric with the symptomatic side. No evidence of thrombus. Normal compressibility. Common Femoral Vein: No evidence of thrombus. Normal compressibility, respiratory phasicity and response to augmentation. Saphenofemoral Junction: No evidence  of thrombus. Normal compressibility and flow on color Doppler imaging. Profunda Femoral Vein: No evidence of thrombus. Normal compressibility and flow on color Doppler imaging. Femoral Vein: No evidence of thrombus. Normal compressibility, respiratory phasicity and response to augmentation. Popliteal Vein: No evidence of thrombus. Normal compressibility, respiratory phasicity and response to augmentation. Calf Veins: No evidence of thrombus. Normal compressibility and flow on color Doppler imaging. Superficial Great Saphenous Vein: No evidence of thrombus. Normal compressibility. Venous Reflux:  None. Other Findings: No evidence of superficial thrombophlebitis or  abnormal fluid collection. IMPRESSION: No evidence of left lower extremity deep venous thrombosis. Electronically Signed   By: Aletta Edouard M.D.   On: 10/08/2021 13:58   MM 3D SCREEN BREAST BILATERAL  Result Date: 09/17/2021 CLINICAL DATA:  Screening. EXAM: DIGITAL SCREENING BILATERAL MAMMOGRAM WITH TOMOSYNTHESIS AND CAD TECHNIQUE: Bilateral screening digital craniocaudal and mediolateral oblique mammograms were obtained. Bilateral screening digital breast tomosynthesis was performed. The images were evaluated with computer-aided detection. COMPARISON:  Previous exam(s). ACR Breast Density Category b: There are scattered areas of fibroglandular density. FINDINGS: Evaluation, particularly of the left breast, is limited by patient clinical condition/difficulty with positioning. Within the limitations of the study there are no findings suspicious for malignancy. IMPRESSION: No mammographic evidence of malignancy. A result letter of this screening mammogram will be mailed directly to the patient. RECOMMENDATION: Screening mammogram in one year. (Code:[id number]) BI-RADS CATEGORY  1: Negative. Electronically Signed   By: Audie Pinto M.D.   On: 09/17/2021 08:02    Microbiology: Results for orders placed or performed during the hospital encounter of 10/13/21  SARS Coronavirus 2 by RT PCR (hospital order, performed in [facility] hospital lab) *cepheid single result test* Anterior Nasal Swab     Status: None   Collection Time: 10/13/21 11:07 AM   Specimen: Anterior Nasal Swab  Result Value Ref Range Status   SARS Coronavirus 2 by RT PCR NEGATIVE NEGATIVE Final    Comment: (NOTE) SARS-CoV-2 target nucleic acids are NOT DETECTED.  The SARS-CoV-2 RNA is generally detectable in upper and lower respiratory specimens during the acute phase of infection. The lowest concentration of SARS-CoV-2 viral copies this assay can detect is 250 copies / mL. A negative result does not preclude SARS-CoV-2  infection and should not be used as the sole basis for treatment or other patient management decisions.  A negative result may occur with improper specimen collection / handling, submission of specimen other than nasopharyngeal swab, presence of viral mutation(s) within the areas targeted by this assay, and inadequate number of viral copies (<250 copies / mL). A negative result must be combined with clinical observations, patient history, and epidemiological information.  Fact Sheet for Patients:   https://www.patel.info/  Fact Sheet for Healthcare Providers: https://hall.com/  This test is not yet approved or  cleared by the [place] FDA and has been authorized for detection and/or diagnosis of SARS-CoV-2 by FDA under an Emergency Use Authorization (EUA).  This EUA will remain in effect (meaning this test can be used) for the duration of the COVID-19 declaration under Section 564(b)(1) of the Act, 21 U.S.C. section 360bbb-3(b)(1), unless the authorization is terminated or revoked sooner.  Performed at [facility], [address] [place], [place] [address]     Labs: CBC: Recent Labs  Lab 10/13/21 1056  WBC 5.6  NEUTROABS 3.9  HGB 12.9  HCT 39.0  MCV 88.2  PLT 585   Basic Metabolic Panel: Recent Labs  Lab 10/13/21 1056 10/13/21 1440 10/14/21 0431  NA 133*  --  131*  K 4.5  --  4.2  CL 96*  --  100  CO2 22  --  25  GLUCOSE 117*  --  160*  BUN 29*  --  35*  CREATININE 1.40*  --  1.74*  CALCIUM 10.0  --  8.8*  MG  --  1.9  --    Liver Function Tests: Recent Labs  Lab 10/13/21 1056  AST 27  ALT 12  ALKPHOS 108  BILITOT 1.7*  PROT 7.2  ALBUMIN 3.8   CBG: Recent Labs  Lab 10/13/21 1246 10/13/21 1648 10/13/21 2213  GLUCAP 117* 119* 183*    Discharge time spent: greater than 30 minutes.  Signed: Sharen Hones, MD [facility] 10/14/2021

## 2021-10-14 NOTE — Consult Note (Signed)
[facility] Consultation Note  Patient ID: GELENA KLOSINSKI, MRN: [id number], DOB/AGE: [id number] 79 y.o. Admit date: 10/13/2021   Date of Consult: 10/14/2021 Primary Physician: Kirk Ruths, MD Primary Cardiologist: fath  Chief Complaint:  Chief Complaint  Patient presents with   Shortness of Breath   Chest Pain   Reason for Consult:  Supraventricular tachycardia  HPI: 81 y.o. female with known coronary artery disease status post coronary bypass graft hypertension hyperlipidemia sleep apnea and chronic kidney disease with creatinine of 1-2.  Patient has had done very well from cardiovascular standpoint over the many years with appropriate medication management.  Recently she has had some waxing and waning weakness and fatigue as well as some sort of dizziness.  This is accompanied by some palpitations irregular heartbeat.  When seen in the emergency room for some of these nonspecific symptoms she was noticed to have a supraventricular tachycardia with ST depression throughout the entire precordium at 160 bpm.  There was no evidence of discernible P waves or primary atrial rhythm although this was broken by intravenous metoprolol and has not reoccurred.  The patient overall has felt fairly well since then she has had less evidence of irregular heartbeat.  She apparently has taking propranolol in the past and this metoprolol did help although there is some apparent side effects of metoprolol as well.  There was no evidence of atrial fibrillation or true atrial flutter.  Has not been any true angina and her troponin is peaked at 19.  Repeat EKG shows normal sinus rhythm otherwise normal EKG  Past Medical History:  Diagnosis Date   Anginal pain (HCC)    Arthritis    B12 deficiency anemia    Cervical disc disease    Cervical disc disease    Chest pain    Cholecystolithiasis    Chronic kidney disease    Cirrhosis of liver ([place]) 11/2019   Colon polyp    Coronary artery disease     Cutaneous sarcoidosis    Diabetes mellitus without complication (HCC)    Pt takes insulin   Diabetic neuropathy (HCC)    GERD (gastroesophageal reflux disease)    Headache    migaines in the past   Hemorrhoid    History of diabetic neuropathy    Hypercalcemia    Hyperlipemia    Hypertension    IBS (irritable bowel syndrome)    Iron deficiency anemia    Myocardial infarction ([place])    Pneumonia    Sleep apnea       Surgical History:  Past Surgical History:  Procedure Laterality Date   ABDOMINAL HYSTERECTOMY     arthroscopic rotator cuff repair     BACK SURGERY     CARDIAC CATHETERIZATION     CHOLECYSTECTOMY     COLONOSCOPY     COLONOSCOPY WITH PROPOFOL N/A 11/29/2017   Procedure: COLONOSCOPY WITH PROPOFOL;  Surgeon: Lollie Sails, MD;  Location: [facility] ENDOSCOPY;  Service: Endoscopy;  Laterality: N/A;   CORONARY ANGIOPLASTY     PTCA and stent of RCA   CORONARY ARTERY BYPASS GRAFT     ESOPHAGOGASTRODUODENOSCOPY     ESOPHAGOGASTRODUODENOSCOPY (EGD) WITH PROPOFOL N/A 11/29/2017   Procedure: ESOPHAGOGASTRODUODENOSCOPY (EGD) WITH PROPOFOL;  Surgeon: Lollie Sails, MD;  Location: [facility] ENDOSCOPY;  Service: Endoscopy;  Laterality: N/A;   ESOPHAGOGASTRODUODENOSCOPY (EGD) WITH PROPOFOL N/A 03/07/2018   Procedure: ESOPHAGOGASTRODUODENOSCOPY (EGD) WITH PROPOFOL;  Surgeon: Lollie Sails, MD;  Location: [facility] ENDOSCOPY;  Service: Endoscopy;  Laterality: N/A;  ESOPHAGOGASTRODUODENOSCOPY (EGD) WITH PROPOFOL N/A 08/16/2019   Procedure: ESOPHAGOGASTRODUODENOSCOPY (EGD) WITH PROPOFOL;  Surgeon: Toledo, Benay Pike, MD;  Location: [facility] ENDOSCOPY;  Service: Gastroenterology;  Laterality: N/A;   EXCISION OF ADNEXAL MASS     JOINT REPLACEMENT     left total knee replacement   OOPHORECTOMY     TOTAL KNEE REVISION Left 05/07/2021   Procedure: TOTAL KNEE REVISION;  Surgeon: Dereck Leep, MD;  Location: [facility] ORS;  Service: Orthopedics;  Laterality: Left;   vesicular vaginal fistula        Home Meds: Prior to Admission medications   Medication Sig Start Date End Date Taking? Authorizing Provider  acetaminophen (TYLENOL) 500 MG tablet Take 500-1,000 mg by mouth every 4 (four) hours as needed for mild pain or fever.   Yes [provider]  cholecalciferol (VITAMIN D3) 25 MCG (1000 UNIT) tablet Take 1,000 Units by mouth daily.   Yes [provider]  Cyanocobalamin (B-12) 2500 MCG TABS Take 2,500 mcg by mouth daily.   Yes [provider]  fenofibrate 160 MG tablet Take 160 mg by mouth daily.   Yes [provider]  furosemide (LASIX) 40 MG tablet Take 40 mg by mouth daily. 02/22/21  Yes [provider]  gabapentin (NEURONTIN) 100 MG capsule Take 100 mg by mouth 3 (three) times daily. 09/25/21  Yes [provider]  hydroxychloroquine (PLAQUENIL) 200 MG tablet Take 200 mg by mouth daily. 12/29/19  Yes [provider]  insulin NPH-regular Human (NOVOLIN 70/30) (70-30) 100 UNIT/ML injection Inject 20 Units into the skin 2 (two) times daily with a meal.   Yes [provider]  lisinopril (ZESTRIL) 5 MG tablet Take 5 mg by mouth daily. 05/03/21  Yes [provider]  loratadine (CLARITIN) 10 MG tablet Take 10 mg by mouth daily.   Yes [provider]  magnesium oxide (MAG-OX) 400 (240 Mg) MG tablet Take 400 mg by mouth 2 (two) times daily. 03/13/21  Yes [provider]  Multiple Vitamin (MULTIVITAMIN WITH MINERALS) TABS tablet Take 1 tablet by mouth at bedtime.   Yes [provider]  pantoprazole (PROTONIX) 40 MG tablet Take 40 mg by mouth 2 (two) times daily.   Yes [provider]  propranolol ER (INDERAL LA) 120 MG 24 hr capsule Take 120 mg by mouth daily. 04/13/21  Yes [provider]  spironolactone (ALDACTONE) 50 MG tablet Take 50 mg by mouth daily. 03/13/21  Yes [provider]  enoxaparin (LOVENOX) 40 MG/0.4ML injection Inject 0.4 mLs (40 mg total) into the  skin daily for 14 days. 05/10/21 08/14/21  Reche Dixon, PA-C  HYDROcodone-acetaminophen (NORCO/VICODIN) 5-325 MG tablet Take 1-2 tablets by mouth every 6 (six) hours as needed for pain. Patient not taking: Reported on 10/13/2021 04/29/21   [provider]  hydrOXYzine (ATARAX) 25 MG tablet Take 25 mg by mouth daily.    [provider]  insulin regular (NOVOLIN R,HUMULIN R) 100 units/mL injection Inject 0-10 Units into the skin daily before lunch. Dose per sliding scale. Patient not taking: Reported on 10/13/2021    [provider]  meclizine (ANTIVERT) 25 MG tablet Take 25 mg by mouth every 4 (four) hours as needed for dizziness. 11/30/19   [provider]  nitroGLYCERIN (NITROSTAT) 0.4 MG SL tablet Place 0.4 mg under the tongue every 5 (five) minutes as needed for chest pain.    [provider]  ondansetron (ZOFRAN) 4 MG tablet Take 4 mg by mouth every 8 (eight)  hours as needed for vomiting or nausea. 09/19/20   [provider]  oxyCODONE (OXY IR/ROXICODONE) 5 MG immediate release tablet Take 1 tablet (5 mg total) by mouth every 4 (four) hours as needed for severe pain. Patient not taking: Reported on 08/18/2021 05/10/21   Reche Dixon, PA-C  SANTYL 250 UNIT/GM ointment Apply 1 application. topically daily. 06/30/21   [provider]  sertraline (ZOLOFT) 100 MG tablet Take 100 mg by mouth daily. Patient not taking: Reported on 10/13/2021 04/01/21   [provider]    Inpatient Medications:   aspirin EC  81 mg Oral Daily   cholecalciferol  1,000 Units Oral Daily   enoxaparin (LOVENOX) injection  30 mg Subcutaneous Q24H   fenofibrate  160 mg Oral Daily   gabapentin  100 mg Oral TID   hydroxychloroquine  200 mg Oral Daily   insulin aspart  0-5 Units Subcutaneous QHS   insulin aspart  0-9 Units Subcutaneous TID WC   insulin aspart protamine- aspart  10 Units Subcutaneous BID WC   loratadine  10 mg Oral Daily   magnesium oxide  400 mg  Oral BID   multivitamin with minerals  1 tablet Oral QHS   pantoprazole  40 mg Oral BID   propranolol ER  120 mg Oral Daily   vitamin B-12  2,500 mcg Oral Daily     Allergies:  Allergies  Allergen Reactions   Penicillins Hives   Codeine Other (See Comments)    Hallucinate   Levaquin [Levofloxacin In D5w] Other (See Comments)    Unknown   Lipitor [Atorvastatin Calcium] Other (See Comments)    Muscle pain   Lopressor [Metoprolol Tartrate] Other (See Comments)    Heart races   Potassium-Containing Compounds Other (See Comments)    Unknown   Procardia [Nifedipine] Other (See Comments)    Heart races   Sucralfate Nausea Only   Tramadol Other (See Comments)    Confusion   Allegra [Fexofenadine] Rash   Naltrexone Other (See Comments)    Severe headache, nausea, "body flashes".    Sulfa Antibiotics Rash    Social History   Socioeconomic History   Marital status: Widowed    Spouse name: Not on file   Number of children: Not on file   Years of education: Not on file   Highest education level: Not on file  Occupational History   Occupation: retired  Tobacco Use   Smoking status: Former    Types: Cigarettes    Quit date: 12/01/1970    Years since quitting: 50.9   Smokeless tobacco: Never  Vaping Use   Vaping Use: Never used  Substance and Sexual Activity   Alcohol use: Not Currently   Drug use: Never   Sexual activity: Not on file  Other Topics Concern   Not on file  Social History Narrative   Not on file   Social Determinants of Health   Financial Resource Strain: Not on file  Food Insecurity: Not on file  Transportation Needs: Not on file  Physical Activity: Not on file  Stress: Not on file  Social Connections: Not on file  Intimate Partner Violence: Not on file     Family History  Problem Relation Age of Onset   Hypertension Mother    Heart attack Mother    Hypertension Father    Heart attack Father    Skin cancer Father    Heart attack Sister     Breast cancer Neg Hx  Review of Systems Positive for palpitations Negative for: General:  chills, fever, night sweats or weight changes.  Cardiovascular: PND orthopnea syncope dizziness  Dermatological skin lesions rashes Respiratory: Cough congestion Urologic: Frequent urination urination at night and hematuria Abdominal: negative for nausea, vomiting, diarrhea, bright red blood per rectum, melena, or hematemesis Neurologic: negative for visual changes, and/or hearing changes  All other systems reviewed and are otherwise negative except as noted above.  Labs: No results for input(s): CKTOTAL, CKMB, TROPONINI in the last 72 hours. Lab Results  Component Value Date   WBC 5.6 10/13/2021   HGB 12.9 10/13/2021   HCT 39.0 10/13/2021   MCV 88.2 10/13/2021   PLT 224 10/13/2021    Recent Labs  Lab 10/13/21 1056 10/14/21 0431  NA 133* 131*  K 4.5 4.2  CL 96* 100  CO2 22 25  BUN 29* 35*  CREATININE 1.40* 1.74*  CALCIUM 10.0 8.8*  PROT 7.2  --   BILITOT 1.7*  --   ALKPHOS 108  --   ALT 12  --   AST 27  --   GLUCOSE 117* 160*   No results found for: CHOL, HDL, LDLCALC, TRIG Lab Results  Component Value Date   DDIMER 1.36 (H) 10/07/2021    Radiology/Studies:  DG Chest 2 View  Result Date: 10/13/2021 CLINICAL DATA:  Shortness of breath. EXAM: CHEST - 2 VIEW COMPARISON:  05/11/2021 FINDINGS: Patient is rotated to the left on the AP film. Lungs are hyperexpanded. The lungs are clear without focal pneumonia, edema, pneumothorax or pleural effusion. The cardiopericardial silhouette is within normal limits for size. Bones are diffusely demineralized. IMPRESSION: Hyperexpansion without acute cardiopulmonary findings. Electronically Signed   By: Misty Stanley M.D.   On: 10/13/2021 11:28   CT Angio Chest PE W and/or Wo Contrast  Result Date: 10/13/2021 CLINICAL DATA:  Chest pain, shortness of breath EXAM: CT ANGIOGRAPHY CHEST WITH CONTRAST TECHNIQUE: Multidetector CT imaging of  the chest was performed using the standard protocol during bolus administration of intravenous contrast. Multiplanar CT image reconstructions and MIPs were obtained to evaluate the vascular anatomy. RADIATION DOSE REDUCTION: This exam was performed according to the departmental dose-optimization program which includes automated exposure control, adjustment of the mA and/or kV according to patient size and/or use of iterative reconstruction technique. CONTRAST:  82m OMNIPAQUE IOHEXOL 350 MG/ML SOLN COMPARISON:  10/08/2021 FINDINGS: Cardiovascular: There is homogeneous enhancement in thoracic aorta. There is dilation left atrium. Coronary artery calcifications are seen. 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 There are no intraluminal filling defects in the pulmonary artery branches. Mediastinum/Nodes: No new significant lymphadenopathy seen. There is inhomogeneous attenuation in the thyroid with low-density nodules and coarse calcifications. Lungs/Pleura: In image 25 of series 5, there is 4 mm nodule in the left upper lobe. In image 41, there is a 2 mm nodule in the left upper lobe. In image 77there is a 3 mm nodule in the left lower lobe. r in image 82, there is 5 mm nodule in the right middle lobe. In image 19 there is 4 mm pleural-based nodule in the right upper lobe. Small nodular densities in both lungs have not changed in comparison with previous examinations dating as far back as 05/25/2013. There are faint ground-glass densities in the both mid and both lower lung fields, more so in the left lower lobe. There is no pleural effusion or pneumothorax. Upper Abdomen: Surgical clips are seen in gallbladder fossa. There is 2.2 cm nodule with coarse calcifications  in the left adrenal which appears stable. Musculoskeletal: There is cervical fusion. There is marked spinal stenosis at T11-T12 level in the lower thoracic spine with no significant interval change. Review of the MIP images confirms the above  findings. IMPRESSION: There is no evidence of pulmonary artery embolism. There is no evidence of thoracic aortic dissection. Coronary artery disease. Dilated left atrium. Few scattered nodules in both lungs appear stable. There is interval appearance of ground-glass densities in the both lungs, more so in the left lower lobe suggesting possible interstitial pneumonia. Marked spinal stenosis at T11-T12 level with no significant interval change. Other findings as described in the body of the report. Electronically Signed   By: Elmer Picker M.D.   On: 10/13/2021 14:41   CT Angio Chest Pulmonary Embolism (PE) W or WO Contrast  Result Date: 10/08/2021 CLINICAL DATA:  Shortness of breath.  Elevated D-dimer. EXAM: CT ANGIOGRAPHY CHEST WITH CONTRAST TECHNIQUE: Multidetector CT imaging of the chest was performed using the standard protocol during bolus administration of intravenous contrast. Multiplanar CT image reconstructions and MIPs were obtained to evaluate the vascular anatomy. RADIATION DOSE REDUCTION: This exam was performed according to the departmental dose-optimization program which includes automated exposure control, adjustment of the mA and/or kV according to patient size and/or use of iterative reconstruction technique. CONTRAST:  54m OMNIPAQUE IOHEXOL 350 MG/ML SOLN COMPARISON:  Chest CT from 2015 FINDINGS: Cardiovascular: The heart is normal in size. No pericardial effusion. The aorta is normal in caliber. No dissection. Scattered atherosclerotic calcifications mainly at the aortic arch. Advanced three-vessel coronary artery calcifications are noted. The pulmonary arterial tree is well opacified. No filling defects to suggest pulmonary embolism. Mediastinum/Nodes: No mediastinal or hilar mass or lymphadenopathy. The esophagus is grossly normal. Lungs/Pleura: No acute pulmonary findings. No worrisome pulmonary lesions or pulmonary nodules. There is a stable subpleural nodule in the right middle  lobe consistent with a benign lymph node. No pleural effusions or pleural nodules. Upper Abdomen: No significant upper abdominal findings. There is a stable appearing 2.5 cm left adrenal gland lesion containing central calcifications. This was shown to be a benign adenoma on prior abdominal MRI examination from 11/24/2020. Musculoskeletal: No breast masses, supraclavicular or axillary adenopathy. 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 No change since 2014. No worrisome bone lesions. Review of the MIP images confirms the above findings. IMPRESSION: 1. No CT findings for pulmonary embolism. 2. Normal caliber thoracic aorta, no dissection. 3. Advanced three-vessel coronary artery calcifications. 4. No acute pulmonary findings. 5. Stable left adrenal gland adenoma. 6. Aortic atherosclerosis. Aortic Atherosclerosis (ICD10-I70.0). Electronically Signed   By: PMarijo SanesM.D.   On: 10/08/2021 14:57   UKoreaVenous Img Lower Unilateral Left (DVT)  Result Date: 10/08/2021 CLINICAL DATA:  Left lower extremity edema and elevated D-dimer. EXAM: LEFT LOWER EXTREMITY VENOUS DOPPLER ULTRASOUND TECHNIQUE: Gray-scale sonography with graded compression, as well as color Doppler and duplex ultrasound were performed to evaluate the lower extremity deep venous systems from the level of the common femoral vein and including the common femoral, femoral, profunda femoral, popliteal and calf veins including the posterior tibial, peroneal and gastrocnemius veins when visible. The superficial great saphenous vein was also interrogated. Spectral Doppler was utilized to evaluate flow at rest and with distal augmentation maneuvers in the common femoral, femoral and popliteal veins. COMPARISON:  None Available. FINDINGS: Contralateral Common Femoral Vein: Respiratory phasicity is normal and symmetric with the symptomatic side. No evidence of thrombus. Normal  compressibility. Common Femoral  Vein: No evidence of thrombus. Normal compressibility, respiratory phasicity and response to augmentation. Saphenofemoral Junction: No evidence of thrombus. Normal compressibility and flow on color Doppler imaging. Profunda Femoral Vein: No evidence of thrombus. Normal compressibility and flow on color Doppler imaging. Femoral Vein: No evidence of thrombus. Normal compressibility, respiratory phasicity and response to augmentation. Popliteal Vein: No evidence of thrombus. Normal compressibility, respiratory phasicity and response to augmentation. Calf Veins: No evidence of thrombus. Normal compressibility and flow on color Doppler imaging. Superficial Great Saphenous Vein: No evidence of thrombus. Normal compressibility. Venous Reflux:  None. Other Findings: No evidence of superficial thrombophlebitis or abnormal fluid collection. IMPRESSION: No evidence of left lower extremity deep venous thrombosis. Electronically Signed   By: Aletta Edouard M.D.   On: 10/08/2021 13:58   MM 3D SCREEN BREAST BILATERAL  Result Date: 09/17/2021 CLINICAL DATA:  Screening. EXAM: DIGITAL SCREENING BILATERAL MAMMOGRAM WITH TOMOSYNTHESIS AND CAD TECHNIQUE: Bilateral screening digital craniocaudal and mediolateral oblique mammograms were obtained. Bilateral screening digital breast tomosynthesis was performed. The images were evaluated with computer-aided detection. COMPARISON:  Previous exam(s). ACR Breast Density Category b: There are scattered areas of fibroglandular density. FINDINGS: Evaluation, particularly of the left breast, is limited by patient clinical condition/difficulty with positioning. Within the limitations of the study there are no findings suspicious for malignancy. IMPRESSION: No mammographic evidence of malignancy. A result letter of this screening mammogram will be mailed directly to the patient. RECOMMENDATION: Screening mammogram in one year. (Code:SM-B-01Y) BI-RADS CATEGORY  1: Negative. Electronically Signed    By: Audie Pinto M.D.   On: 09/17/2021 08:02    EKG: Normal sinus rhythm otherwise normal EKG  Weights: Filed Weights   10/13/21 1054  Weight: 69.2 kg     Physical Exam: Blood pressure (!) 108/55, pulse 66, temperature 97.6 F (36.4 C), temperature source Oral, resp. rate 17, weight 69.2 kg, SpO2 100 %. Body mass index is 27.01 kg/m. General: Well developed, well nourished, in no acute distress. Head eyes ears nose throat: Normocephalic, atraumatic, sclera non-icteric, no xanthomas, nares are without discharge. No apparent thyromegaly and/or mass  Lungs: Normal respiratory effort.  no wheezes, no rales, no rhonchi.  Heart: RRR with normal S1 S2. no murmur gallop, no rub, PMI is normal size and placement, carotid upstroke normal without bruit, jugular venous pressure is normal Abdomen: Soft, non-tender, non-distended with normoactive bowel sounds. No hepatomegaly. No rebound/guarding. No obvious abdominal masses. Abdominal aorta is normal size without bruit Extremities: Trace edema. no cyanosis, no clubbing, no ulcers  Peripheral : 2+ bilateral upper extremity pulses, 2+ bilateral femoral pulses, 2+ bilateral dorsal pedal pulse Neuro: Alert and oriented. No facial asymmetry. No focal deficit. Moves all extremities spontaneously. Musculoskeletal: Normal muscle tone without kyphosis Psych:  Responds to questions appropriately with a normal affect.    Assessment: 81 year old female with hypertension hyperlipidemia coronary disease status post coronary bypass graft sleep apnea chronic kidney disease with a creatinine of 1.2 having a supraventricular tachycardia of 160 bpm now improved without evidence of congestive heart failure or acute coronary syndrome.  Plan: 1.  Patient may reduce the beta-blocker including propranolol as well as now addition of 120 mg of Cardizem CD due to apparent side effects of metoprolol for maintenance of normal sinus rhythm and risk reduction in  supraventricular tachycardia 2.  No further cardiac diagnostics necessary at this time due to no evidence of acute coronary syndrome or congestive heart failure and full resolution of symptoms 3.  Further evaluation  as outpatient for further adjustments of medication management 4.  High intensity cholesterol therapy 5.  Continue sleep apnea treatment 6.  Okay for discharge home if ambulating well without further significant symptoms  Signed, Corey Skains M.D. [facility] 10/14/2021, 10:38 AM

## 2021-10-17 ENCOUNTER — Encounter (INDEPENDENT_AMBULATORY_CARE_PROVIDER_SITE_OTHER): Payer: Medicare Other

## 2021-10-17 ENCOUNTER — Ambulatory Visit (INDEPENDENT_AMBULATORY_CARE_PROVIDER_SITE_OTHER): Payer: Medicare Other | Admitting: Nurse Practitioner

## 2021-10-20 ENCOUNTER — Other Ambulatory Visit (HOSPITAL_COMMUNITY): Payer: Self-pay | Admitting: Gastroenterology

## 2021-10-20 ENCOUNTER — Other Ambulatory Visit: Payer: Self-pay | Admitting: Gastroenterology

## 2021-10-20 DIAGNOSIS — K7460 Unspecified cirrhosis of liver: Secondary | ICD-10-CM

## 2021-11-28 ENCOUNTER — Other Ambulatory Visit: Payer: Self-pay

## 2021-11-28 DIAGNOSIS — D329 Benign neoplasm of meninges, unspecified: Secondary | ICD-10-CM

## 2021-12-18 ENCOUNTER — Ambulatory Visit
Admission: RE | Admit: 2021-12-18 | Discharge: 2021-12-18 | Disposition: A | Discharge status: Home / Self Care | Payer: Medicare Other | Source: Ambulatory Visit | Attending: Neurosurgery | Admitting: Neurosurgery

## 2021-12-18 DIAGNOSIS — D329 Benign neoplasm of meninges, unspecified: Secondary | ICD-10-CM | POA: Diagnosis not present

## 2021-12-30 ENCOUNTER — Ambulatory Visit (INDEPENDENT_AMBULATORY_CARE_PROVIDER_SITE_OTHER): Payer: Medicare Other | Admitting: Neurosurgery

## 2021-12-30 ENCOUNTER — Encounter: Payer: Self-pay | Admitting: Neurosurgery

## 2021-12-30 VITALS — BP 128/68 | Ht 63.0 in | Wt 152.0 lb

## 2021-12-30 DIAGNOSIS — D329 Benign neoplasm of meninges, unspecified: Secondary | ICD-10-CM

## 2021-12-30 DIAGNOSIS — D420 Neoplasm of uncertain behavior of cerebral meninges: Secondary | ICD-10-CM

## 2021-12-30 NOTE — Progress Notes (Signed)
Referring Physician:  Meade Maw, [address] [place],  [place] [address]  Primary Physician:  Kirk Ruths, MD  History of Present Illness: 12/30/2021 [name] is here today with a known meningioma.  She has no new symptoms.  07/01/2021 [name] is here today with a chief complaint of coincidental finding of a meningioma during workup for loss of taste and smell. Occasionally headaches and dizziness.   She was worked up in the ENT clinic where MRI scan showed a small lesion. She is here today to review this. She has no symptoms currently.  She recently had left knee surgery. She is currently in a nursing facility, but is transitioning back home on Friday.   Review of Systems:  A 10 point review of systems is negative, except for the pertinent positives and negatives detailed in the HPI.  Past Medical History: Past Medical History:  Diagnosis Date   Anginal pain ([place])    Arthritis    B12 deficiency anemia    Cervical disc disease    Cervical disc disease    Chest pain    Cholecystolithiasis    Chronic kidney disease    Cirrhosis of liver ([place]) 11/2019   Colon polyp    Coronary artery disease    Cutaneous sarcoidosis    Diabetes mellitus without complication (HCC)    Pt takes insulin   Diabetic neuropathy (HCC)    GERD (gastroesophageal reflux disease)    Headache    migaines in the past   Hemorrhoid    History of diabetic neuropathy    Hypercalcemia    Hyperlipemia    Hypertension    IBS (irritable bowel syndrome)    Iron deficiency anemia    Myocardial infarction ([place])    Pneumonia    Sleep apnea     Past Surgical History: Past Surgical History:  Procedure Laterality Date   ABDOMINAL HYSTERECTOMY     arthroscopic rotator cuff repair     BACK SURGERY     CARDIAC CATHETERIZATION     CHOLECYSTECTOMY     COLONOSCOPY     COLONOSCOPY WITH PROPOFOL N/A 11/29/2017   Procedure: COLONOSCOPY WITH PROPOFOL;   Surgeon: Lollie Sails, MD;  Location: [facility] ENDOSCOPY;  Service: Endoscopy;  Laterality: N/A;   CORONARY ANGIOPLASTY     PTCA and stent of RCA   CORONARY ARTERY BYPASS GRAFT     ESOPHAGOGASTRODUODENOSCOPY     ESOPHAGOGASTRODUODENOSCOPY (EGD) WITH PROPOFOL N/A 11/29/2017   Procedure: ESOPHAGOGASTRODUODENOSCOPY (EGD) WITH PROPOFOL;  Surgeon: Lollie Sails, MD;  Location: [facility] ENDOSCOPY;  Service: Endoscopy;  Laterality: N/A;   ESOPHAGOGASTRODUODENOSCOPY (EGD) WITH PROPOFOL N/A 03/07/2018   Procedure: ESOPHAGOGASTRODUODENOSCOPY (EGD) WITH PROPOFOL;  Surgeon: Lollie Sails, MD;  Location: [facility] ENDOSCOPY;  Service: Endoscopy;  Laterality: N/A;   ESOPHAGOGASTRODUODENOSCOPY (EGD) WITH PROPOFOL N/A 08/16/2019   Procedure: ESOPHAGOGASTRODUODENOSCOPY (EGD) WITH PROPOFOL;  Surgeon: Tol[place], Benay Pike, MD;  Location: [facility] ENDOSCOPY;  Service: Gastroenterology;  Laterality: N/A;   EXCISION OF ADNEXAL MASS     JOINT REPLACEMENT     left total knee replacement   OOPHORECTOMY     TOTAL KNEE REVISION Left 05/07/2021   Procedure: TOTAL KNEE REVISION;  Surgeon: Dereck Leep, MD;  Location: [facility] ORS;  Service: Orthopedics;  Laterality: Left;   vesicular vaginal fistula      Allergies: Allergies as of 12/30/2021 - Review Complete 12/30/2021  Allergen Reaction Noted   Penicillins Hives 03/04/2018   Codeine Other (See  Comments) 11/26/2017   Levaquin [levofloxacin in d5w] Other (See Comments) 11/26/2017   Lipitor [atorvastatin calcium] Other (See Comments) 11/26/2017   Lopressor [metoprolol tartrate] Other (See Comments) 11/29/2017   Potassium-containing compounds Other (See Comments) 11/26/2017   Procardia [nifedipine] Other (See Comments) 11/26/2017   Sucralfate Nausea Only 11/26/2017   Tramadol Other (See Comments) 11/26/2017   Allegra [fexofenadine] Rash 11/26/2017   Naltrexone Other (See Comments) 12/13/2020   Sulfa antibiotics Rash 11/26/2017    Medications: Current Meds   Medication Sig   acetaminophen (TYLENOL) 500 MG tablet Take 500-1,000 mg by mouth every 4 (four) hours as needed for mild pain or fever.   cholecalciferol (VITAMIN D3) 25 MCG (1000 UNIT) tablet Take 1,000 Units by mouth daily.   Cyanocobalamin (B-12) 2500 MCG TABS Take 2,500 mcg by mouth daily.   diltiazem (CARDIZEM CD) 120 MG 24 hr capsule Take 1 capsule (120 mg total) by mouth daily.   fenofibrate 160 MG tablet Take 160 mg by mouth daily.   furosemide (LASIX) 40 MG tablet Take 40 mg by mouth daily.   gabapentin (NEURONTIN) 100 MG capsule Take 100 mg by mouth 3 (three) times daily.   hydroxychloroquine (PLAQUENIL) 200 MG tablet Take 200 mg by mouth daily.   hydrOXYzine (ATARAX) 25 MG tablet Take 25 mg by mouth daily.   insulin NPH-regular Human (NOVOLIN 70/30) (70-30) 100 UNIT/ML injection Inject 20 Units into the skin 2 (two) times daily with a meal.   loratadine (CLARITIN) 10 MG tablet Take 10 mg by mouth daily.   magnesium oxide (MAG-OX) 400 (240 Mg) MG tablet Take 400 mg by mouth 2 (two) times daily.   meclizine (ANTIVERT) 25 MG tablet Take 25 mg by mouth every 4 (four) hours as needed for dizziness.   Multiple Vitamin (MULTIVITAMIN WITH MINERALS) TABS tablet Take 1 tablet by mouth at bedtime.   nitroGLYCERIN (NITROSTAT) 0.4 MG SL tablet Place 0.4 mg under the tongue every 5 (five) minutes as needed for chest pain.   ondansetron (ZOFRAN) 4 MG tablet Take 4 mg by mouth every 8 (eight) hours as needed for vomiting or nausea.   pantoprazole (PROTONIX) 40 MG tablet Take 40 mg by mouth 2 (two) times daily.   propranolol ER (INDERAL LA) 60 MG 24 hr capsule Take 1 capsule (60 mg total) by mouth daily.   SANTYL 250 UNIT/GM ointment Apply 1 application. topically daily.   spironolactone (ALDACTONE) 50 MG tablet Take 50 mg by mouth daily.    Social History: Social History   Tobacco Use   Smoking status: Former    Types: Cigarettes    Quit date: 12/01/1970    Years since quitting: 51.1    Smokeless tobacco: Never  Vaping Use   Vaping Use: Never used  Substance Use Topics   Alcohol use: Not Currently   Drug use: Never    Family Medical History: Family History  Problem Relation Age of Onset   Hypertension Mother    Heart attack Mother    Hypertension Father    Heart attack Father    Skin cancer Father    Heart attack Sister    Breast cancer Neg Hx     Physical Examination: Vitals:   12/30/21 1142  BP: 128/68    General: Patient is well developed, well nourished, calm, collected, and in no apparent distress. Attention to examination is appropriate.  Neck:   Supple.    Respiratory: Patient is breathing without any difficulty.   NEUROLOGICAL:     Awake,  alert, oriented to person, place, and time.  Speech is clear and fluent. Fund of knowledge is appropriate.   Cranial Nerves: Pupils equal round and reactive to light.  Facial tone is symmetric.  Facial sensation is symmetric. Shoulder shrug is symmetric. Tongue protrusion is midline.  There is no pronator drift.  ROM of spine: full.    Strength: Side Biceps Triceps Deltoid Interossei Grip Wrist Ext. Wrist Flex.  R 5 5 5 5 5 5 5   L 5 5 5 5 5 5 5    Side Iliopsoas Quads Hamstring PF DF EHL  R 5 5 5 5 5 5   L 5 5 5 5 5 5        Bilateral upper and lower extremity sensation is intact to light touch.    No evidence of dysmetria noted.  Gait is abnormal - uses walker  Medical Decision Making  Imaging: MRI Brain 12/18/21 IMPRESSION: Stable noncontrast appearance of a 1 cm meningioma in the right posterior fossa.     Electronically Signed   By: Jorje Guild M.D.   On: 12/18/2021 11:56  I have personally reviewed the images and agree with the above interpretation.  Assessment and Plan: Ms. Diegel is a pleasant 81 y.o. female with meningioma.  It is stable.  We discussed the options including serial imaging or monitoring with reimaging if she develops additional symptoms.  She is opted to come  back for additional imaging if she develops new symptoms.  I will see her back on an as-needed basis.   I spent a total of 10 minutes in face-to-face and non-face-to-face activities related to this patient's care today.  Thank you for involving me in the care of this patient.      Kylina Vultaggio K. Izora Ribas MD, [facility] Neurosurgery

## 2022-01-19 ENCOUNTER — Encounter: Payer: Self-pay | Admitting: Ophthalmology

## 2022-01-21 NOTE — Discharge Instructions (Signed)

## 2022-01-26 ENCOUNTER — Encounter: Payer: Self-pay | Admitting: Ophthalmology

## 2022-01-26 ENCOUNTER — Ambulatory Visit (AMBULATORY_SURGERY_CENTER): Payer: Medicare Other | Admitting: General Practice

## 2022-01-26 ENCOUNTER — Ambulatory Visit: Payer: Medicare Other | Admitting: General Practice

## 2022-01-26 ENCOUNTER — Ambulatory Visit
Admission: RE | Admit: 2022-01-26 | Discharge: 2022-01-26 | Disposition: A | Discharge status: Home / Self Care | Payer: Medicare Other | Attending: Ophthalmology | Admitting: Ophthalmology

## 2022-01-26 ENCOUNTER — Other Ambulatory Visit: Payer: Self-pay

## 2022-01-26 ENCOUNTER — Encounter
Admission: RE | Disposition: A | Discharge status: Home / Self Care | Payer: Self-pay | Source: Home / Self Care | Attending: Ophthalmology

## 2022-01-26 DIAGNOSIS — Z951 Presence of aortocoronary bypass graft: Secondary | ICD-10-CM | POA: Insufficient documentation

## 2022-01-26 DIAGNOSIS — K219 Gastro-esophageal reflux disease without esophagitis: Secondary | ICD-10-CM | POA: Diagnosis not present

## 2022-01-26 DIAGNOSIS — D649 Anemia, unspecified: Secondary | ICD-10-CM | POA: Insufficient documentation

## 2022-01-26 DIAGNOSIS — H2511 Age-related nuclear cataract, right eye: Secondary | ICD-10-CM

## 2022-01-26 DIAGNOSIS — I10 Essential (primary) hypertension: Secondary | ICD-10-CM | POA: Diagnosis not present

## 2022-01-26 DIAGNOSIS — M1990 Unspecified osteoarthritis, unspecified site: Secondary | ICD-10-CM | POA: Diagnosis not present

## 2022-01-26 DIAGNOSIS — E1122 Type 2 diabetes mellitus with diabetic chronic kidney disease: Secondary | ICD-10-CM | POA: Diagnosis not present

## 2022-01-26 DIAGNOSIS — I129 Hypertensive chronic kidney disease with stage 1 through stage 4 chronic kidney disease, or unspecified chronic kidney disease: Secondary | ICD-10-CM | POA: Insufficient documentation

## 2022-01-26 DIAGNOSIS — I252 Old myocardial infarction: Secondary | ICD-10-CM | POA: Insufficient documentation

## 2022-01-26 DIAGNOSIS — Z87891 Personal history of nicotine dependence: Secondary | ICD-10-CM | POA: Diagnosis not present

## 2022-01-26 DIAGNOSIS — Z794 Long term (current) use of insulin: Secondary | ICD-10-CM | POA: Diagnosis not present

## 2022-01-26 DIAGNOSIS — E1136 Type 2 diabetes mellitus with diabetic cataract: Secondary | ICD-10-CM | POA: Insufficient documentation

## 2022-01-26 DIAGNOSIS — I2510 Atherosclerotic heart disease of native coronary artery without angina pectoris: Secondary | ICD-10-CM

## 2022-01-26 DIAGNOSIS — N189 Chronic kidney disease, unspecified: Secondary | ICD-10-CM | POA: Diagnosis not present

## 2022-01-26 HISTORY — PX: CATARACT EXTRACTION W/PHACO: SHX586

## 2022-01-26 LAB — GLUCOSE, CAPILLARY
Glucose-Capillary: 101 mg/dL — ABNORMAL HIGH (ref 70–99)
Glucose-Capillary: 106 mg/dL — ABNORMAL HIGH (ref 70–99)

## 2022-01-26 SURGERY — PHACOEMULSIFICATION, CATARACT, WITH IOL INSERTION
Anesthesia: Monitor Anesthesia Care | Site: Eye | Laterality: Right

## 2022-01-26 MED ORDER — MOXIFLOXACIN HCL 0.5 % OP SOLN
OPHTHALMIC | Status: DC | PRN
  Administered 2022-01-26: 0.2 mL via OPHTHALMIC

## 2022-01-26 MED ORDER — ARMC OPHTHALMIC DILATING DROPS
1.0000 | OPHTHALMIC | Status: DC | PRN
  Administered 2022-01-26 (×3): 1 via OPHTHALMIC

## 2022-01-26 MED ORDER — TETRACAINE HCL 0.5 % OP SOLN
1.0000 [drp] | OPHTHALMIC | Status: DC | PRN
  Administered 2022-01-26 (×3): 1 [drp] via OPHTHALMIC

## 2022-01-26 MED ORDER — ACETAMINOPHEN 500 MG PO TABS: 1000.0000 mg | ORAL_TABLET | Freq: Once | ORAL | Status: DC | PRN

## 2022-01-26 MED ORDER — MIDAZOLAM HCL 2 MG/2ML IJ SOLN
INTRAMUSCULAR | Status: DC | PRN
  Administered 2022-01-26: 1 mg via INTRAVENOUS

## 2022-01-26 MED ORDER — FENTANYL CITRATE (PF) 100 MCG/2ML IJ SOLN
INTRAMUSCULAR | Status: DC | PRN
  Administered 2022-01-26: 100 ug via INTRAVENOUS

## 2022-01-26 MED ORDER — ONDANSETRON HCL 4 MG/2ML IJ SOLN: 4.0000 mg | Freq: Once | INTRAMUSCULAR | Status: DC | PRN

## 2022-01-26 MED ORDER — SIGHTPATH DOSE#1 NA HYALUR & NA CHOND-NA HYALUR IO KIT
PACK | INTRAOCULAR | Status: DC | PRN
  Administered 2022-01-26: 1 via OPHTHALMIC

## 2022-01-26 MED ORDER — SIGHTPATH DOSE#1 BSS IO SOLN
INTRAOCULAR | Status: DC | PRN
  Administered 2022-01-26: 15 mL

## 2022-01-26 MED ORDER — ACETAMINOPHEN 160 MG/5ML PO SOLN: 975.0000 mg | Freq: Once | ORAL | Status: DC | PRN

## 2022-01-26 MED ORDER — SODIUM CHLORIDE 0.9 % IV SOLN: INTRAVENOUS | Status: DC

## 2022-01-26 MED ORDER — SIGHTPATH DOSE#1 BSS IO SOLN
INTRAOCULAR | Status: DC | PRN
  Administered 2022-01-26: 78 mL via OPHTHALMIC

## 2022-01-26 MED ORDER — LIDOCAINE HCL (PF) 2 % IJ SOLN
INTRAOCULAR | Status: DC | PRN
  Administered 2022-01-26: 1 mL via INTRAOCULAR

## 2022-01-26 SURGICAL SUPPLY — 19 items
CANNULA ANT/CHMB 27G (MISCELLANEOUS) IMPLANT
CANNULA ANT/CHMB 27GA (MISCELLANEOUS) IMPLANT
CATARACT SUITE SIGHTPATH (MISCELLANEOUS) ×1 IMPLANT
DISSECTOR HYDRO NUCLEUS 50X22 (MISCELLANEOUS) ×2 IMPLANT
FEE CATARACT SUITE SIGHTPATH (MISCELLANEOUS) ×2 IMPLANT
GLOVE SURG GAMMEX PI TX LF 7.5 (GLOVE) ×2 IMPLANT
GLOVE SURG SYN 8.5  E (GLOVE) ×1
GLOVE SURG SYN 8.5 E (GLOVE) ×1 IMPLANT
GLOVE SURG SYN 8.5 PF PI (GLOVE) ×2 IMPLANT
LENS IOL TECNIS EYHANCE 21.5 (Intraocular Lens) IMPLANT
NDL FILTER BLUNT 18X1 1/2 (NEEDLE) ×2 IMPLANT
NEEDLE FILTER BLUNT 18X1 1/2 (NEEDLE) ×1 IMPLANT
PACK VIT ANT 23G (MISCELLANEOUS) IMPLANT
RING MALYGIN (MISCELLANEOUS) IMPLANT
SUT ETHILON 10-0 CS-B-6CS-B-6 (SUTURE)
SUTURE EHLN 10-0 CS-B-6CS-B-6 (SUTURE) IMPLANT
SYR 3ML LL SCALE MARK (SYRINGE) ×2 IMPLANT
SYR 5ML LL (SYRINGE) ×2 IMPLANT
WATER STERILE IRR 250ML POUR (IV SOLUTION) ×2 IMPLANT

## 2022-01-26 NOTE — Anesthesia Postprocedure Evaluation (Signed)
Anesthesia Post Note  Patient: Rhonda Tyler  Procedure(s) Performed: CATARACT EXTRACTION PHACO AND INTRAOCULAR LENS PLACEMENT (IOC) RIGHT DIABETIC (Right: Eye)     Patient location during evaluation: PACU Anesthesia Type: MAC Level of consciousness: awake and alert Pain management: pain level controlled Vital Signs Assessment: post-procedure vital signs reviewed and stable Respiratory status: spontaneous breathing, nonlabored ventilation, respiratory function stable and patient connected to nasal cannula oxygen Cardiovascular status: stable and blood pressure returned to baseline Postop Assessment: no apparent nausea or vomiting Anesthetic complications: no   No notable events documented.  [name]

## 2022-01-26 NOTE — Transfer of Care (Signed)
Immediate Anesthesia Transfer of Care Note  Patient: Rhonda Tyler  Procedure(s) Performed: CATARACT EXTRACTION PHACO AND INTRAOCULAR LENS PLACEMENT (IOC) RIGHT DIABETIC (Right: Eye)  Patient Location: PACU  Anesthesia Type: MAC  Level of Consciousness: awake, alert  and patient cooperative  Airway and Oxygen Therapy: Patient Spontanous Breathing and Patient connected to supplemental oxygen  Post-op Assessment: Post-op Vital signs reviewed, Patient's Cardiovascular Status Stable, Respiratory Function Stable, Patent Airway and No signs of Nausea or vomiting  Post-op Vital Signs: Reviewed and stable  Complications: No notable events documented.

## 2022-01-26 NOTE — H&P (Signed)
[facility]   Primary Care Physician:  Kirk Ruths, MD Ophthalmologist: Dr. Benay Pillow  Pre-Procedure History & Physical: HPI:  Rhonda Tyler is a 81 y.o. female here for cataract surgery.   Past Medical History:  Diagnosis Date   Anginal pain ([place])    Arthritis    B12 deficiency anemia    Cervical disc disease    Cervical disc disease    Chest pain    Cholecystolithiasis    Chronic kidney disease    Cirrhosis of liver ([place]) 11/2019   Colon polyp    Coronary artery disease    Cutaneous sarcoidosis    Diabetes mellitus without complication (HCC)    Pt takes insulin   Diabetic neuropathy (HCC)    GERD (gastroesophageal reflux disease)    Headache    migaines in the past   Hemorrhoid    History of diabetic neuropathy    Hypercalcemia    Hyperlipemia    Hypertension    IBS (irritable bowel syndrome)    Iron deficiency anemia    Myocardial infarction ([place])    Pneumonia    Sleep apnea    NO CPAP    Past Surgical History:  Procedure Laterality Date   ABDOMINAL HYSTERECTOMY     arthroscopic rotator cuff repair     BACK SURGERY     CARDIAC CATHETERIZATION     CHOLECYSTECTOMY     COLONOSCOPY     COLONOSCOPY WITH PROPOFOL N/A 11/29/2017   Procedure: COLONOSCOPY WITH PROPOFOL;  Surgeon: Lollie Sails, MD;  Location: [facility] ENDOSCOPY;  Service: Endoscopy;  Laterality: N/A;   CORONARY ANGIOPLASTY     PTCA and stent of RCA   CORONARY ARTERY BYPASS GRAFT     ESOPHAGOGASTRODUODENOSCOPY     ESOPHAGOGASTRODUODENOSCOPY (EGD) WITH PROPOFOL N/A 11/29/2017   Procedure: ESOPHAGOGASTRODUODENOSCOPY (EGD) WITH PROPOFOL;  Surgeon: Lollie Sails, MD;  Location: [facility] ENDOSCOPY;  Service: Endoscopy;  Laterality: N/A;   ESOPHAGOGASTRODUODENOSCOPY (EGD) WITH PROPOFOL N/A 03/07/2018   Procedure: ESOPHAGOGASTRODUODENOSCOPY (EGD) WITH PROPOFOL;  Surgeon: Lollie Sails, MD;  Location: [facility] ENDOSCOPY;  Service: Endoscopy;  Laterality: N/A;    ESOPHAGOGASTRODUODENOSCOPY (EGD) WITH PROPOFOL N/A 08/16/2019   Procedure: ESOPHAGOGASTRODUODENOSCOPY (EGD) WITH PROPOFOL;  Surgeon: Toledo, Benay Pike, MD;  Location: [facility] ENDOSCOPY;  Service: Gastroenterology;  Laterality: N/A;   EXCISION OF ADNEXAL MASS     JOINT REPLACEMENT  04/2021   left total knee replacement   OOPHORECTOMY     TOTAL KNEE REVISION Left 05/07/2021   Procedure: TOTAL KNEE REVISION;  Surgeon: Dereck Leep, MD;  Location: [facility] ORS;  Service: Orthopedics;  Laterality: Left;   vesicular vaginal fistula      Prior to Admission medications   Medication Sig Start Date End Date Taking? Authorizing Provider  acetaminophen (TYLENOL) 500 MG tablet Take 500-1,000 mg by mouth every 4 (four) hours as needed for mild pain or fever.   Yes [provider]  cholecalciferol (VITAMIN D3) 25 MCG (1000 UNIT) tablet Take 1,000 Units by mouth daily.   Yes [provider]  Cyanocobalamin (B-12) 2500 MCG TABS Take 2,500 mcg by mouth daily.   Yes [provider]  diltiazem (CARDIZEM CD) 120 MG 24 hr capsule Take 1 capsule (120 mg total) by mouth daily. 10/15/21  Yes Sharen Hones, MD  fenofibrate 160 MG tablet Take 160 mg by mouth daily.   Yes [provider]  Ferrous Sulfate (IRON) 325 (65 Fe) MG TABS Take by mouth daily.   Yes [provider]  furosemide (LASIX) 40 MG tablet Take 40 mg by mouth daily. 02/22/21  Yes [provider]  gabapentin (NEURONTIN) 100 MG capsule Take 100 mg by mouth 3 (three) times daily. 09/25/21  Yes [provider]  HYDROcodone-acetaminophen (NORCO/VICODIN) 5-325 MG tablet Take 1 tablet by mouth every 6 (six) hours as needed for moderate pain.   Yes [provider]  hydroxychloroquine (PLAQUENIL) 200 MG tablet Take 200 mg by mouth daily. 12/29/19  Yes [provider]  hydrOXYzine (ATARAX) 25 MG tablet Take 25 mg by mouth daily.   Yes [provider]  insulin NPH-regular Human  (NOVOLIN 70/30) (70-30) 100 UNIT/ML injection Inject 20 Units into the skin 2 (two) times daily with a meal.   Yes [provider]  insulin regular (NOVOLIN R) 100 units/mL injection Inject 1-10 Units into the skin daily. lunchtime   Yes [provider]  lisinopril (ZESTRIL) 5 MG tablet Take 5 mg by mouth daily.   Yes [provider]  loratadine (CLARITIN) 10 MG tablet Take 10 mg by mouth daily.   Yes [provider]  magnesium oxide (MAG-OX) 400 (240 Mg) MG tablet Take 400 mg by mouth 2 (two) times daily. 03/13/21  Yes [provider]  meclizine (ANTIVERT) 25 MG tablet Take 25 mg by mouth every 4 (four) hours as needed for dizziness. 11/30/19  Yes [provider]  Multiple Vitamin (MULTIVITAMIN WITH MINERALS) TABS tablet Take 1 tablet by mouth at bedtime.   Yes [provider]  nitroGLYCERIN (NITROSTAT) 0.4 MG SL tablet Place 0.4 mg under the tongue every 5 (five) minutes as needed for chest pain.   Yes [provider]  ondansetron (ZOFRAN) 4 MG tablet Take 4 mg by mouth every 8 (eight) hours as needed for vomiting or nausea. 09/19/20  Yes [provider]  pantoprazole (PROTONIX) 40 MG tablet Take 40 mg by mouth 2 (two) times daily.   Yes [provider]  propranolol ER (INDERAL LA) 60 MG 24 hr capsule Take 1 capsule (60 mg total) by mouth daily. 10/14/21 10/14/22 Yes Sharen Hones, MD  SANTYL 250 UNIT/GM ointment Apply 1 application. topically daily. 06/30/21  Yes [provider]  senna (SENOKOT) 8.6 MG tablet Take 1 tablet by mouth daily.   Yes [provider]  sertraline (ZOLOFT) 100 MG tablet Take 100 mg by mouth daily.   Yes [provider]  spironolactone (ALDACTONE) 50 MG tablet Take 50 mg by mouth daily. 03/13/21  Yes [provider]  triamcinolone cream (KENALOG) 0.1 % Apply 1 Application topically 2 (two) times daily as needed.   Yes [provider]    Allergies as  of 12/16/2021 - Review Complete 10/13/2021  Allergen Reaction Noted   Penicillins Hives 03/04/2018   Codeine Other (See Comments) 11/26/2017   Levaquin [levofloxacin in d5w] Other (See Comments) 11/26/2017   Lipitor [atorvastatin calcium] Other (See Comments) 11/26/2017   Lopressor [metoprolol tartrate] Other (See Comments) 11/29/2017   Potassium-containing compounds Other (See Comments) 11/26/2017   Procardia [nifedipine] Other (See Comments) 11/26/2017   Sucralfate Nausea Only 11/26/2017   Tramadol Other (See Comments) 11/26/2017   Allegra [fexofenadine] Rash 11/26/2017   Naltrexone Other (See Comments) 12/13/2020   Sulfa antibiotics Rash 11/26/2017    Family History  Problem Relation Age of Onset   Hypertension Mother    Heart attack Mother    Hypertension Father    Heart attack Father    Skin cancer Father    Heart  attack Sister    Breast cancer Neg Hx     Social History   Socioeconomic History   Marital status: Widowed    Spouse name: Not on file   Number of children: Not on file   Years of education: Not on file   Highest education level: Not on file  Occupational History   Occupation: retired  Tobacco Use   Smoking status: Former    Types: Cigarettes    Quit date: 12/01/1970    Years since quitting: 51.1   Smokeless tobacco: Never  Vaping Use   Vaping Use: Never used  Substance and Sexual Activity   Alcohol use: Not Currently   Drug use: Never   Sexual activity: Not on file  Other Topics Concern   Not on file  Social History Narrative   Not on file   Social Determinants of Health   Financial Resource Strain: Not on file  Food Insecurity: Not on file  Transportation Needs: Not on file  Physical Activity: Not on file  Stress: Not on file  Social Connections: Not on file  Intimate Partner Violence: Not on file    Review of Systems: See HPI, otherwise negative ROS  Physical Exam: BP 125/66   Pulse 71   Temp 97.7 F (36.5 C) (Temporal)   Resp  (!) 23   Ht 5' 3"  (1.6 m)   Wt 69 kg   SpO2 100%   BMI 26.95 kg/m  General:   Alert, cooperative in NAD Head:  Normocephalic and atraumatic. Respiratory:  Normal work of breathing. Cardiovascular:  RRR  Impression/Plan: Rhonda Tyler is here for cataract surgery.  Risks, benefits, limitations, and alternatives regarding cataract surgery have been reviewed with the patient.  Questions have been answered.  All parties agreeable.   Benay Pillow, MD  01/26/2022, 7:13 AM

## 2022-01-26 NOTE — Anesthesia Preprocedure Evaluation (Addendum)
Anesthesia Evaluation  Patient identified by MRN, date of birth, ID band Patient awake    Reviewed: Allergy & Precautions, H&P , NPO status , Patient's Chart, lab work & pertinent test results, reviewed documented beta blocker date and time   Airway Mallampati: II  TM Distance: >3 FB Neck ROM: full    Dental no notable dental hx.    Pulmonary neg pulmonary ROS, sleep apnea , former smoker,    Pulmonary exam normal breath sounds clear to auscultation       Cardiovascular Exercise Tolerance: Good hypertension, + CAD, + Past MI and + CABG (1993)   Rhythm:regular Rate:Normal  2019 TTE - Left ventricle:  Wall thickness was increased in a pattern of mild LVH.  Systolic function was vigorous. The estimated ejection fraction was in the range of 65% to 70%. Aortic valve: There was no stenosis.Mean gradient (S): 7 mm Hg. Peak gradient (S): 13 mm Hg.    Mitral valve: Poorly visualized. Systolic bowing without  prolapse. Doppler: There was mild regurgitation.    Left atrium: The appendage was moderately dilated.   Right ventricle: The cavity size was normal. Systolic function was normal.   Tricuspid valve:  Doppler: There was mild regurgitation.    Neuro/Psych  Headaches, negative neurological ROS  negative psych ROS   GI/Hepatic negative GI ROS, Neg liver ROS, GERD  ,(+) Cirrhosis       , Hepatitis -  Endo/Other  negative endocrine ROSdiabetes, Type 2, Insulin Dependent  Renal/GU Renal diseasenegative Renal ROS  negative genitourinary   Musculoskeletal  (+) Arthritis ,   Abdominal   Peds  Hematology negative hematology ROS (+) Blood dyscrasia, anemia ,   Anesthesia Other Findings   Reproductive/Obstetrics negative OB ROS                            Anesthesia Physical Anesthesia Plan  ASA: 3  Anesthesia Plan: MAC   Post-op Pain Management:    Induction:   PONV Risk Score and  Plan: 2 and TIVA, Treatment may vary due to age or medical condition and Midazolam  Airway Management Planned:   Additional Equipment:   Intra-op Plan:   Post-operative Plan:   Informed Consent: I have reviewed the patients History and Physical, chart, labs and discussed the procedure including the risks, benefits and alternatives for the proposed anesthesia with the patient or authorized representative who has indicated his/her understanding and acceptance.     Dental Advisory Given  Plan Discussed with: CRNA  Anesthesia Plan Comments:         Anesthesia Quick Evaluation

## 2022-01-26 NOTE — Op Note (Signed)
OPERATIVE NOTE  Rhonda Tyler [id number] 01/26/2022   PREOPERATIVE DIAGNOSIS:  Nuclear sclerotic cataract right eye.  H25.11   POSTOPERATIVE DIAGNOSIS:    Nuclear sclerotic cataract right eye.     PROCEDURE:  Phacoemusification with posterior chamber intraocular lens placement of the right eye   LENS:   Implant Name Type Inv. Item Serial No. Manufacturer Lot No. LRB No. Used Action  LENS IOL TECNIS EYHANCE 21.5 - [id number] Intraocular Lens LENS IOL TECNIS EYHANCE 21.5 [id number] [employer]  Right 1 Implanted       Procedure(s) with comments: CATARACT EXTRACTION PHACO AND INTRAOCULAR LENS PLACEMENT (IOC) RIGHT DIABETIC (Right) - 2.90 0:29.3  DIB00 +21.5   ULTRASOUND TIME: 0 minutes 29 seconds.  CDE 2.90   SURGEON:  Benay Pillow, MD, MPH  ANESTHESIOLOGIST: Anesthesiologist: [name], MD CRNA: Louanne Belton, CRNA   ANESTHESIA:  Topical with tetracaine drops augmented with 1% preservative-free intracameral lidocaine.  ESTIMATED BLOOD LOSS: less than 1 mL.   COMPLICATIONS:  None.   DESCRIPTION OF PROCEDURE:  The patient was identified in the holding room and transported to the operating room and placed in the supine position under the operating microscope.  The right eye was identified as the operative eye and it was prepped and draped in the usual sterile ophthalmic fashion.   A 1.0 millimeter clear-corneal paracentesis was made at the 10:30 position. 0.5 ml of preservative-free 1% lidocaine with epinephrine was injected into the anterior chamber.  The anterior chamber was filled with viscoelastic.  A 2.4 millimeter keratome was used to make a near-clear corneal incision at the 8:00 position.  A curvilinear capsulorrhexis was made with a cystotome and capsulorrhexis forceps.  Balanced salt solution was used to hydrodissect and hydrodelineate the nucleus.   Phacoemulsification was then used in stop and chop fashion to remove the lens nucleus and epinucleus.  The  remaining cortex was then removed using the irrigation and aspiration handpiece. Viscoelastic was then placed into the capsular bag to distend it for lens placement.  A lens was then injected into the capsular bag.  The remaining viscoelastic was aspirated.   Wounds were hydrated with balanced salt solution.  The anterior chamber was inflated to a physiologic pressure with balanced salt solution.   Intracameral vigamox 0.1 mL undiluted was injected into the eye and a drop placed onto the ocular surface.  No wound leaks were noted.  The patient was taken to the recovery room in stable condition without complications of anesthesia or surgery  Benay Pillow 01/26/2022, 7:50 AM

## 2022-01-27 ENCOUNTER — Encounter: Payer: Self-pay | Admitting: Ophthalmology

## 2022-02-02 ENCOUNTER — Encounter: Payer: Self-pay | Admitting: Ophthalmology

## 2022-02-04 NOTE — Discharge Instructions (Signed)

## 2022-02-09 ENCOUNTER — Encounter
Admission: RE | Disposition: A | Discharge status: Home / Self Care | Payer: Self-pay | Source: Home / Self Care | Attending: Ophthalmology

## 2022-02-09 ENCOUNTER — Encounter: Payer: Self-pay | Admitting: Ophthalmology

## 2022-02-09 ENCOUNTER — Ambulatory Visit: Payer: Medicare Other | Admitting: Anesthesiology

## 2022-02-09 ENCOUNTER — Ambulatory Visit
Admission: RE | Admit: 2022-02-09 | Discharge: 2022-02-09 | Disposition: A | Discharge status: Home / Self Care | Payer: Medicare Other | Attending: Ophthalmology | Admitting: Ophthalmology

## 2022-02-09 ENCOUNTER — Other Ambulatory Visit: Payer: Self-pay

## 2022-02-09 DIAGNOSIS — K219 Gastro-esophageal reflux disease without esophagitis: Secondary | ICD-10-CM | POA: Insufficient documentation

## 2022-02-09 DIAGNOSIS — N189 Chronic kidney disease, unspecified: Secondary | ICD-10-CM | POA: Diagnosis not present

## 2022-02-09 DIAGNOSIS — I252 Old myocardial infarction: Secondary | ICD-10-CM | POA: Diagnosis not present

## 2022-02-09 DIAGNOSIS — H2512 Age-related nuclear cataract, left eye: Secondary | ICD-10-CM | POA: Diagnosis present

## 2022-02-09 DIAGNOSIS — E1129 Type 2 diabetes mellitus with other diabetic kidney complication: Secondary | ICD-10-CM

## 2022-02-09 DIAGNOSIS — I2510 Atherosclerotic heart disease of native coronary artery without angina pectoris: Secondary | ICD-10-CM | POA: Insufficient documentation

## 2022-02-09 DIAGNOSIS — I129 Hypertensive chronic kidney disease with stage 1 through stage 4 chronic kidney disease, or unspecified chronic kidney disease: Secondary | ICD-10-CM | POA: Diagnosis not present

## 2022-02-09 DIAGNOSIS — E1122 Type 2 diabetes mellitus with diabetic chronic kidney disease: Secondary | ICD-10-CM | POA: Insufficient documentation

## 2022-02-09 HISTORY — PX: CATARACT EXTRACTION W/PHACO: SHX586

## 2022-02-09 LAB — GLUCOSE, CAPILLARY
Glucose-Capillary: 79 mg/dL (ref 70–99)
Glucose-Capillary: 133 mg/dL — ABNORMAL HIGH (ref 70–99)

## 2022-02-09 SURGERY — PHACOEMULSIFICATION, CATARACT, WITH IOL INSERTION
Anesthesia: Monitor Anesthesia Care | Site: Eye | Laterality: Left

## 2022-02-09 MED ORDER — ARMC OPHTHALMIC DILATING DROPS
1.0000 | OPHTHALMIC | Status: DC | PRN
  Administered 2022-02-09 (×3): 1 via OPHTHALMIC

## 2022-02-09 MED ORDER — LACTATED RINGERS IV SOLN: INTRAVENOUS | Status: DC

## 2022-02-09 MED ORDER — SIGHTPATH DOSE#1 BSS IO SOLN
INTRAOCULAR | Status: DC | PRN
  Administered 2022-02-09: 95 mL via OPHTHALMIC

## 2022-02-09 MED ORDER — LIDOCAINE HCL (PF) 2 % IJ SOLN
INTRAOCULAR | Status: DC | PRN
  Administered 2022-02-09: 4 mL via INTRAOCULAR

## 2022-02-09 MED ORDER — MIDAZOLAM HCL 2 MG/2ML IJ SOLN
INTRAMUSCULAR | Status: DC | PRN
  Administered 2022-02-09: 1 mg via INTRAVENOUS

## 2022-02-09 MED ORDER — DEXTROSE 50 % IV SOLN
25.0000 mL | Freq: Once | INTRAVENOUS | Status: AC
  Administered 2022-02-09: 25 mL via INTRAVENOUS

## 2022-02-09 MED ORDER — SIGHTPATH DOSE#1 BSS IO SOLN
INTRAOCULAR | Status: DC | PRN
  Administered 2022-02-09: 15 mL via INTRAOCULAR

## 2022-02-09 MED ORDER — MOXIFLOXACIN HCL 0.5 % OP SOLN
OPHTHALMIC | Status: DC | PRN
  Administered 2022-02-09: 0.2 mL via OPHTHALMIC

## 2022-02-09 MED ORDER — SIGHTPATH DOSE#1 NA HYALUR & NA CHOND-NA HYALUR IO KIT
PACK | INTRAOCULAR | Status: DC | PRN
  Administered 2022-02-09: 1 via OPHTHALMIC

## 2022-02-09 MED ORDER — TETRACAINE HCL 0.5 % OP SOLN
1.0000 [drp] | OPHTHALMIC | Status: DC | PRN
Start: 2022-02-09 — End: 2022-02-09
  Administered 2022-02-09 (×3): 1 [drp] via OPHTHALMIC

## 2022-02-09 SURGICAL SUPPLY — 15 items
CANNULA ANT/CHMB 27G (MISCELLANEOUS) IMPLANT
CANNULA ANT/CHMB 27GA (MISCELLANEOUS) IMPLANT
CATARACT SUITE SIGHTPATH (MISCELLANEOUS) ×1 IMPLANT
DISSECTOR HYDRO NUCLEUS 50X22 (MISCELLANEOUS) ×2 IMPLANT
FEE CATARACT SUITE SIGHTPATH (MISCELLANEOUS) ×2 IMPLANT
GLOVE SURG GAMMEX PI TX LF 7.5 (GLOVE) ×2 IMPLANT
GLOVE SURG SYN 8.5  E (GLOVE) ×1
GLOVE SURG SYN 8.5 E (GLOVE) ×1 IMPLANT
GLOVE SURG SYN 8.5 PF PI (GLOVE) ×2 IMPLANT
LENS IOL TECNIS EYHANCE 21.0 (Intraocular Lens) IMPLANT
NDL FILTER BLUNT 18X1 1/2 (NEEDLE) ×2 IMPLANT
NEEDLE FILTER BLUNT 18X1 1/2 (NEEDLE) ×1 IMPLANT
SYR 3ML LL SCALE MARK (SYRINGE) ×2 IMPLANT
SYR 5ML LL (SYRINGE) ×2 IMPLANT
WATER STERILE IRR 250ML POUR (IV SOLUTION) ×2 IMPLANT

## 2022-02-09 NOTE — Anesthesia Preprocedure Evaluation (Addendum)
Anesthesia Evaluation  Patient identified by MRN, date of birth, ID band Patient awake    Reviewed: Allergy & Precautions, NPO status , Patient's Chart, lab work & pertinent test results  History of Anesthesia Complications Negative for: history of anesthetic complications  Airway Mallampati: III  TM Distance: >3 FB Neck ROM: full    Dental  (+) Chipped   Pulmonary neg shortness of breath, sleep apnea , former smoker,    Pulmonary exam normal        Cardiovascular Exercise Tolerance: Good hypertension, + CAD and + Past MI  Normal cardiovascular exam     Neuro/Psych  Headaches, negative psych ROS   GI/Hepatic GERD  Controlled,(+) Hepatitis -  Endo/Other  negative endocrine ROSdiabetes, Type 2  Renal/GU Renal disease     Musculoskeletal   Abdominal   Peds  Hematology negative hematology ROS (+)   Anesthesia Other Findings Past Medical History: No date: Anginal pain (HCC) No date: Arthritis No date: B12 deficiency anemia No date: Cervical disc disease No date: Cervical disc disease No date: Chest pain No date: Cholecystolithiasis No date: Chronic kidney disease 11/2019: Cirrhosis of liver (HCC) No date: Colon polyp No date: Coronary artery disease 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 No date: Diabetes mellitus without complication (HCC)     Comment:  Pt takes insulin No date: Diabetic neuropathy (HCC) No date: GERD (gastroesophageal reflux disease) No date: Headache     Comment:  migaines in the past No date: Hemorrhoid No date: History of diabetic neuropathy No date: Hypercalcemia No date: Hyperlipemia No date: Hypertension No date: IBS (irritable bowel syndrome) No date: Iron deficiency anemia No date: Myocardial infarction ([place]) No date: Pneumonia No date: Sleep apnea     Comment:  NO CPAP  Past Surgical History: No date: ABDOMINAL HYSTERECTOMY No date: arthroscopic rotator cuff repair No date:  BACK SURGERY No date: CARDIAC CATHETERIZATION 01/26/2022: CATARACT EXTRACTION W/PHACO; Right     Comment:  Procedure: CATARACT EXTRACTION PHACO AND INTRAOCULAR               LENS PLACEMENT (IOC) RIGHT DIABETIC;  Surgeon: Eulogio Bear, MD;  Location: [place];                Service: Ophthalmology;  Laterality: Right;  2.90 0:29.3 No date: CHOLECYSTECTOMY No date: COLONOSCOPY 11/29/2017: COLONOSCOPY WITH PROPOFOL; N/A     Comment:  Procedure: COLONOSCOPY WITH PROPOFOL;  Surgeon:               Lollie Sails, MD;  Location: [facility]COPY;                Service: Endoscopy;  Laterality: N/A; No date: CORONARY ANGIOPLASTY     Comment:  PTCA and stent of RCA No date: CORONARY ARTERY BYPASS GRAFT No date: ESOPHAGOGASTRODUODENOSCOPY 11/29/2017: ESOPHAGOGASTRODUODENOSCOPY (EGD) WITH PROPOFOL; N/A     Comment:  Procedure: ESOPHAGOGASTRODUODENOSCOPY (EGD) WITH               PROPOFOL;  Surgeon: Lollie Sails, MD;  Location:               [facility] ENDOSCOPY;  Service: Endoscopy;  Laterality: N/A; 03/07/2018: ESOPHAGOGASTRODUODENOSCOPY (EGD) WITH PROPOFOL; N/A     Comment:  Procedure: ESOPHAGOGASTRODUODENOSCOPY (EGD) WITH               PROPOFOL;  Surgeon: Lollie Sails, MD;  Location:  [place] ENDOSCOPY;  Service: Endoscopy;  Laterality: N/A; 08/16/2019: ESOPHAGOGASTRODUODENOSCOPY (EGD) WITH PROPOFOL; N/A     Comment:  Procedure: ESOPHAGOGASTRODUODENOSCOPY (EGD) WITH               PROPOFOL;  Surgeon: Toledo, Benay Pike, MD;  Location:               [facility] ENDOSCOPY;  Service: Gastroenterology;  Laterality:               N/A; No date: EXCISION OF ADNEXAL MASS 04/2021: JOINT REPLACEMENT     Comment:  left total knee replacement No date: OOPHORECTOMY 05/07/2021: TOTAL KNEE REVISION; Left     Comment:  Procedure: TOTAL KNEE REVISION;  Surgeon: Dereck Leep, MD;  Location: [facility] ORS;  Service: Orthopedics;                Laterality:  Left; No date: vesicular vaginal fistula  BMI    Body Mass Index: 27.10 kg/m      Reproductive/Obstetrics negative OB ROS                             Anesthesia Physical Anesthesia Plan  ASA: 3  Anesthesia Plan: MAC   Post-op Pain Management:    Induction: Intravenous  PONV Risk Score and Plan:   Airway Management Planned: Natural Airway and Nasal Cannula  Additional Equipment:   Intra-op Plan:   Post-operative Plan:   Informed Consent: I have reviewed the patients History and Physical, chart, labs and discussed the procedure including the risks, benefits and alternatives for the proposed anesthesia with the patient or authorized representative who has indicated his/her understanding and acceptance.     Dental Advisory Given  Plan Discussed with: Anesthesiologist, CRNA and Surgeon  Anesthesia Plan Comments: (Patient consented for risks of anesthesia including but not limited to:  - adverse reactions to medications - damage to eyes, teeth, lips or other oral mucosa - nerve damage due to positioning  - sore throat or hoarseness - Damage to heart, brain, nerves, lungs, other parts of body or loss of life  Patient voiced understanding.)        Anesthesia Quick Evaluation

## 2022-02-09 NOTE — Transfer of Care (Signed)
Immediate Anesthesia Transfer of Care Note  Patient: Rhonda Tyler  Procedure(s) Performed: CATARACT EXTRACTION PHACO AND INTRAOCULAR LENS PLACEMENT (IOC) LEFT DIABETIC 3.54 00:32.3 (Left: Eye)  Patient Location: PACU  Anesthesia Type: MAC  Level of Consciousness: awake, alert  and patient cooperative  Airway and Oxygen Therapy: Patient Spontanous Breathing and Patient connected to supplemental oxygen  Post-op Assessment: Post-op Vital signs reviewed, Patient's Cardiovascular Status Stable, Respiratory Function Stable, Patent Airway and No signs of Nausea or vomiting  Post-op Vital Signs: Reviewed and stable  Complications: No notable events documented.

## 2022-02-09 NOTE — Op Note (Signed)
OPERATIVE NOTE  Rhonda Tyler [id number] 02/09/2022   PREOPERATIVE DIAGNOSIS:  Nuclear sclerotic cataract left eye.  H25.12   POSTOPERATIVE DIAGNOSIS:    Nuclear sclerotic cataract left eye.     PROCEDURE:  Phacoemusification with posterior chamber intraocular lens placement of the left eye   LENS:   Implant Name Type Inv. Item Serial No. Manufacturer Lot No. LRB No. Used Action  LENS IOL TECNIS EYHANCE 21.0 - [id number] Intraocular Lens LENS IOL TECNIS EYHANCE 21.0 [id number] [employer]  Left 1 Implanted      Procedure(s) with comments: CATARACT EXTRACTION PHACO AND INTRAOCULAR LENS PLACEMENT (IOC) LEFT DIABETIC 3.54 00:32.3 (Left) - Diabetic  DIB00 +21.0   ULTRASOUND TIME: 0 minutes 32 seconds.  CDE 3.54   SURGEON:  Benay Pillow, MD, MPH   ANESTHESIA:  Topical with tetracaine drops augmented with 1% preservative-free intracameral lidocaine.  ESTIMATED BLOOD LOSS: <1 mL   COMPLICATIONS:  None.   DESCRIPTION OF PROCEDURE:  The patient was identified in the holding room and transported to the operating room and placed in the supine position under the operating microscope.  The left eye was identified as the operative eye and it was prepped and draped in the usual sterile ophthalmic fashion.   A 1.0 millimeter clear-corneal paracentesis was made at the 5:00 position. 0.5 ml of preservative-free 1% lidocaine with epinephrine was injected into the anterior chamber.  The anterior chamber was filled with viscoelastic.  A 2.4 millimeter keratome was used to make a near-clear corneal incision at the 2:00 position.  A curvilinear capsulorrhexis was made with a cystotome and capsulorrhexis forceps.  Balanced salt solution was used to hydrodissect and hydrodelineate the nucleus.   Phacoemulsification was then used in stop and chop fashion to remove the lens nucleus and epinucleus.  The remaining cortex was then removed using the irrigation and aspiration handpiece. Viscoelastic was then  placed into the capsular bag to distend it for lens placement.  A lens was then injected into the capsular bag.  The remaining viscoelastic was aspirated.   Wounds were hydrated with balanced salt solution.  The anterior chamber was inflated to a physiologic pressure with balanced salt solution.  Intracameral vigamox 0.1 mL undiltued was injected into the eye and a drop placed onto the ocular surface.  No wound leaks were noted.  The patient was taken to the recovery room in stable condition without complications of anesthesia or surgery  Benay Pillow 02/09/2022, 8:24 AM

## 2022-02-09 NOTE — H&P (Signed)
[facility]   Primary Care Physician:  Kirk Ruths, MD Ophthalmologist: Dr. Benay Pillow  Pre-Procedure History & Physical: HPI:  Rhonda Tyler is a 81 y.o. female here for cataract surgery.   Past Medical History:  Diagnosis Date   Anginal pain ([place])    Arthritis    B12 deficiency anemia    Cervical disc disease    Cervical disc disease    Chest pain    Cholecystolithiasis    Chronic kidney disease    Cirrhosis of liver ([place]) 11/2019   Colon polyp    Coronary artery disease    Cutaneous sarcoidosis    Diabetes mellitus without complication (HCC)    Pt takes insulin   Diabetic neuropathy (HCC)    GERD (gastroesophageal reflux disease)    Headache    migaines in the past   Hemorrhoid    History of diabetic neuropathy    Hypercalcemia    Hyperlipemia    Hypertension    IBS (irritable bowel syndrome)    Iron deficiency anemia    Myocardial infarction ([place])    Pneumonia    Sleep apnea    NO CPAP    Past Surgical History:  Procedure Laterality Date   ABDOMINAL HYSTERECTOMY     arthroscopic rotator cuff repair     BACK SURGERY     CARDIAC CATHETERIZATION     CATARACT EXTRACTION W/PHACO Right 01/26/2022   Procedure: CATARACT EXTRACTION PHACO AND INTRAOCULAR LENS PLACEMENT ([place]) RIGHT DIABETIC;  Surgeon: Eulogio Bear, MD;  Location: [place];  Service: Ophthalmology;  Laterality: Right;  2.90 0:29.3   CHOLECYSTECTOMY     COLONOSCOPY     COLONOSCOPY WITH PROPOFOL N/A 11/29/2017   Procedure: COLONOSCOPY WITH PROPOFOL;  Surgeon: Lollie Sails, MD;  Location: [facility] ENDOSCOPY;  Service: Endoscopy;  Laterality: N/A;   CORONARY ANGIOPLASTY     PTCA and stent of RCA   CORONARY ARTERY BYPASS GRAFT     ESOPHAGOGASTRODUODENOSCOPY     ESOPHAGOGASTRODUODENOSCOPY (EGD) WITH PROPOFOL N/A 11/29/2017   Procedure: ESOPHAGOGASTRODUODENOSCOPY (EGD) WITH PROPOFOL;  Surgeon: Lollie Sails, MD;  Location: [facility] ENDOSCOPY;  Service: Endoscopy;   Laterality: N/A;   ESOPHAGOGASTRODUODENOSCOPY (EGD) WITH PROPOFOL N/A 03/07/2018   Procedure: ESOPHAGOGASTRODUODENOSCOPY (EGD) WITH PROPOFOL;  Surgeon: Lollie Sails, MD;  Location: [facility] ENDOSCOPY;  Service: Endoscopy;  Laterality: N/A;   ESOPHAGOGASTRODUODENOSCOPY (EGD) WITH PROPOFOL N/A 08/16/2019   Procedure: ESOPHAGOGASTRODUODENOSCOPY (EGD) WITH PROPOFOL;  Surgeon: Toledo, Benay Pike, MD;  Location: [facility] ENDOSCOPY;  Service: Gastroenterology;  Laterality: N/A;   EXCISION OF ADNEXAL MASS     JOINT REPLACEMENT  04/2021   left total knee replacement   OOPHORECTOMY     TOTAL KNEE REVISION Left 05/07/2021   Procedure: TOTAL KNEE REVISION;  Surgeon: Dereck Leep, MD;  Location: [facility] ORS;  Service: Orthopedics;  Laterality: Left;   vesicular vaginal fistula      Prior to Admission medications   Medication Sig Start Date End Date Taking? Authorizing Provider  acetaminophen (TYLENOL) 500 MG tablet Take 500-1,000 mg by mouth every 4 (four) hours as needed for mild pain or fever.   Yes [provider]  cholecalciferol (VITAMIN D3) 25 MCG (1000 UNIT) tablet Take 1,000 Units by mouth daily.   Yes [provider]  Cyanocobalamin (B-12) 2500 MCG TABS Take 2,500 mcg by mouth daily.   Yes [provider]  diltiazem (CARDIZEM CD) 120 MG 24 hr capsule Take 1 capsule (120 mg total) by mouth daily.  10/15/21  Yes Sharen Hones, MD  fenofibrate 160 MG tablet Take 160 mg by mouth daily.   Yes [provider]  Ferrous Sulfate (IRON) 325 (65 Fe) MG TABS Take by mouth daily.   Yes [provider]  furosemide (LASIX) 40 MG tablet Take 40 mg by mouth daily. 02/22/21  Yes [provider]  gabapentin (NEURONTIN) 100 MG capsule Take 100 mg by mouth 3 (three) times daily. 09/25/21  Yes [provider]  HYDROcodone-acetaminophen (NORCO/VICODIN) 5-325 MG tablet Take 1 tablet by mouth every 6 (six) hours as needed for moderate pain.   Yes [provider]  hydroxychloroquine (PLAQUENIL) 200 MG tablet Take 200 mg by mouth daily. 12/29/19  Yes [provider]  hydrOXYzine (ATARAX) 25 MG tablet Take 25 mg by mouth daily.   Yes [provider]  insulin NPH-regular Human (NOVOLIN 70/30) (70-30) 100 UNIT/ML injection Inject 20 Units into the skin 2 (two) times daily with a meal.   Yes [provider]  insulin regular (NOVOLIN R) 100 units/mL injection Inject 1-10 Units into the skin daily. lunchtime   Yes [provider]  lisinopril (ZESTRIL) 5 MG tablet Take 5 mg by mouth daily.   Yes [provider]  loratadine (CLARITIN) 10 MG tablet Take 10 mg by mouth daily.   Yes [provider]  magnesium oxide (MAG-OX) 400 (240 Mg) MG tablet Take 400 mg by mouth 2 (two) times daily. 03/13/21  Yes [provider]  meclizine (ANTIVERT) 25 MG tablet Take 25 mg by mouth every 4 (four) hours as needed for dizziness. 11/30/19  Yes [provider]  Multiple Vitamin (MULTIVITAMIN WITH MINERALS) TABS tablet Take 1 tablet by mouth at bedtime.   Yes [provider]  nitroGLYCERIN (NITROSTAT) 0.4 MG SL tablet Place 0.4 mg under the tongue every 5 (five) minutes as needed for chest pain.   Yes [provider]  ondansetron (ZOFRAN) 4 MG tablet Take 4 mg by mouth every 8 (eight) hours as needed for vomiting or nausea. 09/19/20  Yes [provider]  pantoprazole (PROTONIX) 40 MG tablet Take 40 mg by mouth 2 (two) times daily.   Yes [provider]  propranolol ER (INDERAL LA) 60 MG 24 hr capsule Take 1 capsule (60 mg total) by mouth daily. 10/14/21 10/14/22 Yes Sharen Hones, MD  SANTYL 250 UNIT/GM ointment Apply 1 application. topically daily. 06/30/21  Yes [provider]  senna (SENOKOT) 8.6 MG tablet Take 1 tablet by mouth daily.   Yes [provider]  sertraline (ZOLOFT) 100 MG tablet Take 100 mg by mouth daily.   Yes [provider]   spironolactone (ALDACTONE) 50 MG tablet Take 50 mg by mouth daily. 03/13/21  Yes [provider]  triamcinolone cream (KENALOG) 0.1 % Apply 1 Application topically 2 (two) times daily as needed.   Yes [provider]    Allergies as of 12/16/2021 - Review Complete 10/13/2021  Allergen Reaction Noted   Penicillins Hives 03/04/2018   Codeine Other (See Comments) 11/26/2017   Levaquin [levofloxacin in d5w] Other (See Comments) 11/26/2017   Lipitor [atorvastatin calcium] Other (See Comments) 11/26/2017   Lopressor [metoprolol tartrate] Other (See Comments) 11/29/2017   Potassium-containing compounds Other (See Comments) 11/26/2017   Procardia [nifedipine] Other (See Comments) 11/26/2017   Sucralfate Nausea Only 11/26/2017   Tramadol Other (See Comments) 11/26/2017   Allegra [fexofenadine] Rash 11/26/2017   Naltrexone Other (See Comments) 12/13/2020   Sulfa antibiotics Rash 11/26/2017  Family History  Problem Relation Age of Onset   Hypertension Mother    Heart attack Mother    Hypertension Father    Heart attack Father    Skin cancer Father    Heart attack Sister    Breast cancer Neg Hx     Social History   Socioeconomic History   Marital status: Widowed    Spouse name: Not on file   Number of children: Not on file   Years of education: Not on file   Highest education level: Not on file  Occupational History   Occupation: retired  Tobacco Use   Smoking status: Former    Types: Cigarettes    Quit date: 12/01/1970    Years since quitting: 51.2   Smokeless tobacco: Never  Vaping Use   Vaping Use: Never used  Substance and Sexual Activity   Alcohol use: Not Currently   Drug use: Never   Sexual activity: Not on file  Other Topics Concern   Not on file  Social History Narrative   Not on file   Social Determinants of Health   Financial Resource Strain: Not on file  Food Insecurity: Not on file  Transportation Needs: Not on file  Physical  Activity: Not on file  Stress: Not on file  Social Connections: Not on file  Intimate Partner Violence: Not on file    Review of Systems: See HPI, otherwise negative ROS  Physical Exam: BP (!) 130/53   Pulse 70   Temp (!) 97.5 F (36.4 C) (Temporal)   Resp 18   Ht 5' 3"  (1.6 m)   Wt 69.4 kg   SpO2 100%   BMI 27.10 kg/m  General:   Alert, cooperative in NAD Head:  Normocephalic and atraumatic. Respiratory:  Normal work of breathing. Cardiovascular:  RRR  Impression/Plan: Rhonda Tyler is here for cataract surgery.  Risks, benefits, limitations, and alternatives regarding cataract surgery have been reviewed with the patient.  Questions have been answered.  All parties agreeable.   Benay Pillow, MD  02/09/2022, 7:54 AM

## 2022-02-09 NOTE — Anesthesia Postprocedure Evaluation (Signed)
Anesthesia Post Note  Patient: Rhonda Tyler  Procedure(s) Performed: CATARACT EXTRACTION PHACO AND INTRAOCULAR LENS PLACEMENT (IOC) LEFT DIABETIC 3.54 00:32.3 (Left: Eye)  Patient location: Phase II. Anesthesia Type: MAC Level of consciousness: awake and alert Pain management: pain level controlled Vital Signs Assessment: post-procedure vital signs reviewed and stable Respiratory status: spontaneous breathing, nonlabored ventilation, respiratory function stable and patient connected to nasal cannula oxygen Cardiovascular status: stable and blood pressure returned to baseline Postop Assessment: no apparent nausea or vomiting Anesthetic complications: no   No notable events documented.   Last Vitals:  Vitals:   02/09/22 0706 02/09/22 0825  BP: (!) 130/53 (!) 129/47  Pulse: 70 80  Resp: 18 (!) 24  Temp: (!) 36.4 C (!) 36.4 C  SpO2: 100% 100%    Last Pain:  Vitals:   02/09/22 0825  TempSrc:   PainSc: 0-No pain                 Precious Haws Ramzi Brathwaite

## 2022-02-10 ENCOUNTER — Other Ambulatory Visit: Payer: Medicare Other

## 2022-02-10 ENCOUNTER — Encounter: Payer: Self-pay | Admitting: Ophthalmology

## 2022-02-12 ENCOUNTER — Ambulatory Visit: Payer: Medicare Other

## 2022-02-12 ENCOUNTER — Ambulatory Visit: Payer: Medicare Other | Admitting: Oncology

## 2022-02-23 ENCOUNTER — Inpatient Hospital Stay: Payer: Medicare Other | Attending: Oncology

## 2022-02-23 DIAGNOSIS — D509 Iron deficiency anemia, unspecified: Secondary | ICD-10-CM | POA: Diagnosis not present

## 2022-02-23 DIAGNOSIS — Z79899 Other long term (current) drug therapy: Secondary | ICD-10-CM | POA: Diagnosis not present

## 2022-02-23 DIAGNOSIS — D500 Iron deficiency anemia secondary to blood loss (chronic): Secondary | ICD-10-CM

## 2022-02-23 LAB — CBC WITH DIFFERENTIAL/PLATELET
Abs Immature Granulocytes: 0.02 10*3/uL (ref 0.00–0.07)
Basophils Absolute: 0 10*3/uL (ref 0.0–0.1)
Basophils Relative: 1 %
Eosinophils Absolute: 0.4 10*3/uL (ref 0.0–0.5)
Eosinophils Relative: 9 %
HCT: 32.5 % — ABNORMAL LOW (ref 36.0–46.0)
Hemoglobin: 10.6 g/dL — ABNORMAL LOW (ref 12.0–15.0)
Immature Granulocytes: 1 %
Lymphocytes Relative: 20 %
Lymphs Abs: 0.9 10*3/uL (ref 0.7–4.0)
MCH: 29.8 pg (ref 26.0–34.0)
MCHC: 32.6 g/dL (ref 30.0–36.0)
MCV: 91.3 fL (ref 80.0–100.0)
Monocytes Absolute: 0.3 10*3/uL (ref 0.1–1.0)
Monocytes Relative: 7 %
Neutro Abs: 2.8 10*3/uL (ref 1.7–7.7)
Neutrophils Relative %: 62 %
Platelets: 201 10*3/uL (ref 150–400)
RBC: 3.56 MIL/uL — ABNORMAL LOW (ref 3.87–5.11)
RDW: 13.5 % (ref 11.5–15.5)
WBC: 4.4 10*3/uL (ref 4.0–10.5)
nRBC: 0 % (ref 0.0–0.2)

## 2022-02-23 LAB — FERRITIN: Ferritin: 45 ng/mL (ref 11–307)

## 2022-02-23 LAB — IRON AND TIBC
Iron: 59 ug/dL (ref 28–170)
Saturation Ratios: 12 % (ref 10.4–31.8)
TIBC: 479 ug/dL — ABNORMAL HIGH (ref 250–450)
UIBC: 420 ug/dL

## 2022-02-24 ENCOUNTER — Inpatient Hospital Stay: Payer: Medicare Other

## 2022-02-24 ENCOUNTER — Encounter: Payer: Self-pay | Admitting: Oncology

## 2022-02-24 ENCOUNTER — Inpatient Hospital Stay (HOSPITAL_BASED_OUTPATIENT_CLINIC_OR_DEPARTMENT_OTHER): Payer: Medicare Other | Admitting: Oncology

## 2022-02-24 VITALS — BP 130/51 | HR 68 | Resp 18 | Ht 63.0 in | Wt 163.0 lb

## 2022-02-24 DIAGNOSIS — K7460 Unspecified cirrhosis of liver: Secondary | ICD-10-CM

## 2022-02-24 DIAGNOSIS — K7581 Nonalcoholic steatohepatitis (NASH): Secondary | ICD-10-CM | POA: Diagnosis not present

## 2022-02-24 DIAGNOSIS — D509 Iron deficiency anemia, unspecified: Secondary | ICD-10-CM

## 2022-02-24 MED ORDER — SODIUM CHLORIDE 0.9 % IV SOLN
INTRAVENOUS | Status: DC | PRN
  Filled 2022-02-24: qty 250

## 2022-02-24 MED ORDER — SODIUM CHLORIDE 0.9 % IV SOLN
200.0000 mg | Freq: Once | INTRAVENOUS | Status: AC
  Administered 2022-02-24: 200 mg via INTRAVENOUS
  Filled 2022-02-24: qty 200

## 2022-02-24 NOTE — Progress Notes (Signed)
Hematology/Oncology Progress note [phone] [phone]      Patient Care Team: Kirk Ruths, MD as PCP - General (Internal Medicine) Earlie Server, MD as Consulting Physician (Hematology and Oncology)  ASSESSMENT & PLAN:   Liver cirrhosis secondary to NASH (nonalcoholic steatohepatitis) ([place]) Cryptogenic liver cirrhosis continue follow-up with gastroenterology for varices surveillance and [place] surveillance.  Iron deficiency anemia #Iron deficiency anemia, Labs reviewed and discussed with Recommend patient to proceed with IV Venofer  X 2.   Orders Placed This Encounter  Procedures   CBC with Differential/Platelet    Standing Status:   Future    Standing Expiration Date:   02/25/2023   Comprehensive metabolic panel    Standing Status:   Future    Standing Expiration Date:   02/24/2023   Iron and TIBC    Standing Status:   Future    Standing Expiration Date:   02/25/2023   Ferritin    Standing Status:   Future    Standing Expiration Date:   02/25/2023   Follow up in 6 months.  All questions were answered. The patient knows to call the clinic with any problems, questions or concerns.  Earlie Server, MD, PhD [facility] Hematology Oncology 02/24/2022   CHIEF COMPLAINTS/REASON FOR VISIT:  iron deficiency anemia  HISTORY OF PRESENTING ILLNESS:  Rhonda Tyler is a  81 y.o.  female with PMH listed below who was referred to me for evaluation of iron deficiency anemia Reviewed patient's recent labs that was done at [facility] office. 05/02/2018 Labs revealed anemia with hemoglobin of 9.3, MCV 74.7, wbc 6.1, platelet 343,000 Iron panel showed TIBC 602, iron saturation 9, ferritin 14.   Reviewed patient's previous labs ordered by primary care physician's office, anemia is chronic onset , duration is since April 2019 No aggravating or improving factors.  Associated signs and symptoms: Patient reports fatigue. Mild  SOB with exertion.  Denies weight loss, easy  bruising, hematochezia, hemoptysis, hematuria. Context:  History of iron deficiency:  Rectal bleeding: deneis Hematemesis or hemoptysis : denies Blood in urine : denies  Last endoscopy: 03/08/2019 upper endoscopy showed many gastric polyps. 11/29/2017 The entire examined colon is normal. - No specimens collected  11/29/2017 Upper endosocpy, Multiple gastric polyps. The largest with stigmata of bleeding resected and retrieved. Clip (MR conditional) was placed. Pathology showed hyperplastic gastric polyps.  Fatigue: Yes.   She is on Aspirin 60m daily,   Rhonda HISTORY NLIZZETT NOBILEis a 81y.o. female who has above history reviewed by me today presents for follow up visit for management of iron deficiency anemia Received IV Venofer treatments previously.  Tolerated well. + fatigue.  Recent cataract surgery.     Review of Systems  Constitutional:  Positive for fatigue. Negative for appetite change, chills and fever.  HENT:   Negative for hearing loss and voice change.   Eyes:  Negative for eye problems.  Respiratory:  Negative for chest tightness and cough.   Cardiovascular:  Negative for chest pain.  Gastrointestinal:  Negative for abdominal distention, abdominal pain, blood in stool and nausea.  Endocrine: Negative for hot flashes.  Genitourinary:  Negative for difficulty urinating and frequency.   Musculoskeletal:  Positive for arthralgias.  Skin:  Negative for itching and rash.  Neurological:  Negative for extremity weakness.  Hematological:  Negative for adenopathy.  Psychiatric/Behavioral:  Negative for confusion.     MEDICAL HISTORY:  Past Medical History:  Diagnosis Date   Anginal pain ([place]  Arthritis    B12 deficiency anemia    Cervical disc disease    Cervical disc disease    Chest pain    Cholecystolithiasis    Chronic kidney disease    Cirrhosis of liver ([place]) 11/2019   Colon polyp    Coronary artery disease    Cutaneous sarcoidosis    Diabetes mellitus  without complication (HCC)    Pt takes insulin   Diabetic neuropathy (HCC)    GERD (gastroesophageal reflux disease)    Headache    migaines in the past   Hemorrhoid    History of diabetic neuropathy    Hypercalcemia    Hyperlipemia    Hypertension    IBS (irritable bowel syndrome)    Iron deficiency anemia    Myocardial infarction ([place])    Pneumonia    Sleep apnea    NO CPAP    SURGICAL HISTORY: Past Surgical History:  Procedure Laterality Date   ABDOMINAL HYSTERECTOMY     arthroscopic rotator cuff repair     BACK SURGERY     CARDIAC CATHETERIZATION     CATARACT EXTRACTION W/PHACO Right 01/26/2022   Procedure: CATARACT EXTRACTION PHACO AND INTRAOCULAR LENS PLACEMENT ([place]) RIGHT DIABETIC;  Surgeon: Eulogio Bear, MD;  Location: [place];  Service: Ophthalmology;  Laterality: Right;  2.90 0:29.3   CATARACT EXTRACTION W/PHACO Left 02/09/2022   Procedure: CATARACT EXTRACTION PHACO AND INTRAOCULAR LENS PLACEMENT (I[facility]) LEFT DIABETIC 3.54 00:32.3;  Surgeon: Eulogio Bear, MD;  Location: [place];  Service: Ophthalmology;  Laterality: Left;  Diabetic   CHOLECYSTECTOMY     COLONOSCOPY     COLONOSCOPY WITH PROPOFOL N/A 11/29/2017   Procedure: COLONOSCOPY WITH PROPOFOL;  Surgeon: Lollie Sails, MD;  Location: [facility] ENDOSCOPY;  Service: Endoscopy;  Laterality: N/A;   CORONARY ANGIOPLASTY     PTCA and stent of RCA   CORONARY ARTERY BYPASS GRAFT     ESOPHAGOGASTRODUODENOSCOPY     ESOPHAGOGASTRODUODENOSCOPY (EGD) WITH PROPOFOL N/A 11/29/2017   Procedure: ESOPHAGOGASTRODUODENOSCOPY (EGD) WITH PROPOFOL;  Surgeon: Lollie Sails, MD;  Location: [facility] ENDOSCOPY;  Service: Endoscopy;  Laterality: N/A;   ESOPHAGOGASTRODUODENOSCOPY (EGD) WITH PROPOFOL N/A 03/07/2018   Procedure: ESOPHAGOGASTRODUODENOSCOPY (EGD) WITH PROPOFOL;  Surgeon: Lollie Sails, MD;  Location: [facility] ENDOSCOPY;  Service: Endoscopy;  Laterality: N/A;   ESOPHAGOGASTRODUODENOSCOPY (EGD)  WITH PROPOFOL N/A 08/16/2019   Procedure: ESOPHAGOGASTRODUODENOSCOPY (EGD) WITH PROPOFOL;  Surgeon: Tol[place], Benay Pike, MD;  Location: [facility] ENDOSCOPY;  Service: Gastroenterology;  Laterality: N/A;   EXCISION OF ADNEXAL MASS     JOINT REPLACEMENT  04/2021   left total knee replacement   OOPHORECTOMY     TOTAL KNEE REVISION Left 05/07/2021   Procedure: TOTAL KNEE REVISION;  Surgeon: Dereck Leep, MD;  Location: [facility] ORS;  Service: Orthopedics;  Laterality: Left;   vesicular vaginal fistula      SOCIAL HISTORY: Social History   Socioeconomic History   Marital status: Widowed    Spouse name: Not on file   Number of children: Not on file   Years of education: Not on file   Highest education level: Not on file  Occupational History   Occupation: retired  Tobacco Use   Smoking status: Former    Types: Cigarettes    Quit date: 12/01/1970    Years since quitting: 51.2   Smokeless tobacco: Never  Vaping Use   Vaping Use: Never used  Substance and Sexual Activity   Alcohol use: Not Currently   Drug use: Never  Sexual activity: Not on file  Other Topics Concern   Not on file  Social History Narrative   Not on file   Social Determinants of Health   Financial Resource Strain: Not on file  Food Insecurity: Not on file  Transportation Needs: Not on file  Physical Activity: Not on file  Stress: Not on file  Social Connections: Not on file  Intimate Partner Violence: Not on file    FAMILY HISTORY: Family History  Problem Relation Age of Onset   Hypertension Mother    Heart attack Mother    Hypertension Father    Heart attack Father    Skin cancer Father    Heart attack Sister    Breast cancer Neg Hx     ALLERGIES:  is allergic to levaquin [levofloxacin in d5w], penicillins, codeine, lipitor [atorvastatin calcium], lopressor [metoprolol tartrate], potassium-containing compounds, procardia [nifedipine], sucralfate, tramadol, allegra [fexofenadine], naltrexone, and sulfa  antibiotics.  MEDICATIONS:  Current Outpatient Medications  Medication Sig Dispense Refill   acetaminophen (TYLENOL) 500 MG tablet Take 500-1,000 mg by mouth every 4 (four) hours as needed for mild pain or fever.     cholecalciferol (VITAMIN D3) 25 MCG (1000 UNIT) tablet Take 1,000 Units by mouth daily.     Cyanocobalamin (B-12) 2500 MCG TABS Take 2,500 mcg by mouth daily.     diltiazem (CARDIZEM CD) 120 MG 24 hr capsule Take 1 capsule (120 mg total) by mouth daily. 30 capsule 0   Ferrous Sulfate (IRON) 325 (65 Fe) MG TABS Take by mouth daily.     furosemide (LASIX) 40 MG tablet Take 40 mg by mouth daily.     gabapentin (NEURONTIN) 100 MG capsule Take 100 mg by mouth 3 (three) times daily.     gabapentin (NEURONTIN) 100 MG capsule Take 1 capsule by mouth 3 (three) times daily.     HYDROcodone-acetaminophen (NORCO/VICODIN) 5-325 MG tablet Take 1 tablet by mouth every 6 (six) hours as needed for moderate pain.     hydroxychloroquine (PLAQUENIL) 200 MG tablet Take 200 mg by mouth daily.     hydrOXYzine (ATARAX) 25 MG tablet Take 25 mg by mouth daily.     insulin NPH-regular Human (NOVOLIN 70/30) (70-30) 100 UNIT/ML injection Inject 20 Units into the skin 2 (two) times daily with a meal.     insulin regular (NOVOLIN R) 100 units/mL injection Inject 1-10 Units into the skin daily. lunchtime     lisinopril (ZESTRIL) 5 MG tablet Take 5 mg by mouth daily.     loratadine (CLARITIN) 10 MG tablet Take 10 mg by mouth daily.     magnesium oxide (MAG-OX) 400 (240 Mg) MG tablet Take 400 mg by mouth 2 (two) times daily.     Multiple Vitamin (MULTIVITAMIN WITH MINERALS) TABS tablet Take 1 tablet by mouth at bedtime.     pantoprazole (PROTONIX) 40 MG tablet Take 40 mg by mouth 2 (two) times daily.     propranolol ER (INDERAL LA) 60 MG 24 hr capsule Take 1 capsule (60 mg total) by mouth daily. 30 capsule 0   SANTYL 250 UNIT/GM ointment Apply 1 application. topically daily.     senna (SENOKOT) 8.6 MG tablet  Take 1 tablet by mouth daily.     sertraline (ZOLOFT) 100 MG tablet Take 100 mg by mouth daily.     spironolactone (ALDACTONE) 50 MG tablet Take 50 mg by mouth daily.     triamcinolone cream (KENALOG) 0.1 % Apply 1 Application topically 2 (two) times daily  as needed.     fenofibrate 160 MG tablet Take 160 mg by mouth daily. (Patient not taking: Reported on 02/24/2022)     meclizine (ANTIVERT) 25 MG tablet Take 25 mg by mouth every 4 (four) hours as needed for dizziness. (Patient not taking: Reported on 02/24/2022)     nitroGLYCERIN (NITROSTAT) 0.4 MG SL tablet Place 0.4 mg under the tongue every 5 (five) minutes as needed for chest pain. (Patient not taking: Reported on 02/24/2022)     ondansetron (ZOFRAN) 4 MG tablet Take 4 mg by mouth every 8 (eight) hours as needed for vomiting or nausea. (Patient not taking: Reported on 02/24/2022)     No current facility-administered medications for this visit.   Facility-Administered Medications Ordered in Other Visits  Medication Dose Route Frequency Provider Last Rate Last Admin   0.9 %  sodium chloride infusion   Intravenous PRN Earlie Server, MD   Stopped at 02/24/22 1504     PHYSICAL EXAMINATION: ECOG PERFORMANCE STATUS: 1 - Symptomatic but completely ambulatory Vitals:   02/24/22 1340 02/24/22 1342  BP:  (!) 130/51  Pulse: 68   Resp: 18   SpO2: 100%    Filed Weights   02/24/22 1340  Weight: 163 lb (73.9 kg)    Physical Exam Constitutional:      General: She is not in acute distress. HENT:     Head: Normocephalic and atraumatic.  Eyes:     General: No scleral icterus.    Pupils: Pupils are equal, round, and reactive to light.  Cardiovascular:     Rate and Rhythm: Normal rate and regular rhythm.     Heart sounds: Normal heart sounds.  Pulmonary:     Effort: Pulmonary effort is normal. No respiratory distress.     Breath sounds: No wheezing.  Abdominal:     General: Bowel sounds are normal. There is no distension.     Palpations:  Abdomen is soft. There is no mass.     Tenderness: There is no abdominal tenderness.  Musculoskeletal:        General: No deformity. Normal range of motion.     Cervical back: Normal range of motion and neck supple.  Skin:    General: Skin is warm and dry.     Coloration: Skin is not pale.     Findings: No erythema or rash.  Neurological:     Mental Status: She is alert and oriented to person, place, and time.     Cranial Nerves: No cranial nerve deficit.     Coordination: Coordination normal.  Psychiatric:        Behavior: Behavior normal.        Thought Content: Thought content normal.           LABORATORY DATA:  I have reviewed the data as listed    Latest Ref Rng & Units 02/23/2022   10:15 AM 10/13/2021   10:56 AM 08/12/2021    1:17 PM  CBC  WBC 4.0 - 10.5 K/uL 4.4  5.6  7.0   Hemoglobin 12.0 - 15.0 g/dL 10.6  12.9  10.0   Hematocrit 36.0 - 46.0 % 32.5  39.0  32.0   Platelets 150 - 400 K/uL 201  224  256       Latest Ref Rng & Units 10/14/2021    4:31 AM 10/13/2021   10:56 AM 08/12/2021    1:17 PM  CMP  Glucose 70 - 99 mg/dL 160  117  167   BUN  8 - 23 mg/dL 35  29  27   Creatinine 0.44 - 1.00 mg/dL 1.74  1.40  1.46   Sodium 135 - 145 mmol/L 131  133  132   Potassium 3.5 - 5.1 mmol/L 4.2  4.5  4.0   Chloride 98 - 111 mmol/L 100  96  99   CO2 22 - 32 mmol/L 25  22  27    Calcium 8.9 - 10.3 mg/dL 8.8  10.0  9.0   Total Protein 6.5 - 8.1 g/dL  7.2  6.3   Total Bilirubin 0.3 - 1.2 mg/dL  1.7  0.8   Alkaline Phos 38 - 126 U/L  108  81   AST 15 - 41 U/L  27  19   ALT 0 - 44 U/L  12  12        Component Value Date/Time   IRON 59 02/23/2022 1015   TIBC 479 (H) 02/23/2022 1015   FERRITIN 45 02/23/2022 1015   IRONPCTSAT 12 02/23/2022 1015     No results found.

## 2022-02-24 NOTE — Assessment & Plan Note (Signed)
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

## 2022-02-24 NOTE — Assessment & Plan Note (Signed)
#  Iron deficiency anemia, Labs reviewed and discussed with Recommend patient to proceed with IV Venofer  X 2.

## 2022-03-09 ENCOUNTER — Encounter (INDEPENDENT_AMBULATORY_CARE_PROVIDER_SITE_OTHER): Payer: Self-pay

## 2022-03-11 ENCOUNTER — Ambulatory Visit: Payer: Medicare Other

## 2022-03-11 MED FILL — Iron Sucrose Inj 20 MG/ML (Fe Equiv): INTRAVENOUS | Qty: 10 | Status: AC

## 2022-03-12 ENCOUNTER — Inpatient Hospital Stay: Payer: Medicare Other | Attending: Oncology

## 2022-03-12 DIAGNOSIS — D509 Iron deficiency anemia, unspecified: Secondary | ICD-10-CM | POA: Insufficient documentation

## 2022-03-17 ENCOUNTER — Ambulatory Visit: Payer: Medicare Other

## 2022-03-23 ENCOUNTER — Inpatient Hospital Stay: Payer: Medicare Other

## 2022-03-23 VITALS — BP 117/41 | HR 58 | Temp 96.9°F | Resp 16

## 2022-03-23 DIAGNOSIS — D509 Iron deficiency anemia, unspecified: Secondary | ICD-10-CM

## 2022-03-23 MED ORDER — SODIUM CHLORIDE 0.9 % IV SOLN
200.0000 mg | Freq: Once | INTRAVENOUS | Status: AC
  Administered 2022-03-23: 200 mg via INTRAVENOUS
  Filled 2022-03-23: qty 200

## 2022-03-23 MED ORDER — SODIUM CHLORIDE 0.9 % IV SOLN
INTRAVENOUS | Status: DC
  Filled 2022-03-23: qty 250

## 2022-04-07 ENCOUNTER — Other Ambulatory Visit: Payer: Self-pay | Admitting: Gastroenterology

## 2022-04-07 DIAGNOSIS — K7460 Unspecified cirrhosis of liver: Secondary | ICD-10-CM

## 2022-04-16 ENCOUNTER — Ambulatory Visit: Payer: Medicare Other

## 2022-04-23 ENCOUNTER — Ambulatory Visit
Admission: RE | Admit: 2022-04-23 | Discharge: 2022-04-23 | Disposition: A | Discharge status: Home / Self Care | Payer: Medicare Other | Source: Ambulatory Visit | Attending: Gastroenterology | Admitting: Gastroenterology

## 2022-04-23 DIAGNOSIS — K7460 Unspecified cirrhosis of liver: Secondary | ICD-10-CM | POA: Diagnosis present

## 2022-04-23 DIAGNOSIS — K7581 Nonalcoholic steatohepatitis (NASH): Secondary | ICD-10-CM | POA: Insufficient documentation

## 2022-06-15 IMAGING — US US PARACENTESIS
1 series · 5 of 5 positions shown · non-contrast
Comparison: none

INDICATION: found to have ascites on CT abdomen pelvis. Patient presents for
therapeutic and diagnostic paracentesis.

[Series 1: us paracentesis · 0.36mm/px · 5 of 5 slices shown]
[im 1/5]
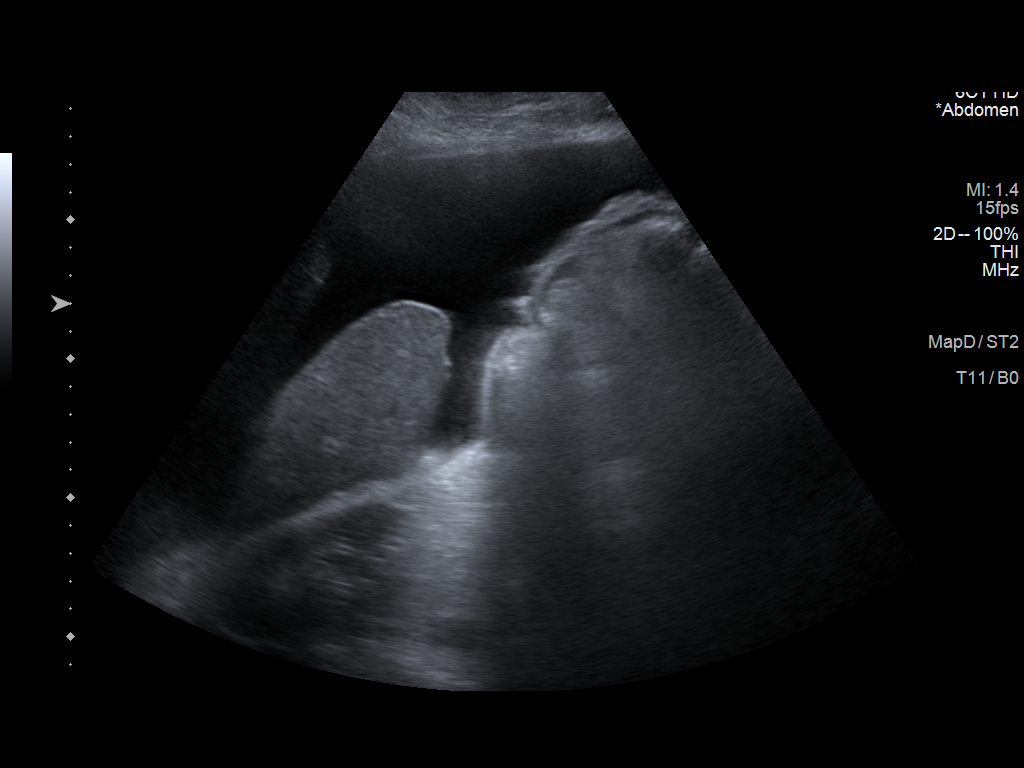
[im 2/5]
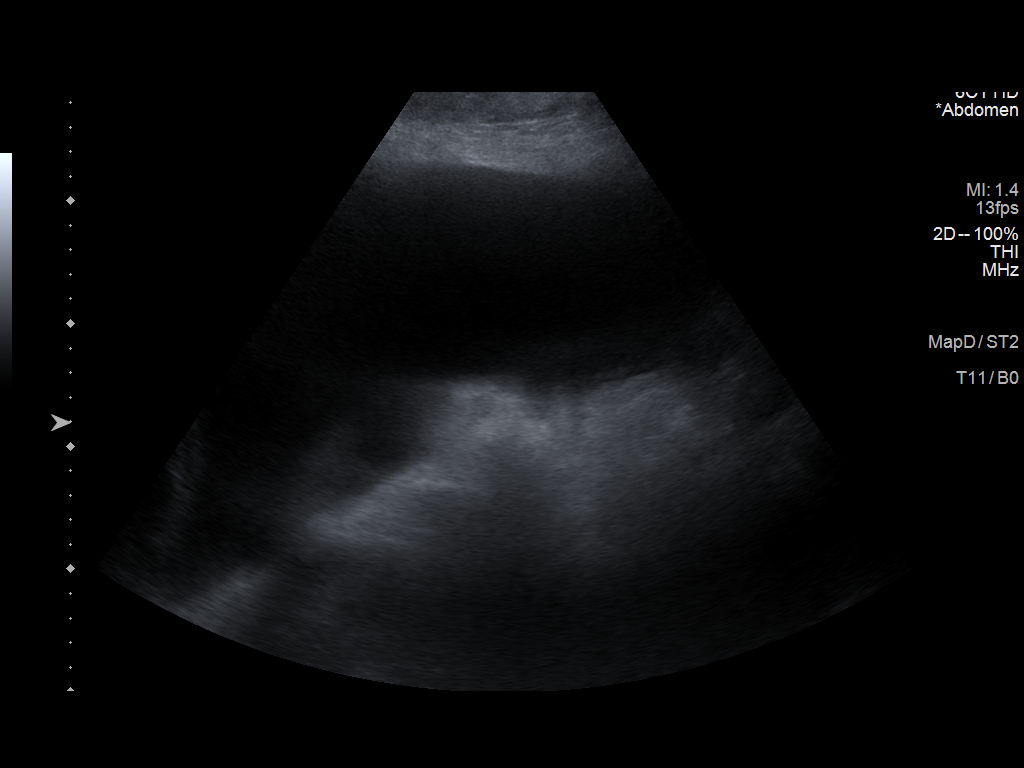
[im 3/5]
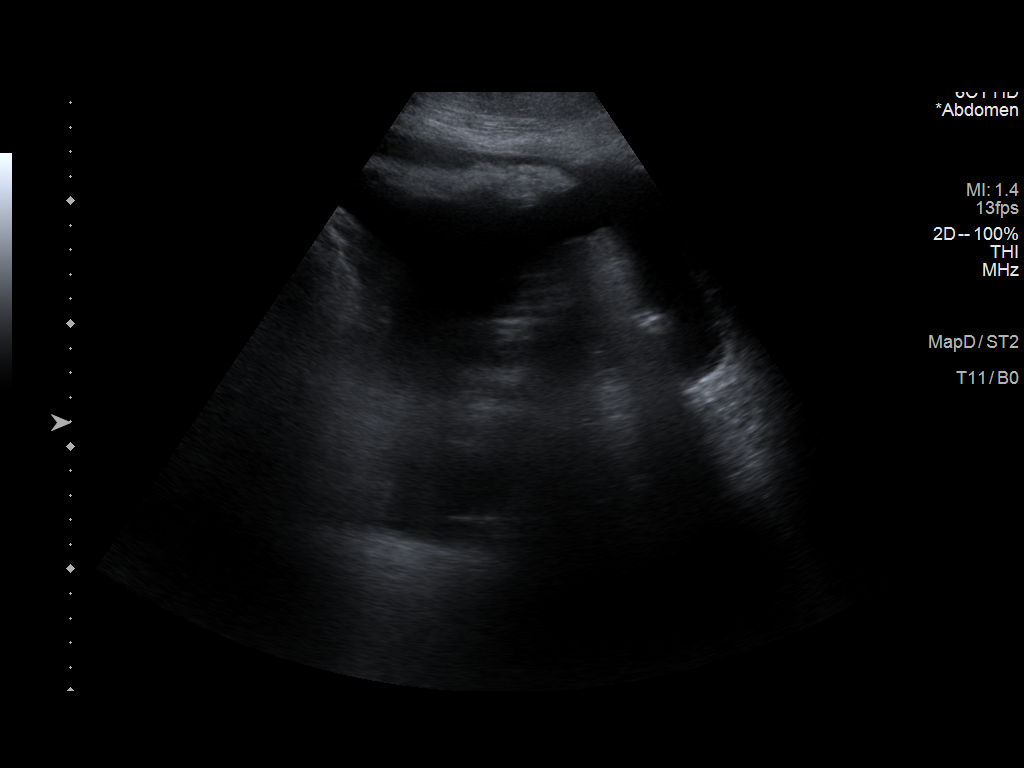
[im 4/5]
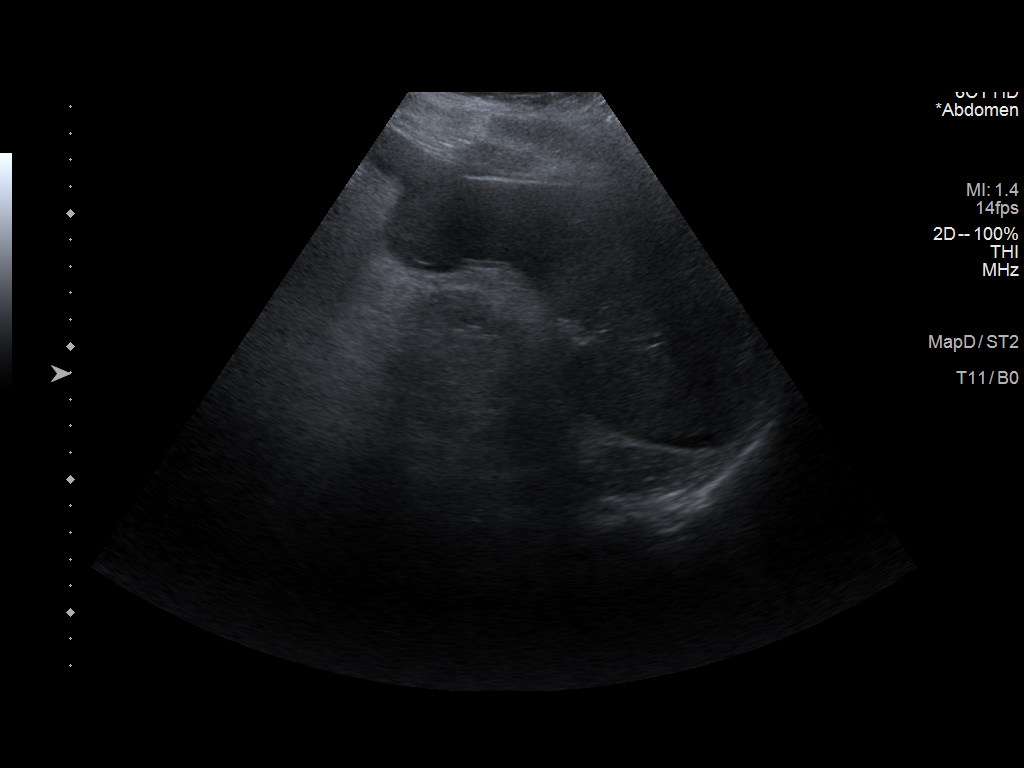
[im 5/5]
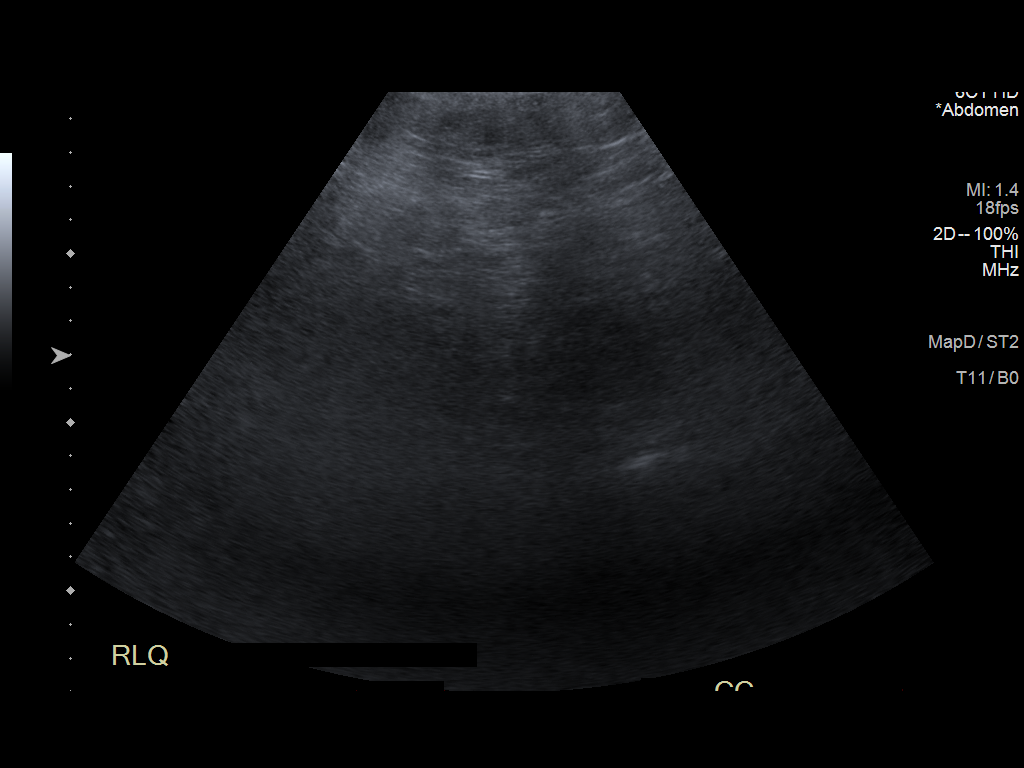

[5 of 5 positions shown; findings below may reference images not displayed]

EXAM:
ULTRASOUND GUIDED THERAPEUTIC AND DIAGNOSTIC PARACENTESIS

MEDICATIONS:
Lidocaine 1% 10 mL.

COMPLICATIONS:
None immediate.

PROCEDURE:
Informed written consent was obtained from the patient after a
discussion of the risks, benefits and alternatives to treatment. A
timeout was performed prior to the initiation of the procedure.

Initial ultrasound scanning demonstrates a moderate amount of
ascites within the right lower abdominal quadrant. The right lower
abdomen was prepped and draped in the usual sterile fashion. 1%
lidocaine was used for local anesthesia.

Following this, a 6 Fr Safe-T-Centesis catheter was introduced. An
ultrasound image was saved for documentation purposes. The
paracentesis was performed. The catheter was removed and a dressing
was applied. The patient tolerated the procedure well without
immediate post procedural complication.
Patient received post-procedure intravenous albumin; see nursing
notes for details.
FINDINGS: A total of approximately 4.22 L of straw-colored fluid was removed.
Samples were sent to the laboratory as requested by the clinical
team.
IMPRESSION: Successful ultrasound-guided therapeutic and diagnostic paracentesis
yielding 4.22 liters of peritoneal fluid.

Read by: Nguyen Gorton, NP

## 2022-08-17 ENCOUNTER — Encounter: Payer: Self-pay | Admitting: Oncology

## 2022-08-17 ENCOUNTER — Other Ambulatory Visit: Payer: Self-pay | Admitting: Internal Medicine

## 2022-08-17 DIAGNOSIS — Z1231 Encounter for screening mammogram for malignant neoplasm of breast: Secondary | ICD-10-CM

## 2022-08-24 ENCOUNTER — Inpatient Hospital Stay: Payer: Medicare Other | Attending: Oncology

## 2022-08-24 DIAGNOSIS — D509 Iron deficiency anemia, unspecified: Secondary | ICD-10-CM | POA: Insufficient documentation

## 2022-08-24 LAB — COMPREHENSIVE METABOLIC PANEL
ALT: 15 U/L (ref 0–44)
AST: 26 U/L (ref 15–41)
Albumin: 3.6 g/dL (ref 3.5–5.0)
Alkaline Phosphatase: 68 U/L (ref 38–126)
Anion gap: 8 (ref 5–15)
BUN: 45 mg/dL — ABNORMAL HIGH (ref 8–23)
CO2: 26 mmol/L (ref 22–32)
Calcium: 9.2 mg/dL (ref 8.9–10.3)
Chloride: 99 mmol/L (ref 98–111)
Creatinine, Ser: 1.8 mg/dL — ABNORMAL HIGH (ref 0.44–1.00)
GFR, Estimated: 28 mL/min — ABNORMAL LOW (ref >60)
Glucose, Bld: 118 mg/dL — ABNORMAL HIGH (ref 70–99)
Potassium: 4.9 mmol/L (ref 3.5–5.1)
Sodium: 133 mmol/L — ABNORMAL LOW (ref 135–145)
Total Bilirubin: 0.8 mg/dL (ref 0.3–1.2)
Total Protein: 6.7 g/dL (ref 6.5–8.1)

## 2022-08-24 LAB — CBC WITH DIFFERENTIAL/PLATELET
Abs Immature Granulocytes: 0.04 10*3/uL (ref 0.00–0.07)
Basophils Absolute: 0 10*3/uL (ref 0.0–0.1)
Basophils Relative: 1 %
Eosinophils Absolute: 0.4 10*3/uL (ref 0.0–0.5)
Eosinophils Relative: 7 %
HCT: 31.9 % — ABNORMAL LOW (ref 36.0–46.0)
Hemoglobin: 10.1 g/dL — ABNORMAL LOW (ref 12.0–15.0)
Immature Granulocytes: 1 %
Lymphocytes Relative: 20 %
Lymphs Abs: 1.2 10*3/uL (ref 0.7–4.0)
MCH: 29.1 pg (ref 26.0–34.0)
MCHC: 31.7 g/dL (ref 30.0–36.0)
MCV: 91.9 fL (ref 80.0–100.0)
Monocytes Absolute: 0.5 10*3/uL (ref 0.1–1.0)
Monocytes Relative: 7 %
Neutro Abs: 3.9 10*3/uL (ref 1.7–7.7)
Neutrophils Relative %: 64 %
Platelets: 210 10*3/uL (ref 150–400)
RBC: 3.47 MIL/uL — ABNORMAL LOW (ref 3.87–5.11)
RDW: 13.9 % (ref 11.5–15.5)
WBC: 6.1 10*3/uL (ref 4.0–10.5)
nRBC: 0 % (ref 0.0–0.2)

## 2022-08-24 LAB — IRON AND TIBC
Iron: 80 ug/dL (ref 28–170)
Saturation Ratios: 17 % (ref 10.4–31.8)
TIBC: 468 ug/dL — ABNORMAL HIGH (ref 250–450)
UIBC: 388 ug/dL

## 2022-08-24 LAB — FERRITIN: Ferritin: 36 ng/mL (ref 11–307)

## 2022-08-26 ENCOUNTER — Encounter: Payer: Self-pay | Admitting: Oncology

## 2022-08-26 ENCOUNTER — Inpatient Hospital Stay: Payer: Medicare Other

## 2022-08-26 ENCOUNTER — Inpatient Hospital Stay (HOSPITAL_BASED_OUTPATIENT_CLINIC_OR_DEPARTMENT_OTHER): Payer: Medicare Other | Admitting: Oncology

## 2022-08-26 VITALS — BP 143/53 | HR 82 | Temp 96.5°F | Resp 18 | Wt 165.0 lb

## 2022-08-26 DIAGNOSIS — D500 Iron deficiency anemia secondary to blood loss (chronic): Secondary | ICD-10-CM | POA: Diagnosis not present

## 2022-08-26 DIAGNOSIS — D509 Iron deficiency anemia, unspecified: Secondary | ICD-10-CM

## 2022-08-26 DIAGNOSIS — K7581 Nonalcoholic steatohepatitis (NASH): Secondary | ICD-10-CM

## 2022-08-26 DIAGNOSIS — K7460 Unspecified cirrhosis of liver: Secondary | ICD-10-CM | POA: Diagnosis not present

## 2022-08-26 MED ORDER — SODIUM CHLORIDE 0.9 % IV SOLN
200.0000 mg | Freq: Once | INTRAVENOUS | Status: AC
  Administered 2022-08-26: 200 mg via INTRAVENOUS
  Filled 2022-08-26: qty 200

## 2022-08-26 MED ORDER — SODIUM CHLORIDE 0.9 % IV SOLN
INTRAVENOUS | Status: DC
  Filled 2022-08-26: qty 250

## 2022-08-26 NOTE — Progress Notes (Signed)
Patient declined to wait the 30 minutes for post iron infusion observation today.  Post iron education done. Patient verbalized understanding. 

## 2022-08-26 NOTE — Assessment & Plan Note (Signed)
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 

## 2022-08-26 NOTE — Progress Notes (Signed)
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   Orders Placed This Encounter  Procedures   CBC with Differential/Platelet    Standing Status:   Future    Standing Expiration Date:   08/26/2023   Ferritin    Standing Status:   Future    Standing Expiration Date:   08/26/2023   Iron and TIBC([facility])    Standing Status:   Future    Standing Expiration Date:   08/26/2023   Follow up in 6 months.  All questions were answered. The patient knows to call the clinic with any problems, questions or concerns.  Rickard Patience, MD, PhD [facility] Hematology Oncology 08/26/2022     HISTORY OF PRESENTING ILLNESS:  Rhonda Tyler is a  82 y.o.  female with PMH listed below who was referred to me for evaluation of iron deficiency anemia Reviewed patient's recent labs that was done at [facility] office. 05/02/2018 Labs revealed anemia with hemoglobin of 9.3, MCV 74.7, wbc 6.1, platelet 343,000 Iron panel showed TIBC 602, iron saturation 9, ferritin 14.   Reviewed patient's previous labs ordered by primary care physician's office, anemia is chronic onset , duration is since April 2019 No aggravating or improving factors.  Associated signs and symptoms: Patient reports fatigue. Mild  SOB with exertion.  Denies weight loss, easy bruising, hematochezia, hemoptysis, hematuria. Context:  History of iron deficiency:   Rectal bleeding: deneis Hematemesis or hemoptysis : denies Blood in urine : denies  Last endoscopy: 03/08/2019 upper endoscopy showed many gastric polyps. 11/29/2017 The entire examined colon is normal. - No specimens collected  11/29/2017 Upper endosocpy, Multiple gastric polyps. The largest with stigmata of bleeding resected and retrieved. Clip (MR conditional) was placed. Pathology showed hyperplastic gastric polyps.  Fatigue: Yes.   She is on Aspirin 81mg  daily,   INTERVAL HISTORY Rhonda Tyler is a 82 y.o. female who has above history reviewed by me today presents for follow up visit for management of iron deficiency anemia Received IV Venofer treatments previously.  Tolerated well. + fatigue. No new complaints. Denies hematochezia, hematuria, hematemesis, epistaxis, black tarry stool .      Review of Systems  Constitutional:  Positive for fatigue. Negative for appetite change, chills and fever.  HENT:   Negative for hearing loss and voice change.   Eyes:  Negative for eye problems.  Respiratory:  Negative for chest tightness and cough.   Cardiovascular:  Negative for chest pain.  Gastrointestinal:  Negative for abdominal distention, abdominal pain, blood in stool and nausea.  Endocrine: Negative for hot flashes.  Genitourinary:  Negative for difficulty urinating and frequency.   Musculoskeletal:  Positive for arthralgias.  Skin:  Negative for itching and rash.  Neurological:  Negative for extremity weakness.  Hematological:  Negative for adenopathy.  Psychiatric/Behavioral:  Negative for confusion.     MEDICAL HISTORY:  Past Medical History:  Diagnosis Date   Anginal pain    Arthritis  B12 deficiency anemia    Cervical disc disease    Cervical disc disease    Chest pain    Cholecystolithiasis    Chronic kidney disease    Cirrhosis of liver 11/2019   Colon polyp    Coronary artery disease    Cutaneous sarcoidosis    Diabetes mellitus without complication    Pt  takes insulin   Diabetic neuropathy    GERD (gastroesophageal reflux disease)    Headache    migaines in the past   Hemorrhoid    History of diabetic neuropathy    Hypercalcemia    Hyperlipemia    Hypertension    IBS (irritable bowel syndrome)    Iron deficiency anemia    Myocardial infarction    Pneumonia    Sleep apnea    NO CPAP    SURGICAL HISTORY: Past Surgical History:  Procedure Laterality Date   ABDOMINAL HYSTERECTOMY     arthroscopic rotator cuff repair     BACK SURGERY     CARDIAC CATHETERIZATION     CATARACT EXTRACTION W/PHACO Right 01/26/2022   Procedure: CATARACT EXTRACTION PHACO AND INTRAOCULAR LENS PLACEMENT (IOC) RIGHT DIABETIC;  Surgeon: Nevada Crane, MD;  Location: [facility];  Service: Ophthalmology;  Laterality: Right;  2.90 0:29.3   CATARACT EXTRACTION W/PHACO Left 02/09/2022   Procedure: CATARACT EXTRACTION PHACO AND INTRAOCULAR LENS PLACEMENT (IOC) LEFT DIABETIC 3.54 00:32.3;  Surgeon: Nevada Crane, MD;  Location: [facility];  Service: Ophthalmology;  Laterality: Left;  Diabetic   CHOLECYSTECTOMY     COLONOSCOPY     COLONOSCOPY WITH PROPOFOL N/A 11/29/2017   Procedure: COLONOSCOPY WITH PROPOFOL;  Surgeon: Christena Deem, MD;  Location: [facility];  Service: Endoscopy;  Laterality: N/A;   CORONARY ANGIOPLASTY     PTCA and stent of RCA   CORONARY ARTERY BYPASS GRAFT     ESOPHAGOGASTRODUODENOSCOPY     ESOPHAGOGASTRODUODENOSCOPY (EGD) WITH PROPOFOL N/A 11/29/2017   Procedure: ESOPHAGOGASTRODUODENOSCOPY (EGD) WITH PROPOFOL;  Surgeon: Christena Deem, MD;  Location: [facility] END[facility]COPY;  Service: Endoscopy;  Laterality: N/A;   ESOPHAGOGASTRODUODENOSCOPY (EGD) WITH PROPOFOL N/A 03/07/2018   Procedure: ESOPHAGOGASTRODUODENOSCOPY (EGD) WITH PROPOFOL;  Surgeon: Christena Deem, MD;  Location: [facility] END[facility]COPY;  Service: Endoscopy;  Laterality: N/A;   ESOPHAGOGASTRODUODENOSCOPY (EGD) WITH PROPOFOL N/A 08/16/2019   Procedure:  ESOPHAGOGASTRODUODENOSCOPY (EGD) WITH PROPOFOL;  Surgeon: Toledo, Boykin Nearing, MD;  Location: [facility] ENDOSCOPY;  Service: Gastroenterology;  Laterality: N/A;   EXCISION OF ADNEXAL MASS     JOINT REPLACEMENT  04/2021   left total knee replacement   OOPHORECTOMY     TOTAL KNEE REVISION Left 05/07/2021   Procedure: TOTAL KNEE REVISION;  Surgeon: Donato Heinz, MD;  Location: [facility] ORS;  Service: Orthopedics;  Laterality: Left;   vesicular vaginal fistula      SOCIAL HISTORY: Social History   Socioeconomic History   Marital status: Widowed    Spouse name: Not on file   Number of children: Not on file   Years of education: Not on file   Highest education level: Not on file  Occupational History   Occupation: retired  Tobacco Use   Smoking status: Former    Types: Cigarettes    Quit date: 12/01/1970    Years since quitting: 51.7   Smokeless tobacco: Never  Vaping Use   Vaping Use: Never used  Substance and Sexual Activity   Alcohol use: Not Currently   Drug use: Never   Sexual activity: Not on file  Other Topics Concern   Not on file  Social History Narrative   Not on file   Social Determinants of Health   Financial Resource Strain: Not on file  Food Insecurity: Not on file  Transportation Needs: Not on file  Physical Activity: Not on file  Stress: Not on file  Social Connections: Not on file  Intimate Partner Violence: Not on file    FAMILY HISTORY: Family History  Problem Relation Age of Onset   Hypertension Mother    Heart attack Mother    Hypertension Father    Heart attack Father    Skin cancer Father    Heart attack Sister    Breast cancer Neg Hx     ALLERGIES:  is allergic to levaquin [levofloxacin in d5w], penicillins, codeine, lipitor [atorvastatin calcium], lopressor [metoprolol tartrate], potassium-containing compounds, procardia [nifedipine], sucralfate, tramadol, allegra [fexofenadine], naltrexone, and sulfa antibiotics.  MEDICATIONS:  Current  Outpatient Medications  Medication Sig Dispense Refill   acetaminophen (TYLENOL) 500 MG tablet Take 500-1,000 mg by mouth every 4 (four) hours as needed for mild pain or fever.     cholecalciferol (VITAMIN D3) 25 MCG (1000 UNIT) tablet Take 1,000 Units by mouth daily.     Cyanocobalamin (B-12) 2500 MCG TABS Take 2,500 mcg by mouth daily.     diltiazem (CARDIZEM CD) 120 MG 24 hr capsule Take 1 capsule (120 mg total) by mouth daily. 30 capsule 0   Ferrous Sulfate (IRON) 325 (65 Fe) MG TABS Take by mouth daily.     furosemide (LASIX) 40 MG tablet Take 40 mg by mouth daily.     gabapentin (NEURONTIN) 100 MG capsule Take 100 mg by mouth 3 (three) times daily.     gabapentin (NEURONTIN) 100 MG capsule Take 1 capsule by mouth 3 (three) times daily.     HYDROcodone-acetaminophen (NORCO/VICODIN) 5-325 MG tablet Take 1 tablet by mouth every 6 (six) hours as needed for moderate pain.     hydroxychloroquine (PLAQUENIL) 200 MG tablet Take 200 mg by mouth daily.     hydrOXYzine (ATARAX) 25 MG tablet Take 25 mg by mouth daily.     insulin NPH-regular Human (NOVOLIN 70/30) (70-30) 100 UNIT/ML injection Inject 20 Units into the skin 2 (two) times daily with a meal.     insulin regular (NOVOLIN R) 100 units/mL injection Inject 1-10 Units into the skin daily. lunchtime     lisinopril (ZESTRIL) 5 MG tablet Take 5 mg by mouth daily.     loratadine (CLARITIN) 10 MG tablet Take 10 mg by mouth daily.     magnesium oxide (MAG-OX) 400 (240 Mg) MG tablet Take 400 mg by mouth 2 (two) times daily.     Multiple Vitamin (MULTIVITAMIN WITH MINERALS) TABS tablet Take 1 tablet by mouth at bedtime.     pantoprazole (PROTONIX) 40 MG tablet Take 40 mg by mouth 2 (two) times daily.     propranolol ER (INDERAL LA) 60 MG 24 hr capsule Take 1 capsule (60 mg total) by mouth daily. 30 capsule 0   SANTYL 250 UNIT/GM ointment Apply 1 application. topically daily.     senna (SENOKOT) 8.6 MG tablet Take 1 tablet by mouth daily.      sertraline (ZOLOFT) 100 MG tablet Take 100 mg by mouth daily.     spironolactone (ALDACTONE) 50 MG tablet Take 50 mg by mouth daily.     triamcinolone cream (KENALOG) 0.1 % Apply 1 Application topically 2 (two) times daily as needed.  fenofibrate 160 MG tablet Take 160 mg by mouth daily. (Patient not taking: Reported on 02/24/2022)     meclizine (ANTIVERT) 25 MG tablet Take 25 mg by mouth every 4 (four) hours as needed for dizziness. (Patient not taking: Reported on 02/24/2022)     nitroGLYCERIN (NITROSTAT) 0.4 MG SL tablet Place 0.4 mg under the tongue every 5 (five) minutes as needed for chest pain. (Patient not taking: Reported on 02/24/2022)     ondansetron (ZOFRAN) 4 MG tablet Take 4 mg by mouth every 8 (eight) hours as needed for vomiting or nausea. (Patient not taking: Reported on 02/24/2022)     No current facility-administered medications for this visit.   Facility-Administered Medications Ordered in Other Visits  Medication Dose Route Frequency Provider Last Rate Last Admin   0.9 %  sodium chloride infusion   Intravenous Continuous Rickard Patience, MD   Stopped at 08/26/22 1512     PHYSICAL EXAMINATION: ECOG PERFORMANCE STATUS: 1 - Symptomatic but completely ambulatory Vitals:   08/26/22 1401  BP: (!) 143/53  Pulse: 82  Resp: 18  Temp: (!) 96.5 F (35.8 C)  SpO2: 100%   Filed Weights   08/26/22 1401  Weight: 165 lb (74.8 kg)    Physical Exam Constitutional:      General: She is not in acute distress. HENT:     Head: Normocephalic and atraumatic.  Eyes:     General: No scleral icterus.    Pupils: Pupils are equal, round, and reactive to light.  Cardiovascular:     Rate and Rhythm: Normal rate and regular rhythm.     Heart sounds: Normal heart sounds.  Pulmonary:     Effort: Pulmonary effort is normal. No respiratory distress.     Breath sounds: No wheezing.  Abdominal:     General: Bowel sounds are normal. There is no distension.     Palpations: Abdomen is soft.  There is no mass.     Tenderness: There is no abdominal tenderness.  Musculoskeletal:        General: No deformity. Normal range of motion.     Cervical back: Normal range of motion and neck supple.  Skin:    General: Skin is warm and dry.     Coloration: Skin is not pale.     Findings: No erythema or rash.  Neurological:     Mental Status: She is alert and oriented to person, place, and time.     Cranial Nerves: No cranial nerve deficit.     Coordination: Coordination normal.  Psychiatric:        Behavior: Behavior normal.        Thought Content: Thought content normal.           LABORATORY DATA:  I have reviewed the data as listed    Latest Ref Rng & Units 08/24/2022    2:01 PM 02/23/2022   10:15 AM 10/13/2021   10:56 AM  CBC  WBC 4.0 - 10.5 K/uL 6.1  4.4  5.6   Hemoglobin 12.0 - 15.0 g/dL 16.1  09.6  04.5   Hematocrit 36.0 - 46.0 % 31.9  32.5  39.0   Platelets 150 - 400 K/uL 210  201  224       Latest Ref Rng & Units 08/24/2022    2:01 PM 10/14/2021    4:31 AM 10/13/2021   10:56 AM  CMP  Glucose 70 - 99 mg/dL 409  811  914   BUN 8 - 23 mg/dL 45  35  29   Creatinine 0.44 - 1.00 mg/dL 1.61  0.96  0.45   Sodium 135 - 145 mmol/L 133  131  133   Potassium 3.5 - 5.1 mmol/L 4.9  4.2  4.5   Chloride 98 - 111 mmol/L 99  100  96   CO2 22 - 32 mmol/L 26  25  22    Calcium 8.9 - 10.3 mg/dL 9.2  8.8  40.9   Total Protein 6.5 - 8.1 g/dL 6.7   7.2   Total Bilirubin 0.3 - 1.2 mg/dL 0.8   1.7   Alkaline Phos 38 - 126 U/L 68   108   AST 15 - 41 U/L 26   27   ALT 0 - 44 U/L 15   12        Component Value Date/Time   IRON 80 08/24/2022 1401   TIBC 468 (H) 08/24/2022 1401   FERRITIN 36 08/24/2022 1401   IRONPCTSAT 17 08/24/2022 1401     No results found.

## 2022-08-26 NOTE — Assessment & Plan Note (Addendum)
#  Iron deficiency anemia, Labs reviewed and discussed  Lab Results  Component Value Date   HGB 10.1 (L) 08/24/2022   TIBC 468 (H) 08/24/2022   IRONPCTSAT 17 08/24/2022   FERRITIN 36 08/24/2022    Recommend patient to proceed with IV Venofer weekly x 2

## 2022-09-01 MED FILL — Iron Sucrose Inj 20 MG/ML (Fe Equiv): INTRAVENOUS | Qty: 10 | Status: AC

## 2022-09-02 ENCOUNTER — Inpatient Hospital Stay: Payer: Medicare Other

## 2022-09-11 MED FILL — Iron Sucrose Inj 20 MG/ML (Fe Equiv): INTRAVENOUS | Qty: 10 | Status: AC

## 2022-09-13 ENCOUNTER — Inpatient Hospital Stay
Admission: EM | Admit: 2022-09-13 | Discharge: 2022-09-22 | DRG: 194 | Disposition: A | Discharge status: Home Health | Payer: Medicare Other | Attending: Internal Medicine | Admitting: Internal Medicine

## 2022-09-13 ENCOUNTER — Emergency Department: Payer: Medicare Other

## 2022-09-13 ENCOUNTER — Other Ambulatory Visit: Payer: Self-pay

## 2022-09-13 DIAGNOSIS — Z9181 History of falling: Secondary | ICD-10-CM

## 2022-09-13 DIAGNOSIS — I2510 Atherosclerotic heart disease of native coronary artery without angina pectoris: Secondary | ICD-10-CM | POA: Diagnosis present

## 2022-09-13 DIAGNOSIS — I25810 Atherosclerosis of coronary artery bypass graft(s) without angina pectoris: Secondary | ICD-10-CM | POA: Diagnosis not present

## 2022-09-13 DIAGNOSIS — I10 Essential (primary) hypertension: Secondary | ICD-10-CM | POA: Diagnosis present

## 2022-09-13 DIAGNOSIS — K219 Gastro-esophageal reflux disease without esophagitis: Secondary | ICD-10-CM | POA: Diagnosis present

## 2022-09-13 DIAGNOSIS — N390 Urinary tract infection, site not specified: Secondary | ICD-10-CM | POA: Diagnosis present

## 2022-09-13 DIAGNOSIS — Z1152 Encounter for screening for COVID-19: Secondary | ICD-10-CM | POA: Diagnosis not present

## 2022-09-13 DIAGNOSIS — R0602 Shortness of breath: Secondary | ICD-10-CM | POA: Diagnosis not present

## 2022-09-13 DIAGNOSIS — D519 Vitamin B12 deficiency anemia, unspecified: Secondary | ICD-10-CM | POA: Diagnosis present

## 2022-09-13 DIAGNOSIS — I252 Old myocardial infarction: Secondary | ICD-10-CM

## 2022-09-13 DIAGNOSIS — N1832 Chronic kidney disease, stage 3b: Secondary | ICD-10-CM | POA: Diagnosis not present

## 2022-09-13 DIAGNOSIS — J9811 Atelectasis: Secondary | ICD-10-CM | POA: Diagnosis present

## 2022-09-13 DIAGNOSIS — I8510 Secondary esophageal varices without bleeding: Secondary | ICD-10-CM | POA: Diagnosis present

## 2022-09-13 DIAGNOSIS — W010XXA Fall on same level from slipping, tripping and stumbling without subsequent striking against object, initial encounter: Secondary | ICD-10-CM | POA: Diagnosis present

## 2022-09-13 DIAGNOSIS — D863 Sarcoidosis of skin: Secondary | ICD-10-CM | POA: Diagnosis present

## 2022-09-13 DIAGNOSIS — K7460 Unspecified cirrhosis of liver: Secondary | ICD-10-CM | POA: Diagnosis present

## 2022-09-13 DIAGNOSIS — T796XXA Traumatic ischemia of muscle, initial encounter: Secondary | ICD-10-CM | POA: Diagnosis present

## 2022-09-13 DIAGNOSIS — Z96652 Presence of left artificial knee joint: Secondary | ICD-10-CM | POA: Diagnosis present

## 2022-09-13 DIAGNOSIS — Z951 Presence of aortocoronary bypass graft: Secondary | ICD-10-CM

## 2022-09-13 DIAGNOSIS — K589 Irritable bowel syndrome without diarrhea: Secondary | ICD-10-CM | POA: Diagnosis present

## 2022-09-13 DIAGNOSIS — E785 Hyperlipidemia, unspecified: Secondary | ICD-10-CM | POA: Diagnosis present

## 2022-09-13 DIAGNOSIS — Z881 Allergy status to other antibiotic agents status: Secondary | ICD-10-CM

## 2022-09-13 DIAGNOSIS — I129 Hypertensive chronic kidney disease with stage 1 through stage 4 chronic kidney disease, or unspecified chronic kidney disease: Secondary | ICD-10-CM | POA: Diagnosis present

## 2022-09-13 DIAGNOSIS — Z888 Allergy status to other drugs, medicaments and biological substances status: Secondary | ICD-10-CM

## 2022-09-13 DIAGNOSIS — Z8249 Family history of ischemic heart disease and other diseases of the circulatory system: Secondary | ICD-10-CM | POA: Diagnosis not present

## 2022-09-13 DIAGNOSIS — J189 Pneumonia, unspecified organism: Principal | ICD-10-CM | POA: Diagnosis present

## 2022-09-13 DIAGNOSIS — E1140 Type 2 diabetes mellitus with diabetic neuropathy, unspecified: Secondary | ICD-10-CM | POA: Diagnosis present

## 2022-09-13 DIAGNOSIS — G4733 Obstructive sleep apnea (adult) (pediatric): Secondary | ICD-10-CM | POA: Diagnosis present

## 2022-09-13 DIAGNOSIS — E872 Acidosis, unspecified: Secondary | ICD-10-CM | POA: Diagnosis present

## 2022-09-13 DIAGNOSIS — Z86010 Personal history of colonic polyps: Secondary | ICD-10-CM

## 2022-09-13 DIAGNOSIS — K7581 Nonalcoholic steatohepatitis (NASH): Secondary | ICD-10-CM

## 2022-09-13 DIAGNOSIS — R7989 Other specified abnormal findings of blood chemistry: Secondary | ICD-10-CM | POA: Diagnosis not present

## 2022-09-13 DIAGNOSIS — R531 Weakness: Secondary | ICD-10-CM

## 2022-09-13 DIAGNOSIS — E1165 Type 2 diabetes mellitus with hyperglycemia: Secondary | ICD-10-CM | POA: Diagnosis present

## 2022-09-13 DIAGNOSIS — Y92009 Unspecified place in unspecified non-institutional (private) residence as the place of occurrence of the external cause: Secondary | ICD-10-CM

## 2022-09-13 DIAGNOSIS — S8992XA Unspecified injury of left lower leg, initial encounter: Secondary | ICD-10-CM

## 2022-09-13 DIAGNOSIS — Z955 Presence of coronary angioplasty implant and graft: Secondary | ICD-10-CM

## 2022-09-13 DIAGNOSIS — Z87891 Personal history of nicotine dependence: Secondary | ICD-10-CM

## 2022-09-13 DIAGNOSIS — M79605 Pain in left leg: Secondary | ICD-10-CM

## 2022-09-13 DIAGNOSIS — B9620 Unspecified Escherichia coli [E. coli] as the cause of diseases classified elsewhere: Secondary | ICD-10-CM | POA: Diagnosis present

## 2022-09-13 DIAGNOSIS — R748 Abnormal levels of other serum enzymes: Secondary | ICD-10-CM | POA: Diagnosis present

## 2022-09-13 DIAGNOSIS — Z79899 Other long term (current) drug therapy: Secondary | ICD-10-CM

## 2022-09-13 DIAGNOSIS — R519 Headache, unspecified: Secondary | ICD-10-CM | POA: Diagnosis present

## 2022-09-13 DIAGNOSIS — D509 Iron deficiency anemia, unspecified: Secondary | ICD-10-CM | POA: Diagnosis present

## 2022-09-13 DIAGNOSIS — Z885 Allergy status to narcotic agent status: Secondary | ICD-10-CM

## 2022-09-13 DIAGNOSIS — Z794 Long term (current) use of insulin: Secondary | ICD-10-CM

## 2022-09-13 DIAGNOSIS — E11649 Type 2 diabetes mellitus with hypoglycemia without coma: Secondary | ICD-10-CM | POA: Diagnosis not present

## 2022-09-13 DIAGNOSIS — Z880 Allergy status to penicillin: Secondary | ICD-10-CM

## 2022-09-13 DIAGNOSIS — M1990 Unspecified osteoarthritis, unspecified site: Secondary | ICD-10-CM | POA: Diagnosis present

## 2022-09-13 DIAGNOSIS — K5900 Constipation, unspecified: Secondary | ICD-10-CM | POA: Diagnosis present

## 2022-09-13 DIAGNOSIS — Z90710 Acquired absence of both cervix and uterus: Secondary | ICD-10-CM

## 2022-09-13 DIAGNOSIS — Z882 Allergy status to sulfonamides status: Secondary | ICD-10-CM

## 2022-09-13 DIAGNOSIS — E1122 Type 2 diabetes mellitus with diabetic chronic kidney disease: Secondary | ICD-10-CM | POA: Diagnosis present

## 2022-09-13 LAB — CBC WITH DIFFERENTIAL/PLATELET
Abs Immature Granulocytes: 0.07 10*3/uL (ref 0.00–0.07)
Basophils Absolute: 0 10*3/uL (ref 0.0–0.1)
Basophils Relative: 0 %
Eosinophils Absolute: 0 10*3/uL (ref 0.0–0.5)
Eosinophils Relative: 0 %
HCT: 35.8 % — ABNORMAL LOW (ref 36.0–46.0)
Hemoglobin: 11.3 g/dL — ABNORMAL LOW (ref 12.0–15.0)
Immature Granulocytes: 1 %
Lymphocytes Relative: 5 %
Lymphs Abs: 0.6 10*3/uL — ABNORMAL LOW (ref 0.7–4.0)
MCH: 29.2 pg (ref 26.0–34.0)
MCHC: 31.6 g/dL (ref 30.0–36.0)
MCV: 92.5 fL (ref 80.0–100.0)
Monocytes Absolute: 0.4 10*3/uL (ref 0.1–1.0)
Monocytes Relative: 4 %
Neutro Abs: 10.2 10*3/uL — ABNORMAL HIGH (ref 1.7–7.7)
Neutrophils Relative %: 90 %
Platelets: 193 10*3/uL (ref 150–400)
RBC: 3.87 MIL/uL (ref 3.87–5.11)
RDW: 14.1 % (ref 11.5–15.5)
WBC: 11.3 10*3/uL — ABNORMAL HIGH (ref 4.0–10.5)
nRBC: 0 % (ref 0.0–0.2)

## 2022-09-13 LAB — URINALYSIS, W/ REFLEX TO CULTURE (INFECTION SUSPECTED)
Bilirubin Urine: NEGATIVE
Glucose, UA: NEGATIVE mg/dL
Hgb urine dipstick: NEGATIVE
Ketones, ur: 5 mg/dL — AB
Nitrite: NEGATIVE
Protein, ur: NEGATIVE mg/dL
Specific Gravity, Urine: 1.014 (ref 1.005–1.030)
pH: 5 (ref 5.0–8.0)

## 2022-09-13 LAB — LACTIC ACID, PLASMA: Lactic Acid, Venous: 1.2 mmol/L (ref 0.5–1.9)

## 2022-09-13 LAB — COMPREHENSIVE METABOLIC PANEL
ALT: 22 U/L (ref 0–44)
AST: 48 U/L — ABNORMAL HIGH (ref 15–41)
Albumin: 3.7 g/dL (ref 3.5–5.0)
Alkaline Phosphatase: 73 U/L (ref 38–126)
Anion gap: 16 — ABNORMAL HIGH (ref 5–15)
BUN: 35 mg/dL — ABNORMAL HIGH (ref 8–23)
CO2: 19 mmol/L — ABNORMAL LOW (ref 22–32)
Calcium: 9.7 mg/dL (ref 8.9–10.3)
Chloride: 100 mmol/L (ref 98–111)
Creatinine, Ser: 1.68 mg/dL — ABNORMAL HIGH (ref 0.44–1.00)
GFR, Estimated: 31 mL/min — ABNORMAL LOW (ref >60)
Glucose, Bld: 159 mg/dL — ABNORMAL HIGH (ref 70–99)
Potassium: 4.7 mmol/L (ref 3.5–5.1)
Sodium: 135 mmol/L (ref 135–145)
Total Bilirubin: 2 mg/dL — ABNORMAL HIGH (ref 0.3–1.2)
Total Protein: 6.7 g/dL (ref 6.5–8.1)

## 2022-09-13 LAB — CK: Total CK: 641 U/L — ABNORMAL HIGH (ref 38–234)

## 2022-09-13 LAB — PROCALCITONIN: Procalcitonin: 0.11 ng/mL

## 2022-09-13 LAB — GLUCOSE, CAPILLARY: Glucose-Capillary: 148 mg/dL — ABNORMAL HIGH (ref 70–99)

## 2022-09-13 MED ORDER — ACETAMINOPHEN 325 MG PO TABS: 650.0000 mg | ORAL_TABLET | Freq: Four times a day (QID) | ORAL | Status: DC | PRN

## 2022-09-13 MED ORDER — SODIUM CHLORIDE 0.9 % IV SOLN
2.0000 g | INTRAVENOUS | Status: AC
  Administered 2022-09-15 – 2022-09-18 (×4): 2 g via INTRAVENOUS
  Filled 2022-09-13 (×2): qty 2
  Filled 2022-09-13 (×2): qty 20

## 2022-09-13 MED ORDER — SODIUM CHLORIDE 0.9 % IV BOLUS
500.0000 mL | Freq: Once | INTRAVENOUS | Status: AC
  Administered 2022-09-13: 500 mL via INTRAVENOUS

## 2022-09-13 MED ORDER — HEPARIN SODIUM (PORCINE) 5000 UNIT/ML IJ SOLN
5000.0000 [IU] | Freq: Three times a day (TID) | INTRAMUSCULAR | Status: DC
  Administered 2022-09-14 – 2022-09-21 (×25): 5000 [IU] via SUBCUTANEOUS
  Filled 2022-09-13 (×25): qty 1

## 2022-09-13 MED ORDER — SERTRALINE HCL 50 MG PO TABS
100.0000 mg | ORAL_TABLET | Freq: Every day | ORAL | Status: DC
  Administered 2022-09-14 – 2022-09-22 (×9): 100 mg via ORAL
  Filled 2022-09-13 (×9): qty 2

## 2022-09-13 MED ORDER — SODIUM CHLORIDE 0.9 % IV SOLN
2.0000 g | Freq: Once | INTRAVENOUS | Status: AC
  Administered 2022-09-13: 2 g via INTRAVENOUS
  Filled 2022-09-13: qty 20

## 2022-09-13 MED ORDER — OXYCODONE HCL 5 MG PO TABS
5.0000 mg | ORAL_TABLET | ORAL | Status: DC | PRN
  Administered 2022-09-14 – 2022-09-16 (×5): 5 mg via ORAL
  Filled 2022-09-13 (×6): qty 1

## 2022-09-13 MED ORDER — ONDANSETRON HCL 4 MG/2ML IJ SOLN
4.0000 mg | Freq: Four times a day (QID) | INTRAMUSCULAR | Status: DC | PRN
  Administered 2022-09-14 – 2022-09-15 (×2): 4 mg via INTRAVENOUS
  Filled 2022-09-13 (×2): qty 2

## 2022-09-13 MED ORDER — INSULIN GLARGINE-YFGN 100 UNIT/ML ~~LOC~~ SOLN
14.0000 [IU] | Freq: Two times a day (BID) | SUBCUTANEOUS | Status: DC
  Administered 2022-09-14 – 2022-09-20 (×13): 14 [IU] via SUBCUTANEOUS
  Filled 2022-09-13 (×15): qty 0.14

## 2022-09-13 MED ORDER — AZITHROMYCIN 500 MG PO TABS
500.0000 mg | ORAL_TABLET | Freq: Every day | ORAL | Status: AC
  Administered 2022-09-14 – 2022-09-17 (×4): 500 mg via ORAL
  Filled 2022-09-13 (×4): qty 1

## 2022-09-13 MED ORDER — SODIUM CHLORIDE 0.9 % IV SOLN
500.0000 mg | Freq: Once | INTRAVENOUS | Status: AC
  Administered 2022-09-14: 500 mg via INTRAVENOUS
  Filled 2022-09-13: qty 5

## 2022-09-13 MED ORDER — INSULIN ASPART 100 UNIT/ML IJ SOLN
0.0000 [IU] | Freq: Every day | INTRAMUSCULAR | Status: DC
  Administered 2022-09-16: 2 [IU] via SUBCUTANEOUS
  Filled 2022-09-13: qty 1

## 2022-09-13 MED ORDER — PROPRANOLOL HCL ER 60 MG PO CP24
60.0000 mg | ORAL_CAPSULE | Freq: Every day | ORAL | Status: DC
  Administered 2022-09-14 – 2022-09-22 (×9): 60 mg via ORAL
  Filled 2022-09-13 (×9): qty 1

## 2022-09-13 MED ORDER — MORPHINE SULFATE (PF) 2 MG/ML IV SOLN
2.0000 mg | Freq: Once | INTRAVENOUS | Status: AC
  Administered 2022-09-13: 2 mg via INTRAVENOUS
  Filled 2022-09-13: qty 1

## 2022-09-13 MED ORDER — DILTIAZEM HCL ER COATED BEADS 120 MG PO CP24
120.0000 mg | ORAL_CAPSULE | Freq: Every day | ORAL | Status: DC
  Administered 2022-09-14 – 2022-09-22 (×9): 120 mg via ORAL
  Filled 2022-09-13 (×9): qty 1

## 2022-09-13 MED ORDER — LISINOPRIL 5 MG PO TABS
5.0000 mg | ORAL_TABLET | Freq: Every day | ORAL | Status: DC
  Administered 2022-09-14 – 2022-09-22 (×8): 5 mg via ORAL
  Filled 2022-09-13 (×8): qty 1

## 2022-09-13 MED ORDER — SODIUM CHLORIDE 0.9 % IV SOLN: INTRAVENOUS | Status: AC

## 2022-09-13 MED ORDER — SENNA 8.6 MG PO TABS: 1.0000 | ORAL_TABLET | Freq: Every day | ORAL | Status: DC | PRN

## 2022-09-13 MED ORDER — ONDANSETRON HCL 4 MG PO TABS: 4.0000 mg | ORAL_TABLET | Freq: Four times a day (QID) | ORAL | Status: DC | PRN

## 2022-09-13 MED ORDER — ACETAMINOPHEN 650 MG RE SUPP: 650.0000 mg | Freq: Four times a day (QID) | RECTAL | Status: DC | PRN

## 2022-09-13 MED ORDER — INSULIN ASPART 100 UNIT/ML IJ SOLN
0.0000 [IU] | Freq: Three times a day (TID) | INTRAMUSCULAR | Status: DC
  Administered 2022-09-14 (×2): 3 [IU] via SUBCUTANEOUS
  Administered 2022-09-14 – 2022-09-15 (×2): 2 [IU] via SUBCUTANEOUS
  Administered 2022-09-15: 1 [IU] via SUBCUTANEOUS
  Administered 2022-09-15: 2 [IU] via SUBCUTANEOUS
  Administered 2022-09-16 (×2): 3 [IU] via SUBCUTANEOUS
  Administered 2022-09-16 – 2022-09-17 (×2): 1 [IU] via SUBCUTANEOUS
  Administered 2022-09-17: 3 [IU] via SUBCUTANEOUS
  Administered 2022-09-17: 2 [IU] via SUBCUTANEOUS
  Filled 2022-09-13 (×12): qty 1

## 2022-09-13 MED ORDER — PANTOPRAZOLE SODIUM 40 MG PO TBEC
40.0000 mg | DELAYED_RELEASE_TABLET | Freq: Two times a day (BID) | ORAL | Status: DC
  Administered 2022-09-14 – 2022-09-15 (×4): 40 mg via ORAL
  Filled 2022-09-13 (×4): qty 1

## 2022-09-13 MED ORDER — SODIUM CHLORIDE 0.9% FLUSH
3.0000 mL | Freq: Two times a day (BID) | INTRAVENOUS | Status: DC
  Administered 2022-09-14 – 2022-09-22 (×17): 3 mL via INTRAVENOUS

## 2022-09-13 NOTE — ED Provider Notes (Signed)
[facility] Provider Note    Event Date/Time   First MD Initiated Contact with Patient 09/13/22 1557     (approximate)   History   Chief Complaint: Fall   HPI  Rhonda Tyler is a 82 y.o. female with a history of GERD, hypertension, diabetes, cirrhosis who comes the ED complaining of a fall last night at home.  She was too weak to get up off the floor, so she stayed on the floor for the entire night until this morning when she was able to call EMS.  Complains of pain at her mid upper back, bilateral knees, and head.  She does report hitting her head.  Denies neck pain or paresthesias.  She reports that she has been feeling very fatigued for the last few days which is unlike her.     Physical Exam   Triage Vital Signs: ED Triage Vitals  Enc Vitals Group     BP 09/13/22 1601 (!) 162/76     Pulse Rate 09/13/22 1730 (!) 104     Resp 09/13/22 1601 (!) 21     Temp 09/13/22 1601 98.3 F (36.8 C)     Temp Source 09/13/22 1601 Oral     SpO2 09/13/22 1730 100 %     Weight --      Height --      Head Circumference --      Peak Flow --      Pain Score 09/13/22 1601 10     Pain Loc --      Pain Edu? --      Excl. in GC? --     Most recent vital signs: Vitals:   09/13/22 2030 09/13/22 2316  BP:  (!) 130/52  Pulse:  (!) 101  Resp:  20  Temp: 98.2 F (36.8 C) 97.6 F (36.4 C)  SpO2:  100%    General: Awake, no distress.  CV:  Good peripheral perfusion.  Tachycardia heart rate 105. Resp:  Normal effort.  Clear to auscultation bilaterally Abd:  No distention.  Soft nontender Other:  No obvious head trauma.  There is some tenderness to palpation at the midline C-spine around C6.  No step-off.  Also some tenderness along the lumbar spine and right knee.   ED Results / Procedures / Treatments   Labs (all labs ordered are listed, but only abnormal results are displayed) Labs Reviewed  COMPREHENSIVE METABOLIC PANEL - Abnormal; Notable for the  following components:      Result Value   CO2 19 (*)    Glucose, Bld 159 (*)    BUN 35 (*)    Creatinine, Ser 1.68 (*)    AST 48 (*)    Total Bilirubin 2.0 (*)    GFR, Estimated 31 (*)    Anion gap 16 (*)    All other components within normal limits  CBC WITH DIFFERENTIAL/PLATELET - Abnormal; Notable for the following components:   WBC 11.3 (*)    Hemoglobin 11.3 (*)    HCT 35.8 (*)    Neutro Abs 10.2 (*)    Lymphs Abs 0.6 (*)    All other components within normal limits  CK - Abnormal; Notable for the following components:   Total CK 641 (*)    All other components within normal limits  URINALYSIS, W/ REFLEX TO CULTURE (INFECTION SUSPECTED) - Abnormal; Notable for the following components:   Color, Urine YELLOW (*)    APPearance HAZY (*)  Ketones, ur 5 (*)    Leukocytes,Ua SMALL (*)    Bacteria, UA RARE (*)    All other components within normal limits  GLUCOSE, CAPILLARY - Abnormal; Notable for the following components:   Glucose-Capillary 148 (*)    All other components within normal limits  CULTURE, BLOOD (ROUTINE X 2)  CULTURE, BLOOD (ROUTINE X 2)  URINE CULTURE  LACTIC ACID, PLASMA  PROCALCITONIN  COMPREHENSIVE METABOLIC PANEL  CBC  PROCALCITONIN     EKG Interpreted by me Sinus tachycardia rate 103.  Left axis, normal intervals.  Poor R wave progression.  Normal ST segments and T waves.  No ischemic changes.   RADIOLOGY CT head interpreted by me, no mass or intracranial hemorrhage.  Radiology report reviewed.  CT cervical spine negative for fracture but does show opacities in upper lung fields  Chest x-ray concerning for lingular pneumonia  X-ray pelvis, L-spine, right knee all negative.   PROCEDURES:  Procedures   MEDICATIONS ORDERED IN ED: Medications  azithromycin (ZITHROMAX) 500 mg in sodium chloride 0.9 % 250 mL IVPB (has no administration in time range)  diltiazem (CARDIZEM CD) 24 hr capsule 120 mg (has no administration in time range)   lisinopril (ZESTRIL) tablet 5 mg (has no administration in time range)  propranolol ER (INDERAL LA) 24 hr capsule 60 mg (has no administration in time range)  sertraline (ZOLOFT) tablet 100 mg (has no administration in time range)  pantoprazole (PROTONIX) EC tablet 40 mg (has no administration in time range)  heparin injection 5,000 Units (has no administration in time range)  cefTRIAXone (ROCEPHIN) 2 g in sodium chloride 0.9 % 100 mL IVPB (has no administration in time range)  azithromycin (ZITHROMAX) tablet 500 mg (has no administration in time range)  sodium chloride flush (NS) 0.9 % injection 3 mL (has no administration in time range)  acetaminophen (TYLENOL) tablet 650 mg (has no administration in time range)    Or  acetaminophen (TYLENOL) suppository 650 mg (has no administration in time range)  senna (SENOKOT) tablet 8.6 mg (has no administration in time range)  oxyCODONE (Oxy IR/ROXICODONE) immediate release tablet 5 mg (has no administration in time range)  ondansetron (ZOFRAN) tablet 4 mg (has no administration in time range)    Or  ondansetron (ZOFRAN) injection 4 mg (has no administration in time range)  insulin glargine-yfgn (SEMGLEE) injection 14 Units (has no administration in time range)  insulin aspart (novoLOG) injection 0-9 Units (has no administration in time range)  insulin aspart (novoLOG) injection 0-5 Units (has no administration in time range)  0.9 %  sodium chloride infusion (has no administration in time range)  sodium chloride 0.9 % bolus 500 mL (0 mLs Intravenous Stopped 09/13/22 1822)  morphine (PF) 2 MG/ML injection 2 mg (2 mg Intravenous Given 09/13/22 1704)  sodium chloride 0.9 % bolus 500 mL (0 mLs Intravenous Stopped 09/13/22 2110)  cefTRIAXone (ROCEPHIN) 2 g in sodium chloride 0.9 % 100 mL IVPB (0 g Intravenous Stopped 09/13/22 2215)     IMPRESSION / MDM / ASSESSMENT AND PLAN / ED COURSE  I reviewed the triage vital signs and the nursing notes.  DDx:  Intracranial hemorrhage, C-spine fracture, electrolyte abnormality, AKI, dehydration, rhabdomyolysis, UTI, pneumonia, orthopedic injury  Patient's presentation is most consistent with acute presentation with potential threat to life or bodily function.  Patient presents with fall at home with generalized weakness.  Tachycardic on arrival.  Will obtain labs, x-rays, CT head and neck, give IV fluids for hydration.  Clinical Course as of 09/13/22 2355  Wynelle Link Sep 13, 2022  1841 DG Chest Portable 1 View [PS]  2100 Workup shows elevated CK level concerning for rhabdomyolysis.  Stable CKD.  Urinalysis suggestive of UTI.  Chest x-ray and imaged lung fields on CT cervical spine concerning for pneumonia.  Patient is still feeling very ill, still tachycardic after IV fluids, concern for developing sepsis.  Will give IV Rocephin and azithromycin and contact hospitalist for admission. [PS]  2111 Case discussed with hospitalist [PS]    Clinical Course User Index [PS] Sharman Cheek, MD     FINAL CLINICAL IMPRESSION(S) / ED DIAGNOSES   Final diagnoses:  Community acquired pneumonia of left lung, unspecified part of lung  Generalized weakness  Traumatic rhabdomyolysis, initial encounter [facility])     Rx / DC Orders   ED Discharge Orders     None        Note:  This document was prepared using Dragon voice recognition software and may include unintentional dictation errors.   Sharman Cheek, MD 09/13/22 2355

## 2022-09-13 NOTE — H&P (Addendum)
History and Physical    Rhonda Tyler:[id number] DOB: 1941/01/24 DOA: 09/13/2022  PCP: Lauro Regulus, MD   Patient coming from: Home   Chief Complaint: Fall, fatigue, weakness, aches, cough, SOB   HPI: Rhonda Tyler is a 82 y.o. female with medical history significant for hypertension, type 2 diabetes mellitus, CKD 3B, CAD status post CABG, and OSA who presents to the emergency department with generalized weakness, fatigue, aches, shortness of breath, and cough.  Patient reports that she did been experiencing a mild headache for a few days which is not unusual for her, but has also developed progressive fatigue and generalized weakness, loss of appetite, and now cough and developing shortness of breath.  He denies chest pain or focal numbness or weakness.  ED Course: Upon arrival to the ED, patient is found to be afebrile and saturating mid to upper 90s on room air with tachypnea, mild tachycardia, and systolic blood pressure in 120s.  EKG demonstrates sinus tachycardia.  No acute findings noted on head CT.  No acute fracture or traumatic subluxation on cervical spine CT.  Chest x-ray is concerning for pneumonia.  Plain films of the right knee, lumbar spine, and pelvis are negative for acute displaced fractures.  Chemistry panel notable for BUN 35, creatinine 1.68, and bilirubin 2.0.  CK is elevated to 641.  WBC is 11.3.  Lactic acid is normal.  Blood and urine cultures were collected in the ED, 1 L of saline was administered, and the patient was started on Rocephin and azithromycin.  Review of Systems:  All other systems reviewed and apart from HPI, are negative.  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   Colon polyp    Coronary artery disease    Cutaneous sarcoidosis     Diabetes mellitus without complication (HCC)    Pt takes insulin   Diabetic neuropathy (HCC)    GERD (gastroesophageal reflux disease)    Headache    migaines in the past   Hemorrhoid    History of diabetic neuropathy    Hypercalcemia    Hyperlipemia    Hypertension    IBS (irritable bowel syndrome)    Iron deficiency anemia    Myocardial infarction (HCC)    Pneumonia    Sleep apnea    NO CPAP    Past Surgical History:  Procedure Laterality Date   ABDOMINAL HYSTERECTOMY     arthroscopic rotator cuff repair     BACK SURGERY     CARDIAC CATHETERIZATION     CATARACT EXTRACTION W/PHACO Right 01/26/2022   Procedure: CATARACT EXTRACTION PHACO AND INTRAOCULAR LENS PLACEMENT (IOC) RIGHT DIABETIC;  Surgeon: Nevada Crane, MD;  Location: [facility];  Service: Ophthalmology;  Laterality: Right;  2.90 0:29.3   CATARACT EXTRACTION W/PHACO Left 02/09/2022   Procedure: CATARACT EXTRACTION PHACO AND INTRAOCULAR LENS PLACEMENT (IOC) LEFT DIABETIC 3.54 00:32.3;  Surgeon: Nevada Crane, MD;  Location: [facility];  Service: Ophthalmology;  Laterality: Left;  Diabetic   CHOLECYSTECTOMY     COLONOSCOPY     COLONOSCOPY WITH PROPOFOL N/A 11/29/2017   Procedure: COLONOSCOPY WITH PROPOFOL;  Surgeon: Christena Deem, MD;  Location: [facility];  Service: Endoscopy;  Laterality:  N/A;   CORONARY ANGIOPLASTY     PTCA and stent of RCA   CORONARY ARTERY BYPASS GRAFT     ESOPHAGOGASTRODUODENOSCOPY     ESOPHAGOGASTRODUODENOSCOPY (EGD) WITH PROPOFOL N/A 11/29/2017   Procedure: ESOPHAGOGASTRODUODENOSCOPY (EGD) WITH PROPOFOL;  Surgeon: Christena Deem, MD;  Location: [facility];  Service: Endoscopy;  Laterality: N/A;   ESOPHAGOGASTRODUODENOSCOPY (EGD) WITH PROPOFOL N/A 03/07/2018   Procedure: ESOPHAGOGASTRODUODENOSCOPY (EGD) WITH PROPOFOL;  Surgeon: Christena Deem, MD;  Location: [facility];  Service: Endoscopy;  Laterality: N/A;   ESOPHAGOGASTRODUODENOSCOPY  (EGD) WITH PROPOFOL N/A 08/16/2019   Procedure: ESOPHAGOGASTRODUODENOSCOPY (EGD) WITH PROPOFOL;  Surgeon: Toledo, Boykin Nearing, MD;  Location: [facility] ENDOSCOPY;  Service: Gastroenterology;  Laterality: N/A;   EXCISION OF ADNEXAL MASS     JOINT REPLACEMENT  04/2021   left total knee replacement   OOPHORECTOMY     TOTAL KNEE REVISION Left 05/07/2021   Procedure: TOTAL KNEE REVISION;  Surgeon: Donato Heinz, MD;  Location: [facility];  Service: Orthopedics;  Laterality: Left;   vesicular vaginal fistula      Social History:   reports that she quit smoking about 51 years ago. Her smoking use included cigarettes. She has never used smokeless tobacco. She reports that she does not currently use alcohol. She reports that she does not use drugs.  Allergies  Allergen Reactions   Levaquin [Levofloxacin In D5w] Shortness Of Breath   Penicillins Hives   Codeine Other (See Comments)    Hallucinate   Lipitor [Atorvastatin Calcium] Other (See Comments)    Muscle pain   Lopressor [Metoprolol Tartrate] Other (See Comments)    Heart races   Potassium-Containing Compounds Other (See Comments)     Facial flushing   Procardia [Nifedipine] Other (See Comments)    Heart races   Sucralfate Nausea Only   Tramadol Other (See Comments)    Confusion   Allegra [Fexofenadine] Rash   Naltrexone Other (See Comments)    Severe headache, nausea, "body flashes".    Sulfa Antibiotics Rash    Family History  Problem Relation Age of Onset   Hypertension Mother    Heart attack Mother    Hypertension Father    Heart attack Father    Skin cancer Father    Heart attack Sister    Breast cancer Neg Hx      Prior to Admission medications   Medication Sig Start Date End Date Taking? Authorizing Provider  acetaminophen (TYLENOL) 500 MG tablet Take 500-1,000 mg by mouth every 4 (four) hours as needed for mild pain or fever.   Yes [provider]  cholecalciferol (VITAMIN D3) 25 MCG (1000 UNIT) tablet Take  1,000 Units by mouth daily.   Yes [provider]  Cyanocobalamin (B-12) 2500 MCG TABS Take 2,500 mcg by mouth daily.   Yes [provider]  diltiazem (CARDIZEM CD) 120 MG 24 hr capsule Take 1 capsule (120 mg total) by mouth daily. 10/15/21  Yes Marrion Coy, MD  fenofibrate 160 MG tablet Take 160 mg by mouth daily.   Yes [provider]  Ferrous Sulfate (IRON) 325 (65 Fe) MG TABS Take 1 tablet by mouth daily.   Yes [provider]  furosemide (LASIX) 40 MG tablet Take 40 mg by mouth daily. 02/22/21  Yes [provider]  gabapentin (NEURONTIN) 100 MG capsule Take 1 capsule by mouth 3 (three) times daily. 02/19/22  Yes [provider]  hydrOXYzine (ATARAX) 25 MG tablet Take 25 mg by mouth daily.   Yes  [provider]  insulin NPH-regular Human (NOVOLIN 70/30) (70-30) 100 UNIT/ML injection Inject 20-25 Units into the skin 2 (two) times daily with a meal. 25 units with breakfast 20-25 units with supper   Yes [provider]  insulin regular (NOVOLIN R) 100 units/mL injection Inject 1-10 Units into the skin daily. lunchtime   Yes [provider]  lisinopril (ZESTRIL) 5 MG tablet Take 5 mg by mouth daily.   Yes [provider]  loratadine (CLARITIN) 10 MG tablet Take 10 mg by mouth daily.   Yes [provider]  magnesium oxide (MAG-OX) 400 (240 Mg) MG tablet Take 400 mg by mouth 2 (two) times daily. 03/13/21  Yes [provider]  Multiple Vitamin (MULTIVITAMIN WITH MINERALS) TABS tablet Take 1 tablet by mouth at bedtime.   Yes [provider]  ondansetron (ZOFRAN) 4 MG tablet Take 4 mg by mouth every 8 (eight) hours as needed for vomiting or nausea. 09/19/20  Yes [provider]  pantoprazole (PROTONIX) 40 MG tablet Take 40 mg by mouth 2 (two) times daily.   Yes [provider]  propranolol ER (INDERAL LA) 60 MG 24 hr capsule Take 1 capsule (60 mg total) by mouth daily.  10/14/21 10/14/22 Yes Marrion Coy, MD  senna (SENOKOT) 8.6 MG tablet Take 1 tablet by mouth daily.   Yes [provider]  sertraline (ZOLOFT) 100 MG tablet Take 100 mg by mouth daily.   Yes [provider]  spironolactone (ALDACTONE) 50 MG tablet Take 50 mg by mouth daily. 03/13/21  Yes [provider]  nitroGLYCERIN (NITROSTAT) 0.4 MG SL tablet Place 0.4 mg under the tongue every 5 (five) minutes as needed for chest pain. Patient not taking: Reported on 02/24/2022    [provider]    Physical Exam: Vitals:   09/13/22 1900 09/13/22 1930 09/13/22 2000 09/13/22 2030  BP: (!) 140/51 (!) 126/50 (!) 135/48   Pulse:  (!) 106 (!) 105   Resp: 13 (!) 23 (!) 26   Temp:    98.2 F (36.8 C)  TempSrc:    Oral  SpO2:  96% 99%      Constitutional: NAD, no pallor or diaphoresis, listless   Eyes: PERTLA, lids and conjunctivae normal ENMT: Mucous membranes are moist. Posterior pharynx clear of any exudate or lesions.   Neck: supple, no masses  Respiratory: no wheezing. No accessory muscle use.  Cardiovascular: S1 & S2 heard, regular rate and rhythm. No extremity edema.   Abdomen: No distension, no tenderness, soft. Bowel sounds active.  Musculoskeletal: no clubbing / cyanosis. No red/hot/swollen joint.   Skin: Scattered ecchymoses. Warm, dry, well-perfused. Neurologic: CN 2-12 grossly intact. Sensation to light touch intact. Moving all extremities. Alert and oriented.  Psychiatric: Calm. Cooperative.    Labs and Imaging on Admission: I have personally reviewed following labs and imaging studies  CBC: Recent Labs  Lab 09/13/22 1610  WBC 11.3*  NEUTROABS 10.2*  HGB 11.3*  HCT 35.8*  MCV 92.5  PLT 193   Basic Metabolic Panel: Recent Labs  Lab 09/13/22 1610  NA 135  K 4.7  CL 100  CO2 19*  GLUCOSE 159*  BUN 35*  CREATININE 1.68*  CALCIUM 9.7   GFR: CrCl cannot be calculated (Unknown ideal weight.). Liver Function Tests: Recent Labs  Lab  09/13/22 1610  AST 48*  ALT 22  ALKPHOS 73  BILITOT 2.0*  PROT 6.7  ALBUMIN 3.7   No results for input(s): "LIPASE", "AMYLASE" in the  last 168 hours. No results for input(s): "AMMONIA" in the last 168 hours. Coagulation Profile: No results for input(s): "INR", "PROTIME" in the last 168 hours. Cardiac Enzymes: Recent Labs  Lab 09/13/22 1610  CKTOTAL 641*   BNP (last 3 results) No results for input(s): "PROBNP" in the last 8760 hours. HbA1C: No results for input(s): "HGBA1C" in the last 72 hours. CBG: No results for input(s): "GLUCAP" in the last 168 hours. Lipid Profile: No results for input(s): "CHOL", "HDL", "LDLCALC", "TRIG", "CHOLHDL", "LDLDIRECT" in the last 72 hours. Thyroid Function Tests: No results for input(s): "TSH", "T4TOTAL", "FREET4", "T3FREE", "THYROIDAB" in the last 72 hours. Anemia Panel: No results for input(s): "VITAMINB12", "FOLATE", "FERRITIN", "TIBC", "IRON", "RETICCTPCT" in the last 72 hours. Urine analysis:    Component Value Date/Time   COLORURINE YELLOW (A) 09/13/2022 1610   APPEARANCEUR HAZY (A) 09/13/2022 1610   LABSPEC 1.014 09/13/2022 1610   PHURINE 5.0 09/13/2022 1610   GLUCOSEU NEGATIVE 09/13/2022 1610   HGBUR NEGATIVE 09/13/2022 1610   BILIRUBINUR NEGATIVE 09/13/2022 1610   KETONESUR 5 (A) 09/13/2022 1610   PROTEINUR NEGATIVE 09/13/2022 1610   NITRITE NEGATIVE 09/13/2022 1610   LEUKOCYTESUR SMALL (A) 09/13/2022 1610   Sepsis Labs: @LABRCNTIP (procalcitonin:4,lacticidven:4) )No results found for this or any previous visit (from the past 240 hour(s)).   Radiological Exams on Admission: DG Chest Portable 1 View  Result Date: 09/13/2022 CLINICAL DATA:  Weakness. EXAM: PORTABLE CHEST 1 VIEW COMPARISON:  10/13/2021. FINDINGS: Unchanged hyperexpanded lungs with prominent interstitial opacities, compatible with emphysema. Hazy opacities in the lingula obscure the left heart border, possibly reflecting aspiration or infection. No pleural  effusion or pneumothorax. IMPRESSION: Hazy opacities in the lingula, possibly reflecting aspiration or infection. Electronically Signed   By: Orvan Falconer M.D.   On: 09/13/2022 18:15   DG Pelvis 1-2 Views  Result Date: 09/13/2022 CLINICAL DATA:  Larey Seat, low back pain EXAM: PELVIS - 1-2 VIEW COMPARISON:  None Available. FINDINGS: Single frontal view of the pelvis includes both hips. The bones are diffusely osteopenic. No acute displaced fracture, subluxation, or dislocation. Symmetrical bilateral hip osteoarthritis. IMPRESSION: 1. Osteopenia and bilateral hip osteoarthritis. 2. No acute displaced fracture. Electronically Signed   By: Sharlet Salina M.D.   On: 09/13/2022 17:14   DG Knee Complete 4 Views Right  Result Date: 09/13/2022 CLINICAL DATA:  Right knee pain after fall EXAM: RIGHT KNEE - COMPLETE 4+ VIEW COMPARISON:  None Available. FINDINGS: Frontal, bilateral oblique, and cross-table lateral views of the right knee are obtained. No acute fracture, subluxation, or dislocation. There is 3 compartmental joint space narrowing. Diffuse chondrocalcinosis is identified, as well as calcifications along the joint capsule. Heterotopic ossification versus large enthesophyte is seen on the lower pole of the patella and along the course of the patellar tendon. No joint effusion. Soft tissues are otherwise unremarkable. IMPRESSION: 1. No acute displaced fracture. 2. 3 compartmental joint space narrowing, with chondrocalcinosis and diffuse capsular calcifications. 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 3. Large enthesophyte versus heterotopic ossification within the patellar tendon. Electronically Signed   By: Sharlet Salina M.D.   On: 09/13/2022 17:13   DG Lumbar Spine 2-3 Views  Result Date: 09/13/2022 CLINICAL DATA:  Low back pain after fall EXAM: LUMBAR SPINE - 2-3 VIEW COMPARISON:  11/17/2003 FINDINGS: Frontal and cross-table lateral views of the lumbar spine are obtained on 3 images. There are 5  non-rib-bearing lumbar type vertebral bodies identified, with mild right convex scoliosis centered at the L3 level. There is a  chronic compression deformity of the L3 vertebral body, with less than 50% loss of height. No evidence of acute fracture. There is diffuse lumbar spondylosis and facet hypertrophy, greatest from L3-4 through L5-S1. Sacroiliac joints are unremarkable. Symmetrical bilateral hip osteoarthritis. IMPRESSION: 1. Chronic L3 compression deformity with less than 50% loss of height. 2. No acute displaced fracture. 3. 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 Electronically Signed   By: Sharlet Salina M.D.   On: 09/13/2022 17:11   CT Head Wo Contrast  Result Date: 09/13/2022 CLINICAL DATA:  Head trauma, minor (Age >= 65y); Neck trauma (Age >= 65y). EXAM: CT HEAD WITHOUT CONTRAST CT CERVICAL SPINE WITHOUT CONTRAST TECHNIQUE: Multidetector CT imaging of the head and cervical spine was performed following the standard protocol without intravenous contrast. Multiplanar CT image reconstructions of the cervical spine were also generated. RADIATION DOSE REDUCTION: This exam was performed according to the departmental dose-optimization program which includes automated exposure control, adjustment of the mA and/or kV according to patient size and/or use of iterative reconstruction technique. COMPARISON:  CT head and cervical spine 07/07/2021. MRI brain 12/18/2021. FINDINGS: CT HEAD FINDINGS Brain: No acute intracranial hemorrhage. Gray-white differentiation is preserved. No hydrocephalus or extra-axial collection. Unchanged 11 mm meningioma in the right posterior fossa (axial image 7 series 2). No significant mass effect or midline shift. Vascular: No hyperdense vessel or unexpected calcification. Skull: No calvarial fracture or suspicious bone lesion. Skull base is unremarkable. Sinuses/Orbits: Unremarkable. Other: None. CT CERVICAL SPINE FINDINGS Alignment: Normal. Skull base and  vertebrae: No acute fracture. Intact craniocervical junction. Postoperative changes of prior C3-C7 corpectomy and ACDF. Intact hardware. No associated lucency. Solid bony fusion across the intervening levels. Soft tissues and spinal canal: No prevertebral fluid or swelling. No visible canal hematoma. Disc levels:  No high-grade spinal canal stenosis. Upper chest: Patchy ground-glass opacities in the lung apices, likely infectious or inflammatory. Other: None. IMPRESSION: 1. No acute intracranial abnormality. 2. No acute fracture or traumatic listhesis of the cervical spine. 3. Patchy ground-glass opacities in the lung apices, likely infectious or inflammatory. Electronically Signed   By: Orvan Falconer M.D.   On: 09/13/2022 16:45   CT Cervical Spine Wo Contrast  Result Date: 09/13/2022 CLINICAL DATA:  Head trauma, minor (Age >= 65y); Neck trauma (Age >= 65y). EXAM: CT HEAD WITHOUT CONTRAST CT CERVICAL SPINE WITHOUT CONTRAST TECHNIQUE: Multidetector CT imaging of the head and cervical spine was performed following the standard protocol without intravenous contrast. Multiplanar CT image reconstructions of the cervical spine were also generated. RADIATION DOSE REDUCTION: This exam was performed according to the departmental dose-optimization program which includes automated exposure control, adjustment of the mA and/or kV according to patient size and/or use of iterative reconstruction technique. COMPARISON:  CT head and cervical spine 07/07/2021. MRI brain 12/18/2021. FINDINGS: CT HEAD FINDINGS Brain: No acute intracranial hemorrhage. Gray-white differentiation is preserved. No hydrocephalus or extra-axial collection. Unchanged 11 mm meningioma in the right posterior fossa (axial image 7 series 2). No significant mass effect or midline shift. Vascular: No hyperdense vessel or unexpected calcification. Skull: No calvarial fracture or suspicious bone lesion. Skull base is unremarkable. Sinuses/Orbits: Unremarkable.  Other: None. CT CERVICAL SPINE FINDINGS Alignment: Normal. Skull base and vertebrae: No acute fracture. Intact craniocervical junction. Postoperative changes of prior C3-C7 corpectomy and ACDF. Intact hardware. No associated lucency. Solid bony fusion across the intervening levels. Soft tissues and spinal canal: No prevertebral fluid or swelling. No visible canal hematoma. Disc levels:  No high-grade spinal  canal stenosis. Upper chest: Patchy ground-glass opacities in the lung apices, likely infectious or inflammatory. Other: None. IMPRESSION: 1. No acute intracranial abnormality. 2. No acute fracture or traumatic listhesis of the cervical spine. 3. Patchy ground-glass opacities in the lung apices, likely infectious or inflammatory. Electronically Signed   By: Orvan Falconer M.D.   On: 09/13/2022 16:45    EKG: Independently reviewed. Sinus tachycardia, rate 103.   Assessment/Plan   1. Pneumonia  - Blood cultures collected in ED and Rocephin and azithromycin were started   - Check/trend procalcitonin, follow cultures and clinical course, continue Rocephin and azithromycin    2. Generalized weakness  - No acute findings on head CT and no focal deficit identified on exam  - Do not think this is from rhabdo (see below), more likely from pneumonia - Continue antibiotics and gentle IVF hydration, consult PT   3. Elevated CK  - CK is elevated to 641 on admission  - She has generalized aches but CK not as high as typically seen in rhabdo and there is no muscle tenderness, swelling, or "Hgb" on urine dipstick to support that diagnosis   4. Type II DM  - A1c was 6.3% in March 2024  - Check CBGs, continue insulin   5. Hypertension  - Continue diltiazem, propranolol, and lisinopril as tolerated   6. 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 CAD  - S/p CABG  - No anginal complaints  - Continue beta-blocker    8. CKD 3b  - SCr is 1.68 on admission, down from 1.80 last  month  - Renally-dose medications, monitor   9. OSA  - CPAP qHS     DVT prophylaxis: sq heparin  Code Status: Full  Level of Care: Level of care: Telemetry Medical Family Communication: none present  Disposition Plan:  Patient is from: Home  Anticipated d/c is to: TBD Anticipated d/c date is: 09/16/22 Patient currently: pending stable respiratory status, PT evaluation   Consults called: none  Admission status: Inpatient     Briscoe Deutscher, MD [facility]  09/13/2022, 9:32 PM

## 2022-09-13 NOTE — ED Triage Notes (Signed)
Pt presents via EMS c/o head pain. Reports fell last night and was in floor all night until able to slide to phone to call EMS. C/o back, knee, and head pain. Reports hit head.

## 2022-09-13 NOTE — ED Notes (Signed)
Pt given PO water, tolerated without complications.

## 2022-09-14 ENCOUNTER — Inpatient Hospital Stay: Payer: Medicare Other | Attending: Oncology

## 2022-09-14 DIAGNOSIS — J189 Pneumonia, unspecified organism: Secondary | ICD-10-CM | POA: Diagnosis not present

## 2022-09-14 LAB — CBC
HCT: 30.9 % — ABNORMAL LOW (ref 36.0–46.0)
Hemoglobin: 9.6 g/dL — ABNORMAL LOW (ref 12.0–15.0)
MCH: 29.1 pg (ref 26.0–34.0)
MCHC: 31.1 g/dL (ref 30.0–36.0)
MCV: 93.6 fL (ref 80.0–100.0)
Platelets: 184 10*3/uL (ref 150–400)
RBC: 3.3 MIL/uL — ABNORMAL LOW (ref 3.87–5.11)
RDW: 14.4 % (ref 11.5–15.5)
WBC: 9.4 10*3/uL (ref 4.0–10.5)
nRBC: 0 % (ref 0.0–0.2)

## 2022-09-14 LAB — COMPREHENSIVE METABOLIC PANEL
ALT: 19 U/L (ref 0–44)
AST: 34 U/L (ref 15–41)
Albumin: 3 g/dL — ABNORMAL LOW (ref 3.5–5.0)
Alkaline Phosphatase: 59 U/L (ref 38–126)
Anion gap: 17 — ABNORMAL HIGH (ref 5–15)
BUN: 34 mg/dL — ABNORMAL HIGH (ref 8–23)
CO2: 16 mmol/L — ABNORMAL LOW (ref 22–32)
Calcium: 8.8 mg/dL — ABNORMAL LOW (ref 8.9–10.3)
Chloride: 105 mmol/L (ref 98–111)
Creatinine, Ser: 1.41 mg/dL — ABNORMAL HIGH (ref 0.44–1.00)
GFR, Estimated: 38 mL/min — ABNORMAL LOW (ref >60)
Glucose, Bld: 161 mg/dL — ABNORMAL HIGH (ref 70–99)
Potassium: 4.4 mmol/L (ref 3.5–5.1)
Sodium: 138 mmol/L (ref 135–145)
Total Bilirubin: 1.6 mg/dL — ABNORMAL HIGH (ref 0.3–1.2)
Total Protein: 5.8 g/dL — ABNORMAL LOW (ref 6.5–8.1)

## 2022-09-14 LAB — CULTURE, BLOOD (ROUTINE X 2)

## 2022-09-14 LAB — SARS CORONAVIRUS 2 BY RT PCR: SARS Coronavirus 2 by RT PCR: NEGATIVE

## 2022-09-14 LAB — GLUCOSE, CAPILLARY
Glucose-Capillary: 158 mg/dL — ABNORMAL HIGH (ref 70–99)
Glucose-Capillary: 208 mg/dL — ABNORMAL HIGH (ref 70–99)
Glucose-Capillary: 206 mg/dL — ABNORMAL HIGH (ref 70–99)
Glucose-Capillary: 199 mg/dL — ABNORMAL HIGH (ref 70–99)

## 2022-09-14 LAB — PROCALCITONIN: Procalcitonin: 0.13 ng/mL

## 2022-09-14 MED ORDER — ACETAMINOPHEN 325 MG PO TABS
650.0000 mg | ORAL_TABLET | Freq: Four times a day (QID) | ORAL | Status: DC | PRN
  Administered 2022-09-22: 650 mg via ORAL
  Filled 2022-09-14: qty 2

## 2022-09-14 MED ORDER — ACETAMINOPHEN 650 MG RE SUPP: 650.0000 mg | Freq: Four times a day (QID) | RECTAL | Status: DC | PRN

## 2022-09-14 MED ORDER — HYDROCODONE-ACETAMINOPHEN 10-325 MG PO TABS
1.0000 | ORAL_TABLET | ORAL | Status: DC | PRN
  Administered 2022-09-15 – 2022-09-22 (×12): 1 via ORAL
  Filled 2022-09-14 (×12): qty 1

## 2022-09-14 NOTE — Progress Notes (Addendum)
PROGRESS NOTE    Rhonda Tyler  ZOX:[id number]  DOB: May 25, 1940  DOA: 09/13/2022 PCP: Lauro Regulus, MD Outpatient Specialists:   Hospital course:  82 yo female with HTN, DM2, CKD 3B, cirrhosis secondary to NASH and CAD was admitted for generalized fatigue, malaise, myalgias, decreased p.o. intake and DOE with subsequent mechanical fall.  Workup was essentially unrevealing however chest x-ray showed possible haziness at lingula and patient was admitted for CAP.   Subjective:  Patient states she still feels very tired.  Notes she has no appetite at all.  Notes that food taste strange and her sense of smell is also gone.  Admits to intermittent cough and some DOE.  Does not feel too much better than yesterday.   Objective: Vitals:   09/13/22 2316 09/14/22 0312 09/14/22 0635 09/14/22 1033  BP: (!) 130/52 99/80 (!) 136/51 (!) 119/47  Pulse: (!) 101 100 98 88  Resp: 20 20 16 17   Temp: 97.6 F (36.4 C) 97.9 F (36.6 C) 98 F (36.7 C) 98.2 F (36.8 C)  TempSrc: Oral Oral Oral   SpO2: 100% 96% 95% 95%  Weight:      Height:        Intake/Output Summary (Last 24 hours) at 09/14/2022 1515 Last data filed at 09/14/2022 0400 Gross per 24 hour  Intake 948.33 ml  Output --  Net 948.33 ml   Filed Weights   09/13/22 2310  Weight: 75.3 kg     Exam:  General: Tired appearing female lying listlessly in bed and NARD Eyes: sclera anicteric, conjuctiva mild injection bilaterally CVS: S1-S2, regular  Respiratory:  decreased air entry bilaterally secondary to decreased inspiratory effort, rales at bases  GI: NABS, soft, NT  LE: No edema Neuro: A/O x 3   Data Reviewed:  Basic Metabolic Panel: Recent Labs  Lab 09/13/22 1610 09/14/22 0604  NA 135 138  K 4.7 4.4  CL 100 105  CO2 19* 16*  GLUCOSE 159* 161*  BUN 35* 34*  CREATININE 1.68* 1.41*  CALCIUM 9.7 8.8*    CBC: Recent Labs  Lab 09/13/22 1610 09/14/22 0604  WBC 11.3* 9.4  NEUTROABS 10.2*  --   HGB  11.3* 9.6*  HCT 35.8* 30.9*  MCV 92.5 93.6  PLT 193 184     Scheduled Meds:  azithromycin  500 mg Oral Daily   diltiazem  120 mg Oral Daily   heparin  5,000 Units Subcutaneous Q8H   insulin aspart  0-5 Units Subcutaneous QHS   insulin aspart  0-9 Units Subcutaneous TID WC   insulin glargine-yfgn  14 Units Subcutaneous BID   lisinopril  5 mg Oral Daily   pantoprazole  40 mg Oral BID   propranolol ER  60 mg Oral Daily   sertraline  100 mg Oral Daily   sodium chloride flush  3 mL Intravenous Q12H   Continuous Infusions:  sodium chloride 75 mL/hr at 09/14/22 1407   cefTRIAXone (ROCEPHIN)  IV       Assessment & Plan:   Malaise/myalgias/asthenia Admission for CAP Patient with asthenic symptoms with loss of taste and smell I have concern for COVID-19 infection--will order COVID-19 swab/airborne precautions--NB COVID is negative In the meanwhile we will continue ceftriaxone and azithromycin day #2 Procalcitonin is 0.13 Can consider Noncon chest CT given the groundglass opacities seen only at the apices on the head CT yesterday  Metabolic acidosis Anion gap of 17 Lactic acid yesterday was 1.2 Patient denies any ingestions or diarrhea Possibly secondary  to CKD Consider starting oral bicarb if not improved with treatment of infection  Mechanical fall Elevated CPK Patient states she tripped and fell in the dark yesterday Denies hitting head or LOC Does have pain in her back and hip area Plain films are negative Patient was treated with hydration for mild elevation of CPK, will repeat CPK  Cirrhosis secondary to NASH As noted above, will check ammonia but no asterixis Diuretics being held given concern for intravascular fluid depletion  CKD 3B Creatinine around baseline, somewhat improved with hydration yesterday  HTN BP under good control on diltiazem, propranolol and lisinopril  DM 2 Blood sugars under reasonable control on present regimen     DVT prophylaxis:  Subcu heparin Code Status: Full Family Communication: spoke with patient's caregiver Geraldine Contras per patient request [phone]     Studies: DG Chest Portable 1 View  Result Date: 09/13/2022 CLINICAL DATA:  Weakness. EXAM: PORTABLE CHEST 1 VIEW COMPARISON:  10/13/2021. FINDINGS: Unchanged hyperexpanded lungs with prominent interstitial opacities, compatible with emphysema. Hazy opacities in the lingula obscure the left heart border, possibly reflecting aspiration or infection. No pleural effusion or pneumothorax. IMPRESSION: Hazy opacities in the lingula, possibly reflecting aspiration or infection. Electronically Signed   By: Orvan Falconer M.D.   On: 09/13/2022 18:15   DG Pelvis 1-2 Views  Result Date: 09/13/2022 CLINICAL DATA:  Larey Seat, low back pain EXAM: PELVIS - 1-2 VIEW COMPARISON:  None Available. FINDINGS: Single frontal view of the pelvis includes both hips. The bones are diffusely osteopenic. No acute displaced fracture, subluxation, or dislocation. Symmetrical bilateral hip osteoarthritis. IMPRESSION: 1. Osteopenia and bilateral hip osteoarthritis. 2. No acute displaced fracture. Electronically Signed   By: Sharlet Salina M.D.   On: 09/13/2022 17:14   DG Knee Complete 4 Views Right  Result Date: 09/13/2022 CLINICAL DATA:  Right knee pain after fall EXAM: RIGHT KNEE - COMPLETE 4+ VIEW COMPARISON:  None Available. FINDINGS: Frontal, bilateral oblique, and cross-table lateral views of the right knee are obtained. No acute fracture, subluxation, or dislocation. There is 3 compartmental joint space narrowing. Diffuse chondrocalcinosis is identified, as well as calcifications along the joint capsule. Heterotopic ossification versus large enthesophyte is seen on the lower pole of the patella and along the course of the patellar tendon. No joint effusion. Soft tissues are otherwise unremarkable. IMPRESSION: 1. No acute displaced fracture. 2. 3 compartmental joint space narrowing, with chondrocalcinosis  and diffuse capsular calcifications. 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 3. Large enthesophyte versus heterotopic ossification within the patellar tendon. Electronically Signed   By: Sharlet Salina M.D.   On: 09/13/2022 17:13   DG Lumbar Spine 2-3 Views  Result Date: 09/13/2022 CLINICAL DATA:  Low back pain after fall EXAM: LUMBAR SPINE - 2-3 VIEW COMPARISON:  11/17/2003 FINDINGS: Frontal and cross-table lateral views of the lumbar spine are obtained on 3 images. There are 5 non-rib-bearing lumbar type vertebral bodies identified, with mild right convex scoliosis centered at the L3 level. There is a chronic compression deformity of the L3 vertebral body, with less than 50% loss of height. No evidence of acute fracture. There is diffuse lumbar spondylosis and facet hypertrophy, greatest from L3-4 through L5-S1. Sacroiliac joints are unremarkable. Symmetrical bilateral hip osteoarthritis. IMPRESSION: 1. Chronic L3 compression deformity with less than 50% loss of height. 2. No acute displaced fracture. 3. 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 Electronically Signed   By: Sharlet Salina M.D.   On: 09/13/2022 17:11   CT Head  Wo Contrast  Result Date: 09/13/2022 CLINICAL DATA:  Head trauma, minor (Age >= 65y); Neck trauma (Age >= 65y). EXAM: CT HEAD WITHOUT CONTRAST CT CERVICAL SPINE WITHOUT CONTRAST TECHNIQUE: Multidetector CT imaging of the head and cervical spine was performed following the standard protocol without intravenous contrast. Multiplanar CT image reconstructions of the cervical spine were also generated. RADIATION DOSE REDUCTION: This exam was performed according to the departmental dose-optimization program which includes automated exposure control, adjustment of the mA and/or kV according to patient size and/or use of iterative reconstruction technique. COMPARISON:  CT head and cervical spine 07/07/2021. MRI brain 12/18/2021. FINDINGS: CT HEAD FINDINGS  Brain: No acute intracranial hemorrhage. Gray-white differentiation is preserved. No hydrocephalus or extra-axial collection. Unchanged 11 mm meningioma in the right posterior fossa (axial image 7 series 2). No significant mass effect or midline shift. Vascular: No hyperdense vessel or unexpected calcification. Skull: No calvarial fracture or suspicious bone lesion. Skull base is unremarkable. Sinuses/Orbits: Unremarkable. Other: None. CT CERVICAL SPINE FINDINGS Alignment: Normal. Skull base and vertebrae: No acute fracture. Intact craniocervical junction. Postoperative changes of prior C3-C7 corpectomy and ACDF. Intact hardware. No associated lucency. Solid bony fusion across the intervening levels. Soft tissues and spinal canal: No prevertebral fluid or swelling. No visible canal hematoma. Disc levels:  No high-grade spinal canal stenosis. Upper chest: Patchy ground-glass opacities in the lung apices, likely infectious or inflammatory. Other: None. IMPRESSION: 1. No acute intracranial abnormality. 2. No acute fracture or traumatic listhesis of the cervical spine. 3. Patchy ground-glass opacities in the lung apices, likely infectious or inflammatory. Electronically Signed   By: Orvan Falconer M.D.   On: 09/13/2022 16:45   CT Cervical Spine Wo Contrast  Result Date: 09/13/2022 CLINICAL DATA:  Head trauma, minor (Age >= 65y); Neck trauma (Age >= 65y). EXAM: CT HEAD WITHOUT CONTRAST CT CERVICAL SPINE WITHOUT CONTRAST TECHNIQUE: Multidetector CT imaging of the head and cervical spine was performed following the standard protocol without intravenous contrast. Multiplanar CT image reconstructions of the cervical spine were also generated. RADIATION DOSE REDUCTION: This exam was performed according to the departmental dose-optimization program which includes automated exposure control, adjustment of the mA and/or kV according to patient size and/or use of iterative reconstruction technique. COMPARISON:  CT head and  cervical spine 07/07/2021. MRI brain 12/18/2021. FINDINGS: CT HEAD FINDINGS Brain: No acute intracranial hemorrhage. Gray-white differentiation is preserved. No hydrocephalus or extra-axial collection. Unchanged 11 mm meningioma in the right posterior fossa (axial image 7 series 2). No significant mass effect or midline shift. Vascular: No hyperdense vessel or unexpected calcification. Skull: No calvarial fracture or suspicious bone lesion. Skull base is unremarkable. Sinuses/Orbits: Unremarkable. Other: None. CT CERVICAL SPINE FINDINGS Alignment: Normal. Skull base and vertebrae: No acute fracture. Intact craniocervical junction. Postoperative changes of prior C3-C7 corpectomy and ACDF. Intact hardware. No associated lucency. Solid bony fusion across the intervening levels. Soft tissues and spinal canal: No prevertebral fluid or swelling. No visible canal hematoma. Disc levels:  No high-grade spinal canal stenosis. Upper chest: Patchy ground-glass opacities in the lung apices, likely infectious or inflammatory. Other: None. IMPRESSION: 1. No acute intracranial abnormality. 2. No acute fracture or traumatic listhesis of the cervical spine. 3. Patchy ground-glass opacities in the lung apices, likely infectious or inflammatory. Electronically Signed   By: Orvan Falconer M.D.   On: 09/13/2022 16:45    Principal Problem:   Pneumonia Active Problems:   CAD (coronary artery disease), autologous vein bypass graft   Essential hypertension   Liver  cirrhosis secondary to NASH (nonalcoholic steatohepatitis) (HCC)   OSA on CPAP   Type 2 diabetes mellitus with diabetic chronic kidney disease (HCC)   Chronic kidney disease, stage 3b (HCC)   Elevated CK   Generalized weakness     Rhonda Tyler, [facility]  If 7PM-7AM, please contact night-coverage [email or web address]   LOS: 1 day

## 2022-09-14 NOTE — Progress Notes (Signed)
   09/14/22 1000  Spiritual Encounters  Type of Visit Initial  Care provided to: Patient  Conversation partners present during encounter Nurse  Referral source Patient request  Reason for visit Urgent spiritual support  OnCall Visit No  Spiritual Framework  Presenting Themes Community and relationships  Family Stress Factors Not reviewed  Interventions  Spiritual Care Interventions Made Established relationship of care and support;Compassionate presence;Prayer  Spiritual Care Plan  Spiritual Care Issues Still Outstanding Chaplain will continue to follow   Prayer for patient in the present of her caregiver. Advise patient that a Lunette Stands is here 24/7 if she needs someone to talk to or needed Pastoral Care needs.

## 2022-09-14 NOTE — Evaluation (Signed)
Physical Therapy Evaluation Patient Details Name: Rhonda Tyler MRN: [id number] DOB: Jun 07, 1940 Today's Date: 09/14/2022  History of Present Illness  Pt is a 82 y.o. female presenting to hospital 09/13/22 s/p fall at home; unable to get up off the floor; c/o pain in mid upper back, B knees, and head.  Recent fatigue, generalized weakness, aches, SOB, and cough.  Pt admitted with PNA, generalized weakness, and elevated CK.  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  Clinical Impression  Prior to recent medical concerns, pt was modified independent ambulating with RW; lives alone in 1 level home with 1 small step to enter; has companion that comes 2-3x/week for 6 hours each time to assist as needed; also reports neighbors able to assist some too.  Currently pt is SBA semi-supine to sitting edge of bed; CGA with transfers using RW; and CGA to ambulate 5 feet with RW (limited distance d/t SOB; pt's O2 sats 92% or greater on room air during sessions activities).  Pt c/o 8/10 low back and L knee pain during session (nurse notified).  Pt would currently benefit from skilled PT to address noted impairments and functional limitations (see below for any additional details).  Upon hospital discharge, pt would benefit from ongoing therapy.    Recommendations for follow up therapy are one component of a multi-disciplinary discharge planning process, led by the attending physician.  Recommendations may be updated based on patient status, additional functional criteria and insurance authorization.        Assistance Recommended at Discharge Intermittent Supervision/Assistance  Patient can return home with the following  A little help with walking and/or transfers;A little help with bathing/dressing/bathroom;Assistance with cooking/housework;Assist for transportation;Help with stairs or ramp for entrance    Equipment Recommendations Rolling walker (2  wheels);BSC/3in1;Other (comment) (pt has RW and BSC at home already)  Recommendations for Other Services  OT consult    Functional Status Assessment Patient has had a recent decline in their functional status and demonstrates the ability to make significant improvements in function in a reasonable and predictable amount of time.     Precautions / Restrictions Precautions Precautions: Fall Restrictions Weight Bearing Restrictions: No      Mobility  Bed Mobility Overal bed mobility: Needs Assistance Bed Mobility: Supine to Sit     Supine to sit: Supervision, HOB elevated     General bed mobility comments: mild increased effort to perform on own    Transfers Overall transfer level: Needs assistance Equipment used: Rolling walker (2 wheels) Transfers: Sit to/from Stand Sit to Stand: Min guard           General transfer comment: vc's for UE placement; mild increased effort to stand up to RW but steady    Ambulation/Gait Ambulation/Gait assistance: Min guard Gait Distance (Feet): 5 Feet (bed to recliner) Assistive device: Rolling walker (2 wheels)   Gait velocity: decreased     General Gait Details: partial step through gait pattern; limited d/t SOB  Stairs            Wheelchair Mobility    Modified Rankin (Stroke Patients Only)       Balance Overall balance assessment: Needs assistance Sitting-balance support: No upper extremity supported, Feet supported Sitting balance-Leahy Scale: Good Sitting balance - Comments: steady sitting reaching within BOS   Standing balance support: Bilateral upper extremity supported, During functional activity, Reliant on assistive device for balance Standing balance-Leahy Scale: Good Standing balance comment:  steady walking short distance with RW use                             Pertinent Vitals/Pain Pain Assessment Pain Assessment: 0-10 Pain Score: 8  Pain Location: low back and L knee Pain  Descriptors / Indicators:  (L knee burning; low back sore) Pain Intervention(s): Limited activity within patient's tolerance, Monitored during session, Premedicated before session, Repositioned HR WFL during sessions activities.    Home Living Family/patient expects to be discharged to:: Private residence Living Arrangements: Alone Available Help at Discharge: Neighbor;Personal care attendant;Available PRN/intermittently Type of Home: House Home Access: Stairs to enter Entrance Stairs-Rails: None Entrance Stairs-Number of Steps: 1   Home Layout: One level Home Equipment: Agricultural consultant (2 wheels);Cane - single point;BSC/3in1;Toilet riser;Grab bars - tub/shower;Shower seat      Prior Function Prior Level of Function : Independent/Modified Independent             Mobility Comments: Modified independent ambulating with RW.  4 falls in past year. ADLs Comments: Assist for errands.  Has "companion" 2-3x/week for 6 hours each time to assist as needed.  Neighbors also able to assist some.     Hand Dominance   Dominant Hand: Right    Extremity/Trunk Assessment   Upper Extremity Assessment Upper Extremity Assessment: Overall WFL for tasks assessed    Lower Extremity Assessment Lower Extremity Assessment: Generalized weakness       Communication   Communication: No difficulties  Cognition Arousal/Alertness: Awake/alert Behavior During Therapy: WFL for tasks assessed/performed Overall Cognitive Status: Within Functional Limits for tasks assessed                                          General Comments  Nursing cleared pt for participation in physical therapy.  Pt agreeable to PT session.  Pt's caregiver present part of session.    Exercises     Assessment/Plan    PT Assessment Patient needs continued PT services  PT Problem List Decreased strength;Decreased activity tolerance;Decreased balance;Decreased mobility;Cardiopulmonary status limiting  activity;Pain       PT Treatment Interventions DME instruction;Gait training;Stair training;Functional mobility training;Therapeutic activities;Therapeutic exercise;Balance training;Patient/family education    PT Goals (Current goals can be found in the Care Plan section)  Acute Rehab PT Goals Patient Stated Goal: to improve strength, breathing, and mobility PT Goal Formulation: With patient Time For Goal Achievement: 09/28/22 Potential to Achieve Goals: Good    Frequency Min 3X/week     Co-evaluation               AM-PAC PT "6 Clicks" Mobility  Outcome Measure Help needed turning from your back to your side while in a flat bed without using bedrails?: None Help needed moving from lying on your back to sitting on the side of a flat bed without using bedrails?: A Little Help needed moving to and from a bed to a chair (including a wheelchair)?: A Little Help needed standing up from a chair using your arms (e.g., wheelchair or bedside chair)?: A Little Help needed to walk in hospital room?: A Little Help needed climbing 3-5 steps with a railing? : A Little 6 Click Score: 19    End of Session Equipment Utilized During Treatment: Gait belt Activity Tolerance: Other (comment) (limited d/t SOB) Patient left: in chair;with call bell/phone within reach;with chair  alarm set;with family/visitor present Nurse Communication: Mobility status;Precautions;Other (comment) (pt's HR and O2 sats (and SOB) during session) PT Visit Diagnosis: Other abnormalities of gait and mobility (R26.89);Muscle weakness (generalized) (M62.81);History of falling (Z91.81);Pain Pain - part of body:  (low back; L knee)    Time: 1610-9604 PT Time Calculation (min) (ACUTE ONLY): 26 min   Charges:   PT Evaluation $PT Eval Low Complexity: 1 Low PT Treatments $Therapeutic Activity: 8-22 mins       Hendricks Limes, PT 09/14/22, 12:38 PM

## 2022-09-15 DIAGNOSIS — J189 Pneumonia, unspecified organism: Secondary | ICD-10-CM | POA: Diagnosis not present

## 2022-09-15 LAB — COMPREHENSIVE METABOLIC PANEL
ALT: 21 U/L (ref 0–44)
AST: 28 U/L (ref 15–41)
Albumin: 2.9 g/dL — ABNORMAL LOW (ref 3.5–5.0)
Alkaline Phosphatase: 55 U/L (ref 38–126)
Anion gap: 5 (ref 5–15)
BUN: 44 mg/dL — ABNORMAL HIGH (ref 8–23)
CO2: 24 mmol/L (ref 22–32)
Calcium: 8.7 mg/dL — ABNORMAL LOW (ref 8.9–10.3)
Chloride: 104 mmol/L (ref 98–111)
Creatinine, Ser: 1.26 mg/dL — ABNORMAL HIGH (ref 0.44–1.00)
GFR, Estimated: 43 mL/min — ABNORMAL LOW (ref >60)
Glucose, Bld: 188 mg/dL — ABNORMAL HIGH (ref 70–99)
Potassium: 4.3 mmol/L (ref 3.5–5.1)
Sodium: 133 mmol/L — ABNORMAL LOW (ref 135–145)
Total Bilirubin: 0.5 mg/dL (ref 0.3–1.2)
Total Protein: 5.3 g/dL — ABNORMAL LOW (ref 6.5–8.1)

## 2022-09-15 LAB — URINE CULTURE: Culture: >=100000 — AB

## 2022-09-15 LAB — CBC
HCT: 27.5 % — ABNORMAL LOW (ref 36.0–46.0)
Hemoglobin: 8.6 g/dL — ABNORMAL LOW (ref 12.0–15.0)
MCH: 28.8 pg (ref 26.0–34.0)
MCHC: 31.3 g/dL (ref 30.0–36.0)
MCV: 92 fL (ref 80.0–100.0)
Platelets: 170 10*3/uL (ref 150–400)
RBC: 2.99 MIL/uL — ABNORMAL LOW (ref 3.87–5.11)
RDW: 14.1 % (ref 11.5–15.5)
WBC: 7.5 10*3/uL (ref 4.0–10.5)
nRBC: 0 % (ref 0.0–0.2)

## 2022-09-15 LAB — GLUCOSE, CAPILLARY
Glucose-Capillary: 167 mg/dL — ABNORMAL HIGH (ref 70–99)
Glucose-Capillary: 172 mg/dL — ABNORMAL HIGH (ref 70–99)
Glucose-Capillary: 137 mg/dL — ABNORMAL HIGH (ref 70–99)
Glucose-Capillary: 171 mg/dL — ABNORMAL HIGH (ref 70–99)

## 2022-09-15 LAB — CULTURE, BLOOD (ROUTINE X 2)

## 2022-09-15 LAB — AMMONIA: Ammonia: 24 umol/L (ref 9–35)

## 2022-09-15 LAB — CK: Total CK: 104 U/L (ref 38–234)

## 2022-09-15 MED ORDER — SODIUM CHLORIDE 0.9 % IV SOLN
8.0000 mg | Freq: Four times a day (QID) | INTRAVENOUS | Status: DC | PRN
  Filled 2022-09-15: qty 4

## 2022-09-15 MED ORDER — STERILE WATER FOR INJECTION IJ SOLN
INTRAMUSCULAR | Status: AC
  Administered 2022-09-15: 10 mL
  Filled 2022-09-15: qty 10

## 2022-09-15 MED ORDER — ONDANSETRON HCL 4 MG PO TABS
8.0000 mg | ORAL_TABLET | Freq: Four times a day (QID) | ORAL | Status: DC | PRN
  Administered 2022-09-17 – 2022-09-22 (×6): 8 mg via ORAL
  Filled 2022-09-15 (×6): qty 2

## 2022-09-15 MED ORDER — PANTOPRAZOLE SODIUM 40 MG IV SOLR
40.0000 mg | Freq: Two times a day (BID) | INTRAVENOUS | Status: DC
  Administered 2022-09-15 – 2022-09-22 (×14): 40 mg via INTRAVENOUS
  Filled 2022-09-15 (×16): qty 10

## 2022-09-15 MED ORDER — BISACODYL 10 MG RE SUPP
10.0000 mg | Freq: Once | RECTAL | Status: AC
  Administered 2022-09-15: 10 mg via RECTAL
  Filled 2022-09-15: qty 1

## 2022-09-15 MED ORDER — ALUM & MAG HYDROXIDE-SIMETH 200-200-20 MG/5ML PO SUSP
30.0000 mL | ORAL | Status: DC | PRN
  Administered 2022-09-15 (×2): 30 mL via ORAL
  Filled 2022-09-15 (×2): qty 30

## 2022-09-15 NOTE — Progress Notes (Signed)
PROGRESS NOTE    Rhonda Tyler  ZOX:[id number]  DOB: 1941-05-06  DOA: 09/13/2022 PCP: Lauro Regulus, MD Outpatient Specialists:   Hospital course:  82 yo female with HTN, DM2, CKD 3B, cirrhosis secondary to NASH and CAD was admitted for generalized fatigue, malaise, myalgias, decreased p.o. intake and DOE with subsequent mechanical fall.  Workup was essentially unrevealing however chest x-ray showed possible haziness at lingula and patient was admitted for CAP.   Subjective:  Patient is quite uncomfortable today.  Notes that she is having burning pain in her chest and nausea that is not improved with Zofran.  Notes that she sometimes gets "bad spells but not as bad as this" that she treats with Maalox and pantoprazole.  Patient denies vomiting but notes she is very nauseated.  Patient denies history of PUD or GI bleed.  Denies history of melena or hematemesis.   Objective: Vitals:   09/14/22 1920 09/15/22 0424 09/15/22 0700 09/15/22 1240  BP: (!) 145/56 (!) 127/50 (!) 109/45 131/62  Pulse: 76 81 80 71  Resp: 17 17 18 17   Temp: 98.4 F (36.9 C) 98.4 F (36.9 C) 98.5 F (36.9 C)   TempSrc: Oral Oral Oral   SpO2: 93% 94% 93%   Weight:  75.5 kg    Height:        Intake/Output Summary (Last 24 hours) at 09/15/2022 1616 Last data filed at 09/15/2022 1010 Gross per 24 hour  Intake 240 ml  Output --  Net 240 ml    Filed Weights   09/13/22 2310 09/15/22 0424  Weight: 75.3 kg 75.5 kg     Exam:  General: Patient sitting up on side of bed appearing uncomfortable burping a lot Eyes: sclera anicteric, conjuctiva mild injection bilaterally CVS: S1-S2, regular  Respiratory:  decreased air entry bilaterally secondary to decreased inspiratory effort, rales at bases  GI: NABS, soft, mild tenderness to palpation in epigastric area, no rebound tenderness. LE: No edema Neuro: A/O x 3   Data Reviewed:  Basic Metabolic Panel: Recent Labs  Lab 09/13/22 1610  09/14/22 0604 09/15/22 0505  NA 135 138 133*  K 4.7 4.4 4.3  CL 100 105 104  CO2 19* 16* 24  GLUCOSE 159* 161* 188*  BUN 35* 34* 44*  CREATININE 1.68* 1.41* 1.26*  CALCIUM 9.7 8.8* 8.7*     CBC: Recent Labs  Lab 09/13/22 1610 09/14/22 0604 09/15/22 0505  WBC 11.3* 9.4 7.5  NEUTROABS 10.2*  --   --   HGB 11.3* 9.6* 8.6*  HCT 35.8* 30.9* 27.5*  MCV 92.5 93.6 92.0  PLT 193 184 170      Scheduled Meds:  azithromycin  500 mg Oral Daily   diltiazem  120 mg Oral Daily   heparin  5,000 Units Subcutaneous Q8H   insulin aspart  0-5 Units Subcutaneous QHS   insulin aspart  0-9 Units Subcutaneous TID WC   insulin glargine-yfgn  14 Units Subcutaneous BID   lisinopril  5 mg Oral Daily   propranolol ER  60 mg Oral Daily   sertraline  100 mg Oral Daily   sodium chloride flush  3 mL Intravenous Q12H   Continuous Infusions:  cefTRIAXone (ROCEPHIN)  IV 2 g (09/15/22 0343)   ondansetron (ZOFRAN) IV       Assessment & Plan:   GERD Decreasing H&H Constipation Patient states that she has had "spells like this at home but not so bad". Denies history of PUD, melena or hematemesis Will change  oral pantoprazole to pantoprazole IV every 12 hours As needed Maalox, continue Zofran Hemoglobin has decreased from 11.3 to 9.6 to 8.6 with hydration, but patient remains hemodynamically stable without any tachycardia. Patient is constipated so does not know if she has melena, Dulcolax suppository ordered If patient's symptoms or not improved by tomorrow and or if H&H continues to decrease would consider GI consult in the morning.  Malaise/myalgias/asthenia Admission for CAP Continue ceftriaxone and azithromycin day #3 Procalcitonin is 0.13 Can consider Noncon chest CT given the groundglass opacities seen only at the apices on the head CT and patient without significant oxygen deficit  Metabolic acidosis Acidosis is completely resolved overnight Bicarb has gone from 16 up to 24  today Anion gap has decreased from 17 to 5 Kidney function is modestly improved  Mechanical fall Elevated CPK Patient states she tripped and fell in the dark yesterday Denies hitting head or LOC Does have pain in her back and hip area Plain films are negative CPK elevation resolved, last check at 104  Cirrhosis secondary to NASH Patient on propranolol likely for esophageal varices Diuretics being held given concern for intravascular fluid depletion, can consider restarting  CKD 3B Creatinine around baseline, continues to improve with IVF  HTN BP under good control on diltiazem, propranolol and lisinopril  DM 2 Blood sugars under reasonable control on present regimen     DVT prophylaxis: Subcu heparin Code Status: Full Family Communication: spoke with patient's caregiver Geraldine Contras per patient request [phone]     Studies: DG Chest Portable 1 View  Result Date: 09/13/2022 CLINICAL DATA:  Weakness. EXAM: PORTABLE CHEST 1 VIEW COMPARISON:  10/13/2021. FINDINGS: Unchanged hyperexpanded lungs with prominent interstitial opacities, compatible with emphysema. Hazy opacities in the lingula obscure the left heart border, possibly reflecting aspiration or infection. No pleural effusion or pneumothorax. IMPRESSION: Hazy opacities in the lingula, possibly reflecting aspiration or infection. Electronically Signed   By: Orvan Falconer M.D.   On: 09/13/2022 18:15   DG Pelvis 1-2 Views  Result Date: 09/13/2022 CLINICAL DATA:  Larey Seat, low back pain EXAM: PELVIS - 1-2 VIEW COMPARISON:  None Available. FINDINGS: Single frontal view of the pelvis includes both hips. The bones are diffusely osteopenic. No acute displaced fracture, subluxation, or dislocation. Symmetrical bilateral hip osteoarthritis. IMPRESSION: 1. Osteopenia and bilateral hip osteoarthritis. 2. No acute displaced fracture. Electronically Signed   By: Sharlet Salina M.D.   On: 09/13/2022 17:14   DG Knee Complete 4 Views  Right  Result Date: 09/13/2022 CLINICAL DATA:  Right knee pain after fall EXAM: RIGHT KNEE - COMPLETE 4+ VIEW COMPARISON:  None Available. FINDINGS: Frontal, bilateral oblique, and cross-table lateral views of the right knee are obtained. No acute fracture, subluxation, or dislocation. There is 3 compartmental joint space narrowing. Diffuse chondrocalcinosis is identified, as well as calcifications along the joint capsule. Heterotopic ossification versus large enthesophyte is seen on the lower pole of the patella and along the course of the patellar tendon. No joint effusion. Soft tissues are otherwise unremarkable. IMPRESSION: 1. No acute displaced fracture. 2. 3 compartmental joint space narrowing, with chondrocalcinosis and diffuse capsular calcifications. 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 3. Large enthesophyte versus heterotopic ossification within the patellar tendon. Electronically Signed   By: Sharlet Salina M.D.   On: 09/13/2022 17:13   DG Lumbar Spine 2-3 Views  Result Date: 09/13/2022 CLINICAL DATA:  Low back pain after fall EXAM: LUMBAR SPINE - 2-3 VIEW COMPARISON:  11/17/2003 FINDINGS: Frontal and cross-table lateral views  of the lumbar spine are obtained on 3 images. There are 5 non-rib-bearing lumbar type vertebral bodies identified, with mild right convex scoliosis centered at the L3 level. There is a chronic compression deformity of the L3 vertebral body, with less than 50% loss of height. No evidence of acute fracture. There is diffuse lumbar spondylosis and facet hypertrophy, greatest from L3-4 through L5-S1. Sacroiliac joints are unremarkable. Symmetrical bilateral hip osteoarthritis. IMPRESSION: 1. Chronic L3 compression deformity with less than 50% loss of height. 2. No acute displaced fracture. 3. 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 Electronically Signed   By: Sharlet Salina M.D.   On: 09/13/2022 17:11   CT Head Wo Contrast  Result Date:  09/13/2022 CLINICAL DATA:  Head trauma, minor (Age >= 65y); Neck trauma (Age >= 65y). EXAM: CT HEAD WITHOUT CONTRAST CT CERVICAL SPINE WITHOUT CONTRAST TECHNIQUE: Multidetector CT imaging of the head and cervical spine was performed following the standard protocol without intravenous contrast. Multiplanar CT image reconstructions of the cervical spine were also generated. RADIATION DOSE REDUCTION: This exam was performed according to the departmental dose-optimization program which includes automated exposure control, adjustment of the mA and/or kV according to patient size and/or use of iterative reconstruction technique. COMPARISON:  CT head and cervical spine 07/07/2021. MRI brain 12/18/2021. FINDINGS: CT HEAD FINDINGS Brain: No acute intracranial hemorrhage. Gray-white differentiation is preserved. No hydrocephalus or extra-axial collection. Unchanged 11 mm meningioma in the right posterior fossa (axial image 7 series 2). No significant mass effect or midline shift. Vascular: No hyperdense vessel or unexpected calcification. Skull: No calvarial fracture or suspicious bone lesion. Skull base is unremarkable. Sinuses/Orbits: Unremarkable. Other: None. CT CERVICAL SPINE FINDINGS Alignment: Normal. Skull base and vertebrae: No acute fracture. Intact craniocervical junction. Postoperative changes of prior C3-C7 corpectomy and ACDF. Intact hardware. No associated lucency. Solid bony fusion across the intervening levels. Soft tissues and spinal canal: No prevertebral fluid or swelling. No visible canal hematoma. Disc levels:  No high-grade spinal canal stenosis. Upper chest: Patchy ground-glass opacities in the lung apices, likely infectious or inflammatory. Other: None. IMPRESSION: 1. No acute intracranial abnormality. 2. No acute fracture or traumatic listhesis of the cervical spine. 3. Patchy ground-glass opacities in the lung apices, likely infectious or inflammatory. Electronically Signed   By: Orvan Falconer M.D.    On: 09/13/2022 16:45   CT Cervical Spine Wo Contrast  Result Date: 09/13/2022 CLINICAL DATA:  Head trauma, minor (Age >= 65y); Neck trauma (Age >= 65y). EXAM: CT HEAD WITHOUT CONTRAST CT CERVICAL SPINE WITHOUT CONTRAST TECHNIQUE: Multidetector CT imaging of the head and cervical spine was performed following the standard protocol without intravenous contrast. Multiplanar CT image reconstructions of the cervical spine were also generated. RADIATION DOSE REDUCTION: This exam was performed according to the departmental dose-optimization program which includes automated exposure control, adjustment of the mA and/or kV according to patient size and/or use of iterative reconstruction technique. COMPARISON:  CT head and cervical spine 07/07/2021. MRI brain 12/18/2021. FINDINGS: CT HEAD FINDINGS Brain: No acute intracranial hemorrhage. Gray-white differentiation is preserved. No hydrocephalus or extra-axial collection. Unchanged 11 mm meningioma in the right posterior fossa (axial image 7 series 2). No significant mass effect or midline shift. Vascular: No hyperdense vessel or unexpected calcification. Skull: No calvarial fracture or suspicious bone lesion. Skull base is unremarkable. Sinuses/Orbits: Unremarkable. Other: None. CT CERVICAL SPINE FINDINGS Alignment: Normal. Skull base and vertebrae: No acute fracture. Intact craniocervical junction. Postoperative changes of prior C3-C7 corpectomy and ACDF. Intact  hardware. No associated lucency. Solid bony fusion across the intervening levels. Soft tissues and spinal canal: No prevertebral fluid or swelling. No visible canal hematoma. Disc levels:  No high-grade spinal canal stenosis. Upper chest: Patchy ground-glass opacities in the lung apices, likely infectious or inflammatory. Other: None. IMPRESSION: 1. No acute intracranial abnormality. 2. No acute fracture or traumatic listhesis of the cervical spine. 3. Patchy ground-glass opacities in the lung apices, likely  infectious or inflammatory. Electronically Signed   By: Orvan Falconer M.D.   On: 09/13/2022 16:45    Principal Problem:   Pneumonia Active Problems:   CAD (coronary artery disease), autologous vein bypass graft   Essential hypertension   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)   OSA on CPAP   Type 2 diabetes mellitus with diabetic chronic kidney disease (HCC)   Chronic kidney disease, stage 3b (HCC)   Elevated CK   Generalized weakness     Savior Himebaugh Tublu Casidee Jann, [facility]  If 7PM-7AM, please contact night-coverage [email or web address]   LOS: 2 days

## 2022-09-15 NOTE — Progress Notes (Addendum)
Mobility Specialist - Progress Note  During mobility: SpO2 86-91% Post-mobility: SPO2 92%   09/15/22 0954  Mobility  Activity Ambulated with assistance in room  Level of Assistance Standby assist, set-up cues, supervision of patient - no hands on  Assistive Device Front wheel walker  Distance Ambulated (ft) 40 ft  Activity Response Tolerated well  $Mobility charge 1 Mobility  Mobility Specialist Start Time (ACUTE ONLY) [id number]  Mobility Specialist Stop Time (ACUTE ONLY) 0950  Mobility Specialist Time Calculation (min) (ACUTE ONLY) 21 min    Pt sitting EOB upon entry, utilizing RA. Pt expressed back pain (6/10) while seated. Pt agreeable to OOB within the room. Pt STS to RW and amb 40 ft within the room MinG- SBA. During amb O2 desat to 86%-- pt stopped for a seated rest break to practice pursed lip breathing, after one min O2 quickly saturated to 91%. Pt returned supine -- expressed that returning supine cause "a great deal of pain." (10/10) Pt left supine with alarm set and needs within reach, O2 > 90%. RN notified.   Zetta Bills Mobility Specialist 09/15/22 10:03 AM

## 2022-09-15 NOTE — TOC Initial Note (Signed)
Transition of Care [facility]) - Initial/Assessment Note    Patient Details  Name: Rhonda Tyler MRN: [id number] Date of Birth: Feb 20, 1941  [facility] CM/SW Contact:    Allena Katz, LCSW Phone Number: 09/15/2022, 10:21 AM  Clinical Narrative:   CSW spoke with patient to complete HRA. Pt reports that she lives alone and has been doing well with this until she had this recent fall when her power went out. Pt reports she has a RW and BSC at home that she utilizes. Pt reports she does currently drive to appointments but does have a companion that she hires to come 2-3 times a week from 8-12:30. Pt states her companion was through an agency but decided to go out on her own so patient pays her directly.  Pt reports that she is not interested in any additional help at this time. Pt is interested in [facility] and reports she does not currently have an agency preference. Pt states she is active with Dr. Dareen Piano for Primary care and states she has no further questions or needs at this time. CSW to arrange for [facility] services for patient.              Expected Discharge Plan: Home w Home Health Services Barriers to Discharge: Continued Medical Work up   Patient Goals and CMS Choice Patient states their goals for this hospitalization and ongoing recovery are:: return home with [facility] CMS Medicare.gov Compare Post Acute Care list provided to:: Patient Choice offered to / list presented to : Patient      Expected Discharge Plan and Services                         DME Arranged: Walker rolling, 3-N-1         HH Arranged: PT, OT, RN, Nurse's Aide          Prior Living Arrangements/Services   Lives with:: Self Patient language and need for interpreter reviewed:: Yes Do you feel safe going back to the place where you live?: Yes      Need for Family Participation in Patient Care: No (Comment) Care giver support system in place?: Yes (comment) Current home services: DME    Activities of  Daily Living Home Assistive Devices/Equipment: Walker (specify type), Grab bars in shower, Shower chair without back ADL Screening (condition at time of admission) Patient's cognitive ability adequate to safely complete daily activities?: Yes Is the patient deaf or have difficulty hearing?: Yes Does the patient have difficulty seeing, even when wearing glasses/contacts?: Yes Does the patient have difficulty concentrating, remembering, or making decisions?: No Patient able to express need for assistance with ADLs?: Yes Does the patient have difficulty dressing or bathing?: No Independently performs ADLs?: No Communication: Needs assistance Is this a change from baseline?: Change from baseline, expected to last >3 days Dressing (OT): Needs assistance Is this a change from baseline?: Change from baseline, expected to last >3 days Grooming: Needs assistance Is this a change from baseline?: Change from baseline, expected to last >3 days Feeding: Independent Bathing: Needs assistance Is this a change from baseline?: Change from baseline, expected to last >3 days Toileting: Needs assistance Is this a change from baseline?: Change from baseline, expected to last >3days In/Out Bed: Needs assistance Is this a change from baseline?: Change from baseline, expected to last >3 days Walks in Home: Needs assistance Is this a change from baseline?: Change from baseline, expected to last >3 days  Does the patient have difficulty walking or climbing stairs?: Yes Weakness of Legs: Both Weakness of Arms/Hands: None  Permission Sought/Granted      Share Information with NAME: home health  Permission granted to share info w AGENCY: home health        Emotional Assessment Appearance:: Appears stated age   Affect (typically observed): Stable Orientation: : Oriented to Self, Oriented to Place, Oriented to  Time, Oriented to Situation Alcohol / Substance Use: Not Applicable    Admission diagnosis:   Pneumonia [J18.9] Generalized weakness [R53.1] Traumatic rhabdomyolysis, initial encounter (HCC) [T79.6XXA] Community acquired pneumonia of left lung, unspecified part of lung [J18.9] Patient Active Problem List   Diagnosis Date Noted   Pneumonia 09/13/2022   Elevated CK 09/13/2022   Generalized weakness 09/13/2022   Hyponatremia 10/14/2021   Hyperlipemia    Type II diabetes mellitus with renal manifestations (HCC)    Chronic kidney disease, stage 3b (HCC)    SVT (supraventricular tachycardia)    Elevated troponin    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   Periprosthetic fracture around internal prosthetic left knee joint 05/05/2021   Idiopathic gout, unspecified site 11/20/2020   Itching 11/20/2020   Anemia in chronic kidney disease 10/16/2020   Heart failure, unspecified (HCC) 10/16/2020   Hyperparathyroidism due to renal insufficiency (HCC) 10/16/2020   Proteinuria, unspecified 10/16/2020   Chronic kidney disease, stage 4 (severe) (HCC) 06/03/2020   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) 01/22/2020   Arthritis of left shoulder region 01/06/2020   Iron deficiency 01/06/2020   Chest pain 01/05/2020   AKI (acute kidney injury) (HCC)    Essential hypertension    OSA on CPAP 09/05/2019   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  [place]) [place] [address] Phone: [phone] Fax: [phone]     Social Determinants of Health (SDOH) Social History: SDOH Screenings   Food Insecurity: No Food Insecurity (09/13/2022)  Housing: Low Risk  (09/13/2022)  Transportation Needs: No Transportation Needs (09/13/2022)  Utilities: Not At Risk (09/13/2022)  Tobacco Use: Medium Risk (09/13/2022)   SDOH Interventions:     Readmission Risk Interventions    09/15/2022   10:21 AM  Readmission Risk Prevention Plan  Transportation Screening Complete  PCP or Specialist Appt within 3-5 Days Complete  HRI or Home Care Consult Complete  Social Work Consult for Recovery Care Planning/Counseling Complete  Palliative Care Screening Complete  Medication Review Oceanographer) Complete

## 2022-09-16 ENCOUNTER — Inpatient Hospital Stay: Payer: Medicare Other

## 2022-09-16 DIAGNOSIS — M79605 Pain in left leg: Secondary | ICD-10-CM | POA: Diagnosis not present

## 2022-09-16 DIAGNOSIS — S8992XA Unspecified injury of left lower leg, initial encounter: Secondary | ICD-10-CM

## 2022-09-16 DIAGNOSIS — J189 Pneumonia, unspecified organism: Secondary | ICD-10-CM | POA: Diagnosis not present

## 2022-09-16 DIAGNOSIS — R531 Weakness: Secondary | ICD-10-CM | POA: Diagnosis not present

## 2022-09-16 LAB — CBC
HCT: 30.3 % — ABNORMAL LOW (ref 36.0–46.0)
Hemoglobin: 9.3 g/dL — ABNORMAL LOW (ref 12.0–15.0)
MCH: 28.4 pg (ref 26.0–34.0)
MCHC: 30.7 g/dL (ref 30.0–36.0)
MCV: 92.7 fL (ref 80.0–100.0)
Platelets: 177 10*3/uL (ref 150–400)
RBC: 3.27 MIL/uL — ABNORMAL LOW (ref 3.87–5.11)
RDW: 13.9 % (ref 11.5–15.5)
WBC: 6.2 10*3/uL (ref 4.0–10.5)
nRBC: 0 % (ref 0.0–0.2)

## 2022-09-16 LAB — COMPREHENSIVE METABOLIC PANEL
ALT: 20 U/L (ref 0–44)
AST: 26 U/L (ref 15–41)
Albumin: 2.9 g/dL — ABNORMAL LOW (ref 3.5–5.0)
Alkaline Phosphatase: 54 U/L (ref 38–126)
Anion gap: 5 (ref 5–15)
BUN: 40 mg/dL — ABNORMAL HIGH (ref 8–23)
CO2: 26 mmol/L (ref 22–32)
Calcium: 9.1 mg/dL (ref 8.9–10.3)
Chloride: 104 mmol/L (ref 98–111)
Creatinine, Ser: 1.27 mg/dL — ABNORMAL HIGH (ref 0.44–1.00)
GFR, Estimated: 43 mL/min — ABNORMAL LOW (ref >60)
Glucose, Bld: 165 mg/dL — ABNORMAL HIGH (ref 70–99)
Potassium: 4 mmol/L (ref 3.5–5.1)
Sodium: 135 mmol/L (ref 135–145)
Total Bilirubin: 0.8 mg/dL (ref 0.3–1.2)
Total Protein: 5.7 g/dL — ABNORMAL LOW (ref 6.5–8.1)

## 2022-09-16 LAB — GLUCOSE, CAPILLARY
Glucose-Capillary: 145 mg/dL — ABNORMAL HIGH (ref 70–99)
Glucose-Capillary: 208 mg/dL — ABNORMAL HIGH (ref 70–99)
Glucose-Capillary: 218 mg/dL — ABNORMAL HIGH (ref 70–99)
Glucose-Capillary: 201 mg/dL — ABNORMAL HIGH (ref 70–99)

## 2022-09-16 LAB — CULTURE, BLOOD (ROUTINE X 2)
Culture: NO GROWTH
Culture: NO GROWTH
Special Requests: ADEQUATE

## 2022-09-16 MED ORDER — POLYETHYLENE GLYCOL 3350 17 G PO PACK
17.0000 g | PACK | Freq: Two times a day (BID) | ORAL | Status: DC
  Administered 2022-09-16 – 2022-09-22 (×8): 17 g via ORAL
  Filled 2022-09-16 (×11): qty 1

## 2022-09-16 NOTE — Care Management Important Message (Signed)
Important Message  Patient Details  Name: WENDELLA WITZIG MRN: [id number] Date of Birth: 01-28-1941   Medicare Important Message Given:  Yes     Johnell Comings 09/16/2022, 11:06 AM

## 2022-09-16 NOTE — Progress Notes (Signed)
Rhonda Tyler:[id number] DOB: [birth year] DOA: 09/13/2022 PCP: Lauro Regulus, MD   Subj: 82 yo WF PMHx HTN, CAD, DM2, CKD stage IIIb, Cirrhosis secondary to NASH  Admitted for generalized fatigue, malaise, myalgias, decreased p.o. intake and DOE with subsequent mechanical fall.  Workup was essentially unrevealing however CXR showed possible haziness at lingula and patient was admitted for CAP.     Obj: A/O x 4, has had multiple falls at home.  NO One at home.  Lives alone.  Positive LLE pain (new)   Objective: VITAL SIGNS: Temp: 98.6 F (37 C) (05/08 0530) Temp Source: Oral (05/07 1936) BP: 118/56 (05/08 0530) Pulse Rate: 64 (05/08 0530)   VENTILATOR SETTINGS: Room air 5/8 SpO2 96%  Procedures/Significant Events:    Consultants:     Cultures   Antimicrobials: Anti-infectives (From admission, onward)    Start     Ordered Stop   09/14/22 2100  cefTRIAXone (ROCEPHIN) 2 g in sodium chloride 0.9 % 100 mL IVPB        09/13/22 2122 09/19/22 0559   09/14/22 2100  azithromycin (ZITHROMAX) tablet 500 mg        09/13/22 2122 09/18/22 0959   09/13/22 2100  cefTRIAXone (ROCEPHIN) 2 g in sodium chloride 0.9 % 100 mL IVPB        09/13/22 2059 09/13/22 2215   09/13/22 2100  azithromycin (ZITHROMAX) 500 mg in sodium chloride 0.9 % 250 mL IVPB        09/13/22 2059 09/14/22 0117        Intake/Output Summary (Last 24 hours) at 09/16/2022 0708 Last data filed at 09/15/2022 1900 Gross per 24 hour  Intake 480 ml  Output --  Net 480 ml     Exam: Physical Exam:  General: A/O x 4, No acute respiratory distress Eyes: negative scleral hemorrhage, negative anisocoria, negative icterus ENT: Negative Runny nose, negative gingival bleeding, Neck:  Negative scars, masses, torticollis, lymphadenopathy, JVD Lungs: Clear to auscultation bilaterally without wheezes or crackles Cardiovascular: Regular rate and rhythm without murmur gallop or rub normal S1 and S2 Abdomen: negative  abdominal pain, nondistended, positive soft, bowel sounds, no rebound, no ascites, no appreciable mass Extremities: L LE pain to palpation, cording from LEFT knee down gastrocnemius muscle.  LEFT knee well-healed TKA incision site minimal pain to palpation Skin: Negative rashes, lesions, ulcers Psychiatric:  Negative depression, negative anxiety, negative fatigue, negative mania  Central nervous system:  Cranial nerves II through XII intact, tongue/uvula midline, all extremities muscle strength 5/5, sensation intact throughout, negative dysarthria, negative expressive aphasia, negative receptive aphasia.   .   DVT prophylaxis: Subcu heparin Code Status: Full Family Communication:  Status is: Inpatient    Dispo: The patient is from: Home              Anticipated d/c is to: SNF              Anticipated d/c date is: > 3 days              Patient currently is not medically stable to d/c.      Assessment & Plan: Covid vaccination;   Principal Problem:   Pneumonia Active Problems:   CAD (coronary artery disease), autologous vein bypass graft   Essential hypertension   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)   OSA on CPAP   Type 2 diabetes mellitus with diabetic chronic kidney disease (HCC)   Chronic kidney disease, stage 3b (HCC)   Elevated  CK   Generalized weakness  Anemia unspecified  Lab Results  Component Value Date   HGB 9.3 (L) 09/16/2022   HGB 8.6 (L) 09/15/2022   HGB 9.6 (L) 09/14/2022   HGB 11.3 (L) 09/13/2022   HGB 10.1 (L) 08/24/2022  -5/8 stable appears to be trending up. -5/8 anemia panel pending - 5/8 occult blood pending -Protonix IV 40 mg BID   Acute on chronic Constipation -Protonix patient states that she has had "spells like this at home but not so bad". -Senna 8.6 mg PRN -5/8 MiraLAX BID    Malaise/myalgias/asthenia -Resolved most likely secondary to CAP, mechanical fall.  CAP -Complete 7-day course of  antibiotics  -5/8 procalcitonin pending     Metabolic acidosis -Resolved.     Mechanical fall -Trauma to left calf - 5/8 left lower extremity Doppler pending -5/8 physical therapy reevaluate patient.  Patient lives at home not a safe discharge.  Evaluate for CIR vs SNF   Elevated CPK -Secondary to mechanical fall. - CPK elevation resolved, last check at 104   Cirrhosis secondary to NASH -Propranolol 60 mg daily -Diuretics being held given concern for intravascular fluid depletion, can consider restarting   CKD 3B Lab Results  Component Value Date   CREATININE 1.27 (H) 09/16/2022   CREATININE 1.26 (H) 09/15/2022   CREATININE 1.41 (H) 09/14/2022   CREATININE 1.68 (H) 09/13/2022   CREATININE 1.80 (H) 08/24/2022     Essential HTN -Cardizem 120 mg daily - Lisinopril 5 mg daily -Propranolol 60 mg daily    DM type II uncontrolled with hyperglycemia -Semglee 14 units BID -Sensitive SSI -CBG (last 3)  Recent Labs    09/15/22 1937 09/16/22 0815 09/16/22 1208  GLUCAP 171* 145* 208*  -5/8 hemoglobin A 1C pending - 5/8 lipid panel pending   Goals of care -5/8 physical therapy reevaluate patient.  Patient lives at home not a safe discharge.  Evaluate for CIR vs SNF      Mobility Assessment (last 72 hours)     Mobility Assessment     Row Name 09/14/22 2015 09/14/22 1228 09/13/22 2334       Does patient have an order for bedrest or is patient medically unstable No - Continue assessment -- No - Continue assessment     What is the highest level of mobility based on the progressive mobility assessment? Level 4 (Walks with assist in room) - Balance while marching in place and cannot step forward and back - Complete Level 4 (Walks with assist in room) - Balance while marching in place and cannot step forward and back - Complete Level 3 (Stands with assist) - Balance while standing  and cannot march in place     Is the above level different from baseline mobility prior to  current illness? Yes - Recommend PT order -- Yes - Recommend PT order                   Time: 50 minutes         Care during the described time interval was provided by me .  I have reviewed this patient's available data, including medical history, events of note, physical examination, and all test results as part of my evaluation.

## 2022-09-16 NOTE — TOC Progression Note (Signed)
Transition of Care [facility]) - Progression Note    Patient Details  Name: BISCEGLIA PYEATT MRN: [id number] Date of Birth: Jul 18, 1940  Transition of Care [facility]) CM/SW Contact  Allena Katz, LCSW Phone Number: 09/16/2022, 9:51 AM  Clinical Narrative:   [facility] accepted pt for PT/OT/RN/AID.    Expected Discharge Plan: Home w Home Health Services Barriers to Discharge: Continued Medical Work up  Expected Discharge Plan and Services                         DME Arranged: Walker rolling, 3-N-1         HH Arranged: PT, OT, RN, Nurse's Aide           Social Determinants of Health (SDOH) Interventions SDOH Screenings   Food Insecurity: No Food Insecurity (09/13/2022)  Housing: Low Risk  (09/13/2022)  Transportation Needs: No Transportation Needs (09/13/2022)  Utilities: Not At Risk (09/13/2022)  Tobacco Use: Medium Risk (09/13/2022)    Readmission Risk Interventions    09/15/2022   10:21 AM  Readmission Risk Prevention Plan  Transportation Screening Complete  PCP or Specialist Appt within 3-5 Days Complete  HRI or Home Care Consult Complete  Social Work Consult for Recovery Care Planning/Counseling Complete  Palliative Care Screening Complete  Medication Review [employer]) Complete

## 2022-09-16 NOTE — Progress Notes (Signed)
Physical Therapy Treatment Patient Details Name: Rhonda Tyler MRN: [id number] DOB: May 10, 1941 Today's Date: 09/16/2022   History of Present Illness Pt is a 82 y.o. female presenting to hospital 09/13/22 s/p fall at home; unable to get up off the floor; c/o pain in mid upper back, B knees, and head.  Recent fatigue, generalized weakness, aches, SOB, and cough.  Pt admitted with PNA, generalized weakness, and elevated CK.  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    PT Comments    Patient received sitting up on side of bed, reports feeling bad today, nausea. She states she is waiting for a BSC. Assisted patient with getting equipment and patient is able to stand with min guard. Ambulated ~15 feet to [facility] with min guard assist and RW. Then ambulated back 15 feet to recliner. Patient reporting increased nausea after mobility. Emesis bag left near patient. No vomiting. Patient will continue to benefit from skilled PT to improve strength, activity tolerance for improved safety with mobility.       Recommendations for follow up therapy are one component of a multi-disciplinary discharge planning process, led by the attending physician.  Recommendations may be updated based on patient status, additional functional criteria and insurance authorization.  Follow Up Recommendations       Assistance Recommended at Discharge Intermittent Supervision/Assistance  Patient can return home with the following A little help with walking and/or transfers;A little help with bathing/dressing/bathroom;Assistance with cooking/housework;Assist for transportation;Help with stairs or ramp for entrance   Equipment Recommendations  BSC/3in1    Recommendations for Other Services       Precautions / Restrictions Precautions Precautions: Fall Restrictions Weight Bearing Restrictions: No     Mobility  Bed Mobility               General bed mobility  comments: NT, patient received sitting up on side of bed    Transfers Overall transfer level: Needs assistance Equipment used: Rolling walker (2 wheels) Transfers: Sit to/from Stand             General transfer comment: vc's for UE placement; mild increased effort to stand up to RW but steady    Ambulation/Gait Ambulation/Gait assistance: Min guard Gait Distance (Feet): 20 Feet Assistive device: Rolling walker (2 wheels) Gait Pattern/deviations: Step-through pattern, Decreased step length - right, Decreased step length - left, Trunk flexed Gait velocity: decreased     General Gait Details: limited by fatigue and nausea this session   Stairs             Wheelchair Mobility    Modified Rankin (Stroke Patients Only)       Balance Overall balance assessment: Needs assistance Sitting-balance support: Feet supported Sitting balance-Leahy Scale: Normal     Standing balance support: Bilateral upper extremity supported, During functional activity, Reliant on assistive device for balance Standing balance-Leahy Scale: Good Standing balance comment: steady walking short distance with RW use                            Cognition Arousal/Alertness: Awake/alert Behavior During Therapy: WFL for tasks assessed/performed Overall Cognitive Status: Within Functional Limits for tasks assessed  Exercises      General Comments        Pertinent Vitals/Pain Pain Assessment Pain Assessment: No/denies pain    Home Living                          Prior Function            PT Goals (current goals can now be found in the care plan section) Acute Rehab PT Goals Patient Stated Goal: to improve strength, breathing, and mobility PT Goal Formulation: With patient Time For Goal Achievement: 09/28/22 Potential to Achieve Goals: Good Progress towards PT goals: Progressing toward goals     Frequency    Min 3X/week      PT Plan Current plan remains appropriate    Co-evaluation              AM-PAC PT "6 Clicks" Mobility   Outcome Measure  Help needed turning from your back to your side while in a flat bed without using bedrails?: None Help needed moving from lying on your back to sitting on the side of a flat bed without using bedrails?: None Help needed moving to and from a bed to a chair (including a wheelchair)?: A Little Help needed standing up from a chair using your arms (e.g., wheelchair or bedside chair)?: A Little Help needed to walk in hospital room?: A Little Help needed climbing 3-5 steps with a railing? : A Lot 6 Click Score: 19    End of Session   Activity Tolerance: Patient limited by fatigue;Other (comment) (nausea) Patient left: in chair;with call bell/phone within reach;with chair alarm set Nurse Communication: Mobility status PT Visit Diagnosis: Muscle weakness (generalized) (M62.81);History of falling (Z91.81);Difficulty in walking, not elsewhere classified (R26.2)     Time: 1610-9604 PT Time Calculation (min) (ACUTE ONLY): 11 min  Charges:  $Gait Training: 8-22 mins                     Braileigh Landenberger, PT, GCS 09/16/22,11:19 AM

## 2022-09-17 ENCOUNTER — Inpatient Hospital Stay: Payer: Medicare Other

## 2022-09-17 DIAGNOSIS — R0602 Shortness of breath: Secondary | ICD-10-CM

## 2022-09-17 DIAGNOSIS — R531 Weakness: Secondary | ICD-10-CM | POA: Diagnosis not present

## 2022-09-17 DIAGNOSIS — S8992XA Unspecified injury of left lower leg, initial encounter: Secondary | ICD-10-CM | POA: Diagnosis not present

## 2022-09-17 DIAGNOSIS — J189 Pneumonia, unspecified organism: Secondary | ICD-10-CM | POA: Diagnosis not present

## 2022-09-17 DIAGNOSIS — M79605 Pain in left leg: Secondary | ICD-10-CM | POA: Diagnosis not present

## 2022-09-17 DIAGNOSIS — R7989 Other specified abnormal findings of blood chemistry: Secondary | ICD-10-CM

## 2022-09-17 LAB — VITAMIN B12: Vitamin B-12: 5019 pg/mL — ABNORMAL HIGH (ref 180–914)

## 2022-09-17 LAB — CBC WITH DIFFERENTIAL/PLATELET
Abs Immature Granulocytes: 0.03 10*3/uL (ref 0.00–0.07)
Basophils Absolute: 0 10*3/uL (ref 0.0–0.1)
Basophils Relative: 0 %
Eosinophils Absolute: 0.2 10*3/uL (ref 0.0–0.5)
Eosinophils Relative: 4 %
HCT: 27 % — ABNORMAL LOW (ref 36.0–46.0)
Hemoglobin: 8.5 g/dL — ABNORMAL LOW (ref 12.0–15.0)
Immature Granulocytes: 1 %
Lymphocytes Relative: 16 %
Lymphs Abs: 0.7 10*3/uL (ref 0.7–4.0)
MCH: 29.3 pg (ref 26.0–34.0)
MCHC: 31.5 g/dL (ref 30.0–36.0)
MCV: 93.1 fL (ref 80.0–100.0)
Monocytes Absolute: 0.5 10*3/uL (ref 0.1–1.0)
Monocytes Relative: 10 %
Neutro Abs: 3.2 10*3/uL (ref 1.7–7.7)
Neutrophils Relative %: 69 %
Platelets: 166 10*3/uL (ref 150–400)
RBC: 2.9 MIL/uL — ABNORMAL LOW (ref 3.87–5.11)
RDW: 14 % (ref 11.5–15.5)
WBC: 4.6 10*3/uL (ref 4.0–10.5)
nRBC: 0 % (ref 0.0–0.2)

## 2022-09-17 LAB — PHOSPHORUS: Phosphorus: 1.7 mg/dL — ABNORMAL LOW (ref 2.5–4.6)

## 2022-09-17 LAB — IRON AND TIBC
Iron: 16 ug/dL — ABNORMAL LOW (ref 28–170)
Saturation Ratios: 5 % — ABNORMAL LOW (ref 10.4–31.8)
TIBC: 311 ug/dL (ref 250–450)
UIBC: 295 ug/dL

## 2022-09-17 LAB — HEMOGLOBIN A1C
Hgb A1c MFr Bld: 6.3 % — ABNORMAL HIGH (ref 4.8–5.6)
Mean Plasma Glucose: 134.11 mg/dL

## 2022-09-17 LAB — FERRITIN: Ferritin: 60 ng/mL (ref 11–307)

## 2022-09-17 LAB — FOLATE: Folate: 15.2 ng/mL (ref >5.9)

## 2022-09-17 LAB — TROPONIN I (HIGH SENSITIVITY)
Troponin I (High Sensitivity): 61 ng/L — ABNORMAL HIGH (ref <18)
Troponin I (High Sensitivity): 60 ng/L — ABNORMAL HIGH (ref <18)
Troponin I (High Sensitivity): 44 ng/L — ABNORMAL HIGH (ref <18)
Troponin I (High Sensitivity): 40 ng/L — ABNORMAL HIGH (ref <18)

## 2022-09-17 LAB — CK: Total CK: 36 U/L — ABNORMAL LOW (ref 38–234)

## 2022-09-17 LAB — COMPREHENSIVE METABOLIC PANEL
ALT: 17 U/L (ref 0–44)
AST: 20 U/L (ref 15–41)
Albumin: 2.5 g/dL — ABNORMAL LOW (ref 3.5–5.0)
Alkaline Phosphatase: 49 U/L (ref 38–126)
Anion gap: 7 (ref 5–15)
BUN: 35 mg/dL — ABNORMAL HIGH (ref 8–23)
CO2: 23 mmol/L (ref 22–32)
Calcium: 8.9 mg/dL (ref 8.9–10.3)
Chloride: 105 mmol/L (ref 98–111)
Creatinine, Ser: 1.31 mg/dL — ABNORMAL HIGH (ref 0.44–1.00)
GFR, Estimated: 41 mL/min — ABNORMAL LOW (ref >60)
Glucose, Bld: 151 mg/dL — ABNORMAL HIGH (ref 70–99)
Potassium: 4.4 mmol/L (ref 3.5–5.1)
Sodium: 135 mmol/L (ref 135–145)
Total Bilirubin: 0.5 mg/dL (ref 0.3–1.2)
Total Protein: 5.2 g/dL — ABNORMAL LOW (ref 6.5–8.1)

## 2022-09-17 LAB — RETICULOCYTES
Immature Retic Fract: 34.3 % — ABNORMAL HIGH (ref 2.3–15.9)
RBC.: 2.93 MIL/uL — ABNORMAL LOW (ref 3.87–5.11)
Retic Count, Absolute: 94.1 10*3/uL (ref 19.0–186.0)
Retic Ct Pct: 3.2 % — ABNORMAL HIGH (ref 0.4–3.1)

## 2022-09-17 LAB — D-DIMER, QUANTITATIVE: D-Dimer, Quant: 1.16 ug/mL-FEU — ABNORMAL HIGH (ref 0.00–0.50)

## 2022-09-17 LAB — GLUCOSE, CAPILLARY
Glucose-Capillary: 130 mg/dL — ABNORMAL HIGH (ref 70–99)
Glucose-Capillary: 165 mg/dL — ABNORMAL HIGH (ref 70–99)
Glucose-Capillary: 259 mg/dL — ABNORMAL HIGH (ref 70–99)
Glucose-Capillary: 298 mg/dL — ABNORMAL HIGH (ref 70–99)

## 2022-09-17 LAB — LIPID PANEL
Cholesterol: 85 mg/dL (ref 0–200)
HDL: 34 mg/dL — ABNORMAL LOW (ref >40)
LDL Cholesterol: 34 mg/dL (ref 0–99)
Total CHOL/HDL Ratio: 2.5 RATIO
Triglycerides: 84 mg/dL (ref <150)
VLDL: 17 mg/dL (ref 0–40)

## 2022-09-17 LAB — PROCALCITONIN: Procalcitonin: 0.11 ng/mL

## 2022-09-17 LAB — MAGNESIUM: Magnesium: 2 mg/dL (ref 1.7–2.4)

## 2022-09-17 MED ORDER — METHYLPREDNISOLONE SODIUM SUCC 125 MG IJ SOLR
60.0000 mg | Freq: Every day | INTRAMUSCULAR | Status: DC
  Administered 2022-09-17 – 2022-09-18 (×2): 60 mg via INTRAVENOUS
  Filled 2022-09-17 (×4): qty 2

## 2022-09-17 MED ORDER — DM-GUAIFENESIN ER 30-600 MG PO TB12
1.0000 | ORAL_TABLET | Freq: Two times a day (BID) | ORAL | Status: DC
  Administered 2022-09-17 – 2022-09-22 (×11): 1 via ORAL
  Filled 2022-09-17 (×11): qty 1

## 2022-09-17 MED ORDER — INSULIN ASPART 100 UNIT/ML IJ SOLN
0.0000 [IU] | INTRAMUSCULAR | Status: DC
  Administered 2022-09-17: 8 [IU] via SUBCUTANEOUS
  Administered 2022-09-18: 5 [IU] via SUBCUTANEOUS
  Administered 2022-09-18: 11 [IU] via SUBCUTANEOUS
  Filled 2022-09-17 (×3): qty 1

## 2022-09-17 MED ORDER — IPRATROPIUM-ALBUTEROL 0.5-2.5 (3) MG/3ML IN SOLN
3.0000 mL | Freq: Four times a day (QID) | RESPIRATORY_TRACT | Status: DC
  Administered 2022-09-17 – 2022-09-18 (×4): 3 mL via RESPIRATORY_TRACT
  Filled 2022-09-17 (×4): qty 3

## 2022-09-17 MED ORDER — POTASSIUM & SODIUM PHOSPHATES 280-160-250 MG PO PACK
1.0000 | PACK | Freq: Three times a day (TID) | ORAL | Status: AC
  Administered 2022-09-17 (×3): 1 via ORAL
  Filled 2022-09-17 (×4): qty 1

## 2022-09-17 NOTE — Progress Notes (Signed)
Physical Therapy Treatment Patient Details Name: Rhonda Tyler MRN: [id number] DOB: Oct 10, 1940 Today's Date: 09/17/2022   History of Present Illness Pt is a 82 y.o. female presenting to hospital 09/13/22 s/p fall at home; unable to get up off the floor; c/o pain in mid upper back, B knees, and head.  Recent fatigue, generalized weakness, aches, SOB, and cough.  Pt admitted with PNA, generalized weakness, and elevated CK.  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    PT Comments    Pt resting in bed upon PT arrival; pt reporting feeling tired and not feeling well today but agreeable to therapy.  During session pt SBA with bed mobility; CGA with transfers; and CGA to ambulate 15 feet (to bathroom) and then 30 feet with RW use (limited distance d/t generalized weakness and SOB).  On room air, pt's SpO2 sats 92% at rest; after walking 15 feet to bathroom pt's SpO2 sats 92%; after walking 30 feet pt's SpO2 sats 88% (MD and pt's nurse notified regarding pt's SpO2 during session).  Currently pt requires increased assist d/t pt's impaired activity tolerance and generalized weakness but pt reports her caregiver is going on vacation and will not have assist at home.  Pt requesting discharge to SNF.   Recommendations for follow up therapy are one component of a multi-disciplinary discharge planning process, led by the attending physician.  Recommendations may be updated based on patient status, additional functional criteria and insurance authorization.        Assistance Recommended at Discharge Intermittent Supervision/Assistance  Patient can return home with the following A little help with walking and/or transfers;A little help with bathing/dressing/bathroom;Assistance with cooking/housework;Assist for transportation;Help with stairs or ramp for entrance   Equipment Recommendations  BSC/3in1    Recommendations for Other Services        Precautions / Restrictions Precautions Precautions: Fall Restrictions Weight Bearing Restrictions: No     Mobility  Bed Mobility Overal bed mobility: Needs Assistance Bed Mobility: Supine to Sit, Sit to Supine     Supine to sit: Supervision, HOB elevated Sit to supine: Supervision, HOB elevated   General bed mobility comments: mild increased effort to perform on own    Transfers Overall transfer level: Needs assistance Equipment used: Rolling walker (2 wheels) Transfers: Sit to/from Stand Sit to Stand: Min guard           General transfer comment: vc's for UE placement; mild increased effort to stand up to RW but steady (x1 trial standing from bed and x1 trial standing from toilet using grab bar)    Ambulation/Gait Ambulation/Gait assistance: Min guard Gait Distance (Feet):  (15 feet; 30 feet) Assistive device: Rolling walker (2 wheels) Gait Pattern/deviations: Step-through pattern, Decreased step length - right, Decreased step length - left, Trunk flexed Gait velocity: decreased     General Gait Details: limited d/t SOB and generalized weakness   Stairs             Wheelchair Mobility    Modified Rankin (Stroke Patients Only)       Balance Overall balance assessment: Needs assistance Sitting-balance support: No upper extremity supported, Feet supported Sitting balance-Leahy Scale: Normal Sitting balance - Comments: steady sitting reaching outside BOS   Standing balance support: No upper extremity supported Standing balance-Leahy Scale: Good Standing balance comment: steady standing washing hands at sink  Cognition Arousal/Alertness: Awake/alert Behavior During Therapy: WFL for tasks assessed/performed Overall Cognitive Status: Within Functional Limits for tasks assessed                                          Exercises      General Comments  Nursing cleared pt for participation in  physical therapy.  Pt agreeable to PT session.      Pertinent Vitals/Pain Pain Assessment Pain Assessment: Faces Faces Pain Scale: Hurts little more Pain Location: low back and L knee Pain Intervention(s): Limited activity within patient's tolerance, Monitored during session, Premedicated before session, Repositioned HR WFL during sessions activities.    Home Living                          Prior Function            PT Goals (current goals can now be found in the care plan section) Acute Rehab PT Goals Patient Stated Goal: to improve strength, breathing, and mobility PT Goal Formulation: With patient Time For Goal Achievement: 09/28/22 Potential to Achieve Goals: Good Progress towards PT goals: Progressing toward goals    Frequency    Min 3X/week      PT Plan Discharge plan needs to be updated    Co-evaluation              AM-PAC PT "6 Clicks" Mobility   Outcome Measure  Help needed turning from your back to your side while in a flat bed without using bedrails?: None Help needed moving from lying on your back to sitting on the side of a flat bed without using bedrails?: None Help needed moving to and from a bed to a chair (including a wheelchair)?: A Little Help needed standing up from a chair using your arms (e.g., wheelchair or bedside chair)?: A Little Help needed to walk in hospital room?: A Little Help needed climbing 3-5 steps with a railing? : A Lot 6 Click Score: 19    End of Session Equipment Utilized During Treatment: Gait belt Activity Tolerance: Patient limited by fatigue Patient left: in bed;with call bell/phone within reach;with bed alarm set Nurse Communication: Mobility status;Precautions;Other (comment) (pt's O2 sats during session) PT Visit Diagnosis: Muscle weakness (generalized) (M62.81);History of falling (Z91.81);Difficulty in walking, not elsewhere classified (R26.2) Pain - part of body:  (low back; L knee)     Time:  7846-9629 PT Time Calculation (min) (ACUTE ONLY): 29 min  Charges:  $Gait Training: 8-22 mins $Therapeutic Activity: 8-22 mins                     Hendricks Limes, PT 09/17/22, 4:20 PM

## 2022-09-17 NOTE — Progress Notes (Signed)
Rhonda Tyler:[id number] DOB: June 04, 1940 DOA: 09/13/2022 PCP: Lauro Regulus, MD   Subj: 82 yo WF PMHx HTN, CAD, DM2, CKD stage IIIb, Cirrhosis secondary to NASH  Admitted for generalized fatigue, malaise, myalgias, decreased p.o. intake and DOE with subsequent mechanical fall.  Workup was essentially unrevealing however CXR showed possible haziness at lingula and patient was admitted for CAP.     Obj: 5/9 A/O x 4, states has increasing DOE.  Negative CP.   Objective: VITAL SIGNS: Temp: 97.9 F (36.6 C) (05/09 1702) Temp Source: Oral (05/09 1702) BP: 126/48 (05/09 1702) Pulse Rate: 65 (05/09 1702)   VENTILATOR SETTINGS: Room air 5/9 SpO2 96%   Procedures/Significant Events: 5/8 left lower extremity Doppler negative for DVT 5/9 PCXR -Mild left basilar and lingular opacity is noted concerning for atelectasis or possibly infiltrate. 5/9 CT chest pending  Consultants:     Cultures 5/6 SARS coronavirus negative 5/9 respiratory virus panel pending   Antimicrobials: Anti-infectives (From admission, onward)    Start     Ordered Stop   09/14/22 2100  cefTRIAXone (ROCEPHIN) 2 g in sodium chloride 0.9 % 100 mL IVPB        09/13/22 2122 09/19/22 0559   09/14/22 2100  azithromycin (ZITHROMAX) tablet 500 mg        09/13/22 2122 09/18/22 0959   09/13/22 2100  cefTRIAXone (ROCEPHIN) 2 g in sodium chloride 0.9 % 100 mL IVPB        09/13/22 2059 09/13/22 2215   09/13/22 2100  azithromycin (ZITHROMAX) 500 mg in sodium chloride 0.9 % 250 mL IVPB        09/13/22 2059 09/14/22 0117       No intake or output data in the 24 hours ending 09/17/22 1747  Physical Exam:  General: A/O x 4, No acute respiratory distress, positive DOE Eyes: negative scleral hemorrhage, negative anisocoria, negative icterus ENT: Negative Runny nose, negative gingival bleeding, Neck:  Negative scars, masses, torticollis, lymphadenopathy, JVD Lungs: Clear to auscultation bilaterally without  wheezes or crackles Cardiovascular: Regular rate and rhythm without murmur gallop or rub normal S1 and S2 Abdomen: negative abdominal pain, nondistended, positive soft, bowel sounds, no rebound, no ascites, no appreciable mass Extremities: No significant cyanosis, clubbing, or edema bilateral lower extremities Skin: Negative rashes, lesions, ulcers Psychiatric:  Negative depression, negative anxiety, negative fatigue, negative mania  Central nervous system:  Cranial nerves II through XII intact, tongue/uvula midline, all extremities muscle strength 5/5, sensation intact throughout, negative dysarthria, negative expressive aphasia, negative receptive aphasia. .   DVT prophylaxis: Subcu heparin Code Status: Full Family Communication:  Status is: Inpatient    Dispo: The patient is from: Home              Anticipated d/c is to: SNF              Anticipated d/c date is: > 3 days              Patient currently is not medically stable to d/c.      Assessment & Plan: Covid vaccination;   Principal Problem:   Pneumonia Active Problems:   CAD (coronary artery disease), autologous vein bypass graft   Essential hypertension   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)   OSA on CPAP   Type 2 diabetes mellitus with diabetic chronic kidney disease (HCC)   Chronic kidney disease, stage 3b (HCC)   Elevated CK   Generalized weakness  Iron deficiency anemia Lab Results  Component Value Date   HGB 8.5 (L) 09/17/2022   HGB 9.3 (L) 09/16/2022   HGB 8.6 (L) 09/15/2022   HGB 9.6 (L) 09/14/2022   HGB 11.3 (L) 09/13/2022  -5/8 stable appears to be trending up. - 5/8 occult blood pending - 5/9 occult blood pending -5/9 anemia panel consistent with iron deficiency. -5/9 given patient's unknown infection cause will hold on transfusing iron until completed at least 5 days of antibiotics. -Protonix IV 40 mg BID   Acute on chronic Constipation -Protonix patient  states that she has had "spells like this at home but not so bad". -Senna 8.6 mg PRN -5/8 MiraLAX BID    Malaise/myalgias/asthenia -Resolved most likely secondary to CAP, mechanical fall.  CAP -Complete 7-day course of antibiotics  - 5/9 procalcitonin 0.11.  Algorithm would encourage stopping antibiotics but patient acutely SOB so we will continue. - 5/9 PCXR no significant change.  Still some atelectasis lingula. - 5/9 EKG pending - 5/9 titrate O2 to maintain SpO2> 93%.  Continuous pulse ox -5/9 DuoNeb QID -5/9 flutter valve - 5/9 incentive spirometry - 5/9 Solu-Medrol 60 mg daily - 5/9 Mucinex DM -5/9 CT chest pending - 5/9 D-dimer pending     Metabolic acidosis -Resolved.     Mechanical fall -Trauma to left calf - 5/8 left lower extremity Doppler negative for DVT -5/8 physical therapy reevaluate patient.  Patient lives at home not a safe discharge.  Evaluate for CIR vs SNF   Elevated CPK -Secondary to mechanical fall. - CPK elevation resolved, last check at 104 Lab Results  Component Value Date   CKTOTAL 36 (L) 09/17/2022   CKTOTAL 104 09/15/2022   CKTOTAL 641 (H) 09/13/2022  -Resolved   Cirrhosis secondary to NASH -Propranolol 60 mg daily -Diuretics being held given concern for intravascular fluid depletion, can consider restarting -5/9 continue to hold diuretics   CKD stage IIIb (baseline 1.4-1.8) Lab Results  Component Value Date   CREATININE 1.31 (H) 09/17/2022   CREATININE 1.27 (H) 09/16/2022   CREATININE 1.26 (H) 09/15/2022   CREATININE 1.41 (H) 09/14/2022   CREATININE 1.68 (H) 09/13/2022  -5/9 better than baseline.   Essential HTN -Cardizem 120 mg daily - Lisinopril 5 mg daily -Propranolol 60 mg daily  Elevated troponin - 5/9 troponin mildly elevated we will continue to trend.  Latest Reference Range & Units 09/17/22 09:23 09/17/22 11:18  Troponin I (High Sensitivity) <18 ng/L 61 (H) 60 (H)  (H): Data is abnormally high -5/9 EKG possible  anterior infarct?.   DM type II controlled with hyperglycemia -Semglee 14 units BID -5/9 increase moderate SSI  CBG (last 3)  Recent Labs    09/17/22 0817 09/17/22 1205 09/17/22 1705  GLUCAP 130* 165* 259*  - 5/9 hemoglobin A 1C= 6.3 - 5/ lipid panel.  LDL= 34, within goal.  Hypophosphatemia - Phosphorus goal> 2.5 - 5/9 Phos-Nak 1 packet x 3 doses    Goals of care -5/8 physical therapy reevaluate patient.  Patient lives at home not a safe discharge.  Evaluate for CIR vs SNF      Mobility Assessment (last 72 hours)     Mobility Assessment     Row Name 09/17/22 1606 09/17/22 1000 09/16/22 1917 09/16/22 1500 09/16/22 1115   Does patient have an order for bedrest or is patient medically unstable -- No - Continue assessment No - Continue assessment -- --   What is the highest level of mobility based on the progressive mobility assessment? Level 5 (Walks with assist  in room/hall) - Balance while stepping forward/back and can walk in room with assist - Complete Level 5 (Walks with assist in room/hall) - Balance while stepping forward/back and can walk in room with assist - Complete Level 5 (Walks with assist in room/hall) - Balance while stepping forward/back and can walk in room with assist - Complete Level 5 (Walks with assist in room/hall) - Balance while stepping forward/back and can walk in room with assist - Complete Level 5 (Walks with assist in room/hall) - Balance while stepping forward/back and can walk in room with assist - Complete   Is the above level different from baseline mobility prior to current illness? -- Yes - Recommend PT order Yes - Recommend PT order -- --    Row Name 09/14/22 2015           Does patient have an order for bedrest or is patient medically unstable No - Continue assessment       What is the highest level of mobility based on the progressive mobility assessment? Level 4 (Walks with assist in room) - Balance while marching in place and cannot step  forward and back - Complete       Is the above level different from baseline mobility prior to current illness? Yes - Recommend PT order                     Time: 50 minutes         Care during the described time interval was provided by me .  I have reviewed this patient's available data, including medical history, events of note, physical examination, and all test results as part of my evaluation.

## 2022-09-18 ENCOUNTER — Inpatient Hospital Stay
Admit: 2022-09-18 | Discharge: 2022-09-18 | Disposition: A | Discharge status: Home / Self Care | Payer: Medicare Other | Attending: Internal Medicine | Admitting: Internal Medicine

## 2022-09-18 DIAGNOSIS — R531 Weakness: Secondary | ICD-10-CM | POA: Diagnosis not present

## 2022-09-18 DIAGNOSIS — S8992XA Unspecified injury of left lower leg, initial encounter: Secondary | ICD-10-CM | POA: Diagnosis not present

## 2022-09-18 DIAGNOSIS — J189 Pneumonia, unspecified organism: Secondary | ICD-10-CM | POA: Diagnosis not present

## 2022-09-18 DIAGNOSIS — M79605 Pain in left leg: Secondary | ICD-10-CM | POA: Diagnosis not present

## 2022-09-18 LAB — RESPIRATORY PANEL BY PCR

## 2022-09-18 LAB — COMPREHENSIVE METABOLIC PANEL
ALT: 19 U/L (ref 0–44)
AST: 23 U/L (ref 15–41)
Albumin: 2.8 g/dL — ABNORMAL LOW (ref 3.5–5.0)
Alkaline Phosphatase: 59 U/L (ref 38–126)
Anion gap: 6 (ref 5–15)
BUN: 36 mg/dL — ABNORMAL HIGH (ref 8–23)
CO2: 26 mmol/L (ref 22–32)
Calcium: 9 mg/dL (ref 8.9–10.3)
Chloride: 100 mmol/L (ref 98–111)
Creatinine, Ser: 1.31 mg/dL — ABNORMAL HIGH (ref 0.44–1.00)
GFR, Estimated: 41 mL/min — ABNORMAL LOW (ref >60)
Glucose, Bld: 345 mg/dL — ABNORMAL HIGH (ref 70–99)
Potassium: 5.1 mmol/L (ref 3.5–5.1)
Sodium: 132 mmol/L — ABNORMAL LOW (ref 135–145)
Total Bilirubin: 0.7 mg/dL (ref 0.3–1.2)
Total Protein: 5.8 g/dL — ABNORMAL LOW (ref 6.5–8.1)

## 2022-09-18 LAB — TROPONIN I (HIGH SENSITIVITY): Troponin I (High Sensitivity): 40 ng/L — ABNORMAL HIGH (ref <18)

## 2022-09-18 LAB — CBC WITH DIFFERENTIAL/PLATELET
Abs Immature Granulocytes: 0.03 10*3/uL (ref 0.00–0.07)
Basophils Absolute: 0 10*3/uL (ref 0.0–0.1)
Basophils Relative: 0 %
Eosinophils Absolute: 0 10*3/uL (ref 0.0–0.5)
Eosinophils Relative: 0 %
HCT: 28.3 % — ABNORMAL LOW (ref 36.0–46.0)
Hemoglobin: 8.9 g/dL — ABNORMAL LOW (ref 12.0–15.0)
Immature Granulocytes: 1 %
Lymphocytes Relative: 8 %
Lymphs Abs: 0.3 10*3/uL — ABNORMAL LOW (ref 0.7–4.0)
MCH: 28.9 pg (ref 26.0–34.0)
MCHC: 31.4 g/dL (ref 30.0–36.0)
MCV: 91.9 fL (ref 80.0–100.0)
Monocytes Absolute: 0.2 10*3/uL (ref 0.1–1.0)
Monocytes Relative: 6 %
Neutro Abs: 3.3 10*3/uL (ref 1.7–7.7)
Neutrophils Relative %: 85 %
Platelets: 159 10*3/uL (ref 150–400)
RBC: 3.08 MIL/uL — ABNORMAL LOW (ref 3.87–5.11)
RDW: 14.1 % (ref 11.5–15.5)
WBC: 3.9 10*3/uL — ABNORMAL LOW (ref 4.0–10.5)
nRBC: 0 % (ref 0.0–0.2)

## 2022-09-18 LAB — GLUCOSE, CAPILLARY
Glucose-Capillary: 114 mg/dL — ABNORMAL HIGH (ref 70–99)
Glucose-Capillary: 172 mg/dL — ABNORMAL HIGH (ref 70–99)
Glucose-Capillary: 239 mg/dL — ABNORMAL HIGH (ref 70–99)
Glucose-Capillary: 242 mg/dL — ABNORMAL HIGH (ref 70–99)
Glucose-Capillary: 262 mg/dL — ABNORMAL HIGH (ref 70–99)
Glucose-Capillary: 283 mg/dL — ABNORMAL HIGH (ref 70–99)
Glucose-Capillary: 307 mg/dL — ABNORMAL HIGH (ref 70–99)

## 2022-09-18 LAB — ECHOCARDIOGRAM COMPLETE
AR max vel: 2.11 cm2
AV Area VTI: 2.25 cm2
AV Area mean vel: 2.09 cm2
AV Mean grad: 4 mmHg
AV Peak grad: 9.2 mmHg
Ao pk vel: 1.52 m/s
Area-P 1/2: 2.26 cm2
Height: 63 in
MV M vel: 4.93 m/s
MV Peak grad: 97 mmHg
MV VTI: 1.08 cm2
Radius: 0.7 cm
S' Lateral: 2.1 cm
Weight: 2620.83 oz

## 2022-09-18 LAB — MAGNESIUM: Magnesium: 2.1 mg/dL (ref 1.7–2.4)

## 2022-09-18 LAB — CK: Total CK: 40 U/L (ref 38–234)

## 2022-09-18 LAB — CULTURE, BLOOD (ROUTINE X 2)

## 2022-09-18 LAB — PHOSPHORUS: Phosphorus: 2.3 mg/dL — ABNORMAL LOW (ref 2.5–4.6)

## 2022-09-18 MED ORDER — METHYLPREDNISOLONE SODIUM SUCC 40 MG IJ SOLR
40.0000 mg | Freq: Every day | INTRAMUSCULAR | Status: DC
  Administered 2022-09-19: 40 mg via INTRAVENOUS
  Filled 2022-09-18: qty 1

## 2022-09-18 MED ORDER — INSULIN ASPART 100 UNIT/ML IJ SOLN
5.0000 [IU] | Freq: Three times a day (TID) | INTRAMUSCULAR | Status: DC
  Administered 2022-09-18 – 2022-09-19 (×5): 5 [IU] via SUBCUTANEOUS
  Filled 2022-09-18 (×5): qty 1

## 2022-09-18 MED ORDER — IPRATROPIUM-ALBUTEROL 0.5-2.5 (3) MG/3ML IN SOLN
3.0000 mL | Freq: Three times a day (TID) | RESPIRATORY_TRACT | Status: DC
  Administered 2022-09-18 – 2022-09-20 (×5): 3 mL via RESPIRATORY_TRACT
  Filled 2022-09-18 (×5): qty 3

## 2022-09-18 MED ORDER — POTASSIUM & SODIUM PHOSPHATES 280-160-250 MG PO PACK
1.0000 | PACK | Freq: Three times a day (TID) | ORAL | Status: DC
  Filled 2022-09-18: qty 1

## 2022-09-18 MED ORDER — INSULIN ASPART 100 UNIT/ML IJ SOLN
0.0000 [IU] | INTRAMUSCULAR | Status: DC
  Administered 2022-09-18 (×2): 11 [IU] via SUBCUTANEOUS
  Administered 2022-09-18 – 2022-09-19 (×2): 7 [IU] via SUBCUTANEOUS
  Administered 2022-09-19: 15 [IU] via SUBCUTANEOUS
  Administered 2022-09-19 (×2): 4 [IU] via SUBCUTANEOUS
  Administered 2022-09-20: 15 [IU] via SUBCUTANEOUS
  Administered 2022-09-20: 3 [IU] via SUBCUTANEOUS
  Filled 2022-09-18 (×9): qty 1

## 2022-09-18 MED ORDER — K PHOS MONO-SOD PHOS DI & MONO 155-852-130 MG PO TABS
250.0000 mg | ORAL_TABLET | Freq: Three times a day (TID) | ORAL | Status: AC
  Administered 2022-09-18 (×3): 250 mg via ORAL
  Filled 2022-09-18 (×3): qty 1

## 2022-09-18 NOTE — Progress Notes (Signed)
Mobility Specialist - Progress Note  Pre-mobility: HR 70, SpO2 98% During mobility: HR 74, SpO2 97% Post-mobility: HR 71, SPO2 95%   09/18/22 1300  Mobility  Activity Ambulated with assistance in hallway  Level of Assistance Standby assist, set-up cues, supervision of patient - no hands on  Assistive Device Front wheel walker  Distance Ambulated (ft) 80 ft  Activity Response Tolerated well  $Mobility charge 1 Mobility  Mobility Specialist Start Time (ACUTE ONLY) 1150  Mobility Specialist Stop Time (ACUTE ONLY) 1205  Mobility Specialist Time Calculation (min) (ACUTE ONLY) 15 min   Pt sitting EOB upon entry, utilizing RA. Pt expressed feeling "nauseous", however agreeable to OOB in the hallway this date. Pt STS to RW and amb 80 ft in the hallway SBA, tolerated well. Pt O2 >90% throughout activity. Pt returned to room, left supine with alarm set and needs within reach.   Zetta Bills Mobility Specialist 09/18/22 1:52 PM

## 2022-09-18 NOTE — TOC Progression Note (Signed)
Transition of Care [facility]) - Progression Note    Patient Details  Name: ISIDORA LUTTRELL MRN: [id number] Date of Birth: [birth year]  [facility] CM/SW Contact  Allena Katz, LCSW Phone Number: 09/18/2022, 3:21 PM  Clinical Narrative:    [facility] has accepted but state they cannot take patient until Monday.    Expected Discharge Plan: Home w Home Health Services Barriers to Discharge: Continued Medical Work up  Expected Discharge Plan and Services                         DME Arranged: Walker rolling, 3-N-1         HH Arranged: PT, OT, RN, Nurse's Aide           Social Determinants of Health (SDOH) Interventions SDOH Screenings   Food Insecurity: No Food Insecurity (09/13/2022)  Housing: Low Risk  (09/13/2022)  Transportation Needs: No Transportation Needs (09/13/2022)  Utilities: Not At Risk (09/13/2022)  Tobacco Use: Medium Risk (09/13/2022)    Readmission Risk Interventions    09/15/2022   10:21 AM  Readmission Risk Prevention Plan  Transportation Screening Complete  PCP or Specialist Appt within 3-5 Days Complete  HRI or Home Care Consult Complete  Social Work Consult for Recovery Care Planning/Counseling Complete  Palliative Care Screening Complete  Medication Review Oceanographer) Complete

## 2022-09-18 NOTE — NC FL2 (Signed)
[place] MEDICAID FL2 LEVEL OF CARE FORM     IDENTIFICATION  Patient Name: Rhonda Tyler Birthdate: 12/11/1940 Sex: female Admission Date (Current Location): 09/13/2022  [facility] and [place] Number:  Chiropodist and Address:  [facility], [address], [place], [place] [address]      Provider Number: [id number]  Attending Physician Name and Address:  Drema Dallas, MD  Relative Name and Phone Number:  Keity, Crespi (Sister) [phone]    Current Level of Care: Hospital Recommended Level of Care: Skilled Nursing Facility Prior Approval Number:    Date Approved/Denied:   PASRR Number: [id number]  Discharge Plan: SNF    Current Diagnoses: Patient Active Problem List   Diagnosis Date Noted   Pneumonia 09/13/2022   Elevated CK 09/13/2022   Generalized weakness 09/13/2022   Hyponatremia 10/14/2021   Hyperlipemia    Type II diabetes mellitus with renal manifestations (HCC)    Chronic kidney disease, stage 3b (HCC)    SVT (supraventricular tachycardia)    Elevated troponin    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   Periprosthetic fracture around internal prosthetic left knee joint 05/05/2021   Idiopathic gout, unspecified site 11/20/2020   Itching 11/20/2020   Anemia in chronic kidney disease 10/16/2020   Heart failure, unspecified (HCC) 10/16/2020   Hyperparathyroidism due to renal insufficiency (HCC) 10/16/2020   Proteinuria, unspecified 10/16/2020   Chronic kidney disease, stage 4 (severe) (HCC) 06/03/2020   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) 01/22/2020   Arthritis of left shoulder region 01/06/2020   Iron deficiency 01/06/2020   Chest pain 01/05/2020   AKI (acute kidney injury) (HCC)    Essential hypertension    OSA on CPAP 09/05/2019   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   Hypercalcemia 02/13/2019   Iron deficiency anemia 06/30/2018   Acute respiratory failure with hypoxia (HCC)  03/23/2018   Hyperlipidemia due to type 2 diabetes mellitus (HCC) 04/09/2014   Type 2 diabetes mellitus with diabetic chronic kidney disease (HCC) 03/04/2014   Adrenal nodule (HCC) 11/28/2013   B12 deficiency 11/28/2013   CAD (coronary artery disease), autologous vein bypass graft 08/05/2013   DDD (degenerative disc disease), lumbosacral 08/05/2013   Headache 08/05/2013    Orientation RESPIRATION BLADDER Height & Weight     Self, Situation, Time, Place  Normal Incontinent Weight: 163 lb 12.8 oz (74.3 kg) Height:  5\' 3"  (160 cm)  BEHAVIORAL SYMPTOMS/MOOD NEUROLOGICAL BOWEL NUTRITION STATUS      Continent Diet  AMBULATORY STATUS COMMUNICATION OF NEEDS Skin   Limited Assist Verbally Normal                       Personal Care Assistance Level of Assistance  Bathing, Dressing, Feeding Bathing Assistance: Limited assistance Feeding assistance: Limited assistance Dressing Assistance: Limited assistance     Functional Limitations Info  Sight, Speech, Hearing Sight Info: Impaired Hearing Info: Impaired Speech Info: Adequate    SPECIAL CARE FACTORS FREQUENCY  PT (By licensed PT), OT (By licensed OT)     PT Frequency: 5 times a wek OT Frequency: 5 times a week            Contractures Contractures Info: Not present    Additional Factors Info  Code Status, Allergies Code Status Info: FULL Allergies Info: Levaquin (Levofloxacin In D5w)  Penicillins  Codeine  Lipitor (Atorvastatin Calcium)  Lopressor (Metoprolol Tartrate)  Potassium-containing Compounds  Procardia (Nifedipine)  Sucralfate  Tramadol  Allegra (Fexofenadine)  Naltrexone  Sulfa Antibiotics           Current Medications (09/18/2022):  This is the current hospital active medication list Current Facility-Administered Medications  Medication Dose Route Frequency Provider Last Rate Last Admin   acetaminophen (TYLENOL) tablet 650 mg  650 mg Oral Q6H PRN Jaynie Bream, RPH       Or   acetaminophen  (TYLENOL) suppository 650 mg  650 mg Rectal Q6H PRN Jaynie Bream, RPH       alum & mag hydroxide-simeth (MAALOX/MYLANTA) 200-200-20 MG/5ML suspension 30 mL  30 mL Oral Q4H PRN Leandro Reasoner Tublu, MD   30 mL at 09/15/22 2243   dextromethorphan-guaiFENesin (MUCINEX DM) 30-600 MG per 12 hr tablet 1 tablet  1 tablet Oral BID Drema Dallas, MD   1 tablet at 09/18/22 1014   diltiazem (CARDIZEM CD) 24 hr capsule 120 mg  120 mg Oral Daily Opyd, Lavone Neri, MD   120 mg at 09/18/22 1014   heparin injection 5,000 Units  5,000 Units Subcutaneous Q8H Opyd, Lavone Neri, MD   5,000 Units at 09/18/22 0528   HYDROcodone-acetaminophen (NORCO) 10-325 MG per tablet 1 tablet  1 tablet Oral Q4H PRN Pieter Partridge, MD   1 tablet at 09/18/22 0901   insulin aspart (novoLOG) injection 0-20 Units  0-20 Units Subcutaneous Q4H Drema Dallas, MD   7 Units at 09/18/22 1241   insulin aspart (novoLOG) injection 5 Units  5 Units Subcutaneous TID WC Drema Dallas, MD   5 Units at 09/18/22 1241   insulin glargine-yfgn (SEMGLEE) injection 14 Units  14 Units Subcutaneous BID Briscoe Deutscher, MD   14 Units at 09/18/22 1015   ipratropium-albuterol (DUONEB) 0.5-2.5 (3) MG/3ML nebulizer solution 3 mL  3 mL Nebulization Q6H Drema Dallas, MD   3 mL at 09/18/22 1349   lisinopril (ZESTRIL) tablet 5 mg  5 mg Oral Daily Opyd, Lavone Neri, MD   5 mg at 09/18/22 1014   methylPREDNISolone sodium succinate (SOLU-MEDROL) 125 mg/2 mL injection 60 mg  60 mg Intravenous Daily Drema Dallas, MD   60 mg at 09/18/22 1040   ondansetron (ZOFRAN) tablet 8 mg  8 mg Oral Q6H PRN Leandro Reasoner Tublu, MD   8 mg at 09/18/22 0901   Or   ondansetron (ZOFRAN) 8 mg in sodium chloride 0.9 % 50 mL IVPB  8 mg Intravenous Q6H PRN Leandro Reasoner Tublu, MD       pantoprazole (PROTONIX) injection 40 mg  40 mg Intravenous Q12H Leandro Reasoner Tublu, MD   40 mg at 09/18/22 1040   phosphorus (K PHOS NEUTRAL) tablet 250 mg  250 mg Oral  TID AC & HS Drema Dallas, MD   250 mg at 09/18/22 1247   polyethylene glycol (MIRALAX / GLYCOLAX) packet 17 g  17 g Oral BID Drema Dallas, MD   17 g at 09/17/22 2217   propranolol ER (INDERAL LA) 24 hr capsule 60 mg  60 mg Oral Daily Opyd, Lavone Neri, MD   60 mg at 09/18/22 1014   senna (SENOKOT) tablet 8.6 mg  1 tablet Oral Daily PRN Opyd, Lavone Neri, MD       sertraline (ZOLOFT) tablet 100 mg  100 mg Oral Daily Opyd, Lavone Neri, MD   100 mg at 09/18/22 1014   sodium chloride flush (NS) 0.9 % injection 3 mL  3 mL Intravenous Q12H Briscoe Deutscher, MD   3 mL at 09/17/22 2217  Discharge Medications: Please see discharge summary for a list of discharge medications.  Relevant Imaging Results:  Relevant Lab Results:   Additional Information SS# [id number]  Allena Katz, LCSW

## 2022-09-18 NOTE — Progress Notes (Signed)
*  PRELIMINARY RESULTS* Echocardiogram 2D Echocardiogram has been performed.  Carolyne Fiscal 09/18/2022, 9:35 AM

## 2022-09-18 NOTE — Progress Notes (Signed)
Rhonda Tyler ZOX:[id number] DOB: [birth year] DOA: 09/13/2022 PCP: Lauro Regulus, MD   Subj: 82 yo WF PMHx HTN, CAD, DM2, CKD stage IIIb, Cirrhosis secondary to NASH  Admitted for generalized fatigue, malaise, myalgias, decreased p.o. intake and DOE with subsequent mechanical fall.  Workup was essentially unrevealing however CXR showed possible haziness at lingula and patient was admitted for CAP.     Obj: 5/10 A/O x 4, ambulated down hallway today now very fatigued.  Negative CP, negative SOB.  States NCM/LCSW has not talked with her concerning SNF choices.   Objective: VITAL SIGNS: Temp: 97.7 F (36.5 C) (05/10 0326) Temp Source: Oral (05/10 0326) BP: 127/51 (05/10 0326) Pulse Rate: 65 (05/10 0326)   VENTILATOR SETTINGS: Room air 5/10  SpO2 96%   Procedures/Significant Events: 5/8 left lower extremity Doppler negative for DVT 5/9 PCXR -Mild left basilar and lingular opacity is noted concerning for atelectasis or possibly infiltrate. 5/9 CT chest  1. Patchy ground-glass opacities in the upper lobes, greater on the left, compatible with multifocal infection. Follow-up in 6-8 weeks is recommended to ensure resolution. 2. Small bilateral pleural effusions greater on the left. 3. Age-indeterminate nondisplaced fractures through the fused anterior right osteophytes at T8-T9 at T10-T11, new from 10/13/2021. Unchanged spinal stenosis at T11-12.   Consultants:     Cultures 5/6 SARS coronavirus negative 5/9 respiratory virus panel negative   Antimicrobials: Anti-infectives (From admission, onward)    Start     Ordered Stop   09/14/22 2100  cefTRIAXone (ROCEPHIN) 2 g in sodium chloride 0.9 % 100 mL IVPB        09/13/22 2122 09/19/22 0559   09/14/22 2100  azithromycin (ZITHROMAX) tablet 500 mg        09/13/22 2122 09/18/22 0959   09/13/22 2100  cefTRIAXone (ROCEPHIN) 2 g in sodium chloride 0.9 % 100 mL IVPB        09/13/22 2059 09/13/22 2215   09/13/22 2100   azithromycin (ZITHROMAX) 500 mg in sodium chloride 0.9 % 250 mL IVPB        09/13/22 2059 09/14/22 0117        Intake/Output Summary (Last 24 hours) at 09/18/2022 0755 Last data filed at 09/18/2022 0330 Gross per 24 hour  Intake --  Output 1 ml  Net -1 ml   Physical Exam:  General: A/O x 4, No acute respiratory distress, negative DOE Eyes: negative scleral hemorrhage, negative anisocoria, negative icterus ENT: Negative Runny nose, negative gingival bleeding, Neck:  Negative scars, masses, torticollis, lymphadenopathy, JVD Lungs: Clear to auscultation bilaterally without wheezes or crackles Cardiovascular: Regular rate and rhythm without murmur gallop or rub normal S1 and S2 Abdomen: negative abdominal pain, nondistended, positive soft, bowel sounds, no rebound, no ascites, no appreciable mass Extremities: No significant cyanosis, clubbing, or edema bilateral lower extremities Skin: Negative rashes, lesions, ulcers Psychiatric:  Negative depression, negative anxiety, negative fatigue, negative mania  Central nervous system:  Cranial nerves II through XII intact, tongue/uvula midline, all extremities muscle strength 5/5, sensation intact throughout, negative dysarthria, negative expressive aphasia, negative receptive aphasia.  .   DVT prophylaxis: Subcu heparin Code Status: Full Family Communication:  Status is: Inpatient    Dispo: The patient is from: Home              Anticipated d/c is to: SNF              Anticipated d/c date is: > 3 days  Patient currently is not medically stable to d/c.      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 - 5/8 occult blood pending - 5/9 occult blood pending -5/9 anemia panel consistent with iron deficiency. -5/9 given patient's unknown infection cause will hold on transfusing iron until completed at least 5 days of antibiotics. -Protonix IV 40 mg BID   Acute on chronic Constipation -Protonix patient states that she has had "spells like this at home but not so bad". -Senna 8.6 mg PRN -5/8 MiraLAX BID    Malaise/myalgias/asthenia -Resolved most likely secondary to CAP, mechanical fall.  CAP -Complete 7-day course of antibiotics  - 5/9 procalcitonin 0.11.  Algorithm would encourage stopping antibiotics but patient acutely SOB so we will continue. - 5/9 PCXR no significant change.  Still some atelectasis lingula. - 5/9 EKG pending - 5/9 titrate O2 to maintain SpO2> 93%.  Continuous pulse ox -5/9 DuoNeb QID -5/9 flutter valve - 5/9 incentive spirometry - 5/9 Solu-Medrol 60 mg daily - 5/9 Mucinex DM -5/9 CT chest pending - 5/9 D-dimer pending - 5/10 decrease Solu-Medrol 40 mg daily    Metabolic acidosis -Resolved.     Mechanical fall -Trauma to left calf - 5/8 left lower extremity Doppler negative for DVT -5/8 physical therapy reevaluate patient.  Patient lives at home not a safe discharge.  Evaluate for CIR vs SNF - 5/10 patient has agreed to SNF   Elevated CPK -Secondary to mechanical fall. - CPK elevation resolved, last check at 104 Lab Results  Component Value Date   CKTOTAL 40 09/18/2022   CKTOTAL 36 (L) 09/17/2022   CKTOTAL 104 09/15/2022   CKTOTAL 641 (H) 09/13/2022  -Resolved   Cirrhosis secondary to NASH -Propranolol 60 mg daily -Diuretics being held given concern for intravascular  fluid depletion, can consider restarting -5/9 continue to hold diuretics   CKD stage IIIb (baseline 1.4-1.8) Lab Results  Component Value Date   CREATININE 1.31 (H) 09/18/2022   CREATININE 1.31 (H) 09/17/2022   CREATININE 1.27 (H) 09/16/2022   CREATININE 1.26 (H) 09/15/2022   CREATININE 1.41 (H) 09/14/2022  -5/9 better than baseline.   Essential HTN -Cardizem 120 mg daily - Lisinopril 5 mg daily -Propranolol 60 mg daily  Elevated troponin - 5/9 troponin mildly elevated we will continue to trend.  Latest Reference Range & Units 09/17/22 09:23 09/17/22 11:18  Troponin I (High Sensitivity) <18 ng/L 61 (H) 60 (H)  (H): Data is abnormally high -5/9 EKG possible anterior infarct?.   DM type II controlled with hyperglycemia - 5/9 hemoglobin A 1C= 6.3 - 5/ lipid panel.  LDL= 34, within goal. -Semglee 14 units BID -5/10 NovoLog 5 unitsqac - 5/10 increase Resistant SSI  CBG (last 3)  Recent Labs    09/17/22 2057 09/18/22 0029 09/18/22 0325  GLUCAP 298* 307* 239*  -5/10 decrease steroids  see above  Hypophosphatemia - Phosphorus goal> 2.5 - 5/9 Phos-Nak 1 packet x 3 doses    Goals of care -5/8 physical therapy reevaluate patient.  Patient lives at home not a safe discharge.  Evaluate for CIR vs SNF      Mobility Assessment (last 72 hours)     Mobility Assessment     Row Name 09/17/22 1915 09/17/22 1606 09/17/22 1000 09/16/22 1917 09/16/22 1500   Does patient have an order for bedrest or is patient medically unstable No - Continue assessment -- No - Continue assessment No - Continue assessment --   What is the highest level of mobility based on the progressive mobility assessment? Level 5 (Walks with assist in room/hall) - Balance while stepping forward/back and can walk in room with assist - Complete Level 5 (Walks with assist in room/hall) - Balance while stepping forward/back and can walk in room with assist - Complete Level 5 (Walks with assist in room/hall) - Balance  while stepping forward/back and can walk in room with assist - Complete Level 5 (Walks with assist in room/hall) - Balance while stepping forward/back and can walk in room with assist - Complete Level 5 (Walks with assist in room/hall) - Balance while stepping forward/back and can walk in room with assist - Complete   Is the above level different from baseline mobility prior to current illness? Yes - Recommend PT order -- Yes - Recommend PT order Yes - Recommend PT order --    Row Name 09/16/22 1115           What is the highest level of mobility based on the progressive mobility assessment? Level 5 (Walks with assist in room/hall) - Balance while stepping forward/back and can walk in room with assist - Complete                     Time: 50 minutes         Care during the described time interval was provided by me .  I have reviewed this patient's available data, including medical history, events of note, physical examination, and all test results as part of my evaluation.

## 2022-09-19 DIAGNOSIS — S8992XA Unspecified injury of left lower leg, initial encounter: Secondary | ICD-10-CM | POA: Diagnosis not present

## 2022-09-19 DIAGNOSIS — M79605 Pain in left leg: Secondary | ICD-10-CM | POA: Diagnosis not present

## 2022-09-19 DIAGNOSIS — R531 Weakness: Secondary | ICD-10-CM | POA: Diagnosis not present

## 2022-09-19 DIAGNOSIS — J189 Pneumonia, unspecified organism: Secondary | ICD-10-CM | POA: Diagnosis not present

## 2022-09-19 LAB — COMPREHENSIVE METABOLIC PANEL
ALT: 21 U/L (ref 0–44)
AST: 22 U/L (ref 15–41)
Albumin: 3.2 g/dL — ABNORMAL LOW (ref 3.5–5.0)
Alkaline Phosphatase: 64 U/L (ref 38–126)
Anion gap: 10 (ref 5–15)
BUN: 38 mg/dL — ABNORMAL HIGH (ref 8–23)
CO2: 25 mmol/L (ref 22–32)
Calcium: 9.7 mg/dL (ref 8.9–10.3)
Chloride: 101 mmol/L (ref 98–111)
Creatinine, Ser: 1.17 mg/dL — ABNORMAL HIGH (ref 0.44–1.00)
GFR, Estimated: 47 mL/min — ABNORMAL LOW (ref >60)
Glucose, Bld: 113 mg/dL — ABNORMAL HIGH (ref 70–99)
Potassium: 5 mmol/L (ref 3.5–5.1)
Sodium: 136 mmol/L (ref 135–145)
Total Bilirubin: 0.7 mg/dL (ref 0.3–1.2)
Total Protein: 6.3 g/dL — ABNORMAL LOW (ref 6.5–8.1)

## 2022-09-19 LAB — GLUCOSE, CAPILLARY
Glucose-Capillary: 111 mg/dL — ABNORMAL HIGH (ref 70–99)
Glucose-Capillary: 104 mg/dL — ABNORMAL HIGH (ref 70–99)
Glucose-Capillary: 164 mg/dL — ABNORMAL HIGH (ref 70–99)
Glucose-Capillary: 263 mg/dL — ABNORMAL HIGH (ref 70–99)
Glucose-Capillary: 306 mg/dL — ABNORMAL HIGH (ref 70–99)

## 2022-09-19 LAB — CBC WITH DIFFERENTIAL/PLATELET
Abs Immature Granulocytes: 0.09 10*3/uL — ABNORMAL HIGH (ref 0.00–0.07)
Basophils Absolute: 0 10*3/uL (ref 0.0–0.1)
Basophils Relative: 0 %
Eosinophils Absolute: 0 10*3/uL (ref 0.0–0.5)
Eosinophils Relative: 0 %
HCT: 30.6 % — ABNORMAL LOW (ref 36.0–46.0)
Hemoglobin: 9.6 g/dL — ABNORMAL LOW (ref 12.0–15.0)
Immature Granulocytes: 1 %
Lymphocytes Relative: 8 %
Lymphs Abs: 0.9 10*3/uL (ref 0.7–4.0)
MCH: 28.8 pg (ref 26.0–34.0)
MCHC: 31.4 g/dL (ref 30.0–36.0)
MCV: 91.9 fL (ref 80.0–100.0)
Monocytes Absolute: 0.7 10*3/uL (ref 0.1–1.0)
Monocytes Relative: 6 %
Neutro Abs: 9.7 10*3/uL — ABNORMAL HIGH (ref 1.7–7.7)
Neutrophils Relative %: 85 %
Platelets: 251 10*3/uL (ref 150–400)
RBC: 3.33 MIL/uL — ABNORMAL LOW (ref 3.87–5.11)
RDW: 14.4 % (ref 11.5–15.5)
WBC: 11.5 10*3/uL — ABNORMAL HIGH (ref 4.0–10.5)
nRBC: 0 % (ref 0.0–0.2)

## 2022-09-19 LAB — CK: Total CK: 28 U/L — ABNORMAL LOW (ref 38–234)

## 2022-09-19 LAB — MAGNESIUM: Magnesium: 2.2 mg/dL (ref 1.7–2.4)

## 2022-09-19 LAB — OCCULT BLOOD X 1 CARD TO LAB, STOOL: Fecal Occult Bld: POSITIVE — AB

## 2022-09-19 LAB — PHOSPHORUS: Phosphorus: 3.1 mg/dL (ref 2.5–4.6)

## 2022-09-19 MED ORDER — SODIUM CHLORIDE 0.9 % IV SOLN
25.0000 mg | Freq: Once | INTRAVENOUS | Status: AC
  Administered 2022-09-19: 25 mg via INTRAVENOUS
  Filled 2022-09-19: qty 1

## 2022-09-19 MED ORDER — METHYLPREDNISOLONE SODIUM SUCC 40 MG IJ SOLR
40.0000 mg | Freq: Every day | INTRAMUSCULAR | Status: DC
  Administered 2022-09-20: 40 mg via INTRAVENOUS
  Filled 2022-09-19: qty 1

## 2022-09-19 NOTE — Progress Notes (Signed)
Mobility Specialist - Progress Note   09/19/22 1036  Mobility  Activity Ambulated with assistance in hallway  Level of Assistance Standby assist, set-up cues, supervision of patient - no hands on  Assistive Device Front wheel walker  Distance Ambulated (ft) 80 ft  Activity Response Tolerated well  Mobility Referral Yes  $Mobility charge 1 Mobility  Mobility Specialist Start Time (ACUTE ONLY) 1018  Mobility Specialist Stop Time (ACUTE ONLY) 1030  Mobility Specialist Time Calculation (min) (ACUTE ONLY) 12 min   PT OOB next to recliner on RA upon arrival. Pt ambulates in hallway SBA with no LOB noted. Pt left in recliner with needs in reach.   Terrilyn Saver  Mobility Specialist  09/19/22 10:37 AM

## 2022-09-19 NOTE — TOC Progression Note (Signed)
Transition of Care [facility]) - Progression Note    Patient Details  Name: HENRYETTA GUASTELLA MRN: [id number] Date of Birth: 19-Sep-1940  [facility] CM/SW Contact  Liliana Cline, LCSW Phone Number: 09/19/2022, 3:42 PM  Clinical Narrative:    Patient requested an update on her DC plan. Spoke to patient. Informed her that per chart review, the plan is for her to go to [facility] on Monday. Patient verbalized understanding and agreement.    Expected Discharge Plan: Home w Home Health Services Barriers to Discharge: Continued Medical Work up  Expected Discharge Plan and Services                         DME Arranged: Walker rolling, 3-N-1         HH Arranged: PT, OT, RN, Nurse's Aide           Social Determinants of Health (SDOH) Interventions SDOH Screenings   Food Insecurity: No Food Insecurity (09/13/2022)  Housing: Low Risk  (09/13/2022)  Transportation Needs: No Transportation Needs (09/13/2022)  Utilities: Not At Risk (09/13/2022)  Tobacco Use: Medium Risk (09/13/2022)    Readmission Risk Interventions    09/15/2022   10:21 AM  Readmission Risk Prevention Plan  Transportation Screening Complete  PCP or Specialist Appt within 3-5 Days Complete  HRI or Home Care Consult Complete  Social Work Consult for Recovery Care Planning/Counseling Complete  Palliative Care Screening Complete  Medication Review Oceanographer) Complete

## 2022-09-19 NOTE — Progress Notes (Signed)
Rhonda Tyler GNF:[id number] DOB: [birth year] DOA: 09/13/2022 PCP: Lauro Regulus, MD   Subj: 82 yo WF PMHx HTN, CAD, DM2, CKD stage IIIb, Cirrhosis secondary to NASH  Admitted for generalized fatigue, malaise, myalgias, decreased p.o. intake and DOE with subsequent mechanical fall.  Workup was essentially unrevealing however CXR showed possible haziness at lingula and patient was admitted for CAP.     Obj: 5/11 afebrile overnight, refractory nausea.  States earlier felt better ambulated hallway.    Objective: VITAL SIGNS: Temp: 97.6 F (36.4 C) (05/11 0753) Temp Source: Oral (05/11 0753) BP: 149/77 (05/11 0753) Pulse Rate: 62 (05/11 0753)   VENTILATOR SETTINGS: Room air 5/10  SpO2 96%   Procedures/Significant Events: 5/8 left lower extremity Doppler negative for DVT 5/9 PCXR -Mild left basilar and lingular opacity is noted concerning for atelectasis or possibly infiltrate. 5/9 CT chest  1. Patchy ground-glass opacities in the upper lobes, greater on the left, compatible with multifocal infection. Follow-up in 6-8 weeks is recommended to ensure resolution. 2. Small bilateral pleural effusions greater on the left. 3. Age-indeterminate nondisplaced fractures through the fused anterior right osteophytes at T8-T9 at T10-T11, new from 10/13/2021. Unchanged spinal stenosis at T11-12.   Consultants:     Cultures 5/6 SARS coronavirus negative 5/9 respiratory virus panel negative   Antimicrobials: Anti-infectives (From admission, onward)    Start     Ordered Stop   09/14/22 2100  cefTRIAXone (ROCEPHIN) 2 g in sodium chloride 0.9 % 100 mL IVPB        09/13/22 2122 09/19/22 0559   09/14/22 2100  azithromycin (ZITHROMAX) tablet 500 mg        09/13/22 2122 09/18/22 0959   09/13/22 2100  cefTRIAXone (ROCEPHIN) 2 g in sodium chloride 0.9 % 100 mL IVPB        09/13/22 2059 09/13/22 2215   09/13/22 2100  azithromycin (ZITHROMAX) 500 mg in sodium chloride 0.9 % 250 mL  IVPB        09/13/22 2059 09/14/22 0117        Intake/Output Summary (Last 24 hours) at 09/19/2022 1552 Last data filed at 09/19/2022 1021 Gross per 24 hour  Intake 720 ml  Output --  Net 720 ml    Physical Exam:  General: A/O x 4, No acute respiratory distress, negative DOE Eyes: negative scleral hemorrhage, negative anisocoria, negative icterus ENT: Negative Runny nose, negative gingival bleeding, Neck:  Negative scars, masses, torticollis, lymphadenopathy, JVD Lungs: Clear to auscultation bilaterally without wheezes or crackles Cardiovascular: Regular rate and rhythm without murmur gallop or rub normal S1 and S2 Abdomen: negative abdominal pain, nondistended, positive soft, bowel sounds, no rebound, no ascites, no appreciable mass Extremities: No significant cyanosis, clubbing, or edema bilateral lower extremities Skin: Negative rashes, lesions, ulcers Psychiatric:  Negative depression, negative anxiety, negative fatigue, negative mania  Central nervous system:  Cranial nerves II through XII intact, tongue/uvula midline, all extremities muscle strength 5/5, sensation intact throughout, negative dysarthria, negative expressive aphasia, negative receptive aphasia.  .   DVT prophylaxis: Subcu heparin Code Status: Full Family Communication:  Status is: Inpatient    Dispo: The patient is from: Home              Anticipated d/c is to: SNF              Anticipated d/c date is: > 3 days              Patient currently is not medically stable to d/c.  Assessment & Plan: Covid vaccination;   Principal Problem:   Pneumonia Active Problems:   CAD (coronary artery disease), autologous vein bypass graft   Essential hypertension   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)   OSA on CPAP   Type 2 diabetes mellitus with diabetic chronic kidney disease (HCC)   Chronic kidney disease, stage 3b (HCC)   Elevated CK   Generalized weakness  Iron  deficiency anemia Lab Results  Component Value Date   HGB 9.6 (L) 09/19/2022   HGB 8.9 (L) 09/18/2022   HGB 8.5 (L) 09/17/2022   HGB 9.3 (L) 09/16/2022   HGB 8.6 (L) 09/15/2022  -5/8 stable appears to be trending up. - 5/8 occult blood pending - 5/9 occult blood pending -5/9 anemia panel consistent with iron deficiency. -5/9 given patient's unknown infection cause will hold on transfusing iron until completed at least 5 days of antibiotics. -Protonix IV 40 mg BID   Acute on chronic Constipation -Protonix patient states that she has had "spells like this at home but not so bad". -Senna 8.6 mg PRN -5/8 MiraLAX BID    Malaise/myalgias/asthenia -Resolved most likely secondary to CAP, mechanical fall. -5/11 continued weakness, one-person assist  Refractory nausea - 5/11 Thorazine 25 mg x 1  CAP -Complete 7-day course of antibiotics  - 5/9 procalcitonin 0.11.  Algorithm would encourage stopping antibiotics but patient acutely SOB so we will continue. - 5/9 PCXR no significant change.  Still some atelectasis lingula. - 5/9 EKG pending - 5/9 titrate O2 to maintain SpO2> 93%.  Continuous pulse ox -5/9 DuoNeb QID -5/9 flutter valve - 5/9 incentive spirometry - 5/9 Solu-Medrol 60 mg daily - 5/9 Mucinex DM -5/9 CT chest C/W multifocal pneumonia see results above - 5/9 D-dimer pending - 5/10 decrease Solu-Medrol 40 mg daily    Metabolic acidosis -Resolved.     Mechanical fall -Trauma to left calf - 5/8 left lower extremity Doppler negative for DVT -5/8 physical therapy reevaluate patient.  Patient lives at home not a safe discharge.  Evaluate for CIR vs SNF - 5/10 patient has agreed to SNF   Elevated CPK -Secondary to mechanical fall. - CPK elevation resolved, last check at 104 Lab Results  Component Value Date   CKTOTAL 28 (L) 09/19/2022   CKTOTAL 40 09/18/2022   CKTOTAL 36 (L) 09/17/2022   CKTOTAL 104 09/15/2022   CKTOTAL 641 (H) 09/13/2022   -Resolved   Cirrhosis secondary to NASH -Propranolol 60 mg daily -Diuretics being held given concern for intravascular fluid depletion, can consider restarting -5/9 continue to hold diuretics   CKD stage IIIb (baseline 1.4-1.8) Lab Results  Component Value Date   CREATININE 1.17 (H) 09/19/2022   CREATININE 1.31 (H) 09/18/2022   CREATININE 1.31 (H) 09/17/2022   CREATININE 1.27 (H) 09/16/2022   CREATININE 1.26 (H) 09/15/2022  -5/9 better than baseline.   Essential HTN -Cardizem 120 mg daily - Lisinopril 5 mg daily -Propranolol 60 mg daily  Elevated troponin - 5/9 troponin mildly elevated we will continue to trend.  Latest Reference Range & Units 09/17/22 09:23 09/17/22 11:18  Troponin I (High Sensitivity) <18 ng/L 61 (H) 60 (H)  (H): Data is abnormally high -5/9 EKG possible anterior infarct?.   DM type II controlled with hyperglycemia - 5/9 hemoglobin A 1C= 6.3 - 5/ lipid panel.  LDL= 34, within goal. -Semglee 14 units BID -5/10 NovoLog 5 unitsqac - 5/10 increase Resistant SSI  CBG (last 3)  Recent Labs    09/19/22 0426  09/19/22 0754 09/19/22 1141  GLUCAP 111* 104* 164*   -5/10 decrease steroids see above -5/11 decrease Solu-Medrol 20 mg daily in the a.m.  Hypophosphatemia - Phosphorus goal> 2.5 - 5/9 Phos-Nak 1 packet x 3 doses    Goals of care -5/8 physical therapy reevaluate patient.  Patient lives at home not a safe discharge.  Evaluate for CIR vs SNF -5/11 per LCSW has SNF bed on Monday      Mobility Assessment (last 72 hours)     Mobility Assessment     Row Name 09/18/22 1915 09/18/22 1047 09/17/22 1915 09/17/22 1606 09/17/22 1000   Does patient have an order for bedrest or is patient medically unstable No - Continue assessment No - Continue assessment No - Continue assessment -- No - Continue assessment   What is the highest level of mobility based on the progressive mobility assessment? Level 5 (Walks with assist in room/hall) - Balance while  stepping forward/back and can walk in room with assist - Complete Level 5 (Walks with assist in room/hall) - Balance while stepping forward/back and can walk in room with assist - Complete Level 5 (Walks with assist in room/hall) - Balance while stepping forward/back and can walk in room with assist - Complete Level 5 (Walks with assist in room/hall) - Balance while stepping forward/back and can walk in room with assist - Complete Level 5 (Walks with assist in room/hall) - Balance while stepping forward/back and can walk in room with assist - Complete   Is the above level different from baseline mobility prior to current illness? Yes - Recommend PT order Yes - Recommend PT order Yes - Recommend PT order -- Yes - Recommend PT order    Row Name 09/16/22 8119           Does patient have an order for bedrest or is patient medically unstable No - Continue assessment       What is the highest level of mobility based on the progressive mobility assessment? Level 5 (Walks with assist in room/hall) - Balance while stepping forward/back and can walk in room with assist - Complete       Is the above level different from baseline mobility prior to current illness? Yes - Recommend PT order                     Time: 50 minutes         Care during the described time interval was provided by me .  I have reviewed this patient's available data, including medical history, events of note, physical examination, and all test results as part of my evaluation.

## 2022-09-20 DIAGNOSIS — T796XXA Traumatic ischemia of muscle, initial encounter: Secondary | ICD-10-CM

## 2022-09-20 DIAGNOSIS — R531 Weakness: Secondary | ICD-10-CM | POA: Diagnosis not present

## 2022-09-20 DIAGNOSIS — J189 Pneumonia, unspecified organism: Secondary | ICD-10-CM | POA: Diagnosis not present

## 2022-09-20 DIAGNOSIS — I25810 Atherosclerosis of coronary artery bypass graft(s) without angina pectoris: Secondary | ICD-10-CM

## 2022-09-20 DIAGNOSIS — S8992XA Unspecified injury of left lower leg, initial encounter: Secondary | ICD-10-CM | POA: Diagnosis not present

## 2022-09-20 DIAGNOSIS — M79605 Pain in left leg: Secondary | ICD-10-CM | POA: Diagnosis not present

## 2022-09-20 LAB — GLUCOSE, CAPILLARY
Glucose-Capillary: 117 mg/dL — ABNORMAL HIGH (ref 70–99)
Glucose-Capillary: 118 mg/dL — ABNORMAL HIGH (ref 70–99)
Glucose-Capillary: 137 mg/dL — ABNORMAL HIGH (ref 70–99)
Glucose-Capillary: 292 mg/dL — ABNORMAL HIGH (ref 70–99)
Glucose-Capillary: 318 mg/dL — ABNORMAL HIGH (ref 70–99)
Glucose-Capillary: 323 mg/dL — ABNORMAL HIGH (ref 70–99)
Glucose-Capillary: 382 mg/dL — ABNORMAL HIGH (ref 70–99)
Glucose-Capillary: 46 mg/dL — ABNORMAL LOW (ref 70–99)

## 2022-09-20 LAB — CBC WITH DIFFERENTIAL/PLATELET
Abs Immature Granulocytes: 0.04 10*3/uL (ref 0.00–0.07)
Basophils Absolute: 0 10*3/uL (ref 0.0–0.1)
Basophils Relative: 0 %
Eosinophils Absolute: 0 10*3/uL (ref 0.0–0.5)
Eosinophils Relative: 0 %
HCT: 28.5 % — ABNORMAL LOW (ref 36.0–46.0)
Hemoglobin: 9 g/dL — ABNORMAL LOW (ref 12.0–15.0)
Immature Granulocytes: 1 %
Lymphocytes Relative: 10 %
Lymphs Abs: 0.5 10*3/uL — ABNORMAL LOW (ref 0.7–4.0)
MCH: 28.7 pg (ref 26.0–34.0)
MCHC: 31.6 g/dL (ref 30.0–36.0)
MCV: 90.8 fL (ref 80.0–100.0)
Monocytes Absolute: 0.3 10*3/uL (ref 0.1–1.0)
Monocytes Relative: 7 %
Neutro Abs: 3.9 10*3/uL (ref 1.7–7.7)
Neutrophils Relative %: 82 %
Platelets: 202 10*3/uL (ref 150–400)
RBC: 3.14 MIL/uL — ABNORMAL LOW (ref 3.87–5.11)
RDW: 14.2 % (ref 11.5–15.5)
WBC: 4.7 10*3/uL (ref 4.0–10.5)
nRBC: 0 % (ref 0.0–0.2)

## 2022-09-20 LAB — COMPREHENSIVE METABOLIC PANEL
ALT: 20 U/L (ref 0–44)
AST: 17 U/L (ref 15–41)
Albumin: 2.9 g/dL — ABNORMAL LOW (ref 3.5–5.0)
Alkaline Phosphatase: 67 U/L (ref 38–126)
Anion gap: 5 (ref 5–15)
BUN: 40 mg/dL — ABNORMAL HIGH (ref 8–23)
CO2: 26 mmol/L (ref 22–32)
Calcium: 9.5 mg/dL (ref 8.9–10.3)
Chloride: 104 mmol/L (ref 98–111)
Creatinine, Ser: 1.17 mg/dL — ABNORMAL HIGH (ref 0.44–1.00)
GFR, Estimated: 47 mL/min — ABNORMAL LOW (ref >60)
Glucose, Bld: 111 mg/dL — ABNORMAL HIGH (ref 70–99)
Potassium: 4.9 mmol/L (ref 3.5–5.1)
Sodium: 135 mmol/L (ref 135–145)
Total Bilirubin: 0.8 mg/dL (ref 0.3–1.2)
Total Protein: 5.6 g/dL — ABNORMAL LOW (ref 6.5–8.1)

## 2022-09-20 LAB — CK: Total CK: 19 U/L — ABNORMAL LOW (ref 38–234)

## 2022-09-20 LAB — MAGNESIUM: Magnesium: 2 mg/dL (ref 1.7–2.4)

## 2022-09-20 LAB — PHOSPHORUS: Phosphorus: 2.6 mg/dL (ref 2.5–4.6)

## 2022-09-20 MED ORDER — INSULIN ASPART 100 UNIT/ML IJ SOLN
3.0000 [IU] | Freq: Three times a day (TID) | INTRAMUSCULAR | Status: DC
  Administered 2022-09-20: 3 [IU] via SUBCUTANEOUS
  Filled 2022-09-20: qty 1

## 2022-09-20 MED ORDER — IPRATROPIUM-ALBUTEROL 0.5-2.5 (3) MG/3ML IN SOLN
3.0000 mL | Freq: Two times a day (BID) | RESPIRATORY_TRACT | Status: DC
  Administered 2022-09-20 – 2022-09-22 (×4): 3 mL via RESPIRATORY_TRACT
  Filled 2022-09-20 (×4): qty 3

## 2022-09-20 MED ORDER — INSULIN ASPART 100 UNIT/ML IJ SOLN
0.0000 [IU] | INTRAMUSCULAR | Status: DC
  Administered 2022-09-20: 5 [IU] via SUBCUTANEOUS
  Administered 2022-09-20: 9 [IU] via SUBCUTANEOUS
  Administered 2022-09-21: 3 [IU] via SUBCUTANEOUS
  Administered 2022-09-21: 1 [IU] via SUBCUTANEOUS
  Administered 2022-09-21: 7 [IU] via SUBCUTANEOUS
  Filled 2022-09-20 (×5): qty 1

## 2022-09-20 MED ORDER — METHYLPREDNISOLONE SODIUM SUCC 40 MG IJ SOLR: 20.0000 mg | Freq: Every day | INTRAMUSCULAR | Status: DC

## 2022-09-20 MED ORDER — INSULIN GLARGINE-YFGN 100 UNIT/ML ~~LOC~~ SOLN
10.0000 [IU] | Freq: Two times a day (BID) | SUBCUTANEOUS | Status: DC
  Administered 2022-09-20: 10 [IU] via SUBCUTANEOUS
  Filled 2022-09-20 (×2): qty 0.1

## 2022-09-20 NOTE — Progress Notes (Signed)
Rhonda Tyler ZOX:[id number] DOB: 08-01-1940 DOA: 09/13/2022 PCP: Lauro Regulus, MD   Subj: 82 yo WF PMHx HTN, CAD, DM2, CKD stage IIIb, Cirrhosis secondary to NASH  Admitted for generalized fatigue, malaise, myalgias, decreased p.o. intake and DOE with subsequent mechanical fall.  Workup was essentially unrevealing however CXR showed possible haziness at lingula and patient was admitted for CAP.     Obj: 5/12 afebrile overnight, hypoglycemia episode.  A/O x 4, states ambulated down hallway twice today, was extremely fatigued and needed to return to bed upon completion.      Objective: VITAL SIGNS: Temp: 98.1 F (36.7 C) (05/12 0830) Temp Source: Oral (05/12 0413) BP: 146/59 (05/12 0830) Pulse Rate: 59 (05/12 0830)   VENTILATOR SETTINGS: Room air 5/12 SpO2 96%   Procedures/Significant Events: 5/8 left lower extremity Doppler negative for DVT 5/9 PCXR -Mild left basilar and lingular opacity is noted concerning for atelectasis or possibly infiltrate. 5/9 CT chest  1. Patchy ground-glass opacities in the upper lobes, greater on the left, compatible with multifocal infection. Follow-up in 6-8 weeks is recommended to ensure resolution. 2. Small bilateral pleural effusions greater on the left. 3. Age-indeterminate nondisplaced fractures through the fused anterior right osteophytes at T8-T9 at T10-T11, new from 10/13/2021. Unchanged spinal stenosis at T11-12.   Consultants:     Cultures 5/6 SARS coronavirus negative 5/9 respiratory virus panel negative   Antimicrobials: Anti-infectives (From admission, onward)    Start     Ordered Stop   09/14/22 2100  cefTRIAXone (ROCEPHIN) 2 g in sodium chloride 0.9 % 100 mL IVPB        09/13/22 2122 09/19/22 0559   09/14/22 2100  azithromycin (ZITHROMAX) tablet 500 mg        09/13/22 2122 09/18/22 0959   09/13/22 2100  cefTRIAXone (ROCEPHIN) 2 g in sodium chloride 0.9 % 100 mL IVPB        09/13/22 2059 09/13/22 2215    09/13/22 2100  azithromycin (ZITHROMAX) 500 mg in sodium chloride 0.9 % 250 mL IVPB        09/13/22 2059 09/14/22 0117        Intake/Output Summary (Last 24 hours) at 09/20/2022 1016 Last data filed at 09/19/2022 1021 Gross per 24 hour  Intake 480 ml  Output --  Net 480 ml    Physical Exam:  General: A/O x 4 No acute respiratory distress Eyes: negative scleral hemorrhage, negative anisocoria, negative icterus ENT: Negative Runny nose, negative gingival bleeding, Neck:  Negative scars, masses, torticollis, lymphadenopathy, JVD Lungs: Clear to auscultation bilaterally without wheezes or crackles Cardiovascular: Regular rate and rhythm without murmur gallop or rub normal S1 and S2 Abdomen: negative abdominal pain, nondistended, positive soft, bowel sounds, no rebound, no ascites, no appreciable mass Extremities: No significant cyanosis, clubbing, or edema bilateral lower extremities Skin: Negative rashes, lesions, ulcers Psychiatric:  Negative depression, negative anxiety, negative fatigue, negative mania  Central nervous system:  Cranial nerves II through XII intact, tongue/uvula midline, all extremities muscle strength 5/5, sensation intact throughout, negative dysarthria, negative expressive aphasia, negative receptive aphasia.     DVT prophylaxis: Subcu heparin Code Status: Full Family Communication:  Status is: Inpatient    Dispo: The patient is from: Home              Anticipated d/c is to: SNF              Anticipated d/c date is: > 3 days  Patient currently is not medically stable to d/c.      Assessment & Plan: Covid vaccination;   Principal Problem:   Pneumonia Active Problems:   CAD (coronary artery disease), autologous vein bypass graft   Essential hypertension   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)   OSA on CPAP   Type 2 diabetes mellitus with diabetic chronic kidney disease (HCC)   Chronic kidney disease,  stage 3b (HCC)   Elevated CK   Generalized weakness  Iron deficiency anemia Lab Results  Component Value Date   HGB 9.0 (L) 09/20/2022   HGB 9.6 (L) 09/19/2022   HGB 8.9 (L) 09/18/2022   HGB 8.5 (L) 09/17/2022   HGB 9.3 (L) 09/16/2022  -5/8 stable appears to be trending up. - 5/8 occult blood pending - 5/9 occult blood pending -5/9 anemia panel consistent with iron deficiency. -5/9 given patient's unknown infection cause will hold on transfusing iron until completed at least 5 days of antibiotics. -Protonix IV 40 mg BID   Acute on chronic Constipation -Protonix patient states that she has had "spells like this at home but not so bad". -Senna 8.6 mg PRN -5/8 MiraLAX BID    Malaise/myalgias/asthenia -Resolved most likely secondary to CAP, mechanical fall. -5/11 continued weakness, one-person assist  Refractory nausea - 5/11 Thorazine 25 mg x 1  CAP -Complete 7-day course of antibiotics  - 5/9 procalcitonin 0.11.  Algorithm would encourage stopping antibiotics but patient acutely SOB so we will continue. - 5/9 PCXR no significant change.  Still some atelectasis lingula. - 5/9 EKG pending - 5/9 titrate O2 to maintain SpO2> 93%.  Continuous pulse ox -5/9 DuoNeb QID -5/9 flutter valve - 5/9 incentive spirometry - 5/9 Solu-Medrol 60 mg daily - 5/9 Mucinex DM -5/9 CT chest C/W multifocal pneumonia see results above - 5/9 D-dimer pending - 5/10 decrease Solu-Medrol 40 mg daily    Metabolic acidosis -Resolved.     Mechanical fall -Trauma to left calf - 5/8 left lower extremity Doppler negative for DVT -5/8 physical therapy reevaluate patient.  Patient lives at home not a safe discharge.  Evaluate for CIR vs SNF - 5/10 patient has agreed to SNF   Elevated CPK -Secondary to mechanical fall. - CPK elevation resolved, last check at 104 Lab Results  Component Value Date   CKTOTAL 19 (L) 09/20/2022   CKTOTAL 28 (L) 09/19/2022   CKTOTAL 40 09/18/2022   CKTOTAL 36 (L)  09/17/2022   CKTOTAL 104 09/15/2022  -Resolved   Cirrhosis secondary to NASH -Propranolol 60 mg daily -Diuretics being held given concern for intravascular fluid depletion, can consider restarting -5/9 continue to hold diuretics   CKD stage IIIb (baseline 1.4-1.8) Lab Results  Component Value Date   CREATININE 1.17 (H) 09/20/2022   CREATININE 1.17 (H) 09/19/2022   CREATININE 1.31 (H) 09/18/2022   CREATININE 1.31 (H) 09/17/2022   CREATININE 1.27 (H) 09/16/2022  -5/9 better than baseline.   Essential HTN -Cardizem 120 mg daily - Lisinopril 5 mg daily -Propranolol 60 mg daily  Elevated troponin - 5/9 troponin mildly elevated we will continue to trend.  Latest Reference Range & Units 09/17/22 09:23 09/17/22 11:18  Troponin I (High Sensitivity) <18 ng/L 61 (H) 60 (H)  (H): Data is abnormally high -5/9 EKG possible anterior infarct?.   DM type II controlled with hyperglycemia/Hypoglycemia  - 5/9 hemoglobin A 1C= 6.3 - 5/ lipid panel.  LDL= 34, within goal. - 5/12 decrease Semglee 10 units BID - 5/12 DC NovoLog  -  5/12 decrease to sensitive SSI  CBG (last 3)  Recent Labs    09/20/22 0415 09/20/22 0832 09/20/22 1007  GLUCAP 137* 46* 118*   -5/10 decrease steroids see above - 5/12 decrease Solu-Medrol 20 mg daily .  Hypophosphatemia - Phosphorus goal> 2.5 - 5/9 Phos-Nak 1 packet x 3 doses    Goals of care -5/8 physical therapy reevaluate patient.  Patient lives at home not a safe discharge.  Evaluate for CIR vs SNF -5/11 per LCSW has SNF bed on Monday      Mobility Assessment (last 72 hours)     Mobility Assessment     Row Name 09/18/22 1915 09/18/22 1047 09/17/22 1915 09/17/22 1606     Does patient have an order for bedrest or is patient medically unstable No - Continue assessment No - Continue assessment No - Continue assessment --    What is the highest level of mobility based on the progressive mobility assessment? Level 5 (Walks with assist in room/hall)  - Balance while stepping forward/back and can walk in room with assist - Complete Level 5 (Walks with assist in room/hall) - Balance while stepping forward/back and can walk in room with assist - Complete Level 5 (Walks with assist in room/hall) - Balance while stepping forward/back and can walk in room with assist - Complete Level 5 (Walks with assist in room/hall) - Balance while stepping forward/back and can walk in room with assist - Complete    Is the above level different from baseline mobility prior to current illness? Yes - Recommend PT order Yes - Recommend PT order Yes - Recommend PT order --                  Time: 50 minutes         Care during the described time interval was provided by me .  I have reviewed this patient's available data, including medical history, events of note, physical examination, and all test results as part of my evaluation.

## 2022-09-20 NOTE — Progress Notes (Signed)
Physical Therapy Treatment Patient Details Name: Rhonda Tyler MRN: [id number] DOB: [birth year] Today's Date: 09/20/2022   History of Present Illness Pt is a 82 y.o. female presenting to hospital 09/13/22 s/p fall at home; unable to get up off the floor; c/o pain in mid upper back, B knees, and head.  Recent fatigue, generalized weakness, aches, SOB, and cough.  Pt admitted with PNA, generalized weakness, and elevated CK.  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    PT Comments    Pt sitting EOB ready for gait.  Stated she is a bit sore from gait yesterday but ready to walk again today.  Stood with min guard and is able to walk to Geographical information systems officer and back with RW and min guard.  Slow and  cautious gait but no LOB or buckling noted.  She stated she does not feel like she is walking as well as normal.  Opts to remain up in chair after session with needs met.  General fatigued noted from activity.   Recommendations for follow up therapy are one component of a multi-disciplinary discharge planning process, led by the attending physician.  Recommendations may be updated based on patient status, additional functional criteria and insurance authorization.  Follow Up Recommendations       Assistance Recommended at Discharge Intermittent Supervision/Assistance  Patient can return home with the following A little help with walking and/or transfers;A little help with bathing/dressing/bathroom;Assistance with cooking/housework;Assist for transportation;Help with stairs or ramp for entrance   Equipment Recommendations  BSC/3in1    Recommendations for Other Services       Precautions / Restrictions Precautions Precautions: Fall Restrictions Weight Bearing Restrictions: No     Mobility  Bed Mobility               General bed mobility comments: sitting EOB upon arrival and opts to sit in recliner upon return Patient Response:  Cooperative  Transfers Overall transfer level: Needs assistance Equipment used: Rolling walker (2 wheels) Transfers: Sit to/from Stand Sit to Stand: Min guard                Ambulation/Gait Ambulation/Gait assistance: Land (Feet): 100 Feet Assistive device: Rolling walker (2 wheels) Gait Pattern/deviations: Step-through pattern, Decreased step length - right, Decreased step length - left, Trunk flexed Gait velocity: decreased     General Gait Details: limited d/t SOB and generalized weakness - sats high 90's at rest and after gait on room air   Stairs             Wheelchair Mobility    Modified Rankin (Stroke Patients Only)       Balance Overall balance assessment: Needs assistance Sitting-balance support: No upper extremity supported, Feet supported Sitting balance-Leahy Scale: Normal     Standing balance support: Bilateral upper extremity supported Standing balance-Leahy Scale: Good                              Cognition Arousal/Alertness: Awake/alert Behavior During Therapy: WFL for tasks assessed/performed Overall Cognitive Status: Within Functional Limits for tasks assessed                                          Exercises      General Comments  Pertinent Vitals/Pain Pain Assessment Pain Assessment: Faces Faces Pain Scale: Hurts a little bit Pain Location: low back and L knee Pain Descriptors / Indicators: Sore Pain Intervention(s): Limited activity within patient's tolerance, Monitored during session, Repositioned    Home Living                          Prior Function            PT Goals (current goals can now be found in the care plan section) Progress towards PT goals: Progressing toward goals    Frequency    Min 3X/week      PT Plan Current plan remains appropriate    Co-evaluation              AM-PAC PT "6 Clicks" Mobility   Outcome  Measure  Help needed turning from your back to your side while in a flat bed without using bedrails?: None Help needed moving from lying on your back to sitting on the side of a flat bed without using bedrails?: None Help needed moving to and from a bed to a chair (including a wheelchair)?: A Little Help needed standing up from a chair using your arms (e.g., wheelchair or bedside chair)?: A Little Help needed to walk in hospital room?: A Little Help needed climbing 3-5 steps with a railing? : A Lot 6 Click Score: 19    End of Session Equipment Utilized During Treatment: Gait belt Activity Tolerance: Patient tolerated treatment well;Patient limited by fatigue Patient left: in chair;with call bell/phone within reach;with chair alarm set Nurse Communication: Mobility status;Precautions;Other (comment) PT Visit Diagnosis: Muscle weakness (generalized) (M62.81);History of falling (Z91.81);Difficulty in walking, not elsewhere classified (R26.2)     Time: 1610-9604 PT Time Calculation (min) (ACUTE ONLY): 10 min  Charges:  $Gait Training: 8-22 mins                   Danielle Dess, PTA 09/20/22, 12:01 PM

## 2022-09-21 DIAGNOSIS — R531 Weakness: Secondary | ICD-10-CM | POA: Diagnosis not present

## 2022-09-21 DIAGNOSIS — M79605 Pain in left leg: Secondary | ICD-10-CM | POA: Diagnosis not present

## 2022-09-21 DIAGNOSIS — J189 Pneumonia, unspecified organism: Secondary | ICD-10-CM | POA: Diagnosis not present

## 2022-09-21 DIAGNOSIS — S8992XA Unspecified injury of left lower leg, initial encounter: Secondary | ICD-10-CM | POA: Diagnosis not present

## 2022-09-21 LAB — COMPREHENSIVE METABOLIC PANEL
ALT: 21 U/L (ref 0–44)
AST: 19 U/L (ref 15–41)
Albumin: 2.8 g/dL — ABNORMAL LOW (ref 3.5–5.0)
Alkaline Phosphatase: 71 U/L (ref 38–126)
Anion gap: 6 (ref 5–15)
BUN: 40 mg/dL — ABNORMAL HIGH (ref 8–23)
CO2: 26 mmol/L (ref 22–32)
Calcium: 9.4 mg/dL (ref 8.9–10.3)
Chloride: 102 mmol/L (ref 98–111)
Creatinine, Ser: 1.21 mg/dL — ABNORMAL HIGH (ref 0.44–1.00)
GFR, Estimated: 46 mL/min — ABNORMAL LOW (ref >60)
Glucose, Bld: 217 mg/dL — ABNORMAL HIGH (ref 70–99)
Potassium: 5 mmol/L (ref 3.5–5.1)
Sodium: 134 mmol/L — ABNORMAL LOW (ref 135–145)
Total Bilirubin: 0.7 mg/dL (ref 0.3–1.2)
Total Protein: 5.4 g/dL — ABNORMAL LOW (ref 6.5–8.1)

## 2022-09-21 LAB — CBC WITH DIFFERENTIAL/PLATELET
Abs Immature Granulocytes: 0.07 10*3/uL (ref 0.00–0.07)
Basophils Absolute: 0 10*3/uL (ref 0.0–0.1)
Basophils Relative: 0 %
Eosinophils Absolute: 0 10*3/uL (ref 0.0–0.5)
Eosinophils Relative: 0 %
HCT: 28.8 % — ABNORMAL LOW (ref 36.0–46.0)
Hemoglobin: 9 g/dL — ABNORMAL LOW (ref 12.0–15.0)
Immature Granulocytes: 1 %
Lymphocytes Relative: 12 %
Lymphs Abs: 0.6 10*3/uL — ABNORMAL LOW (ref 0.7–4.0)
MCH: 28.6 pg (ref 26.0–34.0)
MCHC: 31.3 g/dL (ref 30.0–36.0)
MCV: 91.4 fL (ref 80.0–100.0)
Monocytes Absolute: 0.4 10*3/uL (ref 0.1–1.0)
Monocytes Relative: 8 %
Neutro Abs: 4.2 10*3/uL (ref 1.7–7.7)
Neutrophils Relative %: 79 %
Platelets: 218 10*3/uL (ref 150–400)
RBC: 3.15 MIL/uL — ABNORMAL LOW (ref 3.87–5.11)
RDW: 14.4 % (ref 11.5–15.5)
WBC: 5.3 10*3/uL (ref 4.0–10.5)
nRBC: 0 % (ref 0.0–0.2)

## 2022-09-21 LAB — GLUCOSE, CAPILLARY
Glucose-Capillary: 237 mg/dL — ABNORMAL HIGH (ref 70–99)
Glucose-Capillary: 131 mg/dL — ABNORMAL HIGH (ref 70–99)
Glucose-Capillary: 94 mg/dL (ref 70–99)
Glucose-Capillary: 159 mg/dL — ABNORMAL HIGH (ref 70–99)
Glucose-Capillary: 82 mg/dL (ref 70–99)

## 2022-09-21 LAB — PHOSPHORUS: Phosphorus: 2.4 mg/dL — ABNORMAL LOW (ref 2.5–4.6)

## 2022-09-21 LAB — CK: Total CK: 17 U/L — ABNORMAL LOW (ref 38–234)

## 2022-09-21 LAB — MAGNESIUM: Magnesium: 1.9 mg/dL (ref 1.7–2.4)

## 2022-09-21 MED ORDER — SODIUM CHLORIDE 0.9 % IV SOLN: 250.0000 mg | Freq: Once | INTRAVENOUS | Status: DC

## 2022-09-21 MED ORDER — INSULIN ASPART 100 UNIT/ML IJ SOLN
4.0000 [IU] | Freq: Three times a day (TID) | INTRAMUSCULAR | Status: DC
  Administered 2022-09-21: 4 [IU] via SUBCUTANEOUS
  Filled 2022-09-21 (×2): qty 1

## 2022-09-21 MED ORDER — SODIUM CHLORIDE 0.9 % IV SOLN
300.0000 mg | Freq: Once | INTRAVENOUS | Status: AC
  Administered 2022-09-21: 300 mg via INTRAVENOUS
  Filled 2022-09-21: qty 300

## 2022-09-21 MED ORDER — FERROUS SULFATE 325 (65 FE) MG PO TABS
325.0000 mg | ORAL_TABLET | Freq: Every day | ORAL | Status: DC
  Administered 2022-09-22: 325 mg via ORAL
  Filled 2022-09-21: qty 1

## 2022-09-21 MED ORDER — VITAMIN C 500 MG PO TABS
500.0000 mg | ORAL_TABLET | Freq: Every day | ORAL | Status: DC
  Administered 2022-09-21 – 2022-09-22 (×2): 500 mg via ORAL
  Filled 2022-09-21 (×2): qty 1

## 2022-09-21 MED ORDER — INSULIN REGULAR HUMAN 100 UNIT/ML IJ SOLN
4.0000 [IU] | Freq: Three times a day (TID) | INTRAMUSCULAR | Status: DC
  Filled 2022-09-21: qty 0.04

## 2022-09-21 MED ORDER — INSULIN ASPART 100 UNIT/ML IJ SOLN: 0.0000 [IU] | Freq: Every day | INTRAMUSCULAR | Status: DC

## 2022-09-21 MED ORDER — INSULIN ASPART 100 UNIT/ML IJ SOLN
0.0000 [IU] | Freq: Three times a day (TID) | INTRAMUSCULAR | Status: DC
  Administered 2022-09-21: 2 [IU] via SUBCUTANEOUS
  Filled 2022-09-21 (×2): qty 1

## 2022-09-21 MED ORDER — SODIUM PHOSPHATES 45 MMOLE/15ML IV SOLN
15.0000 mmol | Freq: Once | INTRAVENOUS | Status: AC
  Administered 2022-09-21: 15 mmol via INTRAVENOUS
  Filled 2022-09-21: qty 5

## 2022-09-21 MED ORDER — INSULIN ASPART PROT & ASPART (70-30 MIX) 100 UNIT/ML ~~LOC~~ SUSP
25.0000 [IU] | Freq: Two times a day (BID) | SUBCUTANEOUS | Status: DC
  Administered 2022-09-21 – 2022-09-22 (×2): 25 [IU] via SUBCUTANEOUS
  Filled 2022-09-21: qty 10

## 2022-09-21 NOTE — Progress Notes (Signed)
Rhonda Tyler:[id number] DOB: 07/15/1940 DOA: 09/13/2022 PCP: Lauro Regulus, MD   Subj: 82 yo WF PMHx HTN, CAD, DM2, CKD stage IIIb, Cirrhosis secondary to NASH  Admitted for generalized fatigue, malaise, myalgias, decreased p.o. intake and DOE with subsequent mechanical fall.  Workup was essentially unrevealing however CXR showed possible haziness at lingula and patient was admitted for CAP.     Obj: 5/13 afebrile overnight, A/O x 4.  States ambulated in the hallway today.  Having some increased back pain secondary to ambulating.     Objective: VITAL SIGNS: Temp: 97.9 F (36.6 C) (05/13 0831) BP: 162/57 (05/13 0831) Pulse Rate: 65 (05/13 0831)   VENTILATOR SETTINGS: Room air 5/12 SpO2 96%   Procedures/Significant Events: 5/8 left lower extremity Doppler negative for DVT 5/9 PCXR -Mild left basilar and lingular opacity is noted concerning for atelectasis or possibly infiltrate. 5/9 CT chest  1. Patchy ground-glass opacities in the upper lobes, greater on the left, compatible with multifocal infection. Follow-up in 6-8 weeks is recommended to ensure resolution. 2. Small bilateral pleural effusions greater on the left. 3. Age-indeterminate nondisplaced fractures through the fused anterior right osteophytes at T8-T9 at T10-T11, new from 10/13/2021. Unchanged spinal stenosis at T11-12.   Consultants:     Cultures 5/6 SARS coronavirus negative 5/9 respiratory virus panel negative   Antimicrobials: Anti-infectives (From admission, onward)    Start     Ordered Stop   09/14/22 2100  cefTRIAXone (ROCEPHIN) 2 g in sodium chloride 0.9 % 100 mL IVPB        09/13/22 2122 09/19/22 0559   09/14/22 2100  azithromycin (ZITHROMAX) tablet 500 mg        09/13/22 2122 09/18/22 0959   09/13/22 2100  cefTRIAXone (ROCEPHIN) 2 g in sodium chloride 0.9 % 100 mL IVPB        09/13/22 2059 09/13/22 2215   09/13/22 2100  azithromycin (ZITHROMAX) 500 mg in sodium chloride 0.9 %  250 mL IVPB        09/13/22 2059 09/14/22 0117        Intake/Output Summary (Last 24 hours) at 09/21/2022 1634 Last data filed at 09/21/2022 1430 Gross per 24 hour  Intake 720 ml  Output --  Net 720 ml    Physical Exam:  General: A/O x 4, No acute respiratory distress Eyes: negative scleral hemorrhage, negative anisocoria, negative icterus ENT: Negative Runny nose, negative gingival bleeding, Neck:  Negative scars, masses, torticollis, lymphadenopathy, JVD Lungs: Clear to auscultation bilaterally without wheezes or crackles Cardiovascular: Regular rate and rhythm without murmur gallop or rub normal S1 and S2 Abdomen: negative abdominal pain, nondistended, positive soft, bowel sounds, no rebound, no ascites, no appreciable mass Extremities: No significant cyanosis, clubbing, or edema bilateral lower extremities Skin: Negative rashes, lesions, ulcers Psychiatric:  Negative depression, negative anxiety, negative fatigue, negative mania  Central nervous system:  Cranial nerves II through XII intact, tongue/uvula midline, all extremities muscle strength 5/5, sensation intact throughout, negative dysarthria, negative expressive aphasia, negative receptive aphasia.   DVT prophylaxis: Subcu heparin Code Status: Full Family Communication:  Status is: Inpatient    Dispo: The patient is from: Home              Anticipated d/c is to: SNF              Anticipated d/c date is: > 3 days              Patient currently is not medically stable to d/c.  Assessment & Plan: Covid vaccination;   Principal Problem:   Pneumonia Active Problems:   CAD (coronary artery disease), autologous vein bypass graft   Essential hypertension   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)   OSA on CPAP   Type 2 diabetes mellitus with diabetic chronic kidney disease (HCC)   Chronic kidney disease, stage 3b (HCC)   Elevated CK   Generalized weakness  Iron deficiency  anemia Lab Results  Component Value Date   HGB 9.0 (L) 09/21/2022   HGB 9.0 (L) 09/20/2022   HGB 9.6 (L) 09/19/2022   HGB 8.9 (L) 09/18/2022   HGB 8.5 (L) 09/17/2022  -5/8 stable appears to be trending up. - 5/8 occult blood pending - 5/9 occult blood pending -5/9 anemia panel consistent with iron deficiency. -5/9 given patient's unknown infection cause will hold on transfusing iron until completed at least 5 days of antibiotics. -Protonix IV 40 mg BID -5/13 iron dextran 250 mg x 1 - 5/13 vitamin C 500 mg daily - 5/13 iron sulfate 325 mg daily starting on 5/14   Acute on chronic Constipation -Protonix patient states that she has had "spells like this at home but not so bad". -Senna 8.6 mg PRN -5/8 MiraLAX BID    Malaise/myalgias/asthenia -Resolved most likely secondary to CAP, mechanical fall. -5/11 continued weakness, one-person assist  Refractory nausea - 5/11 Thorazine 25 mg x 1  CAP -Complete 7-day course of antibiotics  - 5/9 procalcitonin 0.11.  Algorithm would encourage stopping antibiotics but patient acutely SOB so we will continue. - 5/9 PCXR no significant change.  Still some atelectasis lingula. - 5/9 EKG pending - 5/9 titrate O2 to maintain SpO2> 93%.  Continuous pulse ox -5/9 DuoNeb QID -5/9 flutter valve - 5/9 incentive spirometry - 5/9 Solu-Medrol 60 mg daily - 5/9 Mucinex DM -5/9 CT chest C/W multifocal pneumonia see results above - 5/9 D-dimer pending - 5/10 decrease Solu-Medrol 40 mg daily - DC Solu-Medrol    Metabolic acidosis -Resolved.     Mechanical fall -Trauma to left calf - 5/8 left lower extremity Doppler negative for DVT -5/8 physical therapy reevaluate patient.  Patient lives at home not a safe discharge.  Evaluate for CIR vs SNF - 5/10 patient has agreed to SNF   Elevated CPK -Secondary to mechanical fall. - CPK elevation resolved, last check at 104 Lab Results  Component Value Date   CKTOTAL 17 (L) 09/21/2022   CKTOTAL 19  (L) 09/20/2022   CKTOTAL 28 (L) 09/19/2022   CKTOTAL 40 09/18/2022   CKTOTAL 36 (L) 09/17/2022  -Resolved   Cirrhosis secondary to NASH -Propranolol 60 mg daily -Diuretics being held given concern for intravascular fluid depletion, can consider restarting -5/9 continue to hold diuretics   CKD stage IIIb (baseline 1.4-1.8) Lab Results  Component Value Date   CREATININE 1.21 (H) 09/21/2022   CREATININE 1.17 (H) 09/20/2022   CREATININE 1.17 (H) 09/19/2022   CREATININE 1.31 (H) 09/18/2022   CREATININE 1.31 (H) 09/17/2022  -5/9 better than baseline.   Essential HTN -Cardizem 120 mg daily - Lisinopril 5 mg daily -Propranolol 60 mg daily  Elevated troponin - 5/9 troponin mildly elevated we will continue to trend.  Latest Reference Range & Units 09/17/22 09:23 09/17/22 11:18  Troponin I (High Sensitivity) <18 ng/L 61 (H) 60 (H)  (H): Data is abnormally high -5/9 EKG possible anterior infarct?.   DM type II controlled with hyperglycemia/Hypoglycemia  - 5/9 hemoglobin A 1C= 6.3 - 5/ lipid panel.  LDL= 34, within goal. - 5/12 decrease Semglee 10 units BID - 5/12 DC NovoLog  - 5/12 decrease to sensitive SSI  CBG (last 3)  Recent Labs    09/21/22 0438 09/21/22 0833 09/21/22 1208  GLUCAP 237* 131* 94   -5/10 decrease steroids see above - 5/12 decrease Solu-Medrol 20 mg daily . -5/13 NovoLog 70/30 25 units BID (home dose)  Hypophosphatemia - Phosphorus goal> 2.5 - 5/9 Phos-Nak 1 packet x 3 doses    Goals of care -5/8 physical therapy reevaluate patient.  Patient lives at home not a safe discharge.  Evaluate for CIR vs SNF -5/11 per LCSW has SNF bed on Monday      Mobility Assessment (last 72 hours)     Mobility Assessment     Row Name 09/21/22 0940 09/20/22 2015 09/20/22 1300 09/20/22 1158 09/18/22 1915   Does patient have an order for bedrest or is patient medically unstable Yes- Bedfast (Level 1) - Complete Yes- Bedfast (Level 1) - Complete No - Continue  assessment -- No - Continue assessment   What is the highest level of mobility based on the progressive mobility assessment? Level 5 (Walks with assist in room/hall) - Balance while stepping forward/back and can walk in room with assist - Complete Level 5 (Walks with assist in room/hall) - Balance while stepping forward/back and can walk in room with assist - Complete Level 5 (Walks with assist in room/hall) - Balance while stepping forward/back and can walk in room with assist - Complete Level 5 (Walks with assist in room/hall) - Balance while stepping forward/back and can walk in room with assist - Complete Level 5 (Walks with assist in room/hall) - Balance while stepping forward/back and can walk in room with assist - Complete   Is the above level different from baseline mobility prior to current illness? Yes - Recommend PT order Yes - Recommend PT order Yes - Recommend PT order -- Yes - Recommend PT order                 Time: 50 minutes         Care during the described time interval was provided by me .  I have reviewed this patient's available data, including medical history, events of note, physical examination, and all test results as part of my evaluation.

## 2022-09-21 NOTE — Progress Notes (Signed)
Mobility Specialist - Progress Note   09/21/22 1032  Mobility  Activity Ambulated with assistance in hallway  Level of Assistance Standby assist, set-up cues, supervision of patient - no hands on  Assistive Device Front wheel walker  Distance Ambulated (ft) 160 ft  Activity Response Tolerated well  $Mobility charge 1 Mobility  Mobility Specialist Start Time (ACUTE ONLY) 1006  Mobility Specialist Stop Time (ACUTE ONLY) 1018  Mobility Specialist Time Calculation (min) (ACUTE ONLY) 12 min   Pt amb in room upon entry, utilizing RA. Pt expressed headache, nausea and back pain (RN aware)-- agreeable to amb in the hallway this date. Pt amb one lap around the NS, expresses headache and nausea still present during amb however " not as bad". Pt returned to room, left seated in the chair with needs within reach.   Zetta Bills Mobility Specialist 09/21/22 10:38 AM

## 2022-09-21 NOTE — Discharge Summary (Signed)
Physician Discharge Summary  Rhonda Tyler ZOX:[id number] DOB: 02-Jul-1940 DOA: 09/13/2022  PCP: Lauro Regulus, MD  Admit date: 09/13/2022 Discharge date: 09/22/2022  Time spent: 35 minutes  Recommendations for Outpatient Follow-up:   Iron deficiency anemia  Lab Results  Component Value Date   HGB 10.0 (L) 09/22/2022   HGB 9.0 (L) 09/21/2022   HGB 9.0 (L) 09/20/2022   HGB 9.6 (L) 09/19/2022   HGB 8.9 (L) 09/18/2022  -5/8 stable appears to be trending up. - 5/8 occult blood pending - 5/9 occult blood pending -5/9 anemia panel consistent with iron deficiency. -5/9 given patient's unknown infection cause will hold on transfusing iron until completed at least 5 days of antibiotics. -Protonix IV 40 mg BID -5/13 iron dextran 250 mg x 1 - 5/13 vitamin C 500 mg daily - 5/13 iron sulfate 325 mg daily starting on 5/14    Acute on chronic Constipation -Protonix patient states that she has had "spells like this at home but not so bad". -Senna 8.6 mg PRN -5/8 MiraLAX BID   Refractory nausea - 5/11 Thorazine 25 mg x 1 - Resolved   CAP -Complete 7-day course of antibiotics  - 5/9 procalcitonin 0.11.  Algorithm would encourage stopping antibiotics but patient acutely SOB so we will continue. - 5/9 PCXR no significant change.  Still some atelectasis lingula. - 5/9 EKG pending - 5/9 titrate O2 to maintain SpO2> 93%.  Continuous pulse ox -5/9 DuoNeb QID -5/9 flutter valve - 5/9 incentive spirometry - 5/9 Solu-Medrol 60 mg daily - 5/9 Mucinex DM -5/9 CT chest C/W multifocal pneumonia see results above - 5/9 D-dimer pending - 5/10 decrease Solu-Medrol 40 mg daily -5/13 DC Solu-Medrol -Resolved     Metabolic acidosis -Resolved.     Mechanical fall -Trauma to left calf - 5/8 left lower extremity Doppler negative for DVT -5/8 physical therapy reevaluate patient.  Patient lives at home not a safe discharge.  Evaluate for CIR vs SNF - 5/10 patient has agreed to SNF      Elevated CPK -Secondary to mechanical fall. - CPK elevation resolved, last check at 104 Lab Results  Component Value Date   CKTOTAL 14 (L) 09/22/2022   CKTOTAL 17 (L) 09/21/2022   CKTOTAL 19 (L) 09/20/2022   CKTOTAL 28 (L) 09/19/2022   CKTOTAL 40 09/18/2022  -Resolved  Cirrhosis secondary to NASH -Propranolol 60 mg daily -Diuretics being held given concern for intravascular fluid depletion, can consider restarting -5/9 continue to hold diuretics -Patient currently euvolemic will allow PCP to decide when/if to restart diuretics.   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   Latest Reference Range & Units 09/17/22 09:23 09/17/22 11:18  Troponin I (High Sensitivity) <18 ng/L 61 (H) 60 (H)  (H): Data is abnormally high -5/9 EKG possible anterior infarct?.   DM type II controlled with hyperglycemia/Hypoglycemia  - 5/9 hemoglobin A 1C= 6.3 - 5/ lipid panel.  LDL= 34, within goal. - 5/12 decrease Semglee 10 units BID - 5/12 DC NovoLog  - 5/12 decrease to sensitive SSI  -5/13 DC Semglee - 5/13 NPH 70/30 25  units BID - 5/13 Novolin R 4 units qac CBG (last 3)  Recent Labs    09/21/22 2143 09/22/22 0752 09/22/22 1207  GLUCAP 82 66* 144*   -5/10 decrease steroids see above - 5/12 decrease Solu-Medrol 20 mg daily . -5/13 DC steroids -5/14 stable  Hypophosphatemia - Phosphorus goal> 2.5 - 5/9 Phos-Nak 1 packet x 3 doses   Malaise/myalgias/asthenia -Resolved most likely secondary to CAP, mechanical fall. 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 intake and DOE with subsequent mechanical fall.  Workup was essentially unrevealing however CXR showed possible haziness at lingula and patient was admitted for CAP.  Hospital Course:  See above  VENTILATOR SETTINGS: Room air 5/12 SpO2 96%     Procedures/Significant Events: 5/8 left lower extremity Doppler negative for DVT 5/9 PCXR -Mild left basilar and lingular opacity is noted concerning for atelectasis or possibly infiltrate. 5/9 CT chest  1. Patchy ground-glass opacities in the upper lobes, greater on the left, compatible with multifocal infection. Follow-up in 6-8 weeks is recommended to ensure resolution. 2. Small bilateral pleural effusions greater on the left. 3. Age-indeterminate nondisplaced fractures through the fused anterior right osteophytes at T8-T9 at T10-T11, new from 10/13/2021. Unchanged spinal stenosis at T11-12.    Consultants:        Cultures 5/6 SARS coronavirus negative 5/9 respiratory virus panel negative   Antibiotics Anti-infectives (From admission, onward)    Start     Ordered Stop   09/14/22 2100  cefTRIAXone (ROCEPHIN) 2 g in sodium chloride 0.9 % 100 mL IVPB        09/13/22 2122 09/18/22  0557   09/14/22 2100  azithromycin (ZITHROMAX) tablet 500 mg        09/13/22 2122 09/17/22 0840   09/13/22 2100  cefTRIAXone (ROCEPHIN) 2 g in sodium chloride 0.9 % 100 mL IVPB        09/13/22 2059 09/13/22 2215   09/13/22 2100  azithromycin (ZITHROMAX) 500 mg in sodium chloride 0.9 % 250 mL IVPB        09/13/22 2059 09/14/22 0117         Discharge Exam: Vitals:   09/22/22 0408 09/22/22 0700 09/22/22 0756 09/22/22 0756  BP: (!) 153/78  (!) 141/52 (!) 141/52  Pulse: 60  (!) 59 (!) 58  Resp: 18  16 16   Temp: (!) 97.4 F (36.3 C)  97.7 F (36.5 C) 97.7 F (36.5 C)  TempSrc: Oral  Oral Oral  SpO2: 97%  100% 100%  Weight:  82.3 kg    Height:       General: A/O x 4, No acute respiratory distress Eyes: negative scleral hemorrhage, negative anisocoria, negative icterus ENT:  Negative Runny nose, negative gingival bleeding, Neck:  Negative scars, masses, torticollis, lymphadenopathy, JVD Lungs: Clear to auscultation bilaterally without wheezes or crackles Cardiovascular: Regular rate and rhythm without murmur gallop or rub normal S1 and S2  Discharge Instructions   Allergies as of 09/22/2022       Reactions   Levaquin [levofloxacin In D5w] Shortness Of Breath   Penicillins Hives   Codeine Other (See Comments)   Hallucinate   Lipitor [atorvastatin Calcium] Other (See Comments)   Muscle pain   Lopressor [metoprolol Tartrate] Other (See Comments)   Heart races   Potassium-containing Compounds Other (See Comments)    Facial flushing   Procardia [nifedipine] Other (See Comments)   Heart races   Sucralfate Nausea Only   Tramadol Other (See Comments)   Confusion   Allegra [fexofenadine] Rash   Naltrexone Other (See Comments)   Severe headache, nausea, "body flashes".   Sulfa Antibiotics Rash        Medication List     STOP taking these medications    furosemide 40 MG tablet Commonly known as: LASIX       TAKE these medications    acetaminophen 500 MG  tablet Commonly known as: TYLENOL Take 500-1,000 mg by mouth every 4 (four) hours as needed for mild pain or fever.   alum & mag hydroxide-simeth 200-200-20 MG/5ML suspension Commonly known as: MAALOX/MYLANTA Take 30 mLs by mouth every 4 (four) hours as needed for indigestion or heartburn.   ascorbic acid 500 MG tablet Commonly known as: VITAMIN C Take 1 tablet (500 mg total) by mouth daily.   B-12 2500 MCG Tabs Take 2,500 mcg by mouth daily.   cholecalciferol 25 MCG (1000 UNIT) tablet Commonly known as: VITAMIN D3 Take 1,000 Units by mouth daily.   diltiazem 120 MG 24 hr capsule Commonly known as: CARDIZEM CD Take 1 capsule (120 mg total) by mouth daily.   fenofibrate 160 MG tablet Take 160 mg by mouth daily.   gabapentin 100 MG capsule Commonly known as: NEURONTIN Take 1 capsule by mouth 3 (three) times daily.   HYDROcodone-acetaminophen 10-325 MG tablet Commonly known as: NORCO Take 1 tablet by mouth every 4 (four) hours as needed for moderate pain or severe pain.   hydrOXYzine 25 MG tablet Commonly known as: ATARAX Take 25 mg by mouth daily.   insulin regular 100 units/mL injection Commonly known as: NOVOLIN R Inject 1-10 Units into the skin daily. lunchtime   Iron 325 (65 Fe) MG Tabs Take 1 tablet by mouth daily.   lisinopril 5 MG tablet Commonly known as: ZESTRIL Take 5 mg by mouth daily.   loratadine 10 MG tablet Commonly known as: CLARITIN Take 10 mg by mouth daily.   magnesium oxide 400 (240 Mg) MG tablet Commonly known as: MAG-OX Take 400 mg by mouth 2 (two) times daily.   multivitamin with minerals Tabs tablet Take 1 tablet by mouth at bedtime.   nitroGLYCERIN 0.4 MG SL tablet Commonly known as: NITROSTAT Place 0.4 mg under the tongue every 5 (five) minutes as needed for chest pain.   NovoLIN 70/30 (70-30) 100 UNIT/ML injection Generic drug: insulin NPH-regular Human Inject 25 Units into the skin daily with breakfast AND 25 Units daily with  supper. What changed: See the new instructions.   ondansetron 4 MG tablet Commonly known as: ZOFRAN Take 4 mg by mouth every 8 (eight) hours as needed for vomiting or nausea.   pantoprazole 40 MG tablet Commonly known as: PROTONIX Take 40  mg by mouth 2 (two) times daily.   polyethylene glycol powder 17 GM/SCOOP powder Commonly known as: GLYCOLAX/MIRALAX Take 17 g by mouth 2 (two) times daily.   propranolol ER 60 MG 24 hr capsule Commonly known as: Inderal LA Take 1 capsule (60 mg total) by mouth daily.   senna 8.6 MG tablet Commonly known as: SENOKOT Take 1 tablet by mouth daily.   sertraline 100 MG tablet Commonly known as: ZOLOFT Take 100 mg by mouth daily.   spironolactone 50 MG tablet Commonly known as: ALDACTONE Take 50 mg by mouth daily.       Allergies  Allergen Reactions   Levaquin [Levofloxacin In D5w] Shortness Of Breath   Penicillins Hives   Codeine Other (See Comments)    Hallucinate   Lipitor [Atorvastatin Calcium] Other (See Comments)    Muscle pain   Lopressor [Metoprolol Tartrate] Other (See Comments)    Heart races   Potassium-Containing Compounds Other (See Comments)     Facial flushing   Procardia [Nifedipine] Other (See Comments)    Heart races   Sucralfate Nausea Only   Tramadol Other (See Comments)    Confusion   Allegra [Fexofenadine] Rash   Naltrexone Other (See Comments)    Severe headache, nausea, "body flashes".    Sulfa Antibiotics Rash    Contact information for after-discharge care     Destination     [facility] .   Service: Skilled Nursing Contact information: [address] [place] [address] [phone]                      The results of significant diagnostics from this hospitalization (including imaging, microbiology, ancillary and laboratory) are listed below for reference.    Significant Diagnostic Studies: ECHOCARDIOGRAM COMPLETE  Result Date:  09/18/2022    ECHOCARDIOGRAM REPORT   Patient Name:   AYRA FALCONER Date of Exam: 09/18/2022 Medical Rec #:  [id number]     Height:       63.0 in Accession #:    [id number]    Weight:       163.8 lb Date of Birth:  December 08, 1940     BSA:          1.776 m Patient Age:    75 years      BP:           127/51 mmHg Patient Gender: F             HR:           67 bpm. Exam Location:  [facility] Procedure: 2D Echo, Cardiac Doppler and Color Doppler Indications:     CHF  History:         Patient has prior history of Echocardiogram examinations, most                  recent 03/24/2018. CHF, CAD and Previous Myocardial Infarction,                  Arrythmias:Tachycardia, Signs/Symptoms:Chest Pain; Risk                  Factors:Hypertension, Diabetes and Dyslipidemia. CKD.  Sonographer:     Mikki Harbor Referring Phys:  [id number] Beya Tipps J Tiney Zipper Diagnosing Phys: Marcina Millard MD  Sonographer Comments: Image acquisition challenging due to respiratory motion. IMPRESSIONS  1. Left ventricular ejection fraction, by estimation, is 65 to 70%. The left ventricle has normal function. The left ventricle has  no regional wall motion abnormalities. There is mild left ventricular hypertrophy. Left ventricular diastolic parameters were normal.  2. Right ventricular systolic function is normal. The right ventricular size is normal. There is severely elevated pulmonary artery systolic pressure.  3. Right atrial size was mild to moderately dilated.  4. The mitral valve is normal in structure. Moderate to severe mitral valve regurgitation. No evidence of mitral stenosis.  5. Tricuspid valve regurgitation is severe.  6. The aortic valve is normal in structure. Aortic valve regurgitation is mild. No aortic stenosis is present.  7. The inferior vena cava is normal in size with greater than 50% respiratory variability, suggesting right atrial pressure of 3 mmHg. FINDINGS  Left Ventricle: Left ventricular ejection fraction, by estimation, is 65 to 70%.  The left ventricle has normal function. The left ventricle has no regional wall motion abnormalities. The left ventricular internal cavity size was normal in size. There is  mild left ventricular hypertrophy. Left ventricular diastolic parameters were normal. Right Ventricle: The right ventricular size is normal. No increase in right ventricular wall thickness. Right ventricular systolic function is normal. There is severely elevated pulmonary artery systolic pressure. The tricuspid regurgitant velocity is 3.87 m/s, and with an assumed right atrial pressure of 15 mmHg, the estimated right ventricular systolic pressure is 74.9 mmHg. Left Atrium: Left atrial size was normal in size. Right Atrium: Right atrial size was mild to moderately dilated. Pericardium: There is no evidence of pericardial effusion. Mitral Valve: The mitral valve is normal in structure. Moderate to severe mitral valve regurgitation. No evidence of mitral valve stenosis. MV peak gradient, 23.8 mmHg. The mean mitral valve gradient is 8.0 mmHg. Tricuspid Valve: The tricuspid valve is normal in structure. Tricuspid valve regurgitation is severe. No evidence of tricuspid stenosis. Aortic Valve: The aortic valve is normal in structure. Aortic valve regurgitation is mild. No aortic stenosis is present. Aortic valve mean gradient measures 4.0 mmHg. Aortic valve peak gradient measures 9.2 mmHg. Aortic valve area, by VTI measures 2.25 cm. Pulmonic Valve: The pulmonic valve was normal in structure. Pulmonic valve regurgitation is not visualized. No evidence of pulmonic stenosis. Aorta: The aortic root is normal in size and structure. Venous: The inferior vena cava is normal in size with greater than 50% respiratory variability, suggesting right atrial pressure of 3 mmHg. IAS/Shunts: No atrial level shunt detected by color flow Doppler.  LEFT VENTRICLE PLAX 2D LVIDd:         4.00 cm   Diastology LVIDs:         2.10 cm   LV e' medial:    9.46 cm/s LV PW:          1.40 cm   LV E/e' medial:  20.3 LV IVS:        1.30 cm   LV e' lateral:   11.10 cm/s LVOT diam:     1.90 cm   LV E/e' lateral: 17.3 LV SV:         80 LV SV Index:   45 LVOT Area:     2.84 cm  RIGHT VENTRICLE RV Basal diam:  3.55 cm RV Mid diam:    4.50 cm RV S prime:     11.50 cm/s TAPSE (M-mode): 2.1 cm LEFT ATRIUM             Index        RIGHT ATRIUM           Index LA diam:  5.20 cm 2.93 cm/m   RA Area:     18.10 cm LA Vol (A2C):   84.3 ml 47.46 ml/m  RA Volume:   45.40 ml  25.56 ml/m LA Vol (A4C):   97.3 ml 54.78 ml/m LA Biplane Vol: 90.6 ml 51.00 ml/m  AORTIC VALVE                    PULMONIC VALVE AV Area (Vmax):    2.11 cm     PV Vmax:       1.27 m/s AV Area (Vmean):   2.09 cm     PV Peak grad:  6.5 mmHg AV Area (VTI):     2.25 cm AV Vmax:           152.00 cm/s AV Vmean:          93.200 cm/s AV VTI:            0.356 m AV Peak Grad:      9.2 mmHg AV Mean Grad:      4.0 mmHg LVOT Vmax:         113.00 cm/s LVOT Vmean:        68.800 cm/s LVOT VTI:          0.282 m LVOT/AV VTI ratio: 0.79  AORTA Ao Root diam: 3.20 cm Ao Asc diam:  3.40 cm MITRAL VALVE                  TRICUSPID VALVE MV Area (PHT): 2.26 cm       TR Peak grad:   59.9 mmHg MV Area VTI:   1.08 cm       TR Vmax:        387.00 cm/s MV Peak grad:  23.8 mmHg MV Mean grad:  8.0 mmHg       SHUNTS MV Vmax:       2.44 m/s       Systemic VTI:  0.28 m MV Vmean:      122.0 cm/s     Systemic Diam: 1.90 cm MV Decel Time: 335 msec MR Peak grad:    97.0 mmHg MR Mean grad:    64.0 mmHg MR Vmax:         492.50 cm/s MR Vmean:        375.0 cm/s MR PISA:         3.08 cm MR PISA Eff ROA: 53 mm MR PISA Radius:  0.70 cm MV E velocity: 192.00 cm/s MV A velocity: 110.00 cm/s MV E/A ratio:  1.75 Marcina Millard MD Electronically signed by Marcina Millard MD Signature Date/Time: 09/18/2022/1:01:33 PM    Final    CT CHEST WO CONTRAST  Result Date: 09/17/2022 CLINICAL DATA:  Respiratory illness, immunodeficiency, dyspnea on exertion. EXAM: CT  CHEST WITHOUT CONTRAST TECHNIQUE: Multidetector CT imaging of the chest was performed following the standard protocol without IV contrast. RADIATION DOSE REDUCTION: This exam was performed according to the departmental dose-optimization program which includes automated exposure control, adjustment of the mA and/or kV according to patient size and/or use of iterative reconstruction technique. COMPARISON:  Chest radiograph 09/17/2022 and CTA chest 10/13/2021 FINDINGS: Cardiovascular: Sternotomy and CABG. Mild cardiomegaly. Coronary artery and aortic atherosclerotic calcification. No pericardial effusion. Mediastinum/Nodes: Unremarkable esophagus. Subcentimeter mediastinal and hilar nodes are favored reactive. Lungs/Pleura: The central airways are patent. Bibasilar atelectasis/scarring. Small bilateral pleural effusions greater on the left. Patchy ground-glass opacities in the upper lobes greater on the left. Upper Abdomen: Cholecystectomy.  No  acute abnormality. Musculoskeletal: Thoracic spondylosis with bridging anterior osteophytes. There are age-indeterminate nondisplaced fractures through the fused anterior right osteophytes at T8-T9 at T10-T11, new from 10/13/2021. Partially visualized cervical spine fusion hardware. Unchanged marked spinal stenosis at T11-T12. IMPRESSION: 1. Patchy ground-glass opacities in the upper lobes, greater on the left, compatible with multifocal infection. Follow-up in 6-8 weeks is recommended to ensure resolution. 2. Small bilateral pleural effusions greater on the left. 3. Age-indeterminate nondisplaced fractures through the fused anterior right osteophytes at T8-T9 at T10-T11, new from 10/13/2021. Unchanged spinal stenosis at T11-12. Aortic Atherosclerosis (ICD10-I70.0). Electronically Signed   By: Minerva Fester M.D.   On: 09/17/2022 20:26   DG Chest Port 1 View  Result Date: 09/17/2022 CLINICAL DATA:  Shortness of breath. EXAM: PORTABLE CHEST 1 VIEW COMPARISON:  Sep 13, 2022.  FINDINGS: Stable cardiomediastinal silhouette. Sternotomy wires are noted. Right lung is unremarkable. Mild left basilar and lingular opacity is noted concerning for atelectasis or possibly infiltrate. Postsurgical changes are noted in cervical spine. IMPRESSION: Mild left basilar and lingular opacity is noted concerning for atelectasis or possibly infiltrate. Electronically Signed   By: Lupita Raider M.D.   On: 09/17/2022 09:21   US Venous Img Lower Unilateral Left (DVT)  Result Date: 09/16/2022 CLINICAL DATA:  Left leg pain and swelling, initial encounter EXAM: LEFT LOWER EXTREMITY VENOUS DOPPLER ULTRASOUND TECHNIQUE: Gray-scale sonography with graded compression, as well as color Doppler and duplex ultrasound were performed to evaluate the lower extremity deep venous systems from the level of the common femoral vein and including the common femoral, femoral, profunda femoral, popliteal and calf veins including the posterior tibial, peroneal and gastrocnemius veins when visible. The superficial great saphenous vein was also interrogated. Spectral Doppler was utilized to evaluate flow at rest and with distal augmentation maneuvers in the common femoral, femoral and popliteal veins. COMPARISON:  None Available. FINDINGS: Contralateral Common Femoral Vein: Respiratory phasicity is normal and symmetric with the symptomatic side. No evidence of thrombus. Normal compressibility. Common Femoral Vein: No evidence of thrombus. Normal compressibility, respiratory phasicity and response to augmentation. Saphenofemoral Junction: No evidence of thrombus. Normal compressibility and flow on color Doppler imaging. Profunda Femoral Vein: No evidence of thrombus. Normal compressibility and flow on color Doppler imaging. Femoral Vein: No evidence of thrombus. Normal compressibility, respiratory phasicity and response to augmentation. Popliteal Vein: No evidence of thrombus. Normal compressibility, respiratory phasicity and  response to augmentation. Calf Veins: No evidence of thrombus. Normal compressibility and flow on color Doppler imaging. Superficial Great Saphenous Vein: No evidence of thrombus. Normal compressibility. Venous Reflux:  None. Other Findings:  None. IMPRESSION: No evidence of deep venous thrombosis. Electronically Signed   By: Alcide Clever M.D.   On: 09/16/2022 19:46   DG Chest Portable 1 View  Result Date: 09/13/2022 CLINICAL DATA:  Weakness. EXAM: PORTABLE CHEST 1 VIEW COMPARISON:  10/13/2021. FINDINGS: Unchanged hyperexpanded lungs with prominent interstitial opacities, compatible with emphysema. Hazy opacities in the lingula obscure the left heart border, possibly reflecting aspiration or infection. No pleural effusion or pneumothorax. IMPRESSION: Hazy opacities in the lingula, possibly reflecting aspiration or infection. Electronically Signed   By: Orvan Falconer M.D.   On: 09/13/2022 18:15   DG Pelvis 1-2 Views  Result Date: 09/13/2022 CLINICAL DATA:  Larey Seat, low back pain EXAM: PELVIS - 1-2 VIEW COMPARISON:  None Available. FINDINGS: Single frontal view of the pelvis includes both hips. The bones are diffusely osteopenic. No acute displaced fracture, subluxation, or dislocation. Symmetrical bilateral hip osteoarthritis.  IMPRESSION: 1. Osteopenia and bilateral hip osteoarthritis. 2. No acute displaced fracture. Electronically Signed   By: Sharlet Salina M.D.   On: 09/13/2022 17:14   DG Knee Complete 4 Views Right  Result Date: 09/13/2022 CLINICAL DATA:  Right knee pain after fall EXAM: RIGHT KNEE - COMPLETE 4+ VIEW COMPARISON:  None Available. FINDINGS: Frontal, bilateral oblique, and cross-table lateral views of the right knee are obtained. No acute fracture, subluxation, or dislocation. There is 3 compartmental joint space narrowing. Diffuse chondrocalcinosis is identified, as well as calcifications along the joint capsule. Heterotopic ossification versus large enthesophyte is seen on the lower pole  of the patella and along the course of the patellar tendon. No joint effusion. Soft tissues are otherwise unremarkable. IMPRESSION: 1. No acute displaced fracture. 2. 3 compartmental joint space narrowing, with chondrocalcinosis and diffuse capsular calcifications. 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 3. Large enthesophyte versus heterotopic ossification within the patellar tendon. Electronically Signed   By: Sharlet Salina M.D.   On: 09/13/2022 17:13   DG Lumbar Spine 2-3 Views  Result Date: 09/13/2022 CLINICAL DATA:  Low back pain after fall EXAM: LUMBAR SPINE - 2-3 VIEW COMPARISON:  11/17/2003 FINDINGS: Frontal and cross-table lateral views of the lumbar spine are obtained on 3 images. There are 5 non-rib-bearing lumbar type vertebral bodies identified, with mild right convex scoliosis centered at the L3 level. There is a chronic compression deformity of the L3 vertebral body, with less than 50% loss of height. No evidence of acute fracture. There is diffuse lumbar spondylosis and facet hypertrophy, greatest from L3-4 through L5-S1. Sacroiliac joints are unremarkable. Symmetrical bilateral hip osteoarthritis. IMPRESSION: 1. Chronic L3 compression deformity with less than 50% loss of height. 2. No acute displaced fracture. 3. 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 Electronically Signed   By: Sharlet Salina M.D.   On: 09/13/2022 17:11   CT Head Wo Contrast  Result Date: 09/13/2022 CLINICAL DATA:  Head trauma, minor (Age >= 65y); Neck trauma (Age >= 65y). EXAM: CT HEAD WITHOUT CONTRAST CT CERVICAL SPINE WITHOUT CONTRAST TECHNIQUE: Multidetector CT imaging of the head and cervical spine was performed following the standard protocol without intravenous contrast. Multiplanar CT image reconstructions of the cervical spine were also generated. RADIATION DOSE REDUCTION: This exam was performed according to the departmental dose-optimization program which includes automated  exposure control, adjustment of the mA and/or kV according to patient size and/or use of iterative reconstruction technique. COMPARISON:  CT head and cervical spine 07/07/2021. MRI brain 12/18/2021. FINDINGS: CT HEAD FINDINGS Brain: No acute intracranial hemorrhage. Gray-white differentiation is preserved. No hydrocephalus or extra-axial collection. Unchanged 11 mm meningioma in the right posterior fossa (axial image 7 series 2). No significant mass effect or midline shift. Vascular: No hyperdense vessel or unexpected calcification. Skull: No calvarial fracture or suspicious bone lesion. Skull base is unremarkable. Sinuses/Orbits: Unremarkable. Other: None. CT CERVICAL SPINE FINDINGS Alignment: Normal. Skull base and vertebrae: No acute fracture. Intact craniocervical junction. Postoperative changes of prior C3-C7 corpectomy and ACDF. Intact hardware. No associated lucency. Solid bony fusion across the intervening levels. Soft tissues and spinal canal: No prevertebral fluid or swelling. No visible canal hematoma. Disc levels:  No high-grade spinal canal stenosis. Upper chest: Patchy ground-glass opacities in the lung apices, likely infectious or inflammatory. Other: None. IMPRESSION: 1. No acute intracranial abnormality. 2. No acute fracture or traumatic listhesis of the cervical spine. 3. Patchy ground-glass opacities in the lung apices, likely infectious or inflammatory. Electronically Signed  By: Orvan Falconer M.D.   On: 09/13/2022 16:45   CT Cervical Spine Wo Contrast  Result Date: 09/13/2022 CLINICAL DATA:  Head trauma, minor (Age >= 65y); Neck trauma (Age >= 65y). EXAM: CT HEAD WITHOUT CONTRAST CT CERVICAL SPINE WITHOUT CONTRAST TECHNIQUE: Multidetector CT imaging of the head and cervical spine was performed following the standard protocol without intravenous contrast. Multiplanar CT image reconstructions of the cervical spine were also generated. RADIATION DOSE REDUCTION: This exam was performed  according to the departmental dose-optimization program which includes automated exposure control, adjustment of the mA and/or kV according to patient size and/or use of iterative reconstruction technique. COMPARISON:  CT head and cervical spine 07/07/2021. MRI brain 12/18/2021. FINDINGS: CT HEAD FINDINGS Brain: No acute intracranial hemorrhage. Gray-white differentiation is preserved. No hydrocephalus or extra-axial collection. Unchanged 11 mm meningioma in the right posterior fossa (axial image 7 series 2). No significant mass effect or midline shift. Vascular: No hyperdense vessel or unexpected calcification. Skull: No calvarial fracture or suspicious bone lesion. Skull base is unremarkable. Sinuses/Orbits: Unremarkable. Other: None. CT CERVICAL SPINE FINDINGS Alignment: Normal. Skull base and vertebrae: No acute fracture. Intact craniocervical junction. Postoperative changes of prior C3-C7 corpectomy and ACDF. Intact hardware. No associated lucency. Solid bony fusion across the intervening levels. Soft tissues and spinal canal: No prevertebral fluid or swelling. No visible canal hematoma. Disc levels:  No high-grade spinal canal stenosis. Upper chest: Patchy ground-glass opacities in the lung apices, likely infectious or inflammatory. Other: None. IMPRESSION: 1. No acute intracranial abnormality. 2. No acute fracture or traumatic listhesis of the cervical spine. 3. Patchy ground-glass opacities in the lung apices, likely infectious or inflammatory. Electronically Signed   By: Orvan Falconer M.D.   On: 09/13/2022 16:45    Microbiology: Recent Results (from the past 240 hour(s))  Urine Culture     Status: Abnormal   Collection Time: 09/13/22  4:10 PM   Specimen: Urine, Random  Result Value Ref Range Status   Specimen Description   Final    URINE, RANDOM Performed at [facility], [address] [place], [place] [address]    Special Requests   Final    NONE Reflexed from [id number] Performed  at [facility], [address] [place], [place] [address]    Culture >=100,000 COLONIES/mL ESCHERICHIA COLI (A)  Final   Report Status 09/15/2022 FINAL  Final   Organism ID, Bacteria ESCHERICHIA COLI (A)  Final      Susceptibility   Escherichia coli - MIC*    AMPICILLIN >=32 RESISTANT Resistant     CEFAZOLIN <=4 SENSITIVE Sensitive     CEFEPIME <=0.12 SENSITIVE Sensitive     CEFTRIAXONE <=0.25 SENSITIVE Sensitive     CIPROFLOXACIN <=0.25 SENSITIVE Sensitive     GENTAMICIN <=1 SENSITIVE Sensitive     IMIPENEM <=0.25 SENSITIVE Sensitive     NITROFURANTOIN <=16 SENSITIVE Sensitive     TRIMETH/SULFA >=320 RESISTANT Resistant     AMPICILLIN/SULBACTAM 4 SENSITIVE Sensitive     PIP/TAZO <=4 SENSITIVE Sensitive     * >=100,000 COLONIES/mL ESCHERICHIA COLI  Blood Culture (routine x 2)     Status: None   Collection Time: 09/13/22  5:54 PM   Specimen: BLOOD LEFT HAND  Result Value Ref Range Status   Specimen Description BLOOD LEFT HAND  Final   Special Requests   Final    BOTTLES DRAWN AEROBIC AND ANAEROBIC Blood Culture results may not be optimal due to an inadequate volume of blood received in culture  bottles   Culture   Final    NO GROWTH 5 DAYS Performed at [facility], [address] [place], [place] [address]    Report Status 09/18/2022 FINAL  Final  Blood Culture (routine x 2)     Status: None   Collection Time: 09/13/22  5:54 PM   Specimen: BLOOD  Result Value Ref Range Status   Specimen Description BLOOD LEFT ANTECUBITAL  Final   Special Requests   Final    BOTTLES DRAWN AEROBIC AND ANAEROBIC Blood Culture adequate volume   Culture   Final    NO GROWTH 5 DAYS Performed at [facility], [address], [place], [place] [address]    Report Status 09/18/2022 FINAL  Final  SARS Coronavirus 2 by RT PCR (hospital order, performed in [facility] hospital lab) *cepheid single result test* Anterior Nasal Swab     Status: None   Collection Time:  09/14/22  2:24 PM   Specimen: Anterior Nasal Swab  Result Value Ref Range Status   SARS Coronavirus 2 by RT PCR NEGATIVE NEGATIVE Final    Comment: (NOTE) SARS-CoV-2 target nucleic acids are NOT DETECTED.  The SARS-CoV-2 RNA is generally detectable in upper and lower respiratory specimens during the acute phase of infection. The lowest concentration of SARS-CoV-2 viral copies this assay can detect is 250 copies / mL. A negative result does not preclude SARS-CoV-2 infection and should not be used as the sole basis for treatment or other patient management decisions.  A negative result may occur with improper specimen collection / handling, submission of specimen other than nasopharyngeal swab, presence of viral mutation(s) within the areas targeted by this assay, and inadequate number of viral copies (<250 copies / mL). A negative result must be combined with clinical observations, patient history, and epidemiological information.  Fact Sheet for Patients:   RoadLapTop.co.za  Fact Sheet for Healthcare Providers: http://kim-miller.com/  This test is not yet approved or  cleared by the [place] FDA and has been authorized for detection and/or diagnosis of SARS-CoV-2 by FDA under an Emergency Use Authorization (EUA).  This EUA will remain in effect (meaning this test can be used) for the duration of the COVID-19 declaration under Section 564(b)(1) of the Act, 21 U.S.C. section 360bbb-3(b)(1), unless the authorization is terminated or revoked sooner.  Performed at [facility], [address] [place], [place] [address]   Respiratory (~20 pathogens) panel by PCR     Status: None   Collection Time: 09/17/22 11:39 PM   Specimen: Nasopharyngeal Swab; Respiratory  Result Value Ref Range Status   Adenovirus NOT DETECTED NOT DETECTED Final   Coronavirus 229E NOT DETECTED NOT DETECTED Final    Comment: (NOTE) The Coronavirus on  the Respiratory Panel, DOES NOT test for the novel  Coronavirus (2019 nCoV)    Coronavirus HKU1 NOT DETECTED NOT DETECTED Final   Coronavirus NL63 NOT DETECTED NOT DETECTED Final   Coronavirus OC43 NOT DETECTED NOT DETECTED Final   Metapneumovirus NOT DETECTED NOT DETECTED Final   Rhinovirus / Enterovirus NOT DETECTED NOT DETECTED Final   Influenza A NOT DETECTED NOT DETECTED Final   Influenza B NOT DETECTED NOT DETECTED Final   Parainfluenza Virus 1 NOT DETECTED NOT DETECTED Final   Parainfluenza Virus 2 NOT DETECTED NOT DETECTED Final   Parainfluenza Virus 3 NOT DETECTED NOT DETECTED Final   Parainfluenza Virus 4 NOT DETECTED NOT DETECTED Final   Respiratory Syncytial Virus NOT DETECTED NOT DETECTED Final   Bordetella  pertussis NOT DETECTED NOT DETECTED Final   Bordetella Parapertussis NOT DETECTED NOT DETECTED Final   Chlamydophila pneumoniae NOT DETECTED NOT DETECTED Final   Mycoplasma pneumoniae NOT DETECTED NOT DETECTED Final    Comment: Performed at [facility], [address], [place], [place] [address]     Labs: Basic Metabolic Panel: Recent Labs  Lab 09/17/22 0537 09/18/22 0056 09/19/22 0530 09/20/22 0517 09/21/22 0541 09/22/22 0649  NA 135 132* 136 135 134*  --   K 4.4 5.1 5.0 4.9 5.0  --   CL 105 100 101 104 102  --   CO2 23 26 25 26 26   --   GLUCOSE 151* 345* 113* 111* 217*  --   BUN 35* 36* 38* 40* 40*  --   CREATININE 1.31* 1.31* 1.17* 1.17* 1.21*  --   CALCIUM 8.9 9.0 9.7 9.5 9.4  --   MG 2.0 2.1 2.2 2.0 1.9 1.8  PHOS 1.7* 2.3* 3.1 2.6 2.4* 3.6   Liver Function Tests: Recent Labs  Lab 09/17/22 0537 09/18/22 0056 09/19/22 0530 09/20/22 0517 09/21/22 0541  AST 20 23 22 17 19   ALT 17 19 21 20 21   ALKPHOS 49 59 64 67 71  BILITOT 0.5 0.7 0.7 0.8 0.7  PROT 5.2* 5.8* 6.3* 5.6* 5.4*  ALBUMIN 2.5* 2.8* 3.2* 2.9* 2.8*   No results for input(s): "LIPASE", "AMYLASE" in the last 168 hours. No results for input(s): "AMMONIA" in the last 168  hours.  CBC: Recent Labs  Lab 09/18/22 0056 09/19/22 0530 09/20/22 0517 09/21/22 0541 09/22/22 0649  WBC 3.9* 11.5* 4.7 5.3 6.3  NEUTROABS 3.3 9.7* 3.9 4.2 4.3  HGB 8.9* 9.6* 9.0* 9.0* 10.0*  HCT 28.3* 30.6* 28.5* 28.8* 31.7*  MCV 91.9 91.9 90.8 91.4 90.3  PLT 159 251 202 218 234   Cardiac Enzymes: Recent Labs  Lab 09/18/22 0056 09/19/22 0530 09/20/22 0517 09/21/22 0541 09/22/22 0649  CKTOTAL 40 28* 19* 17* 14*   BNP: BNP (last 3 results) Recent Labs    10/13/21 1056  BNP 74.5    ProBNP (last 3 results) No results for input(s): "PROBNP" in the last 8760 hours.  CBG: Recent Labs  Lab 09/21/22 1208 09/21/22 1717 09/21/22 2143 09/22/22 0752 09/22/22 1207  GLUCAP 94 159* 82 66* 144*       Signed:  Carolyne Littles, MD [facility]

## 2022-09-21 NOTE — Inpatient Diabetes Management (Signed)
Inpatient Diabetes Program Recommendations  AACE/ADA: New Consensus Statement on Inpatient Glycemic Control (2015)  Target Ranges:  Prepandial:   less than 140 mg/dL      Peak postprandial:   less than 180 mg/dL (1-2 hours)      Critically ill patients:  140 - 180 mg/dL   Lab Results  Component Value Date   GLUCAP 94 09/21/2022   HGBA1C 6.3 (H) 09/17/2022    Review of Glycemic Control  Latest Reference Range & Units 09/20/22 10:07 09/20/22 12:32 09/20/22 17:52 09/20/22 20:19 09/20/22 23:45 09/21/22 04:38 09/21/22 08:33 09/21/22 12:08  Glucose-Capillary 70 - 99 mg/dL 161 (H) 096 (H) 045 (H) 382 (H) 323 (H) 237 (H) 131 (H) 94   Diabetes history: DM  Outpatient Diabetes medications:  Novolin 70/30 25 units with breakfast and 20-25 units with supper Regular insulin 4-5 units with lunch Current orders for Inpatient glycemic control:  Novolog 0-9 units q 4 hours Novolog 70/30 25 units bid Novolog 4 units tid with meals Inpatient Diabetes Program Recommendations:    Spoke with Dr. Caleb Popp.  Recommend d/c of Novolog 4 units tid with meals (since patient restarted on 70/30) and also change Novolog correction to tid with meals and HS (instead of q 4 hours).  Looks like patient takes meal coverage at home for lunch as well.  Should be okay without it here since she is on Novolog correction. Orders received.  Will follow.   Thanks,  Beryl Meager, RN, BC-ADM Inpatient Diabetes Coordinator Pager [phone]  (8a-5p)

## 2022-09-21 NOTE — Care Management Important Message (Signed)
Important Message  Patient Details  Name: Rhonda Tyler MRN: [id number] Date of Birth: 22-Jun-1940   Medicare Important Message Given:  Yes     Rhonda Tyler Rhonda Tyler 09/21/2022, 2:25 PM

## 2022-09-22 ENCOUNTER — Encounter: Payer: Self-pay | Admitting: Oncology

## 2022-09-22 ENCOUNTER — Other Ambulatory Visit: Payer: Self-pay

## 2022-09-22 DIAGNOSIS — R531 Weakness: Secondary | ICD-10-CM | POA: Diagnosis not present

## 2022-09-22 DIAGNOSIS — S8992XA Unspecified injury of left lower leg, initial encounter: Secondary | ICD-10-CM | POA: Diagnosis not present

## 2022-09-22 DIAGNOSIS — J189 Pneumonia, unspecified organism: Secondary | ICD-10-CM | POA: Diagnosis not present

## 2022-09-22 DIAGNOSIS — M79605 Pain in left leg: Secondary | ICD-10-CM | POA: Diagnosis not present

## 2022-09-22 LAB — CBC WITH DIFFERENTIAL/PLATELET
Abs Immature Granulocytes: 0.12 10*3/uL — ABNORMAL HIGH (ref 0.00–0.07)
Basophils Absolute: 0 10*3/uL (ref 0.0–0.1)
Basophils Relative: 0 %
Eosinophils Absolute: 0.3 10*3/uL (ref 0.0–0.5)
Eosinophils Relative: 4 %
HCT: 31.7 % — ABNORMAL LOW (ref 36.0–46.0)
Hemoglobin: 10 g/dL — ABNORMAL LOW (ref 12.0–15.0)
Immature Granulocytes: 2 %
Lymphocytes Relative: 18 %
Lymphs Abs: 1.1 10*3/uL (ref 0.7–4.0)
MCH: 28.5 pg (ref 26.0–34.0)
MCHC: 31.5 g/dL (ref 30.0–36.0)
MCV: 90.3 fL (ref 80.0–100.0)
Monocytes Absolute: 0.4 10*3/uL (ref 0.1–1.0)
Monocytes Relative: 7 %
Neutro Abs: 4.3 10*3/uL (ref 1.7–7.7)
Neutrophils Relative %: 69 %
Platelets: 234 10*3/uL (ref 150–400)
RBC: 3.51 MIL/uL — ABNORMAL LOW (ref 3.87–5.11)
RDW: 14.2 % (ref 11.5–15.5)
WBC: 6.3 10*3/uL (ref 4.0–10.5)
nRBC: 0.3 % — ABNORMAL HIGH (ref 0.0–0.2)

## 2022-09-22 LAB — GLUCOSE, CAPILLARY
Glucose-Capillary: 66 mg/dL — ABNORMAL LOW (ref 70–99)
Glucose-Capillary: 144 mg/dL — ABNORMAL HIGH (ref 70–99)

## 2022-09-22 LAB — CK: Total CK: 14 U/L — ABNORMAL LOW (ref 38–234)

## 2022-09-22 LAB — MAGNESIUM: Magnesium: 1.8 mg/dL (ref 1.7–2.4)

## 2022-09-22 LAB — PHOSPHORUS: Phosphorus: 3.6 mg/dL (ref 2.5–4.6)

## 2022-09-22 MED ORDER — NOVOLIN 70/30 (70-30) 100 UNIT/ML ~~LOC~~ SUSP
SUBCUTANEOUS | 11 refills | Status: DC
  Filled 2022-09-22 (×2): qty 10, 20d supply, fill #0

## 2022-09-22 MED ORDER — HYDROCODONE-ACETAMINOPHEN 10-325 MG PO TABS: 1.0000 | ORAL_TABLET | ORAL | 0 refills | Status: DC | PRN

## 2022-09-22 MED ORDER — ALUM & MAG HYDROXIDE-SIMETH 200-200-20 MG/5ML PO SUSP
30.0000 mL | ORAL | 0 refills | Status: AC | PRN
  Filled 2022-09-22: qty 355, 2d supply, fill #0

## 2022-09-22 MED ORDER — POLYETHYLENE GLYCOL 3350 17 GM/SCOOP PO POWD
17.0000 g | Freq: Two times a day (BID) | ORAL | 0 refills | Status: AC
  Filled 2022-09-22: qty 238, 7d supply, fill #0

## 2022-09-22 MED ORDER — ASCORBIC ACID 500 MG PO TABS
500.0000 mg | ORAL_TABLET | Freq: Every day | ORAL | 0 refills | Status: AC
  Filled 2022-09-22: qty 30, 30d supply, fill #0

## 2022-09-22 NOTE — TOC Transition Note (Signed)
Transition of Care [facility]) - CM/SW Discharge Note   Patient Details  Name: Rhonda Tyler MRN: [id number] Date of Birth: [birth year]  Transition of Care [facility]) CM/SW Contact:  Allena Katz, LCSW Phone Number: 09/22/2022, 1:47 PM   Clinical Narrative:   Pt discharging to [facility]. RN given number for report. DC summary sent to facility. Pt would like to use Safe transport CSW to arrange. Gavin Pound at [facility] notified.     Final next level of care: Home w Home Health Services Barriers to Discharge: Continued Medical Work up   Patient Goals and CMS Choice CMS Medicare.gov Compare Post Acute Care list provided to:: Patient Choice offered to / list presented to : Patient  Discharge Placement                         Discharge Plan and Services Additional resources added to the After Visit Summary for                  DME Arranged: Walker rolling, 3-N-1         HH Arranged: PT, OT, RN, Nurse's Aide          Social Determinants of Health (SDOH) Interventions SDOH Screenings   Food Insecurity: No Food Insecurity (09/13/2022)  Housing: Low Risk  (09/13/2022)  Transportation Needs: No Transportation Needs (09/13/2022)  Utilities: Not At Risk (09/13/2022)  Tobacco Use: Medium Risk (09/13/2022)     Readmission Risk Interventions    09/15/2022   10:21 AM  Readmission Risk Prevention Plan  Transportation Screening Complete  PCP or Specialist Appt within 3-5 Days Complete  HRI or Home Care Consult Complete  Social Work Consult for Recovery Care Planning/Counseling Complete  Palliative Care Screening Complete  Medication Review Oceanographer) Complete

## 2022-09-22 NOTE — TOC Transition Note (Signed)
Transition of Care [facility]) - CM/SW Discharge Note   Patient Details  Name: Rhonda Tyler MRN: [id number] Date of Birth: [birth year]  Transition of Care [facility]) CM/SW Contact:  Allena Katz, LCSW Phone Number: 09/22/2022, 1:15 PM   Clinical Narrative:  Pt has orders to discharge to [facility], de[facility] with [facility] notified. Medical necessity printed to unit. RN given number for report. CSW will send dc summary once in.       Final next level of care: Home w Home Health Services Barriers to Discharge: Continued Medical Work up   Patient Goals and CMS Choice CMS Medicare.gov Compare Post Acute Care list provided to:: Patient Choice offered to / list presented to : Patient  Discharge Placement                         Discharge Plan and Services Additional resources added to the After Visit Summary for                  DME Arranged: Walker rolling, 3-N-1         HH Arranged: PT, OT, RN, Nurse's Aide          Social Determinants of Health (SDOH) Interventions SDOH Screenings   Food Insecurity: No Food Insecurity (09/13/2022)  Housing: Low Risk  (09/13/2022)  Transportation Needs: No Transportation Needs (09/13/2022)  Utilities: Not At Risk (09/13/2022)  Tobacco Use: Medium Risk (09/13/2022)     Readmission Risk Interventions    09/15/2022   10:21 AM  Readmission Risk Prevention Plan  Transportation Screening Complete  PCP or Specialist Appt within 3-5 Days Complete  HRI or Home Care Consult Complete  Social Work Consult for Recovery Care Planning/Counseling Complete  Palliative Care Screening Complete  Medication Review Oceanographer) Complete

## 2022-09-22 NOTE — Inpatient Diabetes Management (Signed)
Inpatient Diabetes Program Recommendations  AACE/ADA: New Consensus Statement on Inpatient Glycemic Control (2015)  Target Ranges:  Prepandial:   less than 140 mg/dL      Peak postprandial:   less than 180 mg/dL (1-2 hours)      Critically ill patients:  140 - 180 mg/dL    Latest Reference Range & Units 09/21/22 08:33 09/21/22 12:08 09/21/22 17:17 09/21/22 21:43  Glucose-Capillary 70 - 99 mg/dL 161 (H) 94 096 (H) 82  (H): Data is abnormally high  Latest Reference Range & Units 09/22/22 07:52  Glucose-Capillary 70 - 99 mg/dL 66 (L)  (L): Data is abnormally low     Home DM Meds: Novolin 70/30 25 units with breakfast and 20-25 units with supper     Regular insulin 4-5 units with lunch   Current Orders: 70/30 Insulin 25 units BID      Novolog Sensitive Correction Scale/ SSI (0-9 units) TID AC + HS    MD- Note Hypoglycemia this AM (CBG 66)  Received 25 units 70/30 Insulin last PM  May consider reducing the 70/30 Insulin to 18 units BID (30% reduction)    --Will follow patient during hospitalization--  Ambrose Finland RN, MSN, CDCES Diabetes Coordinator Inpatient Glycemic Control Team Team Pager: [phone] (8a-5p)

## 2022-09-23 ENCOUNTER — Other Ambulatory Visit: Payer: Self-pay

## 2022-10-09 ENCOUNTER — Other Ambulatory Visit: Payer: Self-pay

## 2022-10-14 ENCOUNTER — Encounter: Payer: Self-pay | Admitting: Oncology

## 2022-10-14 ENCOUNTER — Ambulatory Visit
Admission: RE | Admit: 2022-10-14 | Discharge: 2022-10-14 | Disposition: A | Discharge status: Home / Self Care | Payer: Medicare Other | Source: Ambulatory Visit | Attending: Internal Medicine | Admitting: Internal Medicine

## 2022-10-14 ENCOUNTER — Other Ambulatory Visit: Payer: Self-pay | Admitting: Internal Medicine

## 2022-10-14 DIAGNOSIS — R600 Localized edema: Secondary | ICD-10-CM | POA: Diagnosis present

## 2022-10-19 ENCOUNTER — Encounter: Payer: Self-pay | Admitting: Oncology

## 2022-10-29 ENCOUNTER — Emergency Department: Payer: Medicare Other

## 2022-10-29 ENCOUNTER — Inpatient Hospital Stay
Admission: EM | Admit: 2022-10-29 | Discharge: 2022-11-05 | DRG: 682 | Disposition: A | Discharge status: Skilled Nursing Facility | Payer: Medicare Other | Attending: Internal Medicine | Admitting: Internal Medicine

## 2022-10-29 ENCOUNTER — Encounter: Payer: Self-pay | Admitting: Internal Medicine

## 2022-10-29 ENCOUNTER — Other Ambulatory Visit: Payer: Self-pay

## 2022-10-29 DIAGNOSIS — I130 Hypertensive heart and chronic kidney disease with heart failure and stage 1 through stage 4 chronic kidney disease, or unspecified chronic kidney disease: Secondary | ICD-10-CM | POA: Diagnosis present

## 2022-10-29 DIAGNOSIS — T426X5A Adverse effect of other antiepileptic and sedative-hypnotic drugs, initial encounter: Secondary | ICD-10-CM | POA: Diagnosis not present

## 2022-10-29 DIAGNOSIS — M4802 Spinal stenosis, cervical region: Secondary | ICD-10-CM | POA: Diagnosis present

## 2022-10-29 DIAGNOSIS — I25810 Atherosclerosis of coronary artery bypass graft(s) without angina pectoris: Secondary | ICD-10-CM | POA: Diagnosis present

## 2022-10-29 DIAGNOSIS — Z90710 Acquired absence of both cervix and uterus: Secondary | ICD-10-CM

## 2022-10-29 DIAGNOSIS — Z794 Long term (current) use of insulin: Secondary | ICD-10-CM

## 2022-10-29 DIAGNOSIS — D72829 Elevated white blood cell count, unspecified: Secondary | ICD-10-CM

## 2022-10-29 DIAGNOSIS — S40029A Contusion of unspecified upper arm, initial encounter: Secondary | ICD-10-CM | POA: Diagnosis present

## 2022-10-29 DIAGNOSIS — I2510 Atherosclerotic heart disease of native coronary artery without angina pectoris: Secondary | ICD-10-CM | POA: Diagnosis present

## 2022-10-29 DIAGNOSIS — N1411 Contrast-induced nephropathy: Secondary | ICD-10-CM | POA: Diagnosis present

## 2022-10-29 DIAGNOSIS — I10 Essential (primary) hypertension: Secondary | ICD-10-CM | POA: Diagnosis present

## 2022-10-29 DIAGNOSIS — S42202A Unspecified fracture of upper end of left humerus, initial encounter for closed fracture: Secondary | ICD-10-CM | POA: Diagnosis present

## 2022-10-29 DIAGNOSIS — S0990XA Unspecified injury of head, initial encounter: Secondary | ICD-10-CM | POA: Diagnosis present

## 2022-10-29 DIAGNOSIS — Z96652 Presence of left artificial knee joint: Secondary | ICD-10-CM | POA: Diagnosis present

## 2022-10-29 DIAGNOSIS — G4733 Obstructive sleep apnea (adult) (pediatric): Secondary | ICD-10-CM

## 2022-10-29 DIAGNOSIS — K7581 Nonalcoholic steatohepatitis (NASH): Secondary | ICD-10-CM | POA: Diagnosis present

## 2022-10-29 DIAGNOSIS — E1140 Type 2 diabetes mellitus with diabetic neuropathy, unspecified: Secondary | ICD-10-CM | POA: Diagnosis present

## 2022-10-29 DIAGNOSIS — N178 Other acute kidney failure: Principal | ICD-10-CM | POA: Diagnosis present

## 2022-10-29 DIAGNOSIS — S42309A Unspecified fracture of shaft of humerus, unspecified arm, initial encounter for closed fracture: Secondary | ICD-10-CM

## 2022-10-29 DIAGNOSIS — T508X5A Adverse effect of diagnostic agents, initial encounter: Secondary | ICD-10-CM | POA: Diagnosis present

## 2022-10-29 DIAGNOSIS — Z951 Presence of aortocoronary bypass graft: Secondary | ICD-10-CM

## 2022-10-29 DIAGNOSIS — I5032 Chronic diastolic (congestive) heart failure: Secondary | ICD-10-CM | POA: Insufficient documentation

## 2022-10-29 DIAGNOSIS — I959 Hypotension, unspecified: Secondary | ICD-10-CM | POA: Diagnosis present

## 2022-10-29 DIAGNOSIS — Z885 Allergy status to narcotic agent status: Secondary | ICD-10-CM

## 2022-10-29 DIAGNOSIS — G928 Other toxic encephalopathy: Secondary | ICD-10-CM | POA: Diagnosis not present

## 2022-10-29 DIAGNOSIS — Z881 Allergy status to other antibiotic agents status: Secondary | ICD-10-CM

## 2022-10-29 DIAGNOSIS — E871 Hypo-osmolality and hyponatremia: Secondary | ICD-10-CM | POA: Diagnosis not present

## 2022-10-29 DIAGNOSIS — Z888 Allergy status to other drugs, medicaments and biological substances status: Secondary | ICD-10-CM

## 2022-10-29 DIAGNOSIS — R253 Fasciculation: Secondary | ICD-10-CM

## 2022-10-29 DIAGNOSIS — E860 Dehydration: Secondary | ICD-10-CM | POA: Diagnosis present

## 2022-10-29 DIAGNOSIS — R9431 Abnormal electrocardiogram [ECG] [EKG]: Secondary | ICD-10-CM | POA: Diagnosis present

## 2022-10-29 DIAGNOSIS — R296 Repeated falls: Secondary | ICD-10-CM | POA: Diagnosis present

## 2022-10-29 DIAGNOSIS — W19XXXA Unspecified fall, initial encounter: Principal | ICD-10-CM

## 2022-10-29 DIAGNOSIS — Z9841 Cataract extraction status, right eye: Secondary | ICD-10-CM

## 2022-10-29 DIAGNOSIS — I252 Old myocardial infarction: Secondary | ICD-10-CM

## 2022-10-29 DIAGNOSIS — Y92239 Unspecified place in hospital as the place of occurrence of the external cause: Secondary | ICD-10-CM | POA: Diagnosis present

## 2022-10-29 DIAGNOSIS — Z955 Presence of coronary angioplasty implant and graft: Secondary | ICD-10-CM

## 2022-10-29 DIAGNOSIS — Z86010 Personal history of colonic polyps: Secondary | ICD-10-CM

## 2022-10-29 DIAGNOSIS — D509 Iron deficiency anemia, unspecified: Secondary | ICD-10-CM | POA: Diagnosis present

## 2022-10-29 DIAGNOSIS — I509 Heart failure, unspecified: Secondary | ICD-10-CM

## 2022-10-29 DIAGNOSIS — W1830XA Fall on same level, unspecified, initial encounter: Secondary | ICD-10-CM | POA: Diagnosis present

## 2022-10-29 DIAGNOSIS — Z87891 Personal history of nicotine dependence: Secondary | ICD-10-CM

## 2022-10-29 DIAGNOSIS — Z961 Presence of intraocular lens: Secondary | ICD-10-CM | POA: Diagnosis present

## 2022-10-29 DIAGNOSIS — E1122 Type 2 diabetes mellitus with diabetic chronic kidney disease: Secondary | ICD-10-CM | POA: Diagnosis present

## 2022-10-29 DIAGNOSIS — Z880 Allergy status to penicillin: Secondary | ICD-10-CM

## 2022-10-29 DIAGNOSIS — Z808 Family history of malignant neoplasm of other organs or systems: Secondary | ICD-10-CM

## 2022-10-29 DIAGNOSIS — Z9842 Cataract extraction status, left eye: Secondary | ICD-10-CM

## 2022-10-29 DIAGNOSIS — I440 Atrioventricular block, first degree: Secondary | ICD-10-CM | POA: Diagnosis present

## 2022-10-29 DIAGNOSIS — Z79899 Other long term (current) drug therapy: Secondary | ICD-10-CM

## 2022-10-29 DIAGNOSIS — Z8249 Family history of ischemic heart disease and other diseases of the circulatory system: Secondary | ICD-10-CM

## 2022-10-29 DIAGNOSIS — N189 Chronic kidney disease, unspecified: Secondary | ICD-10-CM | POA: Diagnosis present

## 2022-10-29 DIAGNOSIS — E785 Hyperlipidemia, unspecified: Secondary | ICD-10-CM | POA: Diagnosis present

## 2022-10-29 DIAGNOSIS — S42002A Fracture of unspecified part of left clavicle, initial encounter for closed fracture: Secondary | ICD-10-CM | POA: Diagnosis present

## 2022-10-29 DIAGNOSIS — Z882 Allergy status to sulfonamides status: Secondary | ICD-10-CM

## 2022-10-29 DIAGNOSIS — K7460 Unspecified cirrhosis of liver: Secondary | ICD-10-CM | POA: Diagnosis present

## 2022-10-29 DIAGNOSIS — Y92009 Unspecified place in unspecified non-institutional (private) residence as the place of occurrence of the external cause: Secondary | ICD-10-CM

## 2022-10-29 DIAGNOSIS — T3995XA Adverse effect of unspecified nonopioid analgesic, antipyretic and antirheumatic, initial encounter: Secondary | ICD-10-CM | POA: Diagnosis not present

## 2022-10-29 DIAGNOSIS — N1832 Chronic kidney disease, stage 3b: Secondary | ICD-10-CM | POA: Diagnosis present

## 2022-10-29 DIAGNOSIS — N179 Acute kidney failure, unspecified: Secondary | ICD-10-CM | POA: Diagnosis present

## 2022-10-29 DIAGNOSIS — G9341 Metabolic encephalopathy: Secondary | ICD-10-CM | POA: Insufficient documentation

## 2022-10-29 DIAGNOSIS — Z9049 Acquired absence of other specified parts of digestive tract: Secondary | ICD-10-CM

## 2022-10-29 DIAGNOSIS — K219 Gastro-esophageal reflux disease without esophagitis: Secondary | ICD-10-CM | POA: Diagnosis present

## 2022-10-29 LAB — COMPREHENSIVE METABOLIC PANEL
ALT: 16 U/L (ref 0–44)
AST: 28 U/L (ref 15–41)
Albumin: 4 g/dL (ref 3.5–5.0)
Alkaline Phosphatase: 124 U/L (ref 38–126)
Anion gap: 14 (ref 5–15)
BUN: 34 mg/dL — ABNORMAL HIGH (ref 8–23)
CO2: 22 mmol/L (ref 22–32)
Calcium: 9.6 mg/dL (ref 8.9–10.3)
Chloride: 93 mmol/L — ABNORMAL LOW (ref 98–111)
Creatinine, Ser: 1.36 mg/dL — ABNORMAL HIGH (ref 0.44–1.00)
GFR, Estimated: 40 mL/min — ABNORMAL LOW (ref >60)
Glucose, Bld: 94 mg/dL (ref 70–99)
Potassium: 4.2 mmol/L (ref 3.5–5.1)
Sodium: 129 mmol/L — ABNORMAL LOW (ref 135–145)
Total Bilirubin: 1.7 mg/dL — ABNORMAL HIGH (ref 0.3–1.2)
Total Protein: 7.1 g/dL (ref 6.5–8.1)

## 2022-10-29 LAB — CBG MONITORING, ED: Glucose-Capillary: 101 mg/dL — ABNORMAL HIGH (ref 70–99)

## 2022-10-29 LAB — TROPONIN I (HIGH SENSITIVITY)
Troponin I (High Sensitivity): 14 ng/L (ref <18)
Troponin I (High Sensitivity): 14 ng/L (ref <18)

## 2022-10-29 LAB — CBC
HCT: 37.2 % (ref 36.0–46.0)
Hemoglobin: 12.3 g/dL (ref 12.0–15.0)
MCH: 27.4 pg (ref 26.0–34.0)
MCHC: 33.1 g/dL (ref 30.0–36.0)
MCV: 82.9 fL (ref 80.0–100.0)
Platelets: 207 10*3/uL (ref 150–400)
RBC: 4.49 MIL/uL (ref 3.87–5.11)
RDW: 14.1 % (ref 11.5–15.5)
WBC: 10.8 10*3/uL — ABNORMAL HIGH (ref 4.0–10.5)
nRBC: 0 % (ref 0.0–0.2)

## 2022-10-29 LAB — GLUCOSE, CAPILLARY: Glucose-Capillary: 91 mg/dL (ref 70–99)

## 2022-10-29 MED ORDER — ACETAMINOPHEN 325 MG PO TABS
650.0000 mg | ORAL_TABLET | Freq: Four times a day (QID) | ORAL | Status: DC | PRN
  Administered 2022-10-31: 650 mg via ORAL
  Filled 2022-10-29: qty 2

## 2022-10-29 MED ORDER — FENOFIBRATE 160 MG PO TABS
160.0000 mg | ORAL_TABLET | Freq: Every day | ORAL | Status: DC
  Administered 2022-10-30 – 2022-11-05 (×8): 160 mg via ORAL
  Filled 2022-10-29 (×8): qty 1

## 2022-10-29 MED ORDER — ONDANSETRON HCL 4 MG/2ML IJ SOLN
4.0000 mg | Freq: Four times a day (QID) | INTRAMUSCULAR | Status: DC | PRN
  Administered 2022-10-30 – 2022-11-05 (×4): 4 mg via INTRAVENOUS
  Filled 2022-10-29 (×4): qty 2

## 2022-10-29 MED ORDER — PROPRANOLOL HCL ER 60 MG PO CP24
60.0000 mg | ORAL_CAPSULE | Freq: Every day | ORAL | Status: DC
  Administered 2022-10-30 – 2022-10-31 (×2): 60 mg via ORAL
  Filled 2022-10-29 (×3): qty 1

## 2022-10-29 MED ORDER — SODIUM CHLORIDE 0.9 % IV BOLUS
1000.0000 mL | Freq: Once | INTRAVENOUS | Status: AC
  Administered 2022-10-29: 1000 mL via INTRAVENOUS

## 2022-10-29 MED ORDER — SPIRONOLACTONE 25 MG PO TABS: 50.0000 mg | ORAL_TABLET | Freq: Every day | ORAL | Status: DC

## 2022-10-29 MED ORDER — INSULIN ASPART 100 UNIT/ML IJ SOLN: 0.0000 [IU] | Freq: Three times a day (TID) | INTRAMUSCULAR | Status: DC

## 2022-10-29 MED ORDER — LISINOPRIL 10 MG PO TABS
5.0000 mg | ORAL_TABLET | Freq: Every day | ORAL | Status: DC
  Filled 2022-10-29: qty 1

## 2022-10-29 MED ORDER — INSULIN ASPART 100 UNIT/ML IJ SOLN
0.0000 [IU] | Freq: Three times a day (TID) | INTRAMUSCULAR | Status: DC
  Administered 2022-10-30: 8 [IU] via SUBCUTANEOUS
  Administered 2022-10-30: 5 [IU] via SUBCUTANEOUS
  Administered 2022-10-31 (×3): 8 [IU] via SUBCUTANEOUS
  Administered 2022-11-01 (×2): 11 [IU] via SUBCUTANEOUS
  Administered 2022-11-01 – 2022-11-02 (×4): 5 [IU] via SUBCUTANEOUS
  Administered 2022-11-03: 8 [IU] via SUBCUTANEOUS
  Administered 2022-11-03 (×2): 5 [IU] via SUBCUTANEOUS
  Administered 2022-11-04: 3 [IU] via SUBCUTANEOUS
  Administered 2022-11-04: 5 [IU] via SUBCUTANEOUS
  Administered 2022-11-04: 3 [IU] via SUBCUTANEOUS
  Administered 2022-11-05 (×2): 5 [IU] via SUBCUTANEOUS
  Filled 2022-10-29 (×19): qty 1

## 2022-10-29 MED ORDER — POLYETHYLENE GLYCOL 3350 17 G PO PACK: 17.0000 g | PACK | Freq: Every day | ORAL | Status: DC | PRN

## 2022-10-29 MED ORDER — LORATADINE 10 MG PO TABS
10.0000 mg | ORAL_TABLET | Freq: Every day | ORAL | Status: DC
  Administered 2022-10-30 – 2022-11-05 (×8): 10 mg via ORAL
  Filled 2022-10-29 (×8): qty 1

## 2022-10-29 MED ORDER — FENTANYL CITRATE PF 50 MCG/ML IJ SOSY
100.0000 ug | PREFILLED_SYRINGE | Freq: Once | INTRAMUSCULAR | Status: AC
  Administered 2022-10-29: 100 ug via INTRAVENOUS
  Filled 2022-10-29: qty 2

## 2022-10-29 MED ORDER — SODIUM CHLORIDE 0.9% FLUSH
3.0000 mL | Freq: Two times a day (BID) | INTRAVENOUS | Status: DC
  Administered 2022-10-29 – 2022-11-05 (×13): 3 mL via INTRAVENOUS

## 2022-10-29 MED ORDER — GABAPENTIN 100 MG PO CAPS
100.0000 mg | ORAL_CAPSULE | Freq: Three times a day (TID) | ORAL | Status: DC
  Administered 2022-10-30 – 2022-10-31 (×5): 100 mg via ORAL
  Filled 2022-10-29 (×6): qty 1

## 2022-10-29 MED ORDER — IOHEXOL 300 MG/ML  SOLN
75.0000 mL | Freq: Once | INTRAMUSCULAR | Status: AC | PRN
  Administered 2022-10-29: 75 mL via INTRAVENOUS

## 2022-10-29 MED ORDER — ACETAMINOPHEN 650 MG RE SUPP: 650.0000 mg | Freq: Four times a day (QID) | RECTAL | Status: DC | PRN

## 2022-10-29 MED ORDER — PANTOPRAZOLE SODIUM 40 MG PO TBEC
40.0000 mg | DELAYED_RELEASE_TABLET | Freq: Two times a day (BID) | ORAL | Status: DC
  Administered 2022-10-30 – 2022-11-05 (×14): 40 mg via ORAL
  Filled 2022-10-29 (×14): qty 1

## 2022-10-29 MED ORDER — DILTIAZEM HCL ER COATED BEADS 120 MG PO CP24
120.0000 mg | ORAL_CAPSULE | Freq: Every day | ORAL | Status: DC
  Administered 2022-10-30 – 2022-11-05 (×6): 120 mg via ORAL
  Filled 2022-10-29 (×8): qty 1

## 2022-10-29 MED ORDER — SERTRALINE HCL 50 MG PO TABS
100.0000 mg | ORAL_TABLET | Freq: Every day | ORAL | Status: DC
  Administered 2022-10-30 – 2022-11-05 (×8): 100 mg via ORAL
  Filled 2022-10-29 (×10): qty 2

## 2022-10-29 MED ORDER — ONDANSETRON HCL 4 MG PO TABS
4.0000 mg | ORAL_TABLET | Freq: Four times a day (QID) | ORAL | Status: DC | PRN
  Administered 2022-11-03: 4 mg via ORAL
  Filled 2022-10-29: qty 1

## 2022-10-29 MED ORDER — HYDROMORPHONE HCL 1 MG/ML IJ SOLN
0.5000 mg | INTRAMUSCULAR | Status: DC | PRN
  Administered 2022-10-29 – 2022-10-31 (×3): 0.5 mg via INTRAVENOUS
  Filled 2022-10-29 (×5): qty 0.5

## 2022-10-29 MED ORDER — HYDROCODONE-ACETAMINOPHEN 5-325 MG PO TABS
1.0000 | ORAL_TABLET | Freq: Four times a day (QID) | ORAL | Status: DC | PRN
  Administered 2022-10-29: 1 via ORAL
  Administered 2022-10-30 – 2022-10-31 (×4): 2 via ORAL
  Filled 2022-10-29 (×2): qty 2
  Filled 2022-10-29: qty 1
  Filled 2022-10-29 (×2): qty 2

## 2022-10-29 MED ORDER — FENTANYL CITRATE PF 50 MCG/ML IJ SOSY
50.0000 ug | PREFILLED_SYRINGE | Freq: Once | INTRAMUSCULAR | Status: AC
  Administered 2022-10-29: 50 ug via INTRAVENOUS
  Filled 2022-10-29: qty 1

## 2022-10-29 NOTE — Assessment & Plan Note (Deleted)
With creatinine going up to 1.65 and GFR down to 31 will hold lisinopril spironolactone and diuretic today.

## 2022-10-29 NOTE — Assessment & Plan Note (Addendum)
Patient on propranolol

## 2022-10-29 NOTE — Assessment & Plan Note (Signed)
-   CPAP at bedtime 

## 2022-10-29 NOTE — Assessment & Plan Note (Addendum)
Sodium 131.  Will give gentle fluids overnight.

## 2022-10-29 NOTE — ED Notes (Signed)
Pt placed all her jewelry in a bag with a pt label. Pt states all her items are accounted for.

## 2022-10-29 NOTE — ED Notes (Signed)
Assumed care for patient at this time.

## 2022-10-29 NOTE — ED Notes (Addendum)
At this time, patient in bed moaning. Patient states pain is a little better from repositioning, but still hurts. Patient is quick to fall asleep during assessment.  BP: 97/57.

## 2022-10-29 NOTE — Assessment & Plan Note (Addendum)
Physical therapy recommending rehab 

## 2022-10-29 NOTE — ED Provider Notes (Signed)
[facility] Provider Note    Event Date/Time   First MD Initiated Contact with Patient 10/29/22 1104     (approximate)   History   Fall   HPI  Rhonda Tyler is a 82 y.o. female past medical history significant for Elita Boone cirrhosis, diabetes, hypertension, GERD, recurrent falls, who presents to the emergency department following an episode of dizziness and feeling off balance and then had a fall landing on her left side.  Unable to ambulate since that time.  Needed assistance with EMS to get up.  Not on anticoagulation.  Did hit her head and neck.  Denies loss of consciousness.  Denies any preceding chest pain or shortness of breath.  Complaining of severe pain to her left arm and shoulder.  Fall 3 weeks ago and was evaluated in the hospital at that time.     Physical Exam   Triage Vital Signs: ED Triage Vitals  Enc Vitals Group     BP 10/29/22 1126 (!) 148/79     Pulse Rate 10/29/22 1126 98     Resp --      Temp 10/29/22 1126 98 F (36.7 C)     Temp Source 10/29/22 1126 Oral     SpO2 10/29/22 1126 99 %     Weight 10/29/22 1128 158 lb (71.7 kg)     Height 10/29/22 1128 5\' 3"  (1.6 m)     Head Circumference --      Peak Flow --      Pain Score 10/29/22 1127 10     Pain Loc --      Pain Edu? --      Excl. in GC? --     Most recent vital signs: Vitals:   10/29/22 1126 10/29/22 1441  BP: (!) 148/79 (!) 127/55  Pulse: 98 85  Resp:  16  Temp: 98 F (36.7 C)   SpO2: 99% 100%    Physical Exam HENT:     Head: Atraumatic.     Right Ear: External ear normal.     Left Ear: External ear normal.  Eyes:     Extraocular Movements: Extraocular movements intact.     Pupils: Pupils are equal, round, and reactive to light.  Neck:     Comments: Cervical collar in place Cardiovascular:     Rate and Rhythm: Normal rate.     Comments: Left-sided chest wall tenderness to palpation with deformity and swelling Pulmonary:     Effort: No respiratory  distress.  Abdominal:     General: There is no distension.  Musculoskeletal:        General: Swelling and tenderness present.     Cervical back: No tenderness.     Comments: Left shoulder with obvious deformity and swelling, ecchymosis.  Skin tear to the left elbow.  No tenderness to the left elbow, forearm or wrist.  +2 radial pulses.  Good grip strength.  Mild tenderness to bilateral needs.  Able to fully flex and extend.  No tenderness to her hips or lower extremities.  Lymphadenopathy:     Cervical: No cervical adenopathy.  Neurological:     General: No focal deficit present.     Mental Status: She is alert.     IMPRESSION / MDM / ASSESSMENT AND PLAN / ED COURSE  I reviewed the triage vital signs and the nursing notes.  On arrival to the emergency department patient in obvious distress.  Obvious deformity to the left upper extremity.  Differential  diagnosis including fracture, dislocation, musculoskeletal strain.  Have a low suspicion for open fracture.  Concern for intracranial hemorrhage or cervical spine injury.  Patient did have a dizziness episode, possible orthostatic hypotension, dehydration or anemia.  Possible ACS but no active chest pain at this time.  Did not have a syncopal episode.  EKG  I, Corena Herter, the attending physician, personally viewed and interpreted this ECG.   Rate: Normal  Rhythm: Normal sinus  Axis: Normal  Intervals: Normal  ST&T Change: None  No tachycardic or bradycardic dysrhythmias while on cardiac telemetry.  RADIOLOGY I independently reviewed imaging, my interpretation of imaging: CT of the chest with concern for clavicle fracture.  Also noted a left proximal humerus fracture.  Called and discussed these readings with the radiologist.  Concern for subacute fractures to T8/T9  LABS (all labs ordered are listed, but only abnormal results are displayed) Labs interpreted as -    Labs Reviewed  CBC - Abnormal; Notable for the following  components:      Result Value   WBC 10.8 (*)    All other components within normal limits  COMPREHENSIVE METABOLIC PANEL - Abnormal; Notable for the following components:   Sodium 129 (*)    Chloride 93 (*)    BUN 34 (*)    Creatinine, Ser 1.36 (*)    Total Bilirubin 1.7 (*)    GFR, Estimated 40 (*)    All other components within normal limits  TROPONIN I (HIGH SENSITIVITY)  TROPONIN I (HIGH SENSITIVITY)     MDM  Discussed the patient's case with orthopedics Dr. Lesleigh Noe who recommended a left arm immobilizer.  Plan to admit the patient to the hospital for off-balance, weakness and fall.  Requiring multiple doses of IV pain medication for pain control.  Patient is neurovascularly intact.  Cervical spine was cleared and have a low suspicion for ligamentous injury.  Do not feel the MRI is necessary at this time.  Multiple doses of IV pain medication.  Lab work with hyponatremia with a sodium level of 129 from a baseline of 130s.  Creatinine at baseline.  No significant electrolyte abnormalities.  Troponin negative have low suspicion for ACS.  Signs of dehydration, given 1 L of IV fluids.  Consulted hospitalist for admission for hyponatremia, dizziness episode and pain control for left clavicle and humerus fracture.  Skin tear repaired in the emergency department.  Tetanus is up-to-date.  Placed in the left arm immobilizer.     PROCEDURES:  Critical Care performed: No  Procedures  Patient's presentation is most consistent with acute presentation with potential threat to life or bodily function.   MEDICATIONS ORDERED IN ED: Medications  sodium chloride 0.9 % bolus 1,000 mL (has no administration in time range)  fentaNYL (SUBLIMAZE) injection 50 mcg (50 mcg Intravenous Given 10/29/22 1133)  fentaNYL (SUBLIMAZE) injection 100 mcg (100 mcg Intravenous Given 10/29/22 1236)  iohexol (OMNIPAQUE) 300 MG/ML solution 75 mL (75 mLs Intravenous Contrast Given 10/29/22 1218)  fentaNYL  (SUBLIMAZE) injection 100 mcg (100 mcg Intravenous Given 10/29/22 1401)    FINAL CLINICAL IMPRESSION(S) / ED DIAGNOSES   Final diagnoses:  Fall, initial encounter  Injury of head, initial encounter  Closed fracture of proximal end of left humerus, unspecified fracture morphology, initial encounter  Closed displaced fracture of left clavicle, unspecified part of clavicle, initial encounter  Hyponatremia     Rx / DC Orders   ED Discharge Orders     None  Note:  This document was prepared using Dragon voice recognition software and may include unintentional dictation errors.   Corena Herter, MD 10/29/22 1447

## 2022-10-29 NOTE — Assessment & Plan Note (Addendum)
Continue low-dose insulin.

## 2022-10-29 NOTE — Assessment & Plan Note (Addendum)
Likely reactive from fall

## 2022-10-29 NOTE — H&P (Signed)
History and Physical    Patient: Rhonda Tyler:[id number] DOB: 1940/06/03 DOA: 10/29/2022 DOS: the patient was seen and examined on 10/29/2022 PCP: Lauro Regulus, MD  Patient coming from: Home  Chief Complaint:  Chief Complaint  Patient presents with   Fall   HPI: Rhonda Tyler is a 82 y.o. female with medical history significant of cirrhosis 2/2 NASH, CKD stage IIIb, type 2 diabetes, hypertension, hypertension, hyperlipidemia, CAD, sleep apnea, who presents to the ED due to a ground-level fall.  Rhonda Tyler states she was in her usual state of health this morning when her glucose monitor started beeping that her sugar was low but she cannot recall what it was.  She started walking to her laundry room to get some clothing before going to the kitchen.  She turned and believes she may have lost her footing falling down onto her knees and then landing onto her left shoulder.  She states that she did try to catch herself but was unable to she did end up hitting the back of her head as well but denies any loss of consciousness.  Prior to the fall, she is uncertain if she was experiencing any dizziness but does not believe so.  She denies any palpitations prior to the fall but notes that every once in a while, she has been experiencing palpitations over the last several months.  She denies any recent shortness of breath, lower extremity swelling, chest pain, vision changes, recent illness, N/V/D.   ED course: On arrival to the ED, patient was hypertensive at 148/79 with heart rate of 98.  She was saturating at 99% on room air.  She was afebrile at 98.  Initial workup demonstrated WBC of 10.8, sodium of 129, BUN 34, creatinine 1.36 with GFR 40.  Troponin negative x 2.  CT of the head, chest, abdomen, C-spine obtained.  Only notable for acute clavicular fracture and impacted fracture of the left proximal humerus.  Bilateral knee x-rays were obtained with no evidence of acute findings.  Orthopedic  surgery consulted.  [facility] contacted for admission.  Review of Systems: As mentioned in the history of present illness. All other systems reviewed and are negative.  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   Colon polyp    Coronary artery disease    Cutaneous sarcoidosis    Diabetes mellitus without complication (HCC)    Pt takes insulin   Diabetic neuropathy (HCC)    GERD (gastroesophageal reflux disease)    Headache    migaines in the past   Hemorrhoid    History of diabetic neuropathy    Hypercalcemia    Hyperlipemia    Hypertension    IBS (irritable bowel syndrome)    Iron deficiency anemia    Myocardial infarction (HCC)    Pneumonia    Sleep apnea    NO CPAP   Past Surgical History:  Procedure Laterality Date   ABDOMINAL HYSTERECTOMY     arthroscopic rotator cuff repair     BACK SURGERY     CARDIAC CATHETERIZATION     CATARACT EXTRACTION W/PHACO Right 01/26/2022   Procedure: CATARACT EXTRACTION PHACO AND INTRAOCULAR LENS PLACEMENT (IOC) RIGHT DIABETIC;  Surgeon: Willey Blade  Loraine Leriche, MD;  Location: [facility];  Service: Ophthalmology;  Laterality: Right;  2.90 0:29.3   CATARACT EXTRACTION W/PHACO Left 02/09/2022   Procedure: CATARACT EXTRACTION PHACO AND INTRAOCULAR LENS PLACEMENT (IOC) LEFT DIABETIC 3.54 00:32.3;  Surgeon: Nevada Crane, MD;  Location: [facility];  Service: Ophthalmology;  Laterality: Left;  Diabetic   CHOLECYSTECTOMY     COLONOSCOPY     COLONOSCOPY WITH PROPOFOL N/A 11/29/2017   Procedure: COLONOSCOPY WITH PROPOFOL;  Surgeon: Christena Deem, MD;  Location: [facility];  Service: Endoscopy;  Laterality: N/A;   CORONARY ANGIOPLASTY     PTCA and stent of RCA   CORONARY ARTERY BYPASS GRAFT      ESOPHAGOGASTRODUODENOSCOPY     ESOPHAGOGASTRODUODENOSCOPY (EGD) WITH PROPOFOL N/A 11/29/2017   Procedure: ESOPHAGOGASTRODUODENOSCOPY (EGD) WITH PROPOFOL;  Surgeon: Christena Deem, MD;  Location: [facility] ENDOSCOPY;  Service: Endoscopy;  Laterality: N/A;   ESOPHAGOGASTRODUODENOSCOPY (EGD) WITH PROPOFOL N/A 03/07/2018   Procedure: ESOPHAGOGASTRODUODENOSCOPY (EGD) WITH PROPOFOL;  Surgeon: Christena Deem, MD;  Location: [facility];  Service: Endoscopy;  Laterality: N/A;   ESOPHAGOGASTRODUODENOSCOPY (EGD) WITH PROPOFOL N/A 08/16/2019   Procedure: ESOPHAGOGASTRODUODENOSCOPY (EGD) WITH PROPOFOL;  Surgeon: Toledo, Boykin Nearing, MD;  Location: [facility] ENDOSCOPY;  Service: Gastroenterology;  Laterality: N/A;   EXCISION OF ADNEXAL MASS     JOINT REPLACEMENT  04/2021   left total knee replacement   OOPHORECTOMY     TOTAL KNEE REVISION Left 05/07/2021   Procedure: TOTAL KNEE REVISION;  Surgeon: Donato Heinz, MD;  Location: [facility] ORS;  Service: Orthopedics;  Laterality: Left;   vesicular vaginal fistula     Social History:  reports that she quit smoking about 51 years ago. Her smoking use included cigarettes. She has never used smokeless tobacco. She reports that she does not currently use alcohol. She reports that she does not use drugs.  Allergies  Allergen Reactions   Levaquin [Levofloxacin In D5w] Shortness Of Breath   Penicillins Hives   Codeine Other (See Comments)    Hallucinate   Lipitor [Atorvastatin Calcium] Other (See Comments)    Muscle pain   Lopressor [Metoprolol Tartrate] Other (See Comments)    Heart races   Potassium-Containing Compounds Other (See Comments)     Facial flushing   Procardia [Nifedipine] Other (See Comments)    Heart races   Sucralfate Nausea Only   Tramadol Other (See Comments)    Confusion   Allegra [Fexofenadine] Rash   Naltrexone Other (See Comments)    Severe headache, nausea, "body flashes".    Sulfa Antibiotics Rash    Family History  Problem  Relation Age of Onset   Hypertension Mother    Heart attack Mother    Hypertension Father    Heart attack Father    Skin cancer Father    Heart attack Sister    Breast cancer Neg Hx     Prior to Admission medications   Medication Sig Start Date End Date Taking? Authorizing Provider  acetaminophen (TYLENOL) 500 MG tablet Take 500-1,000 mg by mouth every 4 (four) hours as needed for mild pain or fever.    [provider]  alum & mag hydroxide-simeth (MAALOX/MYLANTA) 200-200-20 MG/5ML suspension Take 30 mLs by mouth every 4 (four) hours as needed for indigestion or heartburn. 09/22/22   Drema Dallas, MD  ascorbic acid (VITAMIN C) 500 MG tablet Take 1 tablet (500 mg total) by mouth daily. 09/22/22   Drema Dallas, MD  cholecalciferol (VITAMIN D3) 25 MCG (1000 UNIT)  tablet Take 1,000 Units by mouth daily.    [provider]  Cyanocobalamin (B-12) 2500 MCG TABS Take 2,500 mcg by mouth daily.    [provider]  diltiazem (CARDIZEM CD) 120 MG 24 hr capsule Take 1 capsule (120 mg total) by mouth daily. 10/15/21   Marrion Coy, MD  fenofibrate 160 MG tablet Take 160 mg by mouth daily.    [provider]  Ferrous Sulfate (IRON) 325 (65 Fe) MG TABS Take 1 tablet by mouth daily.    [provider]  gabapentin (NEURONTIN) 100 MG capsule Take 1 capsule by mouth 3 (three) times daily. 02/19/22   [provider]  HYDROcodone-acetaminophen (NORCO) 10-325 MG tablet Take 1 tablet by mouth every 4 (four) hours as needed for moderate pain or severe pain. 09/22/22   Drema Dallas, MD  hydrOXYzine (ATARAX) 25 MG tablet Take 25 mg by mouth daily.    [provider]  insulin NPH-regular Human (NOVOLIN 70/30) (70-30) 100 UNIT/ML injection Inject 25 Units into the skin daily with breakfast AND 25 Units daily with supper. 09/22/22   Drema Dallas, MD  insulin regular (NOVOLIN R) 100 units/mL injection Inject 1-10 Units into the skin daily. lunchtime     [provider]  lisinopril (ZESTRIL) 5 MG tablet Take 5 mg by mouth daily.    [provider]  loratadine (CLARITIN) 10 MG tablet Take 10 mg by mouth daily.    [provider]  magnesium oxide (MAG-OX) 400 (240 Mg) MG tablet Take 400 mg by mouth 2 (two) times daily. 03/13/21   [provider]  Multiple Vitamin (MULTIVITAMIN WITH MINERALS) TABS tablet Take 1 tablet by mouth at bedtime.    [provider]  nitroGLYCERIN (NITROSTAT) 0.4 MG SL tablet Place 0.4 mg under the tongue every 5 (five) minutes as needed for chest pain. Patient not taking: Reported on 02/24/2022    [provider]  ondansetron (ZOFRAN) 4 MG tablet Take 4 mg by mouth every 8 (eight) hours as needed for vomiting or nausea. 09/19/20   [provider]  pantoprazole (PROTONIX) 40 MG tablet Take 40 mg by mouth 2 (two) times daily.    [provider]  polyethylene glycol powder (GLYCOLAX/MIRALAX) 17 GM/SCOOP powder Take 17 g by mouth 2 (two) times daily. 09/22/22   Drema Dallas, MD  propranolol ER (INDERAL LA) 60 MG 24 hr capsule Take 1 capsule (60 mg total) by mouth daily. 10/14/21 10/14/22  Marrion Coy, MD  senna (SENOKOT) 8.6 MG tablet Take 1 tablet by mouth daily.    [provider]  sertraline (ZOLOFT) 100 MG tablet Take 100 mg by mouth daily.    [provider]  spironolactone (ALDACTONE) 50 MG tablet Take 50 mg by mouth daily. 03/13/21   [provider]    Physical Exam: Vitals:   10/29/22 1126 10/29/22 1128 10/29/22 1441  BP: (!) 148/79  (!) 127/55  Pulse: 98  85  Resp:   16  Temp: 98 F (36.7 C)    TempSrc: Oral    SpO2: 99%  100%  Weight:  71.7 kg   Height:  5\' 3"  (1.6 m)    Physical Exam Vitals and nursing note reviewed.  Constitutional:      General: She is not in acute distress.    Appearance: She is normal weight.  HENT:     Head: Normocephalic and atraumatic.     Mouth/Throat:     Mouth: Mucous membranes  are dry.     Pharynx: Oropharynx is clear.  Eyes:     Conjunctiva/sclera: Conjunctivae normal.     Pupils: Pupils are equal, round, and reactive to light.  Cardiovascular:     Rate and Rhythm: Normal rate and regular rhythm.     Heart sounds: No murmur heard. Pulmonary:     Effort: Pulmonary effort is normal. No respiratory distress.     Breath sounds: Normal breath sounds. No wheezing, rhonchi or rales.  Chest:     Chest wall: Tenderness (Along the superior left chest) present.  Abdominal:     General: Bowel sounds are normal. There is no distension.     Palpations: Abdomen is soft.  Musculoskeletal:     Left shoulder: Tenderness present.     Right knee: No bony tenderness.     Left knee: No bony tenderness.     Right lower leg: No edema.     Left lower leg: No edema.  Skin:    General: Skin is warm and dry.  Neurological:     General: No focal deficit present.     Mental Status: She is alert and oriented to person, place, and time. Mental status is at baseline.  Psychiatric:        Mood and Affect: Mood normal.        Behavior: Behavior normal.    Data Reviewed: CBC with C10.8, hemoglobin 12.3, platelets of 207 CMP with sodium of 129, potassium 4.2, bicarb 22, BUN 34, creatinine 1.36, anion gap 14, AST 28, ALT 16, total bilirubin 1.7, and GFR 40 Troponin negative x 2  EKG personally reviewed.  Sinus rhythm with rate of 86.  Borderline first-degree AV block.  QT prolongation with QTc of 485.  CT CHEST ABDOMEN PELVIS W CONTRAST  Addendum Date: 10/29/2022   ADDENDUM REPORT: 10/29/2022 14:13 ADDENDUM: There is additionally a mildly displaced fracture of the left clavicular head. Findings discussed by telephone with Dr. Arnoldo Morale. Electronically Signed   By: Jearld Lesch M.D.   On: 10/29/2022 14:13   Result Date: 10/29/2022 CLINICAL DATA:  Fall EXAM: CT CHEST, ABDOMEN, AND PELVIS WITH CONTRAST TECHNIQUE: Multidetector CT imaging of the chest, abdomen and pelvis was performed  following the standard protocol during bolus administration of intravenous contrast. RADIATION DOSE REDUCTION: This exam was performed according to the departmental dose-optimization program which includes automated exposure control, adjustment of the mA and/or kV according to patient size and/or use of iterative reconstruction technique. CONTRAST:  75mL OMNIPAQUE IOHEXOL 300 MG/ML  SOLN COMPARISON:  CT chest, 09/17/2022, CT abdomen pelvis, 11/15/2019 FINDINGS: CT CHEST FINDINGS Cardiovascular: Aortic atherosclerosis. Normal heart size. Extensive three-vessel coronary artery calcifications status post median sternotomy and CABG. No pericardial effusion. Mediastinum/Nodes: No enlarged mediastinal, hilar, or axillary lymph nodes. Thyroid gland, trachea, and esophagus demonstrate no significant findings. Lungs/Pleura: Background of very fine centrilobular nodularity, most concentrated in the lung apices. Bandlike scarring of the bilateral lung bases. No pleural effusion or pneumothorax. Musculoskeletal: Superficial soft tissue stranding about the anterior left chest and shoulder (series 3, image 20). Impacted fracture of the proximal left humerus. CT ABDOMEN PELVIS FINDINGS Hepatobiliary: No focal liver abnormality is seen. Status post cholecystectomy. Mild postoperative biliary dilatation. Pancreas: Unremarkable. No pancreatic ductal dilatation or surrounding inflammatory changes. Spleen: Normal in size without significant abnormality. Adrenals/Urinary Tract: Definitively benign, calcium and macroscopic fat containing left adrenal adenoma, most likely related to prior hemorrhage or infection, no further follow-up or characterization is required (series 3, image 63). Kidneys are  normal, without renal calculi, solid lesion, or hydronephrosis. Bladder is unremarkable. Stomach/Bowel: Stomach is within normal limits. Appendix appears normal. No evidence of bowel wall thickening, distention, or inflammatory changes.  Vascular/Lymphatic: Aortic atherosclerosis. No enlarged abdominal or pelvic lymph nodes. Reproductive: Status post hysterectomy. Other: Fat containing umbilical hernia.  No ascites. Musculoskeletal: Subacute fractures through the anterior osteophytes and anterior endplate of T11 as well as anterior osteophytes of T8-T9, as seen acutely on prior examination dated 09/17/2022 (series 8, image 63). Extensive disc degenerative disease and bridging osteophytosis throughout the thoracic spine, in keeping with DISH. Wedge deformity of L3, unchanged. IMPRESSION: 1. Impacted fracture of the proximal left humerus with overlying soft tissue stranding. 2. Subacute fractures through the anterior osteophytes and anterior endplate of T11 as well as anterior osteophytes of T8-T9, as seen acutely on prior examination dated 09/17/2022. 3. No CT evidence of acute traumatic injury to the organs of the chest, abdomen, or pelvis. 4. Background of very fine centrilobular nodularity, most concentrated in the lung apices, most commonly seen in smoking-related respiratory bronchiolitis. 5. Coronary artery disease. Aortic Atherosclerosis (ICD10-I70.0). Electronically Signed: By: Jearld Lesch M.D. On: 10/29/2022 13:04   DG Knee Complete 4 Views Right  Result Date: 10/29/2022 CLINICAL DATA:  Trauma, fall, pain EXAM: RIGHT KNEE - COMPLETE 4+ VIEW COMPARISON:  09/13/2022 FINDINGS: No recent fracture or dislocation is seen. Degenerative changes are noted with bony spurs and meniscal cartilage calcifications in medial, lateral and patellofemoral compartments. There is no significant effusion in suprapatellar bursa. There is large dense calcification in the course of the patellar tendon with no significant interval change. IMPRESSION: No recent fracture or dislocation is seen in right knee. Degenerative changes are noted. Large linear calcification in patellar tendon has not changed significantly. Electronically Signed   By: Ernie Avena  M.D.   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 No recent fracture or dislocation is seen. Osteopenia is seen in bony structures. There is no significant effusion in suprapatellar bursa. Scattered arterial calcifications are seen in soft tissues. IMPRESSION: Previous left knee arthroplasty. No recent fracture or dislocation is seen. Arteriosclerosis. Electronically Signed   By: Ernie Avena M.D.   On: 10/29/2022 13:43   CT Head Wo Contrast  Result Date: 10/29/2022 CLINICAL DATA:  Fall, hit head EXAM: CT HEAD WITHOUT CONTRAST CT CERVICAL SPINE WITHOUT CONTRAST TECHNIQUE: Multidetector CT imaging of the head and cervical spine was performed following the standard protocol without intravenous contrast. Multiplanar CT image reconstructions of the cervical spine were also generated. RADIATION DOSE REDUCTION: This exam was performed according to the departmental dose-optimization program which includes automated exposure control, adjustment of the mA and/or kV according to patient size and/or use of iterative reconstruction technique. COMPARISON:  09/13/2022 FINDINGS: CT HEAD FINDINGS Brain: No evidence of acute infarction, hemorrhage, hydrocephalus, extra-axial collection or mass lesion/mass effect. Vascular: No hyperdense vessel or unexpected calcification. Skull: Normal. Negative for fracture or focal lesion. Sinuses/Orbits: No acute finding. Other: None. CT CERVICAL SPINE FINDINGS Alignment: Normal. Skull base and vertebrae: No acute fracture. No primary bone lesion or focal pathologic process. Soft tissues and spinal canal: No prevertebral fluid or swelling. No visible canal hematoma. Disc levels: Corpectomy and anterior fusion of C3 through C7. Upper chest: Negative. Other: None. IMPRESSION: 1. No acute intracranial pathology.  2. No fracture or  static subluxation of the cervical spine. 3. Corpectomy and anterior fusion of C3 through C7. Electronically Signed   By: Jearld Lesch M.D.   On: 10/29/2022 12:44   CT Cervical Spine Wo Contrast  Result Date: 10/29/2022 CLINICAL DATA:  Fall, hit head EXAM: CT HEAD WITHOUT CONTRAST CT CERVICAL SPINE WITHOUT CONTRAST TECHNIQUE: Multidetector CT imaging of the head and cervical spine was performed following the standard protocol without intravenous contrast. Multiplanar CT image reconstructions of the cervical spine were also generated. RADIATION DOSE REDUCTION: This exam was performed according to the departmental dose-optimization program which includes automated exposure control, adjustment of the mA and/or kV according to patient size and/or use of iterative reconstruction technique. COMPARISON:  09/13/2022 FINDINGS: CT HEAD FINDINGS Brain: No evidence of acute infarction, hemorrhage, hydrocephalus, extra-axial collection or mass lesion/mass effect. Vascular: No hyperdense vessel or unexpected calcification. Skull: Normal. Negative for fracture or focal lesion. Sinuses/Orbits: No acute finding. Other: None. CT CERVICAL SPINE FINDINGS Alignment: Normal. Skull base and vertebrae: No acute fracture. No primary bone lesion or focal pathologic process. Soft tissues and spinal canal: No prevertebral fluid or swelling. No visible canal hematoma. Disc levels: Corpectomy and anterior fusion of C3 through C7. Upper chest: Negative. Other: None. IMPRESSION: 1. No acute intracranial pathology. 2. No fracture or static subluxation of the cervical spine. 3. Corpectomy and anterior fusion of C3 through C7. Electronically Signed   By: Jearld Lesch M.D.   On: 10/29/2022 12:44   DG Shoulder 1V Left  Result Date: 10/29/2022 CLINICAL DATA:  Left arm pain after fall. EXAM: LEFT SHOULDER COMPARISON:  None Available. FINDINGS: Moderately displaced proximal left humeral neck fracture is noted. No dislocation is  seen. IMPRESSION: Moderately displaced proximal left humeral neck fracture. Electronically Signed   By: Lupita Raider M.D.   On: 10/29/2022 12:31   DG Humerus Left  Result Date: 10/29/2022 CLINICAL DATA:  Left arm pain after fall today. EXAM: LEFT HUMERUS - 2+ VIEW COMPARISON:  None Available. FINDINGS: Moderately displaced proximal left humeral neck fracture is noted. Remaining portion of left humerus is unremarkable. IMPRESSION: Moderately displaced proximal left humeral neck fracture. Electronically Signed   By: Lupita Raider M.D.   On: 10/29/2022 12:30    Results are pending, will review when available.  Assessment and Plan:  * Ground-level fall Patient is presenting after ground-level fall during which she denies any loss of consciousness or syncope as well.  She is unclear if she was experiencing any dizziness beforehand.  Given QT prolongation on EKG, will keep on telemetry monitoring overnight.  Of note, no valvular disorder seen on recent echo obtained in May.  - Telemetry monitoring - PT/OT  Humerus fracture CT imaging demonstrates an impacted fracture of the left proximal humerus.  - Orthopedic surgery consulted; appreciate their recommendations - Sling applied - Norco and Dilaudid for pain control - PT/OT  Hyponatremia Hyponatremia with sodium of 129 in the setting of chronic hyponatremia.  Low suspicion this is contributing to current presentation.   - S/p 1 L of normal saline in the ED - Repeat BMP in the a.m.  Leukocytosis WBC elevated at 10.8, likely reactive in setting of fall with multiple fractures.  No suspicion for infection at this time.  Chronic kidney disease, stage 3b (HCC) Renal function currently at baseline.  Will continue to monitor while admitted.  -BMP in the a.m.  Type 2 diabetes mellitus with diabetic chronic kidney disease (HCC) - Hold home insulin - SSI, sensitive  OSA on CPAP - CPAP at bedtime  Heart failure, unspecified  (HCC) Patient appears euvolemic at this time with no peripheral or pulmonary edema.  - Resume home diuretics tomorrow  CAD (coronary artery disease), autologous vein bypass graft Patient denies any chest pain at this time and troponin is negative.  - Continue home medications  Essential hypertension - Continue home antihypertensives  Advance Care Planning:   Code Status: Full Code verified by patient  Consults: Orthopedic surgery  Family Communication: No family at bedside  Severity of Illness: The appropriate patient status for this patient is OBSERVATION. Observation status is judged to be reasonable and necessary in order to provide the required intensity of service to ensure the patient's safety. The patient's presenting symptoms, physical exam findings, and initial radiographic and laboratory data in the context of their medical condition is felt to place them at decreased risk for further clinical deterioration. Furthermore, it is anticipated that the patient will be medically stable for discharge from the hospital within 2 midnights of admission.   Author: Verdene Lennert, MD 10/29/2022 4:04 PM  For on call review [email or web address].

## 2022-10-29 NOTE — Assessment & Plan Note (Addendum)
CT imaging demonstrates an impacted fracture of the left proximal humerus.  Orthopedic surgery recommending immobilization as treatment.  Continue arm sling.  Patient received IV and oral pain medications.

## 2022-10-29 NOTE — Assessment & Plan Note (Addendum)
Hold spironolactone and lisinopril with slight elevation in creatinine.

## 2022-10-29 NOTE — Assessment & Plan Note (Deleted)
Patient appears euvolemic at this time with no peripheral or pulmonary edema.  - Resume home diuretics tomorrow

## 2022-10-29 NOTE — ED Notes (Addendum)
Patient c/o right arm pain 8/10. Patient's bp prior to administering medications is 102/57. This nurse to reposition patient at this time.

## 2022-10-29 NOTE — Consult Note (Signed)
ORTHOPAEDIC CONSULTATION  REQUESTING PHYSICIAN: Verdene Lennert, MD  Chief Complaint: Left proximal humerus fracture  HPI: Rhonda Tyler is a 82 y.o. right hand dominant female who complains of left shoulder pain after a fall at home today.  She states that she turned and her knees gave out causing her to fall.  The patient was found to have a moderately displace left proximal humerus fracture and on CT scan a mildly displaced proximal clavicle fracture.  Orthopaedics is consulted for management of the fractures.   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   Colon polyp    Coronary artery disease    Cutaneous sarcoidosis    Diabetes mellitus without complication (HCC)    Pt takes insulin   Diabetic neuropathy (HCC)    GERD (gastroesophageal reflux disease)    Headache    migaines in the past   Hemorrhoid    History of diabetic neuropathy    Hypercalcemia    Hyperlipemia    Hypertension    IBS (irritable bowel syndrome)    Iron deficiency anemia    Myocardial infarction (HCC)    Pneumonia    Sleep apnea    NO CPAP   Past Surgical History:  Procedure Laterality Date   ABDOMINAL HYSTERECTOMY     arthroscopic rotator cuff repair     BACK SURGERY     CARDIAC CATHETERIZATION     CATARACT EXTRACTION W/PHACO Right 01/26/2022   Procedure: CATARACT EXTRACTION PHACO AND INTRAOCULAR LENS PLACEMENT (IOC) RIGHT DIABETIC;  Surgeon: Nevada Crane, MD;  Location: [facility];  Service: Ophthalmology;  Laterality: Right;  2.90 0:29.3   CATARACT EXTRACTION W/PHACO Left 02/09/2022   Procedure: CATARACT EXTRACTION PHACO AND INTRAOCULAR LENS PLACEMENT (IOC) LEFT DIABETIC 3.54 00:32.3;  Surgeon: Nevada Crane, MD;  Location: [facility];  Service: Ophthalmology;   Laterality: Left;  Diabetic   CHOLECYSTECTOMY     COLONOSCOPY     COLONOSCOPY WITH PROPOFOL N/A 11/29/2017   Procedure: COLONOSCOPY WITH PROPOFOL;  Surgeon: Christena Deem, MD;  Location: [facility];  Service: Endoscopy;  Laterality: N/A;   CORONARY ANGIOPLASTY     PTCA and stent of RCA   CORONARY ARTERY BYPASS GRAFT     ESOPHAGOGASTRODUODENOSCOPY     ESOPHAGOGASTRODUODENOSCOPY (EGD) WITH PROPOFOL N/A 11/29/2017   Procedure: ESOPHAGOGASTRODUODENOSCOPY (EGD) WITH PROPOFOL;  Surgeon: Christena Deem, MD;  Location: [facility] ENDOSCOPY;  Service: Endoscopy;  Laterality: N/A;   ESOPHAGOGASTRODUODENOSCOPY (EGD) WITH PROPOFOL N/A 03/07/2018   Procedure: ESOPHAGOGASTRODUODENOSCOPY (EGD) WITH PROPOFOL;  Surgeon: Christena Deem, MD;  Location: [facility] ENDOSCOPY;  Service: Endoscopy;  Laterality: N/A;   ESOPHAGOGASTRODUODENOSCOPY (EGD) WITH PROPOFOL N/A 08/16/2019   Procedure: ESOPHAGOGASTRODUODENOSCOPY (EGD) WITH PROPOFOL;  Surgeon: Toledo, Boykin Nearing, MD;  Location: [facility] ENDOSCOPY;  Service: Gastroenterology;  Laterality: N/A;   EXCISION OF ADNEXAL MASS     JOINT REPLACEMENT  04/2021   left total knee replacement   OOPHORECTOMY     TOTAL KNEE REVISION Left 05/07/2021   Procedure: TOTAL KNEE REVISION;  Surgeon: Donato Heinz, MD;  Location: [facility] ORS;  Service: Orthopedics;  Laterality: Left;   vesicular vaginal fistula     Social History   Socioeconomic History   Marital  status: Widowed    Spouse name: Not on file   Number of children: Not on file   Years of education: Not on file   Highest education level: Not on file  Occupational History   Occupation: retired  Tobacco Use   Smoking status: Former    Types: Cigarettes    Quit date: 12/01/1970    Years since quitting: 51.9   Smokeless tobacco: Never  Vaping Use   Vaping Use: Never used  Substance and Sexual Activity   Alcohol use: Not Currently   Drug use: Never   Sexual activity: Not on file  Other Topics Concern   Not  on file  Social History Narrative   Not on file   Social Determinants of Health   Financial Resource Strain: Not on file  Food Insecurity: No Food Insecurity (09/13/2022)   Hunger Vital Sign    Worried About Running Out of Food in the Last Year: Never true    Ran Out of Food in the Last Year: Never true  Transportation Needs: No Transportation Needs (09/13/2022)   PRAPARE - Administrator, Civil Service (Medical): No    Lack of Transportation (Non-Medical): No  Physical Activity: Not on file  Stress: Not on file  Social Connections: Not on file   Family History  Problem Relation Age of Onset   Hypertension Mother    Heart attack Mother    Hypertension Father    Heart attack Father    Skin cancer Father    Heart attack Sister    Breast cancer Neg Hx    Allergies  Allergen Reactions   Levaquin [Levofloxacin In D5w] Shortness Of Breath   Penicillins Hives   Codeine Other (See Comments)    Hallucinate   Lipitor [Atorvastatin Calcium] Other (See Comments)    Muscle pain   Lopressor [Metoprolol Tartrate] Other (See Comments)    Heart races   Potassium-Containing Compounds Other (See Comments)     Facial flushing   Procardia [Nifedipine] Other (See Comments)    Heart races   Sucralfate Nausea Only   Tramadol Other (See Comments)    Confusion   Allegra [Fexofenadine] Rash   Naltrexone Other (See Comments)    Severe headache, nausea, "body flashes".    Sulfa Antibiotics Rash   Prior to Admission medications   Medication Sig Start Date End Date Taking? Authorizing Provider  acetaminophen (TYLENOL) 500 MG tablet Take 500-1,000 mg by mouth every 4 (four) hours as needed for mild pain or fever.   Yes [provider]  alum & mag hydroxide-simeth (MAALOX/MYLANTA) 200-200-20 MG/5ML suspension Take 30 mLs by mouth every 4 (four) hours as needed for indigestion or heartburn. 09/22/22  Yes Drema Dallas, MD  ascorbic acid (VITAMIN C) 500 MG tablet Take 1 tablet  (500 mg total) by mouth daily. 09/22/22  Yes Drema Dallas, MD  cholecalciferol (VITAMIN D3) 25 MCG (1000 UNIT) tablet Take 1,000 Units by mouth daily.   Yes [provider]  Cyanocobalamin (B-12) 2500 MCG TABS Take 2,500 mcg by mouth daily.   Yes [provider]  diltiazem (CARDIZEM CD) 120 MG 24 hr capsule Take 1 capsule (120 mg total) by mouth daily. 10/15/21  Yes Marrion Coy, MD  fenofibrate 160 MG tablet Take 160 mg by mouth daily.   Yes [provider]  Ferrous Sulfate (IRON) 325 (65 Fe) MG TABS Take 1 tablet by mouth daily.   Yes [provider]  gabapentin (NEURONTIN) 100 MG  capsule Take 1 capsule by mouth 3 (three) times daily. 02/19/22  Yes [provider]  HYDROcodone-acetaminophen (NORCO) 10-325 MG tablet Take 1 tablet by mouth every 4 (four) hours as needed for moderate pain or severe pain. 09/22/22  Yes Drema Dallas, MD  hydrOXYzine (ATARAX) 25 MG tablet Take 25 mg by mouth daily.   Yes [provider]  insulin NPH-regular Human (NOVOLIN 70/30) (70-30) 100 UNIT/ML injection Inject 25 Units into the skin daily with breakfast AND 25 Units daily with supper. 09/22/22  Yes Drema Dallas, MD  insulin regular (NOVOLIN R) 100 units/mL injection Inject 1-10 Units into the skin daily. lunchtime   Yes [provider]  lisinopril (ZESTRIL) 5 MG tablet Take 5 mg by mouth daily.   Yes [provider]  loratadine (CLARITIN) 10 MG tablet Take 10 mg by mouth daily.   Yes [provider]  magnesium oxide (MAG-OX) 400 (240 Mg) MG tablet Take 400 mg by mouth 2 (two) times daily. 03/13/21  Yes [provider]  Multiple Vitamin (MULTIVITAMIN WITH MINERALS) TABS tablet Take 1 tablet by mouth at bedtime.   Yes [provider]  ondansetron (ZOFRAN) 4 MG tablet Take 4 mg by mouth every 8 (eight) hours as needed for vomiting or nausea. 09/19/20  Yes [provider]  pantoprazole (PROTONIX) 40 MG tablet  Take 40 mg by mouth 2 (two) times daily.   Yes [provider]  polyethylene glycol powder (GLYCOLAX/MIRALAX) 17 GM/SCOOP powder Take 17 g by mouth 2 (two) times daily. 09/22/22  Yes Drema Dallas, MD  propranolol ER (INDERAL LA) 60 MG 24 hr capsule Take 1 capsule (60 mg total) by mouth daily. 10/14/21 10/29/22 Yes Marrion Coy, MD  senna (SENOKOT) 8.6 MG tablet Take 1 tablet by mouth daily.   Yes [provider]  sertraline (ZOLOFT) 100 MG tablet Take 100 mg by mouth daily.   Yes [provider]  spironolactone (ALDACTONE) 50 MG tablet Take 50 mg by mouth daily. 03/13/21  Yes [provider]  nitroGLYCERIN (NITROSTAT) 0.4 MG SL tablet Place 0.4 mg under the tongue every 5 (five) minutes as needed for chest pain. Patient not taking: Reported on 02/24/2022    [provider]   CT CHEST ABDOMEN PELVIS W CONTRAST  Addendum Date: 10/29/2022   ADDENDUM REPORT: 10/29/2022 14:13 ADDENDUM: There is additionally a mildly displaced fracture of the left clavicular head. Findings discussed by telephone with Dr. Arnoldo Morale. Electronically Signed   By: Jearld Lesch M.D.   On: 10/29/2022 14:13   Result Date: 10/29/2022 CLINICAL DATA:  Fall EXAM: CT CHEST, ABDOMEN, AND PELVIS WITH CONTRAST TECHNIQUE: Multidetector CT imaging of the chest, abdomen and pelvis was performed following the standard protocol during bolus administration of intravenous contrast. RADIATION DOSE REDUCTION: This exam was performed according to the departmental dose-optimization program which includes automated exposure control, adjustment of the mA and/or kV according to patient size and/or use of iterative reconstruction technique. CONTRAST:  75mL OMNIPAQUE IOHEXOL 300 MG/ML  SOLN COMPARISON:  CT chest, 09/17/2022, CT abdomen pelvis, 11/15/2019 FINDINGS: CT CHEST FINDINGS Cardiovascular: Aortic atherosclerosis. Normal heart size. Extensive three-vessel coronary artery calcifications status post median  sternotomy and CABG. No pericardial effusion. Mediastinum/Nodes: No enlarged mediastinal, hilar, or axillary lymph nodes. Thyroid gland, trachea, and esophagus demonstrate no significant findings. Lungs/Pleura: Background of very fine centrilobular nodularity, most concentrated in the lung apices. Bandlike scarring of the bilateral lung bases. No pleural effusion or pneumothorax. Musculoskeletal: Superficial soft tissue  stranding about the anterior left chest and shoulder (series 3, image 20). Impacted fracture of the proximal left humerus. CT ABDOMEN PELVIS FINDINGS Hepatobiliary: No focal liver abnormality is seen. Status post cholecystectomy. Mild postoperative biliary dilatation. Pancreas: Unremarkable. No pancreatic ductal dilatation or surrounding inflammatory changes. Spleen: Normal in size without significant abnormality. Adrenals/Urinary Tract: Definitively benign, calcium and macroscopic fat containing left adrenal adenoma, most likely related to prior hemorrhage or infection, no further follow-up or characterization is required (series 3, image 63). Kidneys are normal, without renal calculi, solid lesion, or hydronephrosis. Bladder is unremarkable. Stomach/Bowel: Stomach is within normal limits. Appendix appears normal. No evidence of bowel wall thickening, distention, or inflammatory changes. Vascular/Lymphatic: Aortic atherosclerosis. No enlarged abdominal or pelvic lymph nodes. Reproductive: Status post hysterectomy. Other: Fat containing umbilical hernia.  No ascites. Musculoskeletal: Subacute fractures through the anterior osteophytes and anterior endplate of T11 as well as anterior osteophytes of T8-T9, as seen acutely on prior examination dated 09/17/2022 (series 8, image 63). Extensive disc degenerative disease and bridging osteophytosis throughout the thoracic spine, in keeping with DISH. Wedge deformity of L3, unchanged. IMPRESSION: 1. Impacted fracture of the proximal left humerus with  overlying soft tissue stranding. 2. Subacute fractures through the anterior osteophytes and anterior endplate of T11 as well as anterior osteophytes of T8-T9, as seen acutely on prior examination dated 09/17/2022. 3. No CT evidence of acute traumatic injury to the organs of the chest, abdomen, or pelvis. 4. Background of very fine centrilobular nodularity, most concentrated in the lung apices, most commonly seen in smoking-related respiratory bronchiolitis. 5. Coronary artery disease. Aortic Atherosclerosis (ICD10-I70.0). Electronically Signed: By: Jearld Lesch M.D. On: 10/29/2022 13:04   DG Knee Complete 4 Views Right  Result Date: 10/29/2022 CLINICAL DATA:  Trauma, fall, pain EXAM: RIGHT KNEE - COMPLETE 4+ VIEW COMPARISON:  09/13/2022 FINDINGS: No recent fracture or dislocation is seen. Degenerative changes are noted with bony spurs and meniscal cartilage calcifications in medial, lateral and patellofemoral compartments. There is no significant effusion in suprapatellar bursa. There is large dense calcification in the course of the patellar tendon with no significant interval change. IMPRESSION: No recent fracture or dislocation is seen in right knee. Degenerative changes are noted. Large linear calcification in patellar tendon has not changed significantly. Electronically Signed   By: Ernie Avena M.D.   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 No recent fracture or dislocation is seen. Osteopenia is seen in bony structures. There is no significant effusion in suprapatellar bursa. Scattered arterial calcifications are seen in soft tissues. IMPRESSION: Previous left knee arthroplasty. No recent fracture or dislocation is seen. Arteriosclerosis. Electronically Signed   By: Ernie Avena M.D.   On: 10/29/2022 13:43    CT Head Wo Contrast  Result Date: 10/29/2022 CLINICAL DATA:  Fall, hit head EXAM: CT HEAD WITHOUT CONTRAST CT CERVICAL SPINE WITHOUT CONTRAST TECHNIQUE: Multidetector CT imaging of the head and cervical spine was performed following the standard protocol without intravenous contrast. Multiplanar CT image reconstructions of the cervical spine were also generated. RADIATION DOSE REDUCTION: This exam was performed according to the departmental dose-optimization program which includes automated exposure control, adjustment of the mA and/or kV according to patient size and/or use of iterative reconstruction technique. COMPARISON:  09/13/2022 FINDINGS: CT HEAD FINDINGS Brain: No evidence of acute infarction, hemorrhage, hydrocephalus, extra-axial  collection or mass lesion/mass effect. Vascular: No hyperdense vessel or unexpected calcification. Skull: Normal. Negative for fracture or focal lesion. Sinuses/Orbits: No acute finding. Other: None. CT CERVICAL SPINE FINDINGS Alignment: Normal. Skull base and vertebrae: No acute fracture. No primary bone lesion or focal pathologic process. Soft tissues and spinal canal: No prevertebral fluid or swelling. No visible canal hematoma. Disc levels: Corpectomy and anterior fusion of C3 through C7. Upper chest: Negative. Other: None. IMPRESSION: 1. No acute intracranial pathology. 2. No fracture or static subluxation of the cervical spine. 3. Corpectomy and anterior fusion of C3 through C7. Electronically Signed   By: Jearld Lesch M.D.   On: 10/29/2022 12:44   CT Cervical Spine Wo Contrast  Result Date: 10/29/2022 CLINICAL DATA:  Fall, hit head EXAM: CT HEAD WITHOUT CONTRAST CT CERVICAL SPINE WITHOUT CONTRAST TECHNIQUE: Multidetector CT imaging of the head and cervical spine was performed following the standard protocol without intravenous contrast. Multiplanar CT image reconstructions of the cervical spine were also generated. RADIATION DOSE REDUCTION: This exam was  performed according to the departmental dose-optimization program which includes automated exposure control, adjustment of the mA and/or kV according to patient size and/or use of iterative reconstruction technique. COMPARISON:  09/13/2022 FINDINGS: CT HEAD FINDINGS Brain: No evidence of acute infarction, hemorrhage, hydrocephalus, extra-axial collection or mass lesion/mass effect. Vascular: No hyperdense vessel or unexpected calcification. Skull: Normal. Negative for fracture or focal lesion. Sinuses/Orbits: No acute finding. Other: None. CT CERVICAL SPINE FINDINGS Alignment: Normal. Skull base and vertebrae: No acute fracture. No primary bone lesion or focal pathologic process. Soft tissues and spinal canal: No prevertebral fluid or swelling. No visible canal hematoma. Disc levels: Corpectomy and anterior fusion of C3 through C7. Upper chest: Negative. Other: None. IMPRESSION: 1. No acute intracranial pathology. 2. No fracture or static subluxation of the cervical spine. 3. Corpectomy and anterior fusion of C3 through C7. Electronically Signed   By: Jearld Lesch M.D.   On: 10/29/2022 12:44   DG Shoulder 1V Left  Result Date: 10/29/2022 CLINICAL DATA:  Left arm pain after fall. EXAM: LEFT SHOULDER COMPARISON:  None Available. FINDINGS: Moderately displaced proximal left humeral neck fracture is noted. No dislocation is seen. IMPRESSION: Moderately displaced proximal left humeral neck fracture. Electronically Signed   By: Lupita Raider M.D.   On: 10/29/2022 12:31   DG Humerus Left  Result Date: 10/29/2022 CLINICAL DATA:  Left arm pain after fall today. EXAM: LEFT HUMERUS - 2+ VIEW COMPARISON:  None Available. FINDINGS: Moderately displaced proximal left humeral neck fracture is noted. Remaining portion of left humerus is unremarkable. IMPRESSION: Moderately displaced proximal left humeral neck fracture. Electronically Signed   By: Lupita Raider M.D.   On: 10/29/2022 12:30    Positive ROS: All other  systems have been reviewed and were otherwise negative with the exception of those mentioned in the HPI and as above.  Physical Exam: General: Alert, no acute distress  MUSCULOSKELETAL: Left shoulder:  Patient is wearing a sling on the left upper extremity.  Skin is intact over the left shoulder and clavicle.  There is focal swelling over the proximal clavicle with mild tenderness to palpation without crepitus.  The left shoulder has swelling and ecchymosis but the skin is intact and are and forearem compartments are soft and compressible.  The patient can flex and extend her fingers on the left hand and left wrist with a palpable radial pulse.    Assessment: Left proximal humerus fracture Left proximal clavicle fracture  Plan: I have personally reviewed the patient's left shoulder xrays and chest CT.  Both her proximal humerus and proximal clavicle fractures have mild to moderate displacement. Both can be treated non-operatively at this point.  Continue sling and swath.  Patient should be non-weightbearing on the left upper extremity and avoid active forward elevation and abduction.  She can follow-up with orthopaedics in 10-14 days for re-evaluation and xray in the office.  She understood and agreed with this plan.  The patient may be discharged from an orthopaedic standpoint once she is cleared medically.   Juanell Fairly, MD    10/29/2022 8:22 PM

## 2022-10-29 NOTE — ED Triage Notes (Signed)
Pt states she suffered a GLF at approximately 0800 this morning. Pt states she was turning around and lost her balance. Pt states she did hit her head but denies LOC. Pt c/o L arm and shoulder pain and pain in bilateral knees at this time. C collar placed by EMS.

## 2022-10-30 DIAGNOSIS — S42202G Unspecified fracture of upper end of left humerus, subsequent encounter for fracture with delayed healing: Secondary | ICD-10-CM

## 2022-10-30 DIAGNOSIS — I5032 Chronic diastolic (congestive) heart failure: Secondary | ICD-10-CM | POA: Insufficient documentation

## 2022-10-30 DIAGNOSIS — D72829 Elevated white blood cell count, unspecified: Secondary | ICD-10-CM | POA: Diagnosis not present

## 2022-10-30 DIAGNOSIS — E871 Hypo-osmolality and hyponatremia: Secondary | ICD-10-CM | POA: Diagnosis not present

## 2022-10-30 DIAGNOSIS — W1830XA Fall on same level, unspecified, initial encounter: Secondary | ICD-10-CM | POA: Diagnosis not present

## 2022-10-30 LAB — CBC
HCT: 31.5 % — ABNORMAL LOW (ref 36.0–46.0)
Hemoglobin: 10 g/dL — ABNORMAL LOW (ref 12.0–15.0)
MCH: 27.7 pg (ref 26.0–34.0)
MCHC: 31.7 g/dL (ref 30.0–36.0)
MCV: 87.3 fL (ref 80.0–100.0)
Platelets: 192 10*3/uL (ref 150–400)
RBC: 3.61 MIL/uL — ABNORMAL LOW (ref 3.87–5.11)
RDW: 14.6 % (ref 11.5–15.5)
WBC: 11.9 10*3/uL — ABNORMAL HIGH (ref 4.0–10.5)
nRBC: 0 % (ref 0.0–0.2)

## 2022-10-30 LAB — BASIC METABOLIC PANEL
Anion gap: 12 (ref 5–15)
BUN: 37 mg/dL — ABNORMAL HIGH (ref 8–23)
CO2: 20 mmol/L — ABNORMAL LOW (ref 22–32)
Calcium: 8.6 mg/dL — ABNORMAL LOW (ref 8.9–10.3)
Chloride: 99 mmol/L (ref 98–111)
Creatinine, Ser: 1.65 mg/dL — ABNORMAL HIGH (ref 0.44–1.00)
GFR, Estimated: 31 mL/min — ABNORMAL LOW (ref >60)
Glucose, Bld: 123 mg/dL — ABNORMAL HIGH (ref 70–99)
Potassium: 4.1 mmol/L (ref 3.5–5.1)
Sodium: 131 mmol/L — ABNORMAL LOW (ref 135–145)

## 2022-10-30 LAB — URINALYSIS, W/ REFLEX TO CULTURE (INFECTION SUSPECTED)
Bilirubin Urine: NEGATIVE
Glucose, UA: NEGATIVE mg/dL
Hgb urine dipstick: NEGATIVE
Ketones, ur: NEGATIVE mg/dL
Nitrite: NEGATIVE
Protein, ur: NEGATIVE mg/dL
Specific Gravity, Urine: 1.03 (ref 1.005–1.030)
pH: 5 (ref 5.0–8.0)

## 2022-10-30 LAB — GLUCOSE, CAPILLARY
Glucose-Capillary: 106 mg/dL — ABNORMAL HIGH (ref 70–99)
Glucose-Capillary: 223 mg/dL — ABNORMAL HIGH (ref 70–99)
Glucose-Capillary: 285 mg/dL — ABNORMAL HIGH (ref 70–99)

## 2022-10-30 MED ORDER — SODIUM CHLORIDE 0.9 % IV SOLN: INTRAVENOUS | Status: DC

## 2022-10-30 NOTE — Progress Notes (Signed)
Subjective:  Patient seen this afternoon in her hospital room.  She has proximal left clavicle and left proximal humerus fractures with mild to moderate displacement.  Patient was sleeping comfortably when I entered the room.  Patient complains of mild to moderate left shoulder pain.  She states that she is having spasms in the left shoulder.  She denies any numbness or tingling.  She is wearing a sling and swath on the left arm.  Objective:   VITALS:   Vitals:   10/30/22 0121 10/30/22 0336 10/30/22 0748 10/30/22 1512  BP: (!) 120/45 (!) 112/45 (!) 107/47 (!) 110/44  Pulse: 83 81 81 76  Resp:  16 15 19   Temp:  97.8 F (36.6 C) 98.2 F (36.8 C) 98.5 F (36.9 C)  TempSrc:  Oral    SpO2:  97% 93% 99%  Weight:      Height:        PHYSICAL EXAM: Left upper extremity: Skin is intact.  Patient has swelling over the [facility] joint and proximal humerus with ecchymosis over the proximal humerus.  Arm and forearm compartments are soft compressible.  She can flex and extend all 5 digits of the left hand.  Her fingers well-perfused and she has a palpable radial pulse.  Skin overlying the clavicle is intact with mild focal swelling but no ecchymosis.   LABS  Results for orders placed or performed during the hospital encounter of 10/29/22 (from the past 24 hour(s))  CBG monitoring, ED     Status: Abnormal   Collection Time: 10/29/22  6:45 PM  Result Value Ref Range   Glucose-Capillary 101 (H) 70 - 99 mg/dL   Comment 1 Notify RN    Comment 2 Document in Chart   Glucose, capillary     Status: None   Collection Time: 10/29/22 10:33 PM  Result Value Ref Range   Glucose-Capillary 91 70 - 99 mg/dL  CBC     Status: Abnormal   Collection Time: 10/30/22  3:52 AM  Result Value Ref Range   WBC 11.9 (H) 4.0 - 10.5 K/uL   RBC 3.61 (L) 3.87 - 5.11 MIL/uL   Hemoglobin 10.0 (L) 12.0 - 15.0 g/dL   HCT 78.2 (L) 95.6 - 21.3 %   MCV 87.3 80.0 - 100.0 fL   MCH 27.7 26.0 - 34.0 pg   MCHC 31.7 30.0 - 36.0  g/dL   RDW 08.6 57.8 - 46.9 %   Platelets 192 150 - 400 K/uL   nRBC 0.0 0.0 - 0.2 %  Basic metabolic panel     Status: Abnormal   Collection Time: 10/30/22  3:52 AM  Result Value Ref Range   Sodium 131 (L) 135 - 145 mmol/L   Potassium 4.1 3.5 - 5.1 mmol/L   Chloride 99 98 - 111 mmol/L   CO2 20 (L) 22 - 32 mmol/L   Glucose, Bld 123 (H) 70 - 99 mg/dL   BUN 37 (H) 8 - 23 mg/dL   Creatinine, Ser 6.29 (H) 0.44 - 1.00 mg/dL   Calcium 8.6 (L) 8.9 - 10.3 mg/dL   GFR, Estimated 31 (L) >60 mL/min   Anion gap 12 5 - 15  Urinalysis, w/ Reflex to Culture (Infection Suspected) -Urine, Clean Catch     Status: Abnormal   Collection Time: 10/30/22  7:26 AM  Result Value Ref Range   Specimen Source URINE, CLEAN CATCH    Color, Urine YELLOW (A) YELLOW   APPearance CLEAR (A) CLEAR   Specific Gravity, Urine  1.030 1.005 - 1.030   pH 5.0 5.0 - 8.0   Glucose, UA NEGATIVE NEGATIVE mg/dL   Hgb urine dipstick NEGATIVE NEGATIVE   Bilirubin Urine NEGATIVE NEGATIVE   Ketones, ur NEGATIVE NEGATIVE mg/dL   Protein, ur NEGATIVE NEGATIVE mg/dL   Nitrite NEGATIVE NEGATIVE   Leukocytes,Ua TRACE (A) NEGATIVE   RBC / HPF 0-5 0 - 5 RBC/hpf   WBC, UA 0-5 0 - 5 WBC/hpf   Bacteria, UA RARE (A) NONE SEEN   Squamous Epithelial / HPF 0-5 0 - 5 /HPF   Mucus PRESENT   Glucose, capillary     Status: Abnormal   Collection Time: 10/30/22  7:45 AM  Result Value Ref Range   Glucose-Capillary 106 (H) 70 - 99 mg/dL  Glucose, capillary     Status: Abnormal   Collection Time: 10/30/22 11:36 AM  Result Value Ref Range   Glucose-Capillary 223 (H) 70 - 99 mg/dL   Comment 1 Notify RN    Comment 2 Document in Chart     CT CHEST ABDOMEN PELVIS W CONTRAST  Addendum Date: 10/29/2022   ADDENDUM REPORT: 10/29/2022 14:13 ADDENDUM: There is additionally a mildly displaced fracture of the left clavicular head. Findings discussed by telephone with Dr. Arnoldo Morale. Electronically Signed   By: Jearld Lesch M.D.   On: 10/29/2022 14:13    Result Date: 10/29/2022 CLINICAL DATA:  Fall EXAM: CT CHEST, ABDOMEN, AND PELVIS WITH CONTRAST TECHNIQUE: Multidetector CT imaging of the chest, abdomen and pelvis was performed following the standard protocol during bolus administration of intravenous contrast. RADIATION DOSE REDUCTION: This exam was performed according to the departmental dose-optimization program which includes automated exposure control, adjustment of the mA and/or kV according to patient size and/or use of iterative reconstruction technique. CONTRAST:  75mL OMNIPAQUE IOHEXOL 300 MG/ML  SOLN COMPARISON:  CT chest, 09/17/2022, CT abdomen pelvis, 11/15/2019 FINDINGS: CT CHEST FINDINGS Cardiovascular: Aortic atherosclerosis. Normal heart size. Extensive three-vessel coronary artery calcifications status post median sternotomy and CABG. No pericardial effusion. Mediastinum/Nodes: No enlarged mediastinal, hilar, or axillary lymph nodes. Thyroid gland, trachea, and esophagus demonstrate no significant findings. Lungs/Pleura: Background of very fine centrilobular nodularity, most concentrated in the lung apices. Bandlike scarring of the bilateral lung bases. No pleural effusion or pneumothorax. Musculoskeletal: Superficial soft tissue stranding about the anterior left chest and shoulder (series 3, image 20). Impacted fracture of the proximal left humerus. CT ABDOMEN PELVIS FINDINGS Hepatobiliary: No focal liver abnormality is seen. Status post cholecystectomy. Mild postoperative biliary dilatation. Pancreas: Unremarkable. No pancreatic ductal dilatation or surrounding inflammatory changes. Spleen: Normal in size without significant abnormality. Adrenals/Urinary Tract: Definitively benign, calcium and macroscopic fat containing left adrenal adenoma, most likely related to prior hemorrhage or infection, no further follow-up or characterization is required (series 3, image 63). Kidneys are normal, without renal calculi, solid lesion, or  hydronephrosis. Bladder is unremarkable. Stomach/Bowel: Stomach is within normal limits. Appendix appears normal. No evidence of bowel wall thickening, distention, or inflammatory changes. Vascular/Lymphatic: Aortic atherosclerosis. No enlarged abdominal or pelvic lymph nodes. Reproductive: Status post hysterectomy. Other: Fat containing umbilical hernia.  No ascites. Musculoskeletal: Subacute fractures through the anterior osteophytes and anterior endplate of T11 as well as anterior osteophytes of T8-T9, as seen acutely on prior examination dated 09/17/2022 (series 8, image 63). Extensive disc degenerative disease and bridging osteophytosis throughout the thoracic spine, in keeping with DISH. Wedge deformity of L3, unchanged. IMPRESSION: 1. Impacted fracture of the proximal left humerus with overlying soft tissue stranding. 2. Subacute fractures through  the anterior osteophytes and anterior endplate of T11 as well as anterior osteophytes of T8-T9, as seen acutely on prior examination dated 09/17/2022. 3. No CT evidence of acute traumatic injury to the organs of the chest, abdomen, or pelvis. 4. Background of very fine centrilobular nodularity, most concentrated in the lung apices, most commonly seen in smoking-related respiratory bronchiolitis. 5. Coronary artery disease. Aortic Atherosclerosis (ICD10-I70.0). Electronically Signed: By: Jearld Lesch M.D. On: 10/29/2022 13:04   DG Knee Complete 4 Views Right  Result Date: 10/29/2022 CLINICAL DATA:  Trauma, fall, pain EXAM: RIGHT KNEE - COMPLETE 4+ VIEW COMPARISON:  09/13/2022 FINDINGS: No recent fracture or dislocation is seen. Degenerative changes are noted with bony spurs and meniscal cartilage calcifications in medial, lateral and patellofemoral compartments. There is no significant effusion in suprapatellar bursa. There is large dense calcification in the course of the patellar tendon with no significant interval change. IMPRESSION: No recent fracture or  dislocation is seen in right knee. Degenerative changes are noted. Large linear calcification in patellar tendon has not changed significantly. Electronically Signed   By: Ernie Avena M.D.   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 No recent fracture or dislocation is seen. Osteopenia is seen in bony structures. There is no significant effusion in suprapatellar bursa. Scattered arterial calcifications are seen in soft tissues. IMPRESSION: Previous left knee arthroplasty. No recent fracture or dislocation is seen. Arteriosclerosis. Electronically Signed   By: Ernie Avena M.D.   On: 10/29/2022 13:43   CT Head Wo Contrast  Result Date: 10/29/2022 CLINICAL DATA:  Fall, hit head EXAM: CT HEAD WITHOUT CONTRAST CT CERVICAL SPINE WITHOUT CONTRAST TECHNIQUE: Multidetector CT imaging of the head and cervical spine was performed following the standard protocol without intravenous contrast. Multiplanar CT image reconstructions of the cervical spine were also generated. RADIATION DOSE REDUCTION: This exam was performed according to the departmental dose-optimization program which includes automated exposure control, adjustment of the mA and/or kV according to patient size and/or use of iterative reconstruction technique. COMPARISON:  09/13/2022 FINDINGS: CT HEAD FINDINGS Brain: No evidence of acute infarction, hemorrhage, hydrocephalus, extra-axial collection or mass lesion/mass effect. Vascular: No hyperdense vessel or unexpected calcification. Skull: Normal. Negative for fracture or focal lesion. Sinuses/Orbits: No acute finding. Other: None. CT CERVICAL SPINE FINDINGS Alignment: Normal. Skull base and vertebrae: No acute fracture. No primary bone lesion or focal pathologic process. Soft tissues and spinal canal: No prevertebral  fluid or swelling. No visible canal hematoma. Disc levels: Corpectomy and anterior fusion of C3 through C7. Upper chest: Negative. Other: None. IMPRESSION: 1. No acute intracranial pathology. 2. No fracture or static subluxation of the cervical spine. 3. Corpectomy and anterior fusion of C3 through C7. Electronically Signed   By: Jearld Lesch M.D.   On: 10/29/2022 12:44   CT Cervical Spine Wo Contrast  Result Date: 10/29/2022 CLINICAL DATA:  Fall, hit head EXAM: CT HEAD WITHOUT CONTRAST CT CERVICAL SPINE WITHOUT CONTRAST TECHNIQUE: Multidetector CT imaging of the head and cervical spine was performed following the standard protocol without intravenous contrast. Multiplanar CT image reconstructions of the cervical spine were also generated. RADIATION DOSE REDUCTION: This exam was performed according to the departmental dose-optimization program which includes automated exposure control, adjustment of the mA and/or kV according to patient size and/or use of iterative reconstruction technique. COMPARISON:  09/13/2022 FINDINGS: CT HEAD FINDINGS Brain: No evidence of acute infarction, hemorrhage, hydrocephalus, extra-axial collection or mass lesion/mass effect. Vascular: No hyperdense vessel or unexpected calcification. Skull: Normal. Negative for fracture or focal lesion. Sinuses/Orbits: No acute finding. Other: None. CT CERVICAL SPINE FINDINGS Alignment: Normal. Skull base and vertebrae: No acute fracture. No primary bone lesion or focal pathologic process. Soft tissues and spinal canal: No prevertebral fluid or swelling. No visible canal hematoma. Disc levels: Corpectomy and anterior fusion of C3 through C7. Upper chest: Negative. Other: None. IMPRESSION: 1. No acute intracranial pathology. 2. No fracture or static subluxation of the cervical spine. 3. Corpectomy and anterior fusion of C3 through C7. Electronically Signed   By: Jearld Lesch M.D.   On: 10/29/2022 12:44   DG Shoulder 1V Left  Result Date:  10/29/2022 CLINICAL DATA:  Left arm pain after fall. EXAM: LEFT SHOULDER COMPARISON:  None Available. FINDINGS: Moderately displaced proximal left humeral neck fracture is noted. No dislocation is seen. IMPRESSION: Moderately displaced proximal left humeral neck fracture. Electronically Signed   By: Lupita Raider M.D.   On: 10/29/2022 12:31   DG Humerus Left  Result Date: 10/29/2022 CLINICAL DATA:  Left arm pain after fall today. EXAM: LEFT HUMERUS - 2+ VIEW COMPARISON:  None Available. FINDINGS: Moderately displaced proximal left humeral neck fracture is noted. Remaining portion of left humerus is unremarkable. IMPRESSION: Moderately displaced proximal left humeral neck fracture. Electronically Signed   By: Lupita Raider M.D.   On: 10/29/2022 12:30    Assessment/Plan:     Principal Problem:   Humerus fracture Active Problems:   Essential hypertension   CAD (coronary artery disease), autologous vein bypass graft   OSA on CPAP   Type 2 diabetes mellitus with diabetic chronic kidney disease (HCC)   Chronic kidney disease, stage 3b (HCC)   Hyponatremia   Ground-level fall   Leukocytosis   Chronic diastolic CHF (congestive heart failure) (HCC)  Patient is stable.  Continue sling and swath treatment for left upper extremity.  Patient should nonweightbearing the left upper extremity and wear her sling and swath at all times.  Patient will follow-up with [facility] in [place] in 10 to 14 days after discharge.  No plan for surgical intervention for either of her fractures at this time.    Rhonda Tyler , MD 10/30/2022, 4:01 PM

## 2022-10-30 NOTE — Care Management Obs Status (Deleted)
MEDICARE OBSERVATION STATUS NOTIFICATION   Patient Details  Name: Rhonda BOULER MRN: [id number] Date of Birth: [birth year]   Medicare Observation Status Notification Given:  Yes    Kreg Shropshire, RN 10/30/2022, 2:30 PM

## 2022-10-30 NOTE — TOC Initial Note (Signed)
Transition of Care [facility]) - Initial/Assessment Note    Patient Details  Name: Rhonda Tyler MRN: [id number] Date of Birth: 11/28/1940  Transition of Care [facility]) CM/SW Contact:    Kreg Shropshire, RN Phone Number: 10/30/2022, 2:45 PM  Clinical Narrative:                 20- Spoke with pt regarding ob letter. Agreed for cm to sign on pt behalf. She cannot qualify for SNF due to ob status. Informed UR nurse and MD. MD stated pt can d/c with home health services. She was working with [facility] PT, RN, OT, and ST. Sent message to [employer] [facility]. Pt has walker and cane. She has an aid that comes to home x3 a week. She has family that will provide ride home for hospital. F/u with pcp Dr. Dareen Piano.  Expected Discharge Plan: Home w Home Health Services Barriers to Discharge: Continued Medical Work up   Patient Goals and CMS Choice            Expected Discharge Plan and Services       Living arrangements for the past 2 months: Single Family Home (Has an aid come in x3 a week)                           HH Arranged: Charity fundraiser, PT HH Agency: [facility] Date HH Agency Contacted: 10/30/22 Time HH Agency Contacted: [phone] Representative spoke with at [facility] Agency: Felib[facility]  Prior Living Arrangements/Services Living arrangements for the past 2 months: Single Family Home (Has an aid come in x3 a week) Lives with:: Self   Do you feel safe going back to the place where you live?: Yes          Current home services: Home OT, Home PT, Home RN    Activities of Daily Living      Permission Sought/Granted                  Emotional Assessment Appearance:: Appears stated age Attitude/Demeanor/Rapport: Engaged Affect (typically observed): Accepting Orientation: : Oriented to Self, Oriented to Situation, Oriented to  Time, Oriented to Place   Psych Involvement: No (comment)  Admission diagnosis:  Hyponatremia  [E87.1] Injury of head, initial encounter [S09.90XA] Fall, initial encounter [W19.XXXA] Closed displaced fracture of left clavicle, unspecified part of clavicle, initial encounter [S42.002A] Closed fracture of proximal end of left humerus, unspecified fracture morphology, initial encounter [S42.202A] Ground-level fall [W18.30XA] Patient Active Problem List   Diagnosis Date Noted   Ground-level fall 10/29/2022   Humerus fracture 10/29/2022   Leukocytosis 10/29/2022   Pneumonia 09/13/2022   Elevated CK 09/13/2022   Generalized weakness 09/13/2022   Hyponatremia 10/14/2021   Hyperlipemia    Type II diabetes mellitus with renal manifestations (HCC)    Chronic kidney disease, stage 3b (HCC)    SVT (supraventricular tachycardia)    Elevated troponin    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   Periprosthetic fracture around internal prosthetic left knee joint 05/05/2021   Idiopathic gout, unspecified site 11/20/2020   Itching 11/20/2020   Anemia in chronic kidney disease 10/16/2020   Heart failure, unspecified (HCC) 10/16/2020   Hyperparathyroidism due to renal insufficiency (HCC) 10/16/2020   Proteinuria, unspecified 10/16/2020   Chronic kidney disease, stage 4 (severe) (HCC) 06/03/2020   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) 01/22/2020   Arthritis of left shoulder  region 01/06/2020   Iron deficiency 01/06/2020   Chest pain 01/05/2020   AKI (acute kidney injury) [facility])    Essential hypertension    OSA on CPAP 09/05/2019   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 ([place]), [place] - [address] [place] ([place]) [place] [address] Phone: [phone] Fax: [phone]  [facility] [address] [place] [address] Phone: [phone] Fax: [phone]     Social Determinants of Health (SDOH) Social History: SDOH Screenings   Food Insecurity: No Food Insecurity (09/13/2022)  Housing: Low Risk  (09/13/2022)  Transportation Needs: No Transportation Needs (09/13/2022)  Utilities: Not At Risk (09/13/2022)  Tobacco Use: Medium Risk (10/29/2022)   SDOH Interventions:     Readmission Risk Interventions    09/15/2022   10:21 AM  Readmission Risk Prevention Plan  Transportation Screening Complete  PCP or Specialist Appt within 3-5 Days Complete  HRI or Home Care Consult Complete  Social Work Consult for Recovery Care Planning/Counseling Complete  Palliative Care Screening Complete  Medication Review [employer]) Complete

## 2022-10-30 NOTE — Hospital Course (Signed)
82 y.o. female with medical history significant of cirrhosis 2/2 NASH, CKD stage IIIb, type 2 diabetes, hypertension, hypertension, hyperlipidemia, CAD, sleep apnea, who presents to the ED due to a ground-level fall.   Rhonda Tyler states she was in her usual state of health this morning when her glucose monitor started beeping that her sugar was low but she cannot recall what it was.  She started walking to her laundry room to get some clothing before going to the kitchen.  She turned and believes she may have lost her footing falling down onto her knees and then landing onto her left shoulder.  She states that she did try to catch herself but was unable to she did end up hitting the back of her head as well but denies any loss of consciousness.  Prior to the fall, she is uncertain if she was experiencing any dizziness but does not believe so.  She denies any palpitations prior to the fall but notes that every once in a while, she has been experiencing palpitations over the last several months.   6/21.  Physical therapy recommending rehab.  Patient has left arm immobilized with left proximal humerus fracture. 6/22.  Sodium dropped down to 125 and creatinine up to 2.75.  Bladder scan showed 275 mL.  Fluid bolus today and IV fluids.  Nephrology consultation. 6/23.  Creatinine rising again today up to 4.01 with a GFR of 11.  Continue IV fluid hydration.  Salt tabs but started for hyponatremia. 6/24.  Creatinine seems to have plateaued at 4.02.  Continue IV fluid hydration.  Patient with poor urine output.  Right arm twitching likely secondary to gabapentin sticking around in his system too long with acute kidney injury.  Gabapentin discontinued last night.  MRI of the brain is negative.  MRI of the cervical spine showed prior hardware and mild spinal canal stenosis. 6/25.  Patient still having right arm twitching likely secondary to gabapentin.  Case discussed with neurologist who mentioned twitching should  improve once creatinine down to about 1.5.  Patient's creatinine 2.78.

## 2022-10-30 NOTE — Care Management Obs Status (Signed)
MEDICARE OBSERVATION STATUS NOTIFICATION   Patient Details  Name: Rhonda Tyler MRN: [id number] Date of Birth: 09-Mar-1941   Medicare Observation Status Notification Given:  Yes    Kreg Shropshire, RN 10/30/2022, 2:28 PM

## 2022-10-30 NOTE — Consult Note (Signed)
WOC Nurse Consult Note: Reason for Consult: sacral partial thickness wounds due to irritant contact dermatitis vs PI vs trauma (fall) Wound type: moisture  ICD-10 CM Codes for Irritant Dermatitis  L24A2 - Due to fecal, urinary or dual incontinence L24A9 - Due to friction or contact with other specified body fluids L30.4  - Erythema intertrigo. Also used for abrasion of the hand, chafing of the skin, dermatitis due to sweating and friction, friction dermatitis, friction eczema, and genital/thigh intertrigo.   Pressure Injury POA: N/A Measurement:To be obtained by Bedside RN with next dressing change and documented on Nursing Flow Sheet Wound NWG:NFAO, moist Drainage (amount, consistency, odor) small yellow Periwound: intact Dressing procedure/placement/frequency:I have provided Nursing with guidance in the care of these lesions using a daily cleanse with NS and covering of the lesions with an antimicrobial nonadherent (xeroform) topped/secured with a silicone foam dressing. This is supported with turning and repositioning to minimize time in the supine position. The heels are to be floated. Perineal care using our house skin care products is recommended.  WOC nursing team will not follow, but will remain available to this patient, the nursing and medical teams.  Please re-consult if needed.  Thank you for inviting [place] to participate in this patient's Plan of Care.  Ladona Mow, MSN, RN, CNS, GNP, Leda Min, [employer], [employer] phone:  [phone]

## 2022-10-30 NOTE — Progress Notes (Signed)
Progress Note   Patient: Rhonda Tyler WUJ:[id number] DOB: 05-21-1940 DOA: 10/29/2022     0 DOS: the patient was seen and examined on 10/30/2022   Brief hospital course: 82 y.o. female with medical history significant of cirrhosis 2/2 NASH, CKD stage IIIb, type 2 diabetes, hypertension, hypertension, hyperlipidemia, CAD, sleep apnea, who presents to the ED due to a ground-level fall.   Mrs. Szuch states she was in her usual state of health this morning when her glucose monitor started beeping that her sugar was low but she cannot recall what it was.  She started walking to her laundry room to get some clothing before going to the kitchen.  She turned and believes she may have lost her footing falling down onto her knees and then landing onto her left shoulder.  She states that she did try to catch herself but was unable to she did end up hitting the back of her head as well but denies any loss of consciousness.  Prior to the fall, she is uncertain if she was experiencing any dizziness but does not believe so.  She denies any palpitations prior to the fall but notes that every once in a while, she has been experiencing palpitations over the last several months.   6/21.  Physical therapy recommending rehab.  Patient has left arm immobilized with left proximal humerus fracture.  Assessment and Plan: * Humerus fracture CT imaging demonstrates an impacted fracture of the left proximal humerus.  Orthopedic surgery recommending immobilization as treatment.  Continue arm sling.  Patient received IV and oral pain medications.  Ground-level fall Physical therapy recommending rehab  Hyponatremia Sodium 131.  Will give gentle fluids overnight.  Leukocytosis Likely reactive from fall  Chronic diastolic CHF (congestive heart failure) (HCC) With creatinine rise hold diuretics, lisinopril and spironolactone.  Chronic kidney disease, stage 3b (HCC) With creatinine going up to 1.65 and GFR down to 31 will  hold lisinopril spironolactone and diuretic today.  Type 2 diabetes mellitus with diabetic chronic kidney disease (HCC) - Hold home insulin - SSI, sensitive  OSA on CPAP - CPAP at bedtime  CAD (coronary artery disease), autologous vein bypass graft Patient on propranolol  Essential hypertension Hold spironolactone and lisinopril with slight elevation in creatinine.        Subjective: Patient not feeling too good.  Did not do too well with physical therapy.  Normally walks with a walker and lives by herself.  Does have a friend that helped out a little bit.  Quite a bit of pain and her left arm.  Physical Exam: Vitals:   10/30/22 0121 10/30/22 0336 10/30/22 0748 10/30/22 1512  BP: (!) 120/45 (!) 112/45 (!) 107/47 (!) 110/44  Pulse: 83 81 81 76  Resp:  16 15 19   Temp:  97.8 F (36.6 C) 98.2 F (36.8 C) 98.5 F (36.9 C)  TempSrc:  Oral    SpO2:  97% 93% 99%  Weight:      Height:       Physical Exam HENT:     Head: Normocephalic.     Mouth/Throat:     Pharynx: No oropharyngeal exudate.  Eyes:     General: Lids are normal.     Conjunctiva/sclera: Conjunctivae normal.  Cardiovascular:     Rate and Rhythm: Normal rate and regular rhythm.     Heart sounds: Normal heart sounds, S1 normal and S2 normal.  Pulmonary:     Breath sounds: Examination of the right-lower field reveals decreased breath  sounds. Examination of the left-lower field reveals decreased breath sounds. Decreased breath sounds present. No wheezing, rhonchi or rales.  Abdominal:     Palpations: Abdomen is soft.     Tenderness: There is no abdominal tenderness.  Musculoskeletal:     Right lower leg: Swelling present.     Left lower leg: Swelling present.  Skin:    General: Skin is warm.     Findings: No rash.     Comments: Patient sitting in the chair unable to look at her backside at this time  Neurological:     Mental Status: She is alert and oriented to person, place, and time.     Data  Reviewed: Sodium 131, creatinine 1.65 with a GFR of 31, white blood cell count 11.9, hemoglobin 10.0, platelet count 192  Family Communication: Left message for friend  Disposition: Status is: Observation Patient unsafe disposition currently.  Continue working with physical therapy. Planned Discharge Destination: Physical therapy currently recommending rehab but patient will not be able to go out to rehab while in observation status.    Time spent: 28 minutes  Author: Alford Highland, MD 10/30/2022 3:59 PM  For on call review [email or web address].

## 2022-10-30 NOTE — Assessment & Plan Note (Signed)
With creatinine rise hold diuretics, lisinopril and spironolactone.

## 2022-10-30 NOTE — Evaluation (Signed)
Occupational Therapy Evaluation Patient Details Name: Rhonda Tyler MRN: [id number] DOB: Jul 05, 1940 Today's Date: 10/30/2022   History of Present Illness Pt is a 82 y.o. female with PMH consisting of Cirrhosis 2/2 NASH, CKD stage IIIb, type 2 diabetes, HTN, HDL, CAD, sleep apnea. Pt admitted to ED 6/20 after falling at home and reported hitting head/neck, unsure as to cause of fall. CT imaging demonstrates impacted fx of left proximal humerus. MD assessment includes hyponatremia with non-operative management of L humerus fx.   Clinical Impression   [name] presents with generalized weakness, limited endurance, impaired L UE use, and pain. She is A&Ox4. Although she has had pain meds < 2 hours earlier, pt endorses 8/10 pain in her shoulder, radiating down into her upper arm. She requires hand-held Min A to come into standing, close SUPV for safety with ambulation, w/ pt endorsing feeling weak and dizzy in standing. She requires Mod A for transitioning sit<supine, for positioning in bed, and for opening food containers.  Provided educ re: WBing precautions, discussed DC recs, with pt stating she would like to return home but acknowledging that a short-term stay at a SNF will likely be necessary, as at present she is limited in her ability to perform any ADLs.   Recommendations for follow up therapy are one component of a multi-disciplinary discharge planning process, led by the attending physician.  Recommendations may be updated based on patient status, additional functional criteria and insurance authorization.   Assistance Recommended at Discharge Frequent or constant Supervision/Assistance  Patient can return home with the following A little help with walking and/or transfers;A lot of help with bathing/dressing/bathroom;Assistance with cooking/housework;Assist for transportation    Functional Status Assessment  Patient has had a recent decline in their functional status and demonstrates the  ability to make significant improvements in function in a reasonable and predictable amount of time.  Equipment Recommendations  Other (comment) (hemi-walker?)    Recommendations for Other Services       Precautions / Restrictions Precautions Precautions: Fall Required Braces or Orthoses: Sling Restrictions Weight Bearing Restrictions: Yes LUE Weight Bearing: Non weight bearing      Mobility Bed Mobility Overal bed mobility: Needs Assistance Bed Mobility: Sit to Supine     Supine to sit: Mod assist     General bed mobility comments: Mod A for elevating b/l LE, supporting shoulders, repositioning in bed    Transfers Overall transfer level: Needs assistance Equipment used: 1 person hand held assist Transfers: Sit to/from Stand, Bed to chair/wheelchair/BSC Sit to Stand: Min assist           General transfer comment: Pt had difficulty coming into standing from recliner. Said she felt "weak" in standing and required support, close SUPV for safety while transitioning to bed      Balance Overall balance assessment: Needs assistance Sitting-balance support: Feet supported, Single extremity supported Sitting balance-Leahy Scale: Fair     Standing balance support: Single extremity supported, During functional activity Standing balance-Leahy Scale: Fair Standing balance comment: Pt able to stand ~ 3 minutes; however, reports feeling "weak" and a bit dizzy                           ADL either performed or assessed with clinical judgement   ADL Overall ADL's : Needs assistance/impaired Eating/Feeding: Minimal assistance Eating/Feeding Details (indicate cue type and reason): Needs assist with opening all food containers, then able to feed self, with minimal spillage  Vision         Perception     Praxis      Pertinent Vitals/Pain Pain Assessment Pain Assessment: 0-10 Pain Score: 8  Pain  Location: L arm/shoulder Pain Descriptors / Indicators: Aching, Penetrating, Grimacing, Moaning Pain Intervention(s): Repositioned, Relaxation, Limited activity within patient's tolerance, Patient requesting pain meds-RN notified     Hand Dominance Right   Extremity/Trunk Assessment Upper Extremity Assessment Upper Extremity Assessment: Generalized weakness;LUE deficits/detail LUE Deficits / Details: NWB, sling   Lower Extremity Assessment Lower Extremity Assessment: Generalized weakness       Communication Communication Communication: No difficulties   Cognition Arousal/Alertness: Awake/alert Behavior During Therapy: WFL for tasks assessed/performed Overall Cognitive Status: Within Functional Limits for tasks assessed                                       General Comments       Exercises Other Exercises Other Exercises: Educ re: DC recs, WBing precautions, strategies for grooming, toileting, dressing w/ 1 UE   Shoulder Instructions      Home Living Family/patient expects to be discharged to:: Private residence Living Arrangements: Alone Available Help at Discharge: Neighbor;Personal care attendant;Available PRN/intermittently Type of Home: House Home Access: Stairs to enter [employer] of Steps: 1 Entrance Stairs-Rails: None Home Layout: One level     Bathroom Shower/Tub: Chief Strategy Officer: Standard Bathroom Accessibility: No   Home Equipment: Agricultural consultant (2 wheels);Cane - single point;BSC/3in1;Toilet riser;Grab bars - tub/shower;Shower seat          Prior Functioning/Environment Prior Level of Function : Needs assist             Mobility Comments: Ambulates w/o AD in the home, uses RW for community outings. 2 falls in previous 3 weeks. ADLs Comments: Mod I with BADL. Has aide 2-3x/week for 6-8 hrs/day. Aide assists with driving, shopping (pt goes along), cooking. Pt drives short distances only (<2 miles  from home).        OT Problem List: Decreased strength;Decreased range of motion;Decreased activity tolerance;Decreased coordination;Pain;Impaired UE functional use      OT Treatment/Interventions: Self-care/ADL training;Therapeutic activities;Patient/family education;DME and/or AE instruction;Therapeutic exercise;Balance training;Energy conservation    OT Goals(Current goals can be found in the care plan section) Acute Rehab OT Goals Patient Stated Goal: to go home OT Goal Formulation: With patient Time For Goal Achievement: 11/13/22 Potential to Achieve Goals: Good ADL Goals Pt Will Perform Grooming: with modified independence;standing;sitting (standing or sitting, while maintaining WBing precautions) Pt Will Perform Upper Body Dressing: with min assist;sitting;standing Pt Will Transfer to Toilet: with modified independence;regular height toilet;ambulating  OT Frequency: Min 2X/week    Co-evaluation              AM-PAC OT "6 Clicks" Daily Activity     Outcome Measure Help from another person eating meals?: A Little Help from another person taking care of personal grooming?: A Little Help from another person toileting, which includes using toliet, bedpan, or urinal?: A Lot Help from another person bathing (including washing, rinsing, drying)?: A Lot Help from another person to put on and taking off regular upper body clothing?: A Lot Help from another person to put on and taking off regular lower body clothing?: A Lot 6 Click Score: 14   End of Session Nurse Communication: Patient requests pain meds  Activity Tolerance: Patient tolerated treatment well Patient left:  in bed;with bed alarm set;with call bell/phone within reach  OT Visit Diagnosis: Unsteadiness on feet (R26.81);Muscle weakness (generalized) (M62.81);Pain;Repeated falls (R29.6) Pain - Right/Left: Left Pain - part of body: Shoulder;Arm                Time: 4098-1191 OT Time Calculation (min): 40  min Charges:  OT General Charges $OT Visit: 1 Visit OT Evaluation $OT Eval Low Complexity: 1 Low OT Treatments $Self Care/Home Management : 23-37 mins Latina Craver, PhD, MS, OTR/L 10/30/22, 1:45 PM

## 2022-10-30 NOTE — Evaluation (Signed)
Physical Therapy Evaluation Patient Details Name: Rhonda Tyler MRN: [id number] DOB: 19-Jul-1940 Today's Date: 10/30/2022  History of Present Illness  Pt is a 82 y.o. female with PMH consisting of Cirrhosis 2/2 NASH, CKD stage IIIb, type 2 diabetes, HTN, HDL, CAD, sleep apnea. Pt admitted to ED 6/20 after falling at home and reported hitting head/neck, unsure as to cause of fall. CT imaging demonstrates impacted fx of left proximal humerus. MD assessment includes hyponatremia with non-operative management of L humerus fx.  Clinical Impression  Pt pleasant and motivated for therapy. Pt initially groggy at start of session but was more alert once moving around. Her O2 was 90% and HR was 91 at start of session with poor pleth; no dizziness/lightheadedness reported throughout session. Pt required min A for supine > sit for LUE management/support. She required min A for STS to prevent posterior LOB. Pt able to take 4-5 small steps at EOB to get to recliner with min guard and HW. Pt overall demonstrated cautious/guarded movement with all mobility tasks secondary to LUE pain. Pt's ambulation distance limited due to pain as well. Pt will benefit from continued PT services upon discharge to safely address deficits listed in patient problem list for decreased caregiver assistance and eventual return to PLOF.      Recommendations for follow up therapy are one component of a multi-disciplinary discharge planning process, led by the attending physician.  Recommendations may be updated based on patient status, additional functional criteria and insurance authorization.  Follow Up Recommendations Can patient physically be transported by private vehicle: No     Assistance Recommended at Discharge Frequent or constant Supervision/Assistance  Patient can return home with the following  A little help with walking and/or transfers;A little help with bathing/dressing/bathroom;Assistance with cooking/housework;Assist  for transportation;Help with stairs or ramp for entrance    Equipment Recommendations Other (comment) (TBD)  Recommendations for Other Services       Functional Status Assessment Patient has had a recent decline in their functional status and demonstrates the ability to make significant improvements in function in a reasonable and predictable amount of time.     Precautions / Restrictions Precautions Precautions: Fall Required Braces or Orthoses: Sling Restrictions Weight Bearing Restrictions: Yes LUE Weight Bearing: Non weight bearing      Mobility  Bed Mobility Overal bed mobility: Needs Assistance Bed Mobility: Supine to Sit     Supine to sit: Min assist     General bed mobility comments: Min A for LUE management/support    Transfers Overall transfer level: Needs assistance Equipment used: Hemi-walker Transfers: Sit to/from Stand Sit to Stand: Min A to prevent posterior LOB           General transfer comment: Min guard for safety. Pt able to stand with no physical assistance. Minimal cues needed for RUE placement.    Ambulation/Gait Ambulation/Gait assistance: Min guard Gait Distance (Feet): 3 Feet Assistive device: Hemi-walker Gait Pattern/deviations: Step-to pattern, Decreased step length - left, Decreased step length - right, Shuffle, Trunk flexed Gait velocity: decreased     General Gait Details: Very guarded/cautious movement secondary to LUE pain. Pt able to take 4-5 small, shuffling steps to get to recliner with min guard and HW for safety, distance limited by pain.  Stairs            Wheelchair Mobility    Modified Rankin (Stroke Patients Only)       Balance Overall balance assessment: Needs assistance Sitting-balance support: Feet supported, Single extremity supported  Sitting balance-Leahy Scale: Fair     Standing balance support: Reliant on assistive device for balance, During functional activity, Single extremity  supported Standing balance-Leahy Scale: Poor Standing balance comment: Pt able to stand with RUE on HW ~1-2 mins with no LOB. Slight posterior lean upon standing needing min A to correct.                             Pertinent Vitals/Pain Pain Assessment Pain Assessment: 0-10 Pain Score: 8  Pain Location: L arm/shoulder Pain Intervention(s): RN gave pain meds during session, Repositioned, Monitored during session, Limited activity within patient's tolerance    Home Living Family/patient expects to be discharged to:: Private residence Living Arrangements: Alone Available Help at Discharge: Neighbor;Personal care attendant;Available PRN/intermittently Type of Home: House Home Access: Stairs to enter Entrance Stairs-Rails: None Entrance Stairs-Number of Steps: 1   Home Layout: One level Home Equipment: Agricultural consultant (2 wheels);Cane - single point;BSC/3in1;Toilet riser;Grab bars - tub/shower;Shower seat      Prior Function Prior Level of Function : Independent/Modified Independent             Mobility Comments: Mod I with RW 24/7 for mobility, described household ambulator. 2 falls in last 6 months but unsure as to cause. ADLs Comments: Ind. with ADLs, transportation. Assist with errands. Personal care aide 2-3x/week for ~6 hours at a time.     Hand Dominance   Dominant Hand: Right    Extremity/Trunk Assessment   Upper Extremity Assessment Upper Extremity Assessment: Generalized weakness    Lower Extremity Assessment Lower Extremity Assessment: Generalized weakness       Communication   Communication: No difficulties  Cognition Arousal/Alertness: Awake/alert Behavior During Therapy: WFL for tasks assessed/performed Overall Cognitive Status: Within Functional Limits for tasks assessed                                          General Comments      Exercises Total Joint Exercises Marching in Standing: AROM, Both, 5 reps, Standing    Assessment/Plan    PT Assessment Patient needs continued PT services  PT Problem List Decreased strength;Pain;Decreased activity tolerance;Decreased balance;Decreased mobility       PT Treatment Interventions DME instruction;Therapeutic exercise;Gait training;Stair training;Therapeutic activities;Patient/family education    PT Goals (Current goals can be found in the Care Plan section)  Acute Rehab PT Goals Patient Stated Goal: Walker farther/better, increase strength PT Goal Formulation: With patient Time For Goal Achievement: 11/12/22 Potential to Achieve Goals: Good    Frequency 7X/week     Co-evaluation               AM-PAC PT "6 Clicks" Mobility  Outcome Measure Help needed turning from your back to your side while in a flat bed without using bedrails?: A Little Help needed moving from lying on your back to sitting on the side of a flat bed without using bedrails?: A Lot Help needed moving to and from a bed to a chair (including a wheelchair)?: A Little Help needed standing up from a chair using your arms (e.g., wheelchair or bedside chair)?: A Little Help needed to walk in hospital room?: A Little Help needed climbing 3-5 steps with a railing? : A Lot 6 Click Score: 16    End of Session Equipment Utilized During Treatment: Gait belt Activity Tolerance: Patient limited  by pain Patient left: in chair;with call bell/phone within reach;with chair alarm set Nurse Communication: Weight bearing status;Mobility status PT Visit Diagnosis: Repeated falls (R29.6);History of falling (Z91.81);Pain;Difficulty in walking, not elsewhere classified (R26.2);Muscle weakness (generalized) (M62.81) Pain - Right/Left: Left Pain - part of body: Arm;Shoulder    Time: 1610-9604 PT Time Calculation (min) (ACUTE ONLY): 32 min   Charges:              Cena Benton, SPT 10/30/22, 1:58 PM

## 2022-10-31 ENCOUNTER — Inpatient Hospital Stay: Payer: Medicare Other

## 2022-10-31 DIAGNOSIS — E1122 Type 2 diabetes mellitus with diabetic chronic kidney disease: Secondary | ICD-10-CM

## 2022-10-31 DIAGNOSIS — G928 Other toxic encephalopathy: Secondary | ICD-10-CM | POA: Diagnosis not present

## 2022-10-31 DIAGNOSIS — S42209S Unspecified fracture of upper end of unspecified humerus, sequela: Secondary | ICD-10-CM | POA: Diagnosis not present

## 2022-10-31 DIAGNOSIS — D509 Iron deficiency anemia, unspecified: Secondary | ICD-10-CM | POA: Diagnosis present

## 2022-10-31 DIAGNOSIS — D72829 Elevated white blood cell count, unspecified: Secondary | ICD-10-CM | POA: Diagnosis present

## 2022-10-31 DIAGNOSIS — G4733 Obstructive sleep apnea (adult) (pediatric): Secondary | ICD-10-CM | POA: Diagnosis present

## 2022-10-31 DIAGNOSIS — Z8249 Family history of ischemic heart disease and other diseases of the circulatory system: Secondary | ICD-10-CM | POA: Diagnosis not present

## 2022-10-31 DIAGNOSIS — R253 Fasciculation: Secondary | ICD-10-CM | POA: Diagnosis not present

## 2022-10-31 DIAGNOSIS — N189 Chronic kidney disease, unspecified: Secondary | ICD-10-CM | POA: Diagnosis present

## 2022-10-31 DIAGNOSIS — E1140 Type 2 diabetes mellitus with diabetic neuropathy, unspecified: Secondary | ICD-10-CM | POA: Diagnosis present

## 2022-10-31 DIAGNOSIS — N1832 Chronic kidney disease, stage 3b: Secondary | ICD-10-CM

## 2022-10-31 DIAGNOSIS — S42295D Other nondisplaced fracture of upper end of left humerus, subsequent encounter for fracture with routine healing: Secondary | ICD-10-CM | POA: Diagnosis not present

## 2022-10-31 DIAGNOSIS — E871 Hypo-osmolality and hyponatremia: Secondary | ICD-10-CM | POA: Diagnosis present

## 2022-10-31 DIAGNOSIS — S0990XA Unspecified injury of head, initial encounter: Secondary | ICD-10-CM | POA: Diagnosis present

## 2022-10-31 DIAGNOSIS — N179 Acute kidney failure, unspecified: Secondary | ICD-10-CM

## 2022-10-31 DIAGNOSIS — K7581 Nonalcoholic steatohepatitis (NASH): Secondary | ICD-10-CM | POA: Diagnosis present

## 2022-10-31 DIAGNOSIS — Y92239 Unspecified place in hospital as the place of occurrence of the external cause: Secondary | ICD-10-CM | POA: Diagnosis present

## 2022-10-31 DIAGNOSIS — I25710 Atherosclerosis of autologous vein coronary artery bypass graft(s) with unstable angina pectoris: Secondary | ICD-10-CM

## 2022-10-31 DIAGNOSIS — I959 Hypotension, unspecified: Secondary | ICD-10-CM | POA: Diagnosis present

## 2022-10-31 DIAGNOSIS — Y92009 Unspecified place in unspecified non-institutional (private) residence as the place of occurrence of the external cause: Secondary | ICD-10-CM | POA: Diagnosis not present

## 2022-10-31 DIAGNOSIS — I2510 Atherosclerotic heart disease of native coronary artery without angina pectoris: Secondary | ICD-10-CM | POA: Diagnosis present

## 2022-10-31 DIAGNOSIS — S42002A Fracture of unspecified part of left clavicle, initial encounter for closed fracture: Secondary | ICD-10-CM | POA: Diagnosis present

## 2022-10-31 DIAGNOSIS — Z794 Long term (current) use of insulin: Secondary | ICD-10-CM | POA: Diagnosis not present

## 2022-10-31 DIAGNOSIS — I130 Hypertensive heart and chronic kidney disease with heart failure and stage 1 through stage 4 chronic kidney disease, or unspecified chronic kidney disease: Secondary | ICD-10-CM | POA: Diagnosis present

## 2022-10-31 DIAGNOSIS — S42202A Unspecified fracture of upper end of left humerus, initial encounter for closed fracture: Secondary | ICD-10-CM | POA: Diagnosis present

## 2022-10-31 DIAGNOSIS — K219 Gastro-esophageal reflux disease without esophagitis: Secondary | ICD-10-CM | POA: Diagnosis present

## 2022-10-31 DIAGNOSIS — E785 Hyperlipidemia, unspecified: Secondary | ICD-10-CM | POA: Diagnosis present

## 2022-10-31 DIAGNOSIS — K7460 Unspecified cirrhosis of liver: Secondary | ICD-10-CM | POA: Diagnosis present

## 2022-10-31 DIAGNOSIS — W1830XA Fall on same level, unspecified, initial encounter: Secondary | ICD-10-CM

## 2022-10-31 DIAGNOSIS — Z87891 Personal history of nicotine dependence: Secondary | ICD-10-CM | POA: Diagnosis not present

## 2022-10-31 DIAGNOSIS — I5032 Chronic diastolic (congestive) heart failure: Secondary | ICD-10-CM

## 2022-10-31 DIAGNOSIS — I10 Essential (primary) hypertension: Secondary | ICD-10-CM

## 2022-10-31 DIAGNOSIS — N178 Other acute kidney failure: Secondary | ICD-10-CM | POA: Diagnosis present

## 2022-10-31 DIAGNOSIS — N1411 Contrast-induced nephropathy: Secondary | ICD-10-CM | POA: Diagnosis present

## 2022-10-31 LAB — BASIC METABOLIC PANEL
Anion gap: 11 (ref 5–15)
BUN: 57 mg/dL — ABNORMAL HIGH (ref 8–23)
CO2: 21 mmol/L — ABNORMAL LOW (ref 22–32)
Calcium: 8.1 mg/dL — ABNORMAL LOW (ref 8.9–10.3)
Chloride: 93 mmol/L — ABNORMAL LOW (ref 98–111)
Creatinine, Ser: 2.75 mg/dL — ABNORMAL HIGH (ref 0.44–1.00)
GFR, Estimated: 17 mL/min — ABNORMAL LOW (ref >60)
Glucose, Bld: 307 mg/dL — ABNORMAL HIGH (ref 70–99)
Potassium: 4.6 mmol/L (ref 3.5–5.1)
Sodium: 125 mmol/L — ABNORMAL LOW (ref 135–145)

## 2022-10-31 LAB — CBC
HCT: 28 % — ABNORMAL LOW (ref 36.0–46.0)
Hemoglobin: 8.8 g/dL — ABNORMAL LOW (ref 12.0–15.0)
MCH: 27.2 pg (ref 26.0–34.0)
MCHC: 31.4 g/dL (ref 30.0–36.0)
MCV: 86.7 fL (ref 80.0–100.0)
Platelets: 161 10*3/uL (ref 150–400)
RBC: 3.23 MIL/uL — ABNORMAL LOW (ref 3.87–5.11)
RDW: 14.7 % (ref 11.5–15.5)
WBC: 9.5 10*3/uL (ref 4.0–10.5)
nRBC: 0 % (ref 0.0–0.2)

## 2022-10-31 LAB — GLUCOSE, CAPILLARY
Glucose-Capillary: 289 mg/dL — ABNORMAL HIGH (ref 70–99)
Glucose-Capillary: 268 mg/dL — ABNORMAL HIGH (ref 70–99)
Glucose-Capillary: 293 mg/dL — ABNORMAL HIGH (ref 70–99)
Glucose-Capillary: 283 mg/dL — ABNORMAL HIGH (ref 70–99)

## 2022-10-31 MED ORDER — SODIUM CHLORIDE 0.9 % IV BOLUS
500.0000 mL | Freq: Once | INTRAVENOUS | Status: AC
  Administered 2022-10-31: 500 mL via INTRAVENOUS

## 2022-10-31 MED ORDER — INSULIN GLARGINE-YFGN 100 UNIT/ML ~~LOC~~ SOLN
5.0000 [IU] | Freq: Every day | SUBCUTANEOUS | Status: DC
  Administered 2022-10-31 – 2022-11-04 (×5): 5 [IU] via SUBCUTANEOUS
  Filled 2022-10-31 (×7): qty 0.05

## 2022-10-31 MED ORDER — HYDROMORPHONE HCL 1 MG/ML IJ SOLN
0.5000 mg | INTRAMUSCULAR | Status: DC | PRN
  Administered 2022-10-31 – 2022-11-01 (×2): 0.5 mg via INTRAVENOUS
  Filled 2022-10-31 (×2): qty 0.5

## 2022-10-31 MED ORDER — HEPARIN SODIUM (PORCINE) 5000 UNIT/ML IJ SOLN
5000.0000 [IU] | Freq: Three times a day (TID) | INTRAMUSCULAR | Status: DC
  Administered 2022-10-31 – 2022-11-05 (×16): 5000 [IU] via SUBCUTANEOUS
  Filled 2022-10-31 (×15): qty 1

## 2022-10-31 MED ORDER — GABAPENTIN 100 MG PO CAPS
100.0000 mg | ORAL_CAPSULE | Freq: Two times a day (BID) | ORAL | Status: DC
  Administered 2022-10-31 – 2022-11-01 (×2): 100 mg via ORAL
  Filled 2022-10-31 (×2): qty 1

## 2022-10-31 NOTE — Progress Notes (Signed)
Bladder scan results 243 mL. No distention noted. Patient states she feels the urge but hasn't been able to release this morning.  Cornell Barman Keith Cancio

## 2022-10-31 NOTE — Assessment & Plan Note (Addendum)
AKI on CKD stage IIIb.  Creatinine was 1.36 on presentation and peaked at 4.02 on 6/24.  Today's creatinine 2.78.  Patient did have a CAT scan with contrast on 6/20.  This is likely the etiology of her acute kidney injury.

## 2022-10-31 NOTE — Progress Notes (Signed)
[facility]  ROUNDING NOTE   Subjective:   Rhonda Tyler was admitted to [facility] on 10/29/2022 for Hyponatremia [E87.1] Injury of head, initial encounter [S09.90XA] Fall, initial encounter [W19.XXXA] Closed displaced fracture of left clavicle, unspecified part of clavicle, initial encounter [S42.002A] Closed fracture of proximal end of left humerus, unspecified fracture morphology, initial encounter [S42.202A] Ground-level fall [W18.30XA]  Patient was last seen by Dr. 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   Nephrology consutled for acute kidney injury on chronic kidney diesase stage IIIB. IV contrast exposure on 6/20 with Ct of abdomen.   Objective:  Vital signs in last 24 hours:  Temp:  [97.7 F (36.5 C)-98.9 F (37.2 C)] 98.9 F (37.2 C) (06/22 0821) Pulse Rate:  [61-76] 61 (06/22 0821) Resp:  [14-19] 14 (06/22 0821) BP: (96-110)/(42-44) 96/42 (06/22 0821) SpO2:  [96 %-100 %] 96 % (06/22 0821) FiO2 (%):  [21 %] 21 % (06/21 2256)  Weight change:  Filed Weights   10/29/22 1128  Weight: 71.7 kg    Intake/Output: I/O last 3 completed shifts: In: 414.6 [P.O.:120; I.V.:294.6] Out: -    Intake/Output this shift:  Total I/O In: 240 [P.O.:240] Out: -   Physical Exam: General: NAD, laying in bed  Head: Normocephalic, atraumatic. Moist oral mucosal membranes  Eyes: Anicteric, PERRL  Neck: Supple, trachea midline  Lungs:  Clear to auscultation  Heart: Regular rate and rhythm  Abdomen:  Soft, nontender,   Extremities:  no peripheral edema. Left upper extremity in sling  Neurologic: Nonfocal, moving all four extremities  Skin: No lesions  Access: none    Basic Metabolic Panel: Recent Labs  Lab 10/29/22 1123 10/30/22 0352 10/31/22 0356  NA 129* 131* 125*  K 4.2 4.1 4.6  CL 93* 99 93*  CO2 22 20* 21*  GLUCOSE 94 123* 307*  BUN 34* 37* 57*  CREATININE 1.36* 1.65* 2.75*  CALCIUM  9.6 8.6* 8.1*    Liver Function Tests: Recent Labs  Lab 10/29/22 1123  AST 28  ALT 16  ALKPHOS 124  BILITOT 1.7*  PROT 7.1  ALBUMIN 4.0   No results for input(s): "LIPASE", "AMYLASE" in the last 168 hours. No results for input(s): "AMMONIA" in the last 168 hours.  CBC: Recent Labs  Lab 10/29/22 1123 10/30/22 0352 10/31/22 0356  WBC 10.8* 11.9* 9.5  HGB 12.3 10.0* 8.8*  HCT 37.2 31.5* 28.0*  MCV 82.9 87.3 86.7  PLT 207 192 161    Cardiac Enzymes: No results for input(s): "CKTOTAL", "CKMB", "CKMBINDEX", "TROPONINI" in the last 168 hours.  BNP: Invalid input(s): "POCBNP"  CBG: Recent Labs  Lab 10/30/22 0745 10/30/22 1136 10/30/22 1652 10/31/22 0820 10/31/22 1221  GLUCAP 106* 223* 285* 289* 268*    Microbiology: Results for orders placed or performed during the hospital encounter of 09/13/22  Urine Culture     Status: Abnormal   Collection Time: 09/13/22  4:10 PM   Specimen: Urine, Random  Result Value Ref Range Status   Specimen Description   Final    URINE, RANDOM Performed at [facility], [address] [place], [place] [address]    Special Requests   Final    NONE Reflexed from [id number] Performed at [facility], [address] [place], [place] [address]    Culture >=100,000 COLONIES/mL ESCHERICHIA COLI (A)  Final   Report Status 09/15/2022 FINAL  Final   Organism ID, Bacteria ESCHERICHIA COLI (A)  Final      Susceptibility   Escherichia coli - MIC*    AMPICILLIN >=32 RESISTANT Resistant     CEFAZOLIN <=4 SENSITIVE Sensitive     CEFEPIME <=0.12 SENSITIVE Sensitive     CEFTRIAXONE <=0.25 SENSITIVE Sensitive     CIPROFLOXACIN <=0.25 SENSITIVE Sensitive     GENTAMICIN <=1 SENSITIVE Sensitive     IMIPENEM <=0.25 SENSITIVE Sensitive     NITROFURANTOIN <=16 SENSITIVE Sensitive     TRIMETH/SULFA >=320 RESISTANT Resistant     AMPICILLIN/SULBACTAM 4 SENSITIVE Sensitive     PIP/TAZO <=4 SENSITIVE Sensitive     * >=100,000  COLONIES/mL ESCHERICHIA COLI  Blood Culture (routine x 2)     Status: None   Collection Time: 09/13/22  5:54 PM   Specimen: BLOOD LEFT HAND  Result Value Ref Range Status   Specimen Description BLOOD LEFT HAND  Final   Special Requests   Final    BOTTLES DRAWN AEROBIC AND ANAEROBIC Blood Culture results may not be optimal due to an inadequate volume of blood received in culture bottles   Culture   Final    NO GROWTH 5 DAYS Performed at [facility], [address] [place], [place] [address]    Report Status 09/18/2022 FINAL  Final  Blood Culture (routine x 2)     Status: None   Collection Time: 09/13/22  5:54 PM   Specimen: BLOOD  Result Value Ref Range Status   Specimen Description BLOOD LEFT ANTECUBITAL  Final   Special Requests   Final    BOTTLES DRAWN AEROBIC AND ANAEROBIC Blood Culture adequate volume   Culture   Final    NO GROWTH 5 DAYS Performed at [facility], [address] [place], [place] [address]    Report Status 09/18/2022 FINAL  Final  SARS Coronavirus 2 by RT PCR (hospital order, performed in [facility] hospital lab) *cepheid single result test* Anterior Nasal Swab     Status: None   Collection Time: 09/14/22  2:24 PM   Specimen: Anterior Nasal Swab  Result Value Ref Range Status   SARS Coronavirus 2 by RT PCR NEGATIVE NEGATIVE Final    Comment: (NOTE) SARS-CoV-2 target nucleic acids are NOT DETECTED.  The SARS-CoV-2 RNA is generally detectable in upper and lower respiratory specimens during the acute phase of infection. The lowest concentration of SARS-CoV-2 viral copies this assay can detect is 250 copies / mL. A negative result does not preclude SARS-CoV-2 infection and should not be used as the sole basis for treatment or other patient management decisions.  A negative result may occur with improper specimen collection / handling, submission of specimen other than nasopharyngeal swab, presence of viral mutation(s) within the areas  targeted by this assay, and inadequate number of viral copies (<250 copies / mL). A negative result must be combined with clinical observations, patient history, and epidemiological information.  Fact Sheet for Patients:   RoadLapTop.co.za  Fact Sheet for Healthcare Providers: http://kim-miller.com/  This test is not yet approved or  cleared by the [place] FDA and has been authorized for detection and/or diagnosis of SARS-CoV-2 by FDA under an Emergency Use Authorization (EUA).  This EUA will remain in effect (meaning this test can be used) for the duration of the COVID-19 declaration under Section 564(b)(1) of the Act, 21 U.S.C. section 360bbb-3(b)(1), unless the authorization is terminated or revoked sooner.  Performed at [facility], [address], [place], [place] [address]   Respiratory (~20 pathogens) panel  by PCR     Status: None   Collection Time: 09/17/22 11:39 PM   Specimen: Nasopharyngeal Swab; Respiratory  Result Value Ref Range Status   Adenovirus NOT DETECTED NOT DETECTED Final   Coronavirus 229E NOT DETECTED NOT DETECTED Final    Comment: (NOTE) The Coronavirus on the Respiratory Panel, DOES NOT test for the novel  Coronavirus (2019 nCoV)    Coronavirus HKU1 NOT DETECTED NOT DETECTED Final   Coronavirus NL63 NOT DETECTED NOT DETECTED Final   Coronavirus OC43 NOT DETECTED NOT DETECTED Final   Metapneumovirus NOT DETECTED NOT DETECTED Final   Rhinovirus / Enterovirus NOT DETECTED NOT DETECTED Final   Influenza A NOT DETECTED NOT DETECTED Final   Influenza B NOT DETECTED NOT DETECTED Final   Parainfluenza Virus 1 NOT DETECTED NOT DETECTED Final   Parainfluenza Virus 2 NOT DETECTED NOT DETECTED Final   Parainfluenza Virus 3 NOT DETECTED NOT DETECTED Final   Parainfluenza Virus 4 NOT DETECTED NOT DETECTED Final   Respiratory Syncytial Virus NOT DETECTED NOT DETECTED Final   Bordetella pertussis NOT  DETECTED NOT DETECTED Final   Bordetella Parapertussis NOT DETECTED NOT DETECTED Final   Chlamydophila pneumoniae NOT DETECTED NOT DETECTED Final   Mycoplasma pneumoniae NOT DETECTED NOT DETECTED Final    Comment: Performed at [facility], [address] [place], [place] [address]    Coagulation Studies: No results for input(s): "LABPROT", "INR" in the last 72 hours.  Urinalysis: Recent Labs    10/30/22 0726  COLORURINE YELLOW*  LABSPEC 1.030  PHURINE 5.0  GLUCOSEU NEGATIVE  HGBUR NEGATIVE  BILIRUBINUR NEGATIVE  KETONESUR NEGATIVE  PROTEINUR NEGATIVE  NITRITE NEGATIVE  LEUKOCYTESUR TRACE*      Imaging: No results found.   Medications:    sodium chloride 60 mL/hr at 10/31/22 1014    diltiazem  120 mg Oral Daily   fenofibrate  160 mg Oral Daily   gabapentin  100 mg Oral BID   heparin injection (subcutaneous)  5,000 Units Subcutaneous Q8H   insulin aspart  0-15 Units Subcutaneous TID WC   loratadine  10 mg Oral Daily   pantoprazole  40 mg Oral BID   sertraline  100 mg Oral Daily   sodium chloride flush  3 mL Intravenous Q12H   acetaminophen **OR** acetaminophen, HYDROcodone-acetaminophen, HYDROmorphone (DILAUDID) injection, ondansetron **OR** ondansetron (ZOFRAN) IV, polyethylene glycol  Assessment/ Plan:  Rhonda Tyler is a 82 y.o.  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, coronary artery disease, diabetes mellitus type II, GERD, hypertension, diabetic neuropathy, and sleep apnea who presents to [facility] on 10/29/2022 for Hyponatremia [E87.1] Injury of head, initial encounter [S09.90XA] Fall, initial encounter [W19.XXXA] Closed displaced fracture of left clavicle, unspecified part of clavicle, initial encounter [S42.002A] Closed fracture of proximal end of left humerus, unspecified fracture morphology, initial encounter [S42.202A] Ground-level fall [W18.30XA] Acute-on-chronic kidney injury (HCC) [N17.9, N18.9]  Acute Kidney Injury on chronic  kidney disease stage IIIB: baseline creatinine of 1.21, GFR of 46 on 09/21/22. Chronic kidney disease secondary to hypertension and diabetes. Acute kidney injury secondary to IV contrast exposure.  - holding lisinopril and spironolactone.  - no acute indication for dialysis.  - Check renal ultrasound - placed on IV normal saline at 86mL/hr  Hyponatremia: serum sodium of 125.  - Normal saline infusion as above.   Hypertension with chronic kidney disease: home regimen of lisinopril, propranolol and diltiazem. Hypotension on this admission - holding lisinopril - remains on diltiazem and propranolol.   Diabetes  mellitus type II with chronic kidney disease: insulin dependent.    LOS: 0 Rhonda Tyler 6/22/20241:56 PM

## 2022-10-31 NOTE — Progress Notes (Signed)
She got up to the Bedside Commode and urinated 200 mL. post void residual is 94 mL per bladder scan  MD aware.  Cornell Barman Ghada Abbett

## 2022-10-31 NOTE — Progress Notes (Signed)
Progress Note   Patient: Rhonda Tyler ZOX:[id number] DOB: 1940/12/16 DOA: 10/29/2022     0 DOS: the patient was seen and examined on 10/31/2022   Brief hospital course: 82 y.o. female with medical history significant of cirrhosis 2/2 NASH, CKD stage IIIb, type 2 diabetes, hypertension, hypertension, hyperlipidemia, CAD, sleep apnea, who presents to the ED due to a ground-level fall.   Rhonda Tyler states she was in her usual state of health this morning when her glucose monitor started beeping that her sugar was low but she cannot recall what it was.  She started walking to her laundry room to get some clothing before going to the kitchen.  She turned and believes she may have lost her footing falling down onto her knees and then landing onto her left shoulder.  She states that she did try to catch herself but was unable to she did end up hitting the back of her head as well but denies any loss of consciousness.  Prior to the fall, she is uncertain if she was experiencing any dizziness but does not believe so.  She denies any palpitations prior to the fall but notes that every once in a while, she has been experiencing palpitations over the last several months.   6/21.  Physical therapy recommending rehab.  Patient has left arm immobilized with left proximal humerus fracture. 6/22.  Sodium dropped down to 125 and creatinine up to 2.75.  Bladder scan showed 275 mL.  Fluid bolus today and IV fluids.  Nephrology consultation.  Assessment and Plan: * Acute kidney injury superimposed on CKD (HCC) AKI on CKD stage IIIb.  Creatinine was 1.36 on presentation and today up to 2.75.  Patient was on IV fluids since yesterday.  Will give an IV fluid bolus and nephrology consultation.  Bladder scan only showed 275 earlier.  Humerus fracture CT imaging demonstrates an impacted fracture of the left proximal humerus.  Orthopedic surgery recommending immobilization as treatment.  Continue arm sling.  Patient received  IV and oral pain medications.  Ground-level fall Physical therapy recommending rehab  Hyponatremia Sodium 125.  May worsen with IV fluids.  Leukocytosis Likely reactive from fall  Chronic diastolic CHF (congestive heart failure) (HCC) With creatinine rise hold diuretics, lisinopril and spironolactone.  Type 2 diabetes mellitus with diabetic chronic kidney disease (HCC) - Will add low-dose long-acting insulin at night. - SSI, sensitive  OSA on CPAP - CPAP at bedtime  CAD (coronary artery disease), autologous vein bypass graft Hold propranolol  Essential hypertension Hold spironolactone and lisinopril with creatinine elevation.  Holding propranolol also.        Subjective: Patient complains of a lot of pain in her left shoulder.  Patient's creatinine went up to 2.75.  Patient's blood pressure on the lower side.  Patient encouraged to eat and drink.  Physical Exam: Vitals:   10/30/22 1512 10/30/22 1948 10/31/22 0417 10/31/22 0821  BP: (!) 110/44 (!) 109/42 (!) 104/44 (!) 96/42  Pulse: 76 75 68 61  Resp: 19 18 18 14   Temp: 98.5 F (36.9 C) 97.7 F (36.5 C)  98.9 F (37.2 C)  TempSrc:  Oral    SpO2: 99% 99% 100% 96%  Weight:      Height:       Physical Exam HENT:     Head: Normocephalic.     Mouth/Throat:     Pharynx: No oropharyngeal exudate.  Eyes:     General: Lids are normal.     Conjunctiva/sclera: Conjunctivae  normal.  Cardiovascular:     Rate and Rhythm: Normal rate and regular rhythm.     Heart sounds: Normal heart sounds, S1 normal and S2 normal.  Pulmonary:     Breath sounds: Examination of the right-lower field reveals decreased breath sounds. Examination of the left-lower field reveals decreased breath sounds. Decreased breath sounds present. No wheezing, rhonchi or rales.  Abdominal:     Palpations: Abdomen is soft.     Tenderness: There is no abdominal tenderness.  Musculoskeletal:     Right lower leg: Swelling present.     Left lower leg:  Swelling present.  Skin:    General: Skin is warm.     Findings: No rash.     Comments: Patient sitting in the chair unable to look at her backside at this time  Neurological:     Mental Status: She is alert and oriented to person, place, and time.     Data Reviewed: Sodium 125, creatinine 2.75, glucose 307, hemoglobin 8.8  Family Communication:   Disposition: Status is: Inpatient Remains inpatient appropriate because: Changed to inpatient with creatinine rise.  Will give a fluid bolus.  Hold propranolol.  Planned Discharge Destination: Rehab    Time spent: 28 minutes  Author: Alford Highland, MD 10/31/2022 2:55 PM  For on call review [email or web address].

## 2022-10-31 NOTE — Progress Notes (Signed)
Physical Therapy Treatment Patient Details Name: Rhonda Tyler MRN: [id number] DOB: [birth year] Today's Date: 10/31/2022   History of Present Illness Pt is a 82 y.o. female with PMH consisting of Cirrhosis 2/2 NASH, CKD stage IIIb, type 2 diabetes, HTN, HDL, CAD, sleep apnea. Pt admitted to ED 6/20 after falling at home and reported hitting head/neck, unsure as to cause of fall. CT imaging demonstrates impacted fx of left proximal humerus. MD assessment includes hyponatremia with non-operative management of L humerus fx.    PT Comments    The pt continues to present with pain as the greatest limiting factor. She is able to tolerate transfer to bedside chair for breakfast, but requires extended time d/t pain. Pain medication was provided during session. Would recommend pre-medicating the patient prior to mobility to attempt to further progress mobility.     Recommendations for follow up therapy are one component of a multi-disciplinary discharge planning process, led by the attending physician.  Recommendations may be updated based on patient status, additional functional criteria and insurance authorization.  Follow Up Recommendations  Can patient physically be transported by private vehicle: No    Assistance Recommended at Discharge Intermittent Supervision/Assistance  Patient can return home with the following A little help with walking and/or transfers;A little help with bathing/dressing/bathroom;Assistance with cooking/housework;Assist for transportation;Help with stairs or ramp for entrance   Equipment Recommendations  Other (comment)    Recommendations for Other Services       Precautions / Restrictions Precautions Precautions: Fall Required Braces or Orthoses: Sling Restrictions Weight Bearing Restrictions: Yes LUE Weight Bearing: Non weight bearing     Mobility  Bed Mobility Overal bed mobility: Needs Assistance Bed Mobility: Sit to Supine     Supine to sit: Min  assist, HOB elevated          Transfers Overall transfer level: Independent Equipment used: 1 person hand held assist Transfers: Sit to/from Stand, Bed to chair/wheelchair/BSC Sit to Stand: Min assist   Step pivot transfers: Min assist            Ambulation/Gait                   Stairs             Wheelchair Mobility    Modified Rankin (Stroke Patients Only)       Balance Overall balance assessment: Needs assistance Sitting-balance support: No upper extremity supported, Feet unsupported Sitting balance-Leahy Scale: Good     Standing balance support: During functional activity, Single extremity supported Standing balance-Leahy Scale: Good Standing balance comment: No dizziness reported this session.                            Cognition Arousal/Alertness: Awake/alert Behavior During Therapy: WFL for tasks assessed/performed Overall Cognitive Status: Within Functional Limits for tasks assessed                                          Exercises      General Comments        Pertinent Vitals/Pain Pain Assessment Pain Assessment: 0-10 Pain Score: 10-Worst pain ever Pain Location: L arm/shoulder Pain Descriptors / Indicators: Guarding, Grimacing Pain Intervention(s): RN gave pain meds during session    Home Living  Prior Function            PT Goals (current goals can now be found in the care plan section) Acute Rehab PT Goals Patient Stated Goal: Walker farther/better, increase strength PT Goal Formulation: With patient Time For Goal Achievement: 11/12/22 Potential to Achieve Goals: Good Progress towards PT goals: Progressing toward goals    Frequency    7X/week      PT Plan Current plan remains appropriate    Co-evaluation              AM-PAC PT "6 Clicks" Mobility   Outcome Measure  Help needed turning from your back to your side while in a flat  bed without using bedrails?: A Little Help needed moving from lying on your back to sitting on the side of a flat bed without using bedrails?: A Lot Help needed moving to and from a bed to a chair (including a wheelchair)?: A Little Help needed standing up from a chair using your arms (e.g., wheelchair or bedside chair)?: A Little Help needed to walk in hospital room?: A Little Help needed climbing 3-5 steps with a railing? : A Little 6 Click Score: 17    End of Session   Activity Tolerance: Patient limited by pain Patient left: in chair;with call bell/phone within reach;with chair alarm set;with nursing/sitter in room Nurse Communication: Weight bearing status;Mobility status PT Visit Diagnosis: Repeated falls (R29.6);History of falling (Z91.81);Pain;Difficulty in walking, not elsewhere classified (R26.2);Muscle weakness (generalized) (M62.81) Pain - Right/Left: Left Pain - part of body: Arm;Shoulder     Time: 1308-6578 PT Time Calculation (min) (ACUTE ONLY): 24 min  Charges:  $Therapeutic Activity: 23-37 mins                     10:20 AM, 10/31/22 Jamerson Vonbargen A. Mordecai Maes PT, DPT Physical Therapist - [facility] A Shatana Saxton 10/31/2022, 10:19 AM

## 2022-11-01 DIAGNOSIS — S42002A Fracture of unspecified part of left clavicle, initial encounter for closed fracture: Secondary | ICD-10-CM

## 2022-11-01 DIAGNOSIS — N179 Acute kidney failure, unspecified: Secondary | ICD-10-CM | POA: Diagnosis not present

## 2022-11-01 DIAGNOSIS — W1830XA Fall on same level, unspecified, initial encounter: Secondary | ICD-10-CM | POA: Diagnosis not present

## 2022-11-01 DIAGNOSIS — E871 Hypo-osmolality and hyponatremia: Secondary | ICD-10-CM | POA: Diagnosis not present

## 2022-11-01 DIAGNOSIS — D508 Other iron deficiency anemias: Secondary | ICD-10-CM

## 2022-11-01 LAB — CBC
HCT: 26.4 % — ABNORMAL LOW (ref 36.0–46.0)
Hemoglobin: 8.4 g/dL — ABNORMAL LOW (ref 12.0–15.0)
MCH: 27.3 pg (ref 26.0–34.0)
MCHC: 31.8 g/dL (ref 30.0–36.0)
MCV: 85.7 fL (ref 80.0–100.0)
Platelets: 159 10*3/uL (ref 150–400)
RBC: 3.08 MIL/uL — ABNORMAL LOW (ref 3.87–5.11)
RDW: 14.7 % (ref 11.5–15.5)
WBC: 6.2 10*3/uL (ref 4.0–10.5)
nRBC: 0 % (ref 0.0–0.2)

## 2022-11-01 LAB — BASIC METABOLIC PANEL
Anion gap: 10 (ref 5–15)
BUN: 73 mg/dL — ABNORMAL HIGH (ref 8–23)
CO2: 21 mmol/L — ABNORMAL LOW (ref 22–32)
Calcium: 8.2 mg/dL — ABNORMAL LOW (ref 8.9–10.3)
Chloride: 93 mmol/L — ABNORMAL LOW (ref 98–111)
Creatinine, Ser: 4.01 mg/dL — ABNORMAL HIGH (ref 0.44–1.00)
GFR, Estimated: 11 mL/min — ABNORMAL LOW (ref >60)
Glucose, Bld: 331 mg/dL — ABNORMAL HIGH (ref 70–99)
Potassium: 4.7 mmol/L (ref 3.5–5.1)
Sodium: 124 mmol/L — ABNORMAL LOW (ref 135–145)

## 2022-11-01 LAB — GLUCOSE, CAPILLARY
Glucose-Capillary: 313 mg/dL — ABNORMAL HIGH (ref 70–99)
Glucose-Capillary: 333 mg/dL — ABNORMAL HIGH (ref 70–99)
Glucose-Capillary: 243 mg/dL — ABNORMAL HIGH (ref 70–99)
Glucose-Capillary: 223 mg/dL — ABNORMAL HIGH (ref 70–99)

## 2022-11-01 MED ORDER — SODIUM CHLORIDE 1 G PO TABS
1.0000 g | ORAL_TABLET | Freq: Two times a day (BID) | ORAL | Status: DC
  Administered 2022-11-01 – 2022-11-05 (×8): 1 g via ORAL
  Filled 2022-11-01 (×8): qty 1

## 2022-11-01 MED ORDER — ACETAMINOPHEN 650 MG RE SUPP: 650.0000 mg | Freq: Four times a day (QID) | RECTAL | Status: DC | PRN

## 2022-11-01 MED ORDER — HYDROCODONE-ACETAMINOPHEN 5-325 MG PO TABS
1.0000 | ORAL_TABLET | Freq: Four times a day (QID) | ORAL | Status: DC | PRN
  Administered 2022-11-02 – 2022-11-05 (×9): 1 via ORAL
  Filled 2022-11-01 (×10): qty 1

## 2022-11-01 MED ORDER — SODIUM CHLORIDE 0.9 % IV BOLUS
500.0000 mL | Freq: Once | INTRAVENOUS | Status: AC
  Administered 2022-11-01: 500 mL via INTRAVENOUS

## 2022-11-01 MED ORDER — ACETAMINOPHEN 325 MG PO TABS
650.0000 mg | ORAL_TABLET | Freq: Four times a day (QID) | ORAL | Status: DC | PRN
  Administered 2022-11-01 – 2022-11-05 (×3): 650 mg via ORAL
  Filled 2022-11-01 (×3): qty 2

## 2022-11-01 NOTE — TOC Progression Note (Signed)
Transition of Care [facility]) - Progression Note    Patient Details  Name: Rhonda Tyler MRN: [id number] Date of Birth: 1940-10-15  [facility]) CM/SW Contact  Kemper Durie, RN Phone Number: 11/01/2022, 4:40 PM  Clinical Narrative:     Patient agrees to SNF for rehab, prefers a private room at [facility].  Does not have a second preference.  FL2 done. Bed search initiated.    Expected Discharge Plan: Home w Home Health Services Barriers to Discharge: Continued Medical Work up  Expected Discharge Plan and Services       Living arrangements for the past 2 months: Single Family Home (Has an aid come in x3 a week)                           HH Arranged: Charity fundraiser, PT HH Agency: [facility] Date HH Agency Contacted: 10/30/22 Time HH Agency Contacted: 1442 Representative spoke with at [facility] Agency: Feliberto Gottron of [facility]   Social Determinants of Health (SDOH) Interventions SDOH Screenings   Food Insecurity: No Food Insecurity (09/13/2022)  Housing: Low Risk  (09/13/2022)  Transportation Needs: No Transportation Needs (09/13/2022)  Utilities: Not At Risk (09/13/2022)  Tobacco Use: Medium Risk (10/29/2022)    Readmission Risk Interventions    09/15/2022   10:21 AM  Readmission Risk Prevention Plan  Transportation Screening Complete  PCP or Specialist Appt within 3-5 Days Complete  HRI or Home Care Consult Complete  Social Work Consult for Recovery Care Planning/Counseling Complete  Palliative Care Screening Complete  Medication Review Oceanographer) Complete

## 2022-11-01 NOTE — Progress Notes (Signed)
Patient transferred to [facility] and voided 100 cc. Bladder scan after showed zero volume in the bladder. Will continue to monitor output.

## 2022-11-01 NOTE — Assessment & Plan Note (Signed)
Hemoglobin drifting down to 8.5 with IV fluid hydration.  Continue to monitor.

## 2022-11-01 NOTE — Progress Notes (Signed)
Progress Note   Patient: Rhonda Tyler AOZ:[id number] DOB: 09/06/1940 DOA: 10/29/2022     1 DOS: the patient was seen and examined on 11/01/2022   Brief hospital course: 82 y.o. female with medical history significant of cirrhosis 2/2 NASH, CKD stage IIIb, type 2 diabetes, hypertension, hypertension, hyperlipidemia, CAD, sleep apnea, who presents to the ED due to a ground-level fall.   Mrs. Schmutz states she was in her usual state of health this morning when her glucose monitor started beeping that her sugar was low but she cannot recall what it was.  She started walking to her laundry room to get some clothing before going to the kitchen.  She turned and believes she may have lost her footing falling down onto her knees and then landing onto her left shoulder.  She states that she did try to catch herself but was unable to she did end up hitting the back of her head as well but denies any loss of consciousness.  Prior to the fall, she is uncertain if she was experiencing any dizziness but does not believe so.  She denies any palpitations prior to the fall but notes that every once in a while, she has been experiencing palpitations over the last several months.   6/21.  Physical therapy recommending rehab.  Patient has left arm immobilized with left proximal humerus fracture. 6/22.  Sodium dropped down to 125 and creatinine up to 2.75.  Bladder scan showed 275 mL.  Fluid bolus today and IV fluids.  Nephrology consultation. 6/23.  Creatinine rising again today up to 4.01 with a GFR of 11.  Continue IV fluid hydration.  Salt tabs but started for hyponatremia.  Assessment and Plan: * Acute kidney injury superimposed on CKD (HCC) AKI on CKD stage IIIb.  Creatinine was 1.36 on presentation and today up to 4.01.  Patient did have a CAT scan with contrast on 6/20.  This is likely the etiology of her acute kidney injury.  Humerus fracture CT imaging demonstrates an impacted fracture of the left proximal  humerus.  Orthopedic surgery recommending immobilization as treatment.  Continue arm sling.  Patient received IV and oral pain medications.  Ground-level fall Physical therapy recommending rehab  Hyponatremia Sodium 124.  Continue IV fluids and salt tablets started by nephrology.  Leukocytosis Likely reactive from fall  Chronic diastolic CHF (congestive heart failure) (HCC) With creatinine rise hold diuretics, lisinopril and spironolactone.  Type 2 diabetes mellitus with diabetic chronic kidney disease (HCC) - Will add low-dose long-acting insulin at night. - SSI, sensitive  OSA on CPAP - CPAP at bedtime  CAD (coronary artery disease), autologous vein bypass graft Hold propranolol  Essential hypertension Hold spironolactone and lisinopril with creatinine elevation.  Holding propranolol and Cardizem this morning.  Iron deficiency anemia Hemoglobin drifting down to 8.4 with IV fluid hydration.  Continue to monitor.        Subjective: Patient still having some pain.  Creatinine still rising up to 4.01 and sodium down to 124.  Patient states her appetite is a little poor.  Blood pressure still on the lower side.  Initially admitted with humeral fracture.  Physical Exam: Vitals:   10/31/22 1532 10/31/22 1924 11/01/22 0520 11/01/22 0814  BP: (!) 101/44 (!) 109/47 (!) 111/53 (!) 101/42  Pulse: 63 60 63 63  Resp: 16 18 18 16   Temp: 97.7 F (36.5 C) 97.9 F (36.6 C) 97.6 F (36.4 C) 99.4 F (37.4 C)  TempSrc:  Oral  Oral  SpO2: 99% 97% 94% 95%  Weight:      Height:       Physical Exam HENT:     Head: Normocephalic.     Mouth/Throat:     Pharynx: No oropharyngeal exudate.  Eyes:     General: Lids are normal.     Conjunctiva/sclera: Conjunctivae normal.  Cardiovascular:     Rate and Rhythm: Normal rate and regular rhythm.     Heart sounds: Normal heart sounds, S1 normal and S2 normal.  Pulmonary:     Breath sounds: Examination of the right-lower field reveals  decreased breath sounds. Examination of the left-lower field reveals decreased breath sounds. Decreased breath sounds present. No wheezing, rhonchi or rales.  Abdominal:     Palpations: Abdomen is soft.     Tenderness: There is no abdominal tenderness.  Musculoskeletal:     Right lower leg: Swelling present.     Left lower leg: Swelling present.  Skin:    General: Skin is warm.     Findings: No rash.  Neurological:     Mental Status: She is alert and oriented to person, place, and time.     Data Reviewed: Sodium 124, creatinine 4.01, hemoglobin 8.4  Family Communication: Spoke with friend Geraldine Contras  Disposition: Status is: Inpatient Remains inpatient appropriate because: Acute kidney injury with creatinine rise.  Also watch hemoglobin.  Planned Discharge Destination: Rehab    Time spent: 28 minutes Case discussed with nephrology. Author: Alford Highland, MD 11/01/2022 1:06 PM  For on call review [email or web address].

## 2022-11-01 NOTE — Progress Notes (Signed)
Pt transferred to [facility] and only able to urinate 50cc. Bladder scan unable to pick up only volume in bladder. While in room with pt it was noted that she was seeing and hearing a little boy. Pt asked numerous times what happened to the boy that brought her to the hospital. Pt remained able to answer orientation questions and even able to admit that she thought she was seeing a boy who really wasn't there. Above information provided to MD. Orders received for bolus and to recheck bladder scan after bolus finished. Adjustments made to pain regiment as well, see orders.

## 2022-11-01 NOTE — NC FL2 (Signed)
[place] MEDICAID FL2 LEVEL OF CARE FORM     IDENTIFICATION  Patient Name: Rhonda Tyler Birthdate: 10-14-1940 Sex: female Admission Date (Current Location): 10/29/2022  [facility] and [place] Number:  Chiropodist and Address:  [facility], [address], [place], [place] [address]      Provider Number: [id number]  Attending Physician Name and Address:  Alford Highland, MD  Relative Name and Phone Number:  Suheyla, Mortellaro (Sister) [phone] [facility])    Current Level of Care: Hospital Recommended Level of Care: Skilled Nursing Facility Prior Approval Number:    Date Approved/Denied:   PASRR Number: [id number]  Discharge Plan: SNF    Current Diagnoses: Patient Active Problem List   Diagnosis Date Noted   Closed displaced fracture of left clavicle 11/01/2022   Chronic diastolic CHF (congestive heart failure) (HCC) 10/30/2022   Ground-level fall 10/29/2022   Humerus fracture 10/29/2022   Leukocytosis 10/29/2022   Pneumonia 09/13/2022   Elevated CK 09/13/2022   Generalized weakness 09/13/2022   Hyponatremia 10/14/2021   Hyperlipemia    Type II diabetes mellitus with renal manifestations (HCC)    SVT (supraventricular tachycardia)    Elevated troponin    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   Periprosthetic fracture around internal prosthetic left knee joint 05/05/2021   Idiopathic gout, unspecified site 11/20/2020   Itching 11/20/2020   Anemia in chronic kidney disease 10/16/2020   Hyperparathyroidism due to renal insufficiency (HCC) 10/16/2020   Proteinuria, unspecified 10/16/2020   Chronic kidney disease, stage 4 (severe) (HCC) 06/03/2020   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) 01/22/2020   Arthritis of left shoulder region 01/06/2020   Iron deficiency 01/06/2020   Chest pain 01/05/2020   Acute kidney injury superimposed on CKD [facility])    Essential hypertension    OSA on CPAP 09/05/2019    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   Hypercalcemia 02/13/2019   Iron deficiency anemia 06/30/2018   Acute respiratory failure with hypoxia (HCC) 03/23/2018   Hyperlipidemia due to type 2 diabetes mellitus (HCC) 04/09/2014   Type 2 diabetes mellitus with diabetic chronic kidney disease (HCC) 03/04/2014   Adrenal nodule (HCC) 11/28/2013   B12 deficiency 11/28/2013   CAD (coronary artery disease), autologous vein bypass graft 08/05/2013   DDD (degenerative disc disease), lumbosacral 08/05/2013   Headache 08/05/2013    Orientation RESPIRATION BLADDER Height & Weight     Self, Time, Situation, Place  Normal Continent Weight: 71.7 kg Height:  5\' 3"  (160 cm)  BEHAVIORAL SYMPTOMS/MOOD NEUROLOGICAL BOWEL NUTRITION STATUS      Continent Diet  AMBULATORY STATUS COMMUNICATION OF NEEDS Skin   Extensive Assist Verbally Other (Comment) (sacral blisters)                       Personal Care Assistance Level of Assistance  Bathing, Dressing Bathing Assistance: Limited assistance   Dressing Assistance: Limited assistance     Functional Limitations Info             SPECIAL CARE FACTORS FREQUENCY  PT (By licensed PT), OT (By licensed OT)     PT Frequency: 5 times a week OT Frequency: 5 times a week            Contractures Contractures Info: Not present    Additional Factors Info  Code Status, Allergies Code Status Info: full Allergies Info: Levaquin (levofloxacin In D5w) High  Shortness Of Breath  Penicillins High  Hives  Codeine Not Specified  Other (See  Comments) Hallucinate Lipitor (atorvastatin Calcium) Not Specified  Other (See Comments) Muscle pain Lopressor (metoprolol Tartrate) Not Specified  Other (See Comments) Heart races Potassium-containing Compounds Not Specified  Other (See Comments)  Facial flushing Procardia (nifedipine) Not Specified  Other (See Comments) Heart races Sucralfate Not Specified  Nausea Only  Tramadol Not Specified  Other (See Comments) Confusion  Allegra (fexofenadine) Low  Rash  Naltrexone Low  Other (See Comments) Severe headache, nausea, "body flashes". Sulfa Antibiotics           Current Medications (11/01/2022):  This is the current hospital active medication list Current Facility-Administered Medications  Medication Dose Route Frequency Provider Last Rate Last Admin   0.9 %  sodium chloride infusion   Intravenous Continuous Alford Highland, MD 75 mL/hr at 11/01/22 0810 New Bag at 11/01/22 0810   acetaminophen (TYLENOL) tablet 650 mg  650 mg Oral Q6H PRN Verdene Lennert, MD   650 mg at 10/31/22 1251   Or   acetaminophen (TYLENOL) suppository 650 mg  650 mg Rectal Q6H PRN Verdene Lennert, MD       diltiazem (CARDIZEM CD) 24 hr capsule 120 mg  120 mg Oral Daily Alford Highland, MD   120 mg at 10/31/22 0919   fenofibrate tablet 160 mg  160 mg Oral Daily Verdene Lennert, MD   160 mg at 11/01/22 0839   gabapentin (NEURONTIN) capsule 100 mg  100 mg Oral BID Alford Highland, MD   100 mg at 11/01/22 0820   heparin injection 5,000 Units  5,000 Units Subcutaneous Q8H Alford Highland, MD   5,000 Units at 11/01/22 1353   HYDROcodone-acetaminophen (NORCO/VICODIN) 5-325 MG per tablet 1-2 tablet  1-2 tablet Oral Q6H PRN Verdene Lennert, MD   2 tablet at 10/31/22 1738   HYDROmorphone (DILAUDID) injection 0.5 mg  0.5 mg Intravenous Q4H PRN Alford Highland, MD   0.5 mg at 11/01/22 1156   insulin aspart (novoLOG) injection 0-15 Units  0-15 Units Subcutaneous TID WC Verdene Lennert, MD   11 Units at 11/01/22 1158   insulin glargine-yfgn (SEMGLEE) injection 5 Units  5 Units Subcutaneous QHS Alford Highland, MD   5 Units at 10/31/22 2147   loratadine (CLARITIN) tablet 10 mg  10 mg Oral Daily Verdene Lennert, MD   10 mg at 11/01/22 0820   ondansetron (ZOFRAN) tablet 4 mg  4 mg Oral Q6H PRN Verdene Lennert, MD       Or   ondansetron (ZOFRAN) injection 4 mg  4 mg Intravenous Q6H PRN Verdene Lennert, MD   4 mg at 10/30/22 0758   pantoprazole  (PROTONIX) EC tablet 40 mg  40 mg Oral BID Verdene Lennert, MD   40 mg at 11/01/22 0820   polyethylene glycol (MIRALAX / GLYCOLAX) packet 17 g  17 g Oral Daily PRN Verdene Lennert, MD       sertraline (ZOLOFT) tablet 100 mg  100 mg Oral Daily Verdene Lennert, MD   100 mg at 11/01/22 0820   sodium chloride flush (NS) 0.9 % injection 3 mL  3 mL Intravenous Q12H Verdene Lennert, MD   3 mL at 11/01/22 4696   sodium chloride tablet 1 g  1 g Oral BID WC Kolluru, Threasa Heads, MD         Discharge Medications: Please see discharge summary for a list of discharge medications.  Relevant Imaging Results:  Relevant Lab Results:   Additional Information SS# [id number].  Patient requesting private room  Kemper Durie, [place]

## 2022-11-01 NOTE — Progress Notes (Signed)
[facility]  ROUNDING NOTE   Subjective:   Rhonda Tyler was admitted to [facility] on 10/29/2022 for Hyponatremia [E87.1] Injury of head, initial encounter [S09.90XA] Fall, initial encounter [W19.XXXA] Closed displaced fracture of left clavicle, unspecified part of clavicle, initial encounter [S42.002A] Closed fracture of proximal end of left humerus, unspecified fracture morphology, initial encounter [S42.202A] Ground-level fall [W18.30XA] Acute-on-chronic kidney injury (HCC) [N17.9, N18.9]  Creatinine 4.01 (2.75) Na 124 (125)  UOP 500  NS at 31mL/hr  Renal ultrasound with no obstruction but a stone was seen.   Objective:  Vital signs in last 24 hours:  Temp:  [97.6 F (36.4 C)-99.4 F (37.4 C)] 99.4 F (37.4 C) (06/23 0814) Pulse Rate:  [60-63] 63 (06/23 0814) Resp:  [16-18] 16 (06/23 0814) BP: (101-111)/(42-53) 101/42 (06/23 0814) SpO2:  [94 %-99 %] 95 % (06/23 0814) FiO2 (%):  [21 %] 21 % (06/22 2231)  Weight change:  Filed Weights   10/29/22 1128  Weight: 71.7 kg    Intake/Output: I/O last 3 completed shifts: In: 1480 [P.O.:480; I.V.:1000] Out: 500 [Urine:500]   Intake/Output this shift:  Total I/O In: 240 [P.O.:240] Out: -   Physical Exam: General: NAD, laying in bed  Head: Normocephalic, atraumatic. Moist oral mucosal membranes  Eyes: Anicteric, PERRL  Neck: Supple, trachea midline  Lungs:  Clear to auscultation  Heart: Regular rate and rhythm  Abdomen:  Soft, nontender,   Extremities:  no peripheral edema. Left upper extremity in sling  Neurologic: Nonfocal, moving all four extremities  Skin: No lesions  Access: none    Basic Metabolic Panel: Recent Labs  Lab 10/29/22 1123 10/30/22 0352 10/31/22 0356 11/01/22 0811  NA 129* 131* 125* 124*  K 4.2 4.1 4.6 4.7  CL 93* 99 93* 93*  CO2 22 20* 21* 21*  GLUCOSE 94 123* 307* 331*  BUN 34* 37* 57* 73*  CREATININE 1.36* 1.65* 2.75* 4.01*  CALCIUM 9.6 8.6* 8.1* 8.2*     Liver  Function Tests: Recent Labs  Lab 10/29/22 1123  AST 28  ALT 16  ALKPHOS 124  BILITOT 1.7*  PROT 7.1  ALBUMIN 4.0    No results for input(s): "LIPASE", "AMYLASE" in the last 168 hours. No results for input(s): "AMMONIA" in the last 168 hours.  CBC: Recent Labs  Lab 10/29/22 1123 10/30/22 0352 10/31/22 0356 11/01/22 0811  WBC 10.8* 11.9* 9.5 6.2  HGB 12.3 10.0* 8.8* 8.4*  HCT 37.2 31.5* 28.0* 26.4*  MCV 82.9 87.3 86.7 85.7  PLT 207 192 161 159     Cardiac Enzymes: No results for input(s): "CKTOTAL", "CKMB", "CKMBINDEX", "TROPONINI" in the last 168 hours.  BNP: Invalid input(s): "POCBNP"  CBG: Recent Labs  Lab 10/31/22 0820 10/31/22 1221 10/31/22 1700 10/31/22 2152 11/01/22 0740  GLUCAP 289* 268* 293* 283* 313*     Microbiology: Results for orders placed or performed during the hospital encounter of 09/13/22  Urine Culture     Status: Abnormal   Collection Time: 09/13/22  4:10 PM   Specimen: Urine, Random  Result Value Ref Range Status   Specimen Description   Final    URINE, RANDOM Performed at [facility], [address], [place], [place] [address]    Special Requests   Final    NONE Reflexed from [id number] Performed at [facility], [address] [place], [place] [address]    Culture >=100,000 COLONIES/mL ESCHERICHIA COLI (A)  Final   Report Status 09/15/2022 FINAL  Final   Organism  ID, Bacteria ESCHERICHIA COLI (A)  Final      Susceptibility   Escherichia coli - MIC*    AMPICILLIN >=32 RESISTANT Resistant     CEFAZOLIN <=4 SENSITIVE Sensitive     CEFEPIME <=0.12 SENSITIVE Sensitive     CEFTRIAXONE <=0.25 SENSITIVE Sensitive     CIPROFLOXACIN <=0.25 SENSITIVE Sensitive     GENTAMICIN <=1 SENSITIVE Sensitive     IMIPENEM <=0.25 SENSITIVE Sensitive     NITROFURANTOIN <=16 SENSITIVE Sensitive     TRIMETH/SULFA >=320 RESISTANT Resistant     AMPICILLIN/SULBACTAM 4 SENSITIVE Sensitive     PIP/TAZO <=4 SENSITIVE Sensitive      * >=100,000 COLONIES/mL ESCHERICHIA COLI  Blood Culture (routine x 2)     Status: None   Collection Time: 09/13/22  5:54 PM   Specimen: BLOOD LEFT HAND  Result Value Ref Range Status   Specimen Description BLOOD LEFT HAND  Final   Special Requests   Final    BOTTLES DRAWN AEROBIC AND ANAEROBIC Blood Culture results may not be optimal due to an inadequate volume of blood received in culture bottles   Culture   Final    NO GROWTH 5 DAYS Performed at [facility], [address], [place], [place] [address]    Report Status 09/18/2022 FINAL  Final  Blood Culture (routine x 2)     Status: None   Collection Time: 09/13/22  5:54 PM   Specimen: BLOOD  Result Value Ref Range Status   Specimen Description BLOOD LEFT ANTECUBITAL  Final   Special Requests   Final    BOTTLES DRAWN AEROBIC AND ANAEROBIC Blood Culture adequate volume   Culture   Final    NO GROWTH 5 DAYS Performed at [facility], [address] [place], [place] [address]    Report Status 09/18/2022 FINAL  Final  SARS Coronavirus 2 by RT PCR (hospital order, performed in [facility] hospital lab) *cepheid single result test* Anterior Nasal Swab     Status: None   Collection Time: 09/14/22  2:24 PM   Specimen: Anterior Nasal Swab  Result Value Ref Range Status   SARS Coronavirus 2 by RT PCR NEGATIVE NEGATIVE Final    Comment: (NOTE) SARS-CoV-2 target nucleic acids are NOT DETECTED.  The SARS-CoV-2 RNA is generally detectable in upper and lower respiratory specimens during the acute phase of infection. The lowest concentration of SARS-CoV-2 viral copies this assay can detect is 250 copies / mL. A negative result does not preclude SARS-CoV-2 infection and should not be used as the sole basis for treatment or other patient management decisions.  A negative result may occur with improper specimen collection / handling, submission of specimen other than nasopharyngeal swab, presence of viral mutation(s) within  the areas targeted by this assay, and inadequate number of viral copies (<250 copies / mL). A negative result must be combined with clinical observations, patient history, and epidemiological information.  Fact Sheet for Patients:   RoadLapTop.co.za  Fact Sheet for Healthcare Providers: http://kim-miller.com/  This test is not yet approved or  cleared by the [place] FDA and has been authorized for detection and/or diagnosis of SARS-CoV-2 by FDA under an Emergency Use Authorization (EUA).  This EUA will remain in effect (meaning this test can be used) for the duration of the COVID-19 declaration under Section 564(b)(1) of the Act, 21 U.S.C. section 360bbb-3(b)(1), unless the authorization is terminated or revoked sooner.  Performed at [facility], [address], [place], [place] [address]  Respiratory (~20 pathogens) panel by PCR     Status: None   Collection Time: 09/17/22 11:39 PM   Specimen: Nasopharyngeal Swab; Respiratory  Result Value Ref Range Status   Adenovirus NOT DETECTED NOT DETECTED Final   Coronavirus 229E NOT DETECTED NOT DETECTED Final    Comment: (NOTE) The Coronavirus on the Respiratory Panel, DOES NOT test for the novel  Coronavirus (2019 nCoV)    Coronavirus HKU1 NOT DETECTED NOT DETECTED Final   Coronavirus NL63 NOT DETECTED NOT DETECTED Final   Coronavirus OC43 NOT DETECTED NOT DETECTED Final   Metapneumovirus NOT DETECTED NOT DETECTED Final   Rhinovirus / Enterovirus NOT DETECTED NOT DETECTED Final   Influenza A NOT DETECTED NOT DETECTED Final   Influenza B NOT DETECTED NOT DETECTED Final   Parainfluenza Virus 1 NOT DETECTED NOT DETECTED Final   Parainfluenza Virus 2 NOT DETECTED NOT DETECTED Final   Parainfluenza Virus 3 NOT DETECTED NOT DETECTED Final   Parainfluenza Virus 4 NOT DETECTED NOT DETECTED Final   Respiratory Syncytial Virus NOT DETECTED NOT DETECTED Final   Bordetella  pertussis NOT DETECTED NOT DETECTED Final   Bordetella Parapertussis NOT DETECTED NOT DETECTED Final   Chlamydophila pneumoniae NOT DETECTED NOT DETECTED Final   Mycoplasma pneumoniae NOT DETECTED NOT DETECTED Final    Comment: Performed at [facility], [address] [place], [place] [address]    Coagulation Studies: No results for input(s): "LABPROT", "INR" in the last 72 hours.  Urinalysis: Recent Labs    10/30/22 0726  COLORURINE YELLOW*  LABSPEC 1.030  PHURINE 5.0  GLUCOSEU NEGATIVE  HGBUR NEGATIVE  BILIRUBINUR NEGATIVE  KETONESUR NEGATIVE  PROTEINUR NEGATIVE  NITRITE NEGATIVE  LEUKOCYTESUR TRACE*       Imaging: US RENAL  Result Date: 10/31/2022 CLINICAL DATA:  Acute on chronic renal insufficiency EXAM: RENAL / URINARY TRACT ULTRASOUND COMPLETE COMPARISON:  10/29/2022, 04/23/2022 FINDINGS: Right Kidney: Renal measurements: 10.1 x 3.9 x 4.0 cm = volume: 81.1 mL. Echogenicity within normal limits. No mass or hydronephrosis visualized. Left Kidney: Renal measurements: 9.6 x 4.4 by 4.7 cm = volume: 104.1 mL. Echogenicity within normal limits. No mass or hydronephrosis visualized. 6 mm echogenic focus within the mid left kidney could reflect a non radiopaque calculus, as no specific abnormality was seen in this region on recent CT. Bladder: Appears normal for degree of bladder distention. Other: None. IMPRESSION: 1. Suspected 6 mm nonobstructing left renal calculus as above. This is likely radiolucent, as no abnormality was seen in this region on recent CT. 2. Otherwise unremarkable renal ultrasound. Electronically Signed   By: Sharlet Salina M.D.   On: 10/31/2022 16:37     Medications:    sodium chloride 75 mL/hr at 11/01/22 0810    diltiazem  120 mg Oral Daily   fenofibrate  160 mg Oral Daily   gabapentin  100 mg Oral BID   heparin injection (subcutaneous)  5,000 Units Subcutaneous Q8H   insulin aspart  0-15 Units Subcutaneous TID WC   insulin glargine-yfgn  5  Units Subcutaneous QHS   loratadine  10 mg Oral Daily   pantoprazole  40 mg Oral BID   sertraline  100 mg Oral Daily   sodium chloride flush  3 mL Intravenous Q12H   sodium chloride  1 g Oral BID WC   acetaminophen **OR** acetaminophen, HYDROcodone-acetaminophen, HYDROmorphone (DILAUDID) injection, ondansetron **OR** ondansetron (ZOFRAN) IV, polyethylene glycol  Assessment/ Plan:  Rhonda Tyler is a 82 y.o.  female with hepatic cirrhosis  secondary to fatty liver, coronary artery disease, diabetes mellitus type II, GERD, hypertension, diabetic neuropathy, and sleep apnea who presents to [facility] on 10[facility]29/2022 for Hyponatremia [E87.1] Injury of head, initial encounter [S09.90XA] Fall, initial encounter [W19.XXXA] Closed displaced fracture of left clavicle, unspecified part of clavicle, initial encounter [S42.002A] Closed fracture of proximal end of left humerus, unspecified fracture morphology, initial encounter [S42.202A] Ground-level fall [W18.30XA] Acute-on-chronic kidney injury (HCC) [N17.9, N18.9]  Acute Kidney Injury on chronic kidney disease stage IIIB: baseline creatinine of 1.21, GFR of 46 on 09/21/22. Chronic kidney disease secondary to hypertension and diabetes. Acute kidney injury secondary to IV contrast exposure.  - holding lisinopril and spironolactone.  - no acute indication for dialysis.  - Continue IVF  Hyponatremia: serum sodium of 124 - Normal saline infusion as above.  - start salt tabs  Hypertension with chronic kidney disease: home regimen of lisinopril, propranolol and diltiazem. Hypotension on this admission - holding lisinopril - remains on diltiazem and propranolol.   Diabetes mellitus type II with chronic kidney disease: insulin dependent.    LOS: 1 Eisa Conaway 6/23/202411:17 AM

## 2022-11-01 NOTE — Progress Notes (Signed)
Physical Therapy Treatment Patient Details Name: Rhonda Tyler MRN: [id number] DOB: March 25, 1941 Today's Date: 11/01/2022   History of Present Illness Pt is a 82 y.o. female with PMH consisting of Cirrhosis 2/2 NASH, CKD stage IIIb, type 2 diabetes, HTN, HDL, CAD, sleep apnea. Pt admitted to ED 6/20 after falling at home and reported hitting head/neck, unsure as to cause of fall. CT imaging demonstrates impacted fx of left proximal humerus. MD assessment includes hyponatremia with non-operative management of L humerus fx.    PT Comments    Pt excited to get OOB this am.  Min  A x 1 to get EOB with increased time.  She is generally steady in sitting but does have increased pain with transition.  She is able to stand with min a  x 1 and take several small steps to recliner with min a x 1.  She is encouraged to remain standing once turned with IV pole for support and does so for about a minute before c/o nausea from increased pain.  Assisted to sitting in recliner and positioned to comfort with needs met.   Recommendations for follow up therapy are one component of a multi-disciplinary discharge planning process, led by the attending physician.  Recommendations may be updated based on patient status, additional functional criteria and insurance authorization.  Follow Up Recommendations       Assistance Recommended at Discharge Intermittent Supervision/Assistance  Patient can return home with the following A little help with walking and/or transfers;A little help with bathing/dressing/bathroom;Assistance with cooking/housework;Assist for transportation;Help with stairs or ramp for entrance   Equipment Recommendations       Recommendations for Other Services       Precautions / Restrictions Precautions Precautions: Fall Required Braces or Orthoses: Sling Restrictions Weight Bearing Restrictions: Yes LUE Weight Bearing: Non weight bearing     Mobility  Bed Mobility Overal bed mobility:  Needs Assistance Bed Mobility: Supine to Sit     Supine to sit: Min assist, HOB elevated       Patient Response: Cooperative  Transfers Overall transfer level: Needs assistance Equipment used: 1 person hand held assist Transfers: Bed to chair/wheelchair/BSC Sit to Stand: Min guard   Step pivot transfers: Min assist       General transfer comment: reports nausea with mobility    Ambulation/Gait Ambulation/Gait assistance: Min assist Gait Distance (Feet): 3 Feet Assistive device: 1 person hand held assist Gait Pattern/deviations: Step-to pattern, Decreased step length - left, Decreased step length - right, Shuffle, Trunk flexed Gait velocity: decreased     General Gait Details: small steps to chair but limited non-functional gait distances.   Stairs             Wheelchair Mobility    Modified Rankin (Stroke Patients Only)       Balance Overall balance assessment: Needs assistance Sitting-balance support: No upper extremity supported, Feet unsupported Sitting balance-Leahy Scale: Good     Standing balance support: During functional activity, Single extremity supported Standing balance-Leahy Scale: Fair Standing balance comment: HHA and +1 assist for limited mobility                            Cognition Arousal/Alertness: Awake/alert Behavior During Therapy: WFL for tasks assessed/performed Overall Cognitive Status: Within Functional Limits for tasks assessed  Exercises Other Exercises Other Exercises: static standing once turned to chair but limtied by dizziness    General Comments        Pertinent Vitals/Pain Pain Assessment Pain Assessment: Faces Faces Pain Scale: Hurts even more Pain Location: L arm/shoulder with mobility Pain Descriptors / Indicators: Guarding, Grimacing Pain Intervention(s): Limited activity within patient's tolerance, Monitored during session,  Repositioned    Home Living                          Prior Function            PT Goals (current goals can now be found in the care plan section) Progress towards PT goals: Progressing toward goals    Frequency    7X/week      PT Plan Current plan remains appropriate    Co-evaluation              AM-PAC PT "6 Clicks" Mobility   Outcome Measure  Help needed turning from your back to your side while in a flat bed without using bedrails?: A Little Help needed moving from lying on your back to sitting on the side of a flat bed without using bedrails?: A Lot Help needed moving to and from a bed to a chair (including a wheelchair)?: A Little Help needed standing up from a chair using your arms (e.g., wheelchair or bedside chair)?: A Little Help needed to walk in hospital room?: A Lot Help needed climbing 3-5 steps with a railing? : A Lot 6 Click Score: 15    End of Session   Activity Tolerance: Patient tolerated treatment well Patient left: in chair;with call bell/phone within reach;with chair alarm set;with nursing/sitter in room Nurse Communication: Weight bearing status;Mobility status PT Visit Diagnosis: Repeated falls (R29.6);History of falling (Z91.81);Pain;Difficulty in walking, not elsewhere classified (R26.2);Muscle weakness (generalized) (M62.81) Pain - Right/Left: Left Pain - part of body: Arm;Shoulder     Time: 9528-4132 PT Time Calculation (min) (ACUTE ONLY): 10 min  Charges:  $Therapeutic Activity: 8-22 mins                   Danielle Dess, PTA 11/01/22, 12:39 PM

## 2022-11-02 ENCOUNTER — Inpatient Hospital Stay: Payer: Medicare Other

## 2022-11-02 DIAGNOSIS — R253 Fasciculation: Secondary | ICD-10-CM | POA: Diagnosis not present

## 2022-11-02 DIAGNOSIS — E871 Hypo-osmolality and hyponatremia: Secondary | ICD-10-CM | POA: Diagnosis not present

## 2022-11-02 DIAGNOSIS — N179 Acute kidney failure, unspecified: Secondary | ICD-10-CM | POA: Diagnosis not present

## 2022-11-02 DIAGNOSIS — S42295D Other nondisplaced fracture of upper end of left humerus, subsequent encounter for fracture with routine healing: Secondary | ICD-10-CM | POA: Diagnosis not present

## 2022-11-02 DIAGNOSIS — G9341 Metabolic encephalopathy: Secondary | ICD-10-CM | POA: Insufficient documentation

## 2022-11-02 LAB — TYPE AND SCREEN
ABO/RH(D): A POS
Antibody Screen: NEGATIVE

## 2022-11-02 LAB — CBC
HCT: 26.6 % — ABNORMAL LOW (ref 36.0–46.0)
Hemoglobin: 8.5 g/dL — ABNORMAL LOW (ref 12.0–15.0)
MCH: 27.2 pg (ref 26.0–34.0)
MCHC: 32 g/dL (ref 30.0–36.0)
MCV: 85 fL (ref 80.0–100.0)
Platelets: 172 10*3/uL (ref 150–400)
RBC: 3.13 MIL/uL — ABNORMAL LOW (ref 3.87–5.11)
RDW: 14.6 % (ref 11.5–15.5)
WBC: 5.1 10*3/uL (ref 4.0–10.5)
nRBC: 0 % (ref 0.0–0.2)

## 2022-11-02 LAB — BASIC METABOLIC PANEL
Anion gap: 10 (ref 5–15)
BUN: 74 mg/dL — ABNORMAL HIGH (ref 8–23)
CO2: 18 mmol/L — ABNORMAL LOW (ref 22–32)
Calcium: 8.1 mg/dL — ABNORMAL LOW (ref 8.9–10.3)
Chloride: 98 mmol/L (ref 98–111)
Creatinine, Ser: 4.02 mg/dL — ABNORMAL HIGH (ref 0.44–1.00)
GFR, Estimated: 11 mL/min — ABNORMAL LOW (ref >60)
Glucose, Bld: 217 mg/dL — ABNORMAL HIGH (ref 70–99)
Potassium: 4.7 mmol/L (ref 3.5–5.1)
Sodium: 126 mmol/L — ABNORMAL LOW (ref 135–145)

## 2022-11-02 LAB — GLUCOSE, CAPILLARY
Glucose-Capillary: 213 mg/dL — ABNORMAL HIGH (ref 70–99)
Glucose-Capillary: 207 mg/dL — ABNORMAL HIGH (ref 70–99)
Glucose-Capillary: 224 mg/dL — ABNORMAL HIGH (ref 70–99)
Glucose-Capillary: 235 mg/dL — ABNORMAL HIGH (ref 70–99)

## 2022-11-02 MED ORDER — FERROUS SULFATE 325 (65 FE) MG PO TABS
325.0000 mg | ORAL_TABLET | Freq: Every day | ORAL | Status: DC
  Administered 2022-11-03 – 2022-11-05 (×3): 325 mg via ORAL
  Filled 2022-11-02 (×3): qty 1

## 2022-11-02 NOTE — TOC Progression Note (Addendum)
Transition of Care [facility]) - Progression Note    Patient Details  Name: DEZHANE STATEN MRN: [id number] Date of Birth: [birth year]  Transition of [facility] CM/SW Contact  Kreg Shropshire, RN Phone Number: 11/02/2022, 10:26 AM  Clinical Narrative:    17- Sent updated therapy notes to pending bed search. Awaiting updates from facilities.   1500- Pt agreed to [employer]. Cm reached out to [facility] admissions coordinator at [facility]. They have a private female room available. Cm will start auth.  Expected Discharge Plan: Home w Home Health Services Barriers to Discharge: Continued Medical Work up  Expected Discharge Plan and Services       Living arrangements for the past 2 months: Single Family Home (Has an aid come in x3 a week)                           HH Arranged: Charity fundraiser, PT HH Agency: [facility] Date HH Agency Contacted: 10/30/22 Time HH Agency Contacted: 1442 Representative spoke with at [facility] Agency: Feliberto Gottron of [facility]   Social Determinants of Health (SDOH) Interventions SDOH Screenings   Food Insecurity: No Food Insecurity (09/13/2022)  Housing: Low Risk  (09/13/2022)  Transportation Needs: No Transportation Needs (09/13/2022)  Utilities: Not At Risk (09/13/2022)  Tobacco Use: Medium Risk (10/29/2022)    Readmission Risk Interventions    09/15/2022   10:21 AM  Readmission Risk Prevention Plan  Transportation Screening Complete  PCP or Specialist Appt within 3-5 Days Complete  HRI or Home Care Consult Complete  Social Work Consult for Recovery Care Planning/Counseling Complete  Palliative Care Screening Complete  Medication Review [employer]) Complete

## 2022-11-02 NOTE — Progress Notes (Signed)
[facility]  ROUNDING NOTE   Subjective:   [name] was admitted to [facility] on 10/29/2022 for Hyponatremia [E87.1] Injury of head, initial encounter [S09.90XA] Fall, initial encounter [W19.XXXA] Closed displaced fracture of left clavicle, unspecified part of clavicle, initial encounter [S42.002A] Closed fracture of proximal end of left humerus, unspecified fracture morphology, initial encounter [S42.202A] Ground-level fall [W18.30XA] Acute-on-chronic kidney injury (HCC) [N17.9, N18.9]  Laying in bed No family at bedside  Creatinine 4.02 (4.01) (2.75) Na 126 (124) (125)  UOP 150  NS at 63mL/hr  Objective:  Vital signs in last 24 hours:  Temp:  [97.5 F (36.4 C)-97.9 F (36.6 C)] 97.9 F (36.6 C) (06/24 0807) Pulse Rate:  [68-74] 74 (06/24 0807) Resp:  [16-20] 18 (06/24 1118) BP: (112-119)/(41-45) 112/45 (06/24 0807) SpO2:  [97 %-99 %] 97 % (06/24 0807) FiO2 (%):  [21 %] 21 % (06/23 2221)  Weight change:  Filed Weights   10/29/22 1128  Weight: 71.7 kg    Intake/Output: I/O last 3 completed shifts: In: 3013.4 [P.O.:240; I.V.:2224.1; IV Piggyback:549.4] Out: 650 [Urine:650]   Intake/Output this shift:  Total I/O In: 120 [P.O.:120] Out: -   Physical Exam: General: NAD, laying in bed  Head: Normocephalic, atraumatic. Moist oral mucosal membranes  Eyes: Anicteric  Lungs:  Clear to auscultation  Heart: Regular rate and rhythm  Abdomen:  Soft, nontender,   Extremities:  no peripheral edema. Left upper extremity in sling  Neurologic: Nonfocal, moving all four extremities  Skin: No lesions  Access: none    Basic Metabolic Panel: Recent Labs  Lab 10/29/22 1123 10/30/22 0352 10/31/22 0356 11/01/22 0811 11/02/22 0423  NA 129* 131* 125* 124* 126*  K 4.2 4.1 4.6 4.7 4.7  CL 93* 99 93* 93* 98  CO2 22 20* 21* 21* 18*  GLUCOSE 94 123* 307* 331* 217*  BUN 34* 37* 57* 73* 74*  CREATININE 1.36* 1.65* 2.75* 4.01* 4.02*  CALCIUM 9.6 8.6* 8.1*  8.2* 8.1*     Liver Function Tests: Recent Labs  Lab 10/29/22 1123  AST 28  ALT 16  ALKPHOS 124  BILITOT 1.7*  PROT 7.1  ALBUMIN 4.0    No results for input(s): "LIPASE", "AMYLASE" in the last 168 hours. No results for input(s): "AMMONIA" in the last 168 hours.  CBC: Recent Labs  Lab 10/29/22 1123 10/30/22 0352 10/31/22 0356 11/01/22 0811 11/02/22 0423  WBC 10.8* 11.9* 9.5 6.2 5.1  HGB 12.3 10.0* 8.8* 8.4* 8.5*  HCT 37.2 31.5* 28.0* 26.4* 26.6*  MCV 82.9 87.3 86.7 85.7 85.0  PLT 207 192 161 159 172     Cardiac Enzymes: No results for input(s): "CKTOTAL", "CKMB", "CKMBINDEX", "TROPONINI" in the last 168 hours.  BNP: Invalid input(s): "POCBNP"  CBG: Recent Labs  Lab 11/01/22 0740 11/01/22 1151 11/01/22 1644 11/01/22 2106 11/02/22 0805  GLUCAP 313* 333* 243* 223* 213*     Microbiology: Results for orders placed or performed during the hospital encounter of 09/13/22  Urine Culture     Status: Abnormal   Collection Time: 09/13/22  4:10 PM   Specimen: Urine, Random  Result Value Ref Range Status   Specimen Description   Final    URINE, RANDOM Performed at [facility] [address] [place], [place] [address]    Special Requests   Final    NONE Reflexed from [id number] Performed at [facility], [address] [place], [place] [address]    Culture >=100,000 COLONIES/mL ESCHERICHIA COLI (A)  Final  Report Status 09/15/2022 FINAL  Final   Organism ID, Bacteria ESCHERICHIA COLI (A)  Final      Susceptibility   Escherichia coli - MIC*    AMPICILLIN >=32 RESISTANT Resistant     CEFAZOLIN <=4 SENSITIVE Sensitive     CEFEPIME <=0.12 SENSITIVE Sensitive     CEFTRIAXONE <=0.25 SENSITIVE Sensitive     CIPROFLOXACIN <=0.25 SENSITIVE Sensitive     GENTAMICIN <=1 SENSITIVE Sensitive     IMIPENEM <=0.25 SENSITIVE Sensitive     NITROFURANTOIN <=16 SENSITIVE Sensitive     TRIMETH/SULFA >=320 RESISTANT Resistant     AMPICILLIN/SULBACTAM 4  SENSITIVE Sensitive     PIP/TAZO <=4 SENSITIVE Sensitive     * >=100,000 COLONIES/mL ESCHERICHIA COLI  Blood Culture (routine x 2)     Status: None   Collection Time: 09/13/22  5:54 PM   Specimen: BLOOD LEFT HAND  Result Value Ref Range Status   Specimen Description BLOOD LEFT HAND  Final   Special Requests   Final    BOTTLES DRAWN AEROBIC AND ANAEROBIC Blood Culture results may not be optimal due to an inadequate volume of blood received in culture bottles   Culture   Final    NO GROWTH 5 DAYS Performed at [facility], [address], [place], [place] [address]    Report Status 09/18/2022 FINAL  Final  Blood Culture (routine x 2)     Status: None   Collection Time: 09/13/22  5:54 PM   Specimen: BLOOD  Result Value Ref Range Status   Specimen Description BLOOD LEFT ANTECUBITAL  Final   Special Requests   Final    BOTTLES DRAWN AEROBIC AND ANAEROBIC Blood Culture adequate volume   Culture   Final    NO GROWTH 5 DAYS Performed at [facility], [address] [place], [place] [address]    Report Status 09/18/2022 FINAL  Final  SARS Coronavirus 2 by RT PCR (hospital order, performed in [facility] hospital lab) *cepheid single result test* Anterior Nasal Swab     Status: None   Collection Time: 09/14/22  2:24 PM   Specimen: Anterior Nasal Swab  Result Value Ref Range Status   SARS Coronavirus 2 by RT PCR NEGATIVE NEGATIVE Final    Comment: (NOTE) SARS-CoV-2 target nucleic acids are NOT DETECTED.  The SARS-CoV-2 RNA is generally detectable in upper and lower respiratory specimens during the acute phase of infection. The lowest concentration of SARS-CoV-2 viral copies this assay can detect is 250 copies / mL. A negative result does not preclude SARS-CoV-2 infection and should not be used as the sole basis for treatment or other patient management decisions.  A negative result may occur with improper specimen collection / handling, submission of specimen  other than nasopharyngeal swab, presence of viral mutation(s) within the areas targeted by this assay, and inadequate number of viral copies (<250 copies / mL). A negative result must be combined with clinical observations, patient history, and epidemiological information.  Fact Sheet for Patients:   RoadLapTop.co.za  Fact Sheet for Healthcare Providers: http://kim-miller.com/  This test is not yet approved or  cleared by the [place] FDA and has been authorized for detection and/or diagnosis of SARS-CoV-2 by FDA under an Emergency Use Authorization (EUA).  This EUA will remain in effect (meaning this test can be used) for the duration of the COVID-19 declaration under Section 564(b)(1) of the Act, 21 U.S.C. section 360bbb-3(b)(1), unless the authorization is terminated or revoked sooner.  Performed at [facility], [address] [place], [place] [address]   Respiratory (~20 pathogens) panel by PCR     Status: None   Collection Time: 09/17/22 11:39 PM   Specimen: Nasopharyngeal Swab; Respiratory  Result Value Ref Range Status   Adenovirus NOT DETECTED NOT DETECTED Final   Coronavirus 229E NOT DETECTED NOT DETECTED Final    Comment: (NOTE) The Coronavirus on the Respiratory Panel, DOES NOT test for the novel  Coronavirus (2019 nCoV)    Coronavirus HKU1 NOT DETECTED NOT DETECTED Final   Coronavirus NL63 NOT DETECTED NOT DETECTED Final   Coronavirus OC43 NOT DETECTED NOT DETECTED Final   Metapneumovirus NOT DETECTED NOT DETECTED Final   Rhinovirus / Enterovirus NOT DETECTED NOT DETECTED Final   Influenza A NOT DETECTED NOT DETECTED Final   Influenza B NOT DETECTED NOT DETECTED Final   Parainfluenza Virus 1 NOT DETECTED NOT DETECTED Final   Parainfluenza Virus 2 NOT DETECTED NOT DETECTED Final   Parainfluenza Virus 3 NOT DETECTED NOT DETECTED Final   Parainfluenza Virus 4 NOT DETECTED NOT DETECTED Final   Respiratory  Syncytial Virus NOT DETECTED NOT DETECTED Final   Bordetella pertussis NOT DETECTED NOT DETECTED Final   Bordetella Parapertussis NOT DETECTED NOT DETECTED Final   Chlamydophila pneumoniae NOT DETECTED NOT DETECTED Final   Mycoplasma pneumoniae NOT DETECTED NOT DETECTED Final    Comment: Performed at [facility], [address] [place], [place] [address]    Coagulation Studies: No results for input(s): "LABPROT", "INR" in the last 72 hours.  Urinalysis: No results for input(s): "COLORURINE", "LABSPEC", "PHURINE", "GLUCOSEU", "HGBUR", "BILIRUBINUR", "KETONESUR", "PROTEINUR", "UROBILINOGEN", "NITRITE", "LEUKOCYTESUR" in the last 72 hours.  Invalid input(s): "APPERANCEUR"     Imaging: US RENAL  Result Date: 10/31/2022 CLINICAL DATA:  Acute on chronic renal insufficiency EXAM: RENAL / URINARY TRACT ULTRASOUND COMPLETE COMPARISON:  10/29/2022, 04/23/2022 FINDINGS: Right Kidney: Renal measurements: 10.1 x 3.9 x 4.0 cm = volume: 81.1 mL. Echogenicity within normal limits. No mass or hydronephrosis visualized. Left Kidney: Renal measurements: 9.6 x 4.4 by 4.7 cm = volume: 104.1 mL. Echogenicity within normal limits. No mass or hydronephrosis visualized. 6 mm echogenic focus within the mid left kidney could reflect a non radiopaque calculus, as no specific abnormality was seen in this region on recent CT. Bladder: Appears normal for degree of bladder distention. Other: None. IMPRESSION: 1. Suspected 6 mm nonobstructing left renal calculus as above. This is likely radiolucent, as no abnormality was seen in this region on recent CT. 2. Otherwise unremarkable renal ultrasound. Electronically Signed   By: Sharlet Salina M.D.   On: 10/31/2022 16:37     Medications:    sodium chloride 75 mL/hr at 11/02/22 4010    diltiazem  120 mg Oral Daily   fenofibrate  160 mg Oral Daily   heparin injection (subcutaneous)  5,000 Units Subcutaneous Q8H   insulin aspart  0-15 Units Subcutaneous TID WC    insulin glargine-yfgn  5 Units Subcutaneous QHS   loratadine  10 mg Oral Daily   pantoprazole  40 mg Oral BID   sertraline  100 mg Oral Daily   sodium chloride flush  3 mL Intravenous Q12H   sodium chloride  1 g Oral BID WC   acetaminophen **OR** acetaminophen, HYDROcodone-acetaminophen, ondansetron **OR** ondansetron (ZOFRAN) IV, polyethylene glycol  Assessment/ Plan:  Rhonda Tyler is a 82 y.o.  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, coronary artery disease, diabetes mellitus type II, GERD, hypertension, diabetic neuropathy,  and sleep apnea who presents to [facility] on 10/29/2022 for Hyponatremia [E87.1] Injury of head, initial encounter [S09.90XA] Fall, initial encounter [W19.XXXA] Closed displaced fracture of left clavicle, unspecified part of clavicle, initial encounter [S42.002A] Closed fracture of proximal end of left humerus, unspecified fracture morphology, initial encounter [S42.202A] Ground-level fall [W18.30XA] Acute-on-chronic kidney injury (HCC) [N17.9, N18.9]  Acute Kidney Injury on chronic kidney disease stage IIIB: baseline creatinine of 1.21, GFR of 46 on 09/21/22. Chronic kidney disease secondary to hypertension and diabetes. Acute kidney injury secondary to IV contrast exposure.  - Creatinine appears to be plateauing - Continue holding lisinopril and spironolactone.  - no acute indication for dialysis.  - Continue IVF for additional day  Hyponatremia: serum sodium of 126 - Continue IVF -  salt tabs twice daily  Hypertension with chronic kidney disease: home regimen of lisinopril, propranolol and diltiazem. Hypotension on this admission - Blood pressure stable - holding lisinopril - remains on diltiazem and propranolol.   Diabetes mellitus type II with chronic kidney disease: insulin dependent.    LOS: 2   6/24/202411:20 AM

## 2022-11-02 NOTE — Assessment & Plan Note (Signed)
Right arm twitching likely secondary to gabapentin sticking around too along with impaired kidney function.  MRI of the brain negative.  MRI of the cervical spine shows prior hardware and mild spinal canal stenosis.

## 2022-11-02 NOTE — Inpatient Diabetes Management (Signed)
Inpatient Diabetes Program Recommendations  AACE/ADA: New Consensus Statement on Inpatient Glycemic Control (2015)  Target Ranges:  Prepandial:   less than 140 mg/dL      Peak postprandial:   less than 180 mg/dL (1-2 hours)      Critically ill patients:  140 - 180 mg/dL   Lab Results  Component Value Date   GLUCAP 213 (H) 11/02/2022   HGBA1C 6.3 (H) 09/17/2022    Review of Glycemic Control  Latest Reference Range & Units 11/01/22 07:40 11/01/22 11:51 11/01/22 16:44 11/01/22 21:06 11/02/22 08:05  Glucose-Capillary 70 - 99 mg/dL 409 (H) 811 (H) 914 (H) 223 (H) 213 (H)  (H): Data is abnormally high  Diabetes history: DM2 Outpatient Diabetes medications:  70/30 25 units BID Novolin R 0-10 units at lunch Current orders for Inpatient glycemic control:  Semglee 5 units every day Novolog 0-15 units TID  Inpatient Diabetes Program Recommendations:    Please consider increasing basal insulin:  Semglee 15 units QAM (could give additional 10 units now since she already received 5 units this morning)  Will continue to follow while inpatient.  Thank you, Dulce Sellar, MSN, CDCES Diabetes Coordinator Inpatient Diabetes Program [phone] (team pager from 8a-5p)

## 2022-11-02 NOTE — Progress Notes (Signed)
Physical Therapy Treatment Patient Details Name: Rhonda Tyler MRN: [id number] DOB: 03/30/1941 Today's Date: 11/02/2022   History of Present Illness Pt is a 82 y.o. female with PMH consisting of Cirrhosis 2/2 NASH, CKD stage IIIb, type 2 diabetes, HTN, HDL, CAD, sleep apnea. Pt admitted to ED 6/20 after falling at home and reported hitting head/neck, unsure as to cause of fall. CT imaging demonstrates impacted fx of left proximal humerus. MD assessment includes hyponatremia with non-operative management of L humerus fx.    PT Comments    Pt ready for session. She is assisted to EOB with mod a x 1 due to some increased pain today.  Steady in sitting and sling is adjusted for fit.  She stands x 2 EOB and is able to step to recliner on second attempt with close min a x 1.  Positioned in chair for comfort and RN in for pain meds as requested.   She does seem a bit unorganized today and a bit off compared to yesterday.  Has difficulty holding her cell phone and answering calls x 2 during session.  Assisted as needed.   Recommendations for follow up therapy are one component of a multi-disciplinary discharge planning process, led by the attending physician.  Recommendations may be updated based on patient status, additional functional criteria and insurance authorization.  Follow Up Recommendations       Assistance Recommended at Discharge Intermittent Supervision/Assistance  Patient can return home with the following A little help with walking and/or transfers;A little help with bathing/dressing/bathroom;Assistance with cooking/housework;Assist for transportation;Help with stairs or ramp for entrance   Equipment Recommendations       Recommendations for Other Services       Precautions / Restrictions Precautions Precautions: Fall Required Braces or Orthoses: Sling Restrictions Weight Bearing Restrictions: Yes LUE Weight Bearing: Non weight bearing     Mobility  Bed Mobility Overal  bed mobility: Needs Assistance Bed Mobility: Supine to Sit     Supine to sit: Mod assist, HOB elevated       Patient Response: Cooperative  Transfers Overall transfer level: Needs assistance Equipment used: 1 person hand held assist Transfers: Bed to chair/wheelchair/BSC Sit to Stand: Min assist   Step pivot transfers: Min assist, Mod assist       General transfer comment: able to step to bed on second standing attempt    Ambulation/Gait Ambulation/Gait assistance: Min assist, Mod assist Gait Distance (Feet): 2 Feet Assistive device: 1 person hand held assist   Gait velocity: decreased     General Gait Details: small steps to chair but limited non-functional gait distances.   Stairs             Wheelchair Mobility    Modified Rankin (Stroke Patients Only)       Balance Overall balance assessment: Needs assistance Sitting-balance support: No upper extremity supported, Feet unsupported Sitting balance-Leahy Scale: Good     Standing balance support: During functional activity, Single extremity supported Standing balance-Leahy Scale: Fair Standing balance comment: HHA and +1 assist for limited mobility                            Cognition Arousal/Alertness: Awake/alert Behavior During Therapy: WFL for tasks assessed/performed Overall Cognitive Status: Impaired/Different from baseline                                 General  Comments: seemed to be a bit off today compared to yesterday.  she had difficulty answering and holding her cell phone and generally a bit disorganized today.  RN aware.        Exercises Other Exercises Other Exercises: adjusted sling in sitting.    General Comments        Pertinent Vitals/Pain Pain Assessment Pain Assessment: Faces Faces Pain Scale: Hurts whole lot Pain Location: L arm/shoulder with mobility Pain Descriptors / Indicators: Guarding, Grimacing Pain Intervention(s): Limited  activity within patient's tolerance, Monitored during session, Repositioned, Patient requesting pain meds-RN notified    Home Living                          Prior Function            PT Goals (current goals can now be found in the care plan section) Progress towards PT goals: Progressing toward goals    Frequency    7X/week      PT Plan Current plan remains appropriate    Co-evaluation              AM-PAC PT "6 Clicks" Mobility   Outcome Measure  Help needed turning from your back to your side while in a flat bed without using bedrails?: A Little Help needed moving from lying on your back to sitting on the side of a flat bed without using bedrails?: A Lot Help needed moving to and from a bed to a chair (including a wheelchair)?: A Little Help needed standing up from a chair using your arms (e.g., wheelchair or bedside chair)?: A Little Help needed to walk in hospital room?: A Lot Help needed climbing 3-5 steps with a railing? : A Lot 6 Click Score: 15    End of Session   Activity Tolerance: Patient tolerated treatment well Patient left: in chair;with call bell/phone within reach;with chair alarm set;with nursing/sitter in room Nurse Communication: Weight bearing status;Mobility status PT Visit Diagnosis: Repeated falls (R29.6);History of falling (Z91.81);Pain;Difficulty in walking, not elsewhere classified (R26.2);Muscle weakness (generalized) (M62.81) Pain - Right/Left: Left Pain - part of body: Arm;Shoulder     Time: 4098-1191 PT Time Calculation (min) (ACUTE ONLY): 16 min  Charges:  $Therapeutic Activity: 8-22 mins                   Danielle Dess, PTA 11/02/22, 10:56 AM

## 2022-11-02 NOTE — Progress Notes (Signed)
Progress Note   Patient: Rhonda Tyler UKG:[id number] DOB: May 17, 1940 DOA: 10/29/2022     2 DOS: the patient was seen and examined on 11/02/2022   Brief hospital course: 82 y.o. female with medical history significant of cirrhosis 2/2 NASH, CKD stage IIIb, type 2 diabetes, hypertension, hypertension, hyperlipidemia, CAD, sleep apnea, who presents to the ED due to a ground-level fall.   Rhonda Tyler states she was in her usual state of health this morning when her glucose monitor started beeping that her sugar was low but she cannot recall what it was.  She started walking to her laundry room to get some clothing before going to the kitchen.  She turned and believes she may have lost her footing falling down onto her knees and then landing onto her left shoulder.  She states that she did try to catch herself but was unable to she did end up hitting the back of her head as well but denies any loss of consciousness.  Prior to the fall, she is uncertain if she was experiencing any dizziness but does not believe so.  She denies any palpitations prior to the fall but notes that every once in a while, she has been experiencing palpitations over the last several months.   6/21.  Physical therapy recommending rehab.  Patient has left arm immobilized with left proximal humerus fracture. 6/22.  Sodium dropped down to 125 and creatinine up to 2.75.  Bladder scan showed 275 mL.  Fluid bolus today and IV fluids.  Nephrology consultation. 6/23.  Creatinine rising again today up to 4.01 with a GFR of 11.  Continue IV fluid hydration.  Salt tabs but started for hyponatremia. 6/24.  Creatinine seems to have plateaued at 4.02.  Continue IV fluid hydration.  Patient with poor urine output.  Right arm twitching likely secondary to gabapentin sticking around in his system too long with acute kidney injury.  Gabapentin discontinued last night.  MRI of the brain is negative.  MRI of the cervical spine showed prior hardware and  mild spinal canal stenosis.  Assessment and Plan: * Acute kidney injury superimposed on CKD (HCC) AKI on CKD stage IIIb.  Creatinine was 1.36 on presentation and today up to 4.02.  Patient did have a CAT scan with contrast on 6/20.  This is likely the etiology of her acute kidney injury.  Humerus fracture CT imaging demonstrates an impacted fracture of the left proximal humerus.  Orthopedic surgery recommending immobilization as treatment.  Continue arm sling.  Discontinued IV pain medications and lowered the dose of oral pain medications secondary to confusion last night.  Try to convert to Tylenol.  Muscle twitching Right arm twitching likely secondary to gabapentin sticking around too along with impaired kidney function.  MRI of the brain negative.  MRI of the cervical spine shows prior hardware and mild spinal canal stenosis.  Hyponatremia Sodium 126.  Continue IV fluids and salt tablets started by nephrology.  Ground-level fall Physical therapy recommending rehab  Leukocytosis Likely reactive from fall  Acute metabolic encephalopathy Likely secondary to pain medications and gabapentin.  Gabapentin and IV pain medications discontinued last night.  Decreased oral pain medications.  Try to convert to Tylenol as quickly as possible.  Mental status better today.  Chronic diastolic CHF (congestive heart failure) (HCC) With creatinine rise hold diuretics, lisinopril and spironolactone.  Type 2 diabetes mellitus with diabetic chronic kidney disease (HCC) - Will add low-dose long-acting insulin at night. - SSI, sensitive  OSA on  CPAP - CPAP at bedtime  CAD (coronary artery disease), autologous vein bypass graft Hold propranolol  Essential hypertension Hold spironolactone and lisinopril with creatinine elevation.  Holding propranolol and Cardizem this morning.  Iron deficiency anemia Hemoglobin drifting down to 8.5 with IV fluid hydration.  Continue to monitor.         Subjective: Patient complaining of right arm twitching and needs help eating since her left arm is immobilized with fracture.  Patient not urinating very much.  Admitted initially with left humerus fracture.  Then went into acute kidney injury.  Physical Exam: Vitals:   11/02/22 0807 11/02/22 0900 11/02/22 0944 11/02/22 1118  BP: (!) 112/45     Pulse: 74     Resp: 18 18 18 18   Temp: 97.9 F (36.6 C)     TempSrc:      SpO2: 97%     Weight:      Height:       Physical Exam HENT:     Head: Normocephalic.     Mouth/Throat:     Pharynx: No oropharyngeal exudate.  Eyes:     General: Lids are normal.     Conjunctiva/sclera: Conjunctivae normal.  Cardiovascular:     Rate and Rhythm: Normal rate and regular rhythm.     Heart sounds: Normal heart sounds, S1 normal and S2 normal.  Pulmonary:     Breath sounds: Examination of the right-lower field reveals decreased breath sounds. Examination of the left-lower field reveals decreased breath sounds. Decreased breath sounds present. No wheezing, rhonchi or rales.  Abdominal:     Palpations: Abdomen is soft.     Tenderness: There is no abdominal tenderness.  Musculoskeletal:     Right lower leg: Swelling present.     Left lower leg: Swelling present.  Skin:    General: Skin is warm.     Findings: No rash.  Neurological:     Mental Status: She is alert and oriented to person, place, and time.     Data Reviewed: MRI of the brain negative for acute event.  Cervical spine MRI shows previous hardware with mild spinal canal stenosis. Hemoglobin 8.5, creatinine 4.02, sodium 126  Family Communication: Spoke with patient's friend Geraldine Contras on the phone  Disposition: Status is: Inpatient Remains inpatient appropriate because: Kidney function hopefully has plateaued.  Continue IV fluids  Planned Discharge Destination: Rehab    Time spent: 28 minutes  Author: Alford Highland, MD 11/02/2022 2:53 PM  For on call review [email or web address].

## 2022-11-02 NOTE — Assessment & Plan Note (Signed)
Likely secondary to pain medications and gabapentin.  Gabapentin and IV pain medications discontinued last night.  Decreased oral pain medications.  Try to convert to Tylenol as quickly as possible.  Mental status better today.

## 2022-11-02 NOTE — TOC Progression Note (Signed)
Transition of Care [facility]) - Progression Note    Patient Details  Name: Rhonda Tyler MRN: [id number] Date of Birth: [birth year]  [facility] of [facility]) CM/SW Contact  Chapman Fitch, RN Phone Number: 11/02/2022, 2:25 PM  Clinical Narrative:     Message sent to [facility] to determine if they can offer a bed   Expected Discharge Plan: Home w Home Health Services Barriers to Discharge: Continued Medical Work up  Expected Discharge Plan and Services       Living arrangements for the past 2 months: Single Family Home (Has an aid come in x3 a week)                           HH Arranged: Charity fundraiser, PT HH Agency: [facility] Date HH Agency Contacted: 10/30/22 Time HH Agency Contacted: [phone] Representative spoke with at [facility] Agency: Feliberto Gottron of [facility]   Social Determinants of Health (SDOH) Interventions SDOH Screenings   Food Insecurity: No Food Insecurity (09/13/2022)  Housing: Low Risk  (09/13/2022)  Transportation Needs: No Transportation Needs (09/13/2022)  Utilities: Not At Risk (09/13/2022)  Tobacco Use: Medium Risk (10/29/2022)    Readmission Risk Interventions    09/15/2022   10:21 AM  Readmission Risk Prevention Plan  Transportation Screening Complete  PCP or Specialist Appt within 3-5 Days Complete  HRI or Home Care Consult Complete  Social Work Consult for Recovery Care Planning/Counseling Complete  Palliative Care Screening Complete  Medication Review [employer]) Complete

## 2022-11-02 NOTE — Progress Notes (Signed)
OT Cancellation Note  Patient Details Name: Rhonda Tyler MRN: [id number] DOB: [birth year]   Cancelled Treatment:    Reason Eval/Treat Not Completed: Other (comment). Upon attempt, nurse tech noting pt just back to bed and received a bed bath and is very fatigued. Also note new imaging pending. Will re-attempt OT tx at later date/time.   Arman Filter., MPH, MS, OTR/L ascom [phone] 11/02/22, 11:30 AM

## 2022-11-03 DIAGNOSIS — R253 Fasciculation: Secondary | ICD-10-CM | POA: Diagnosis not present

## 2022-11-03 DIAGNOSIS — E871 Hypo-osmolality and hyponatremia: Secondary | ICD-10-CM | POA: Diagnosis not present

## 2022-11-03 DIAGNOSIS — S42209S Unspecified fracture of upper end of unspecified humerus, sequela: Secondary | ICD-10-CM | POA: Diagnosis not present

## 2022-11-03 DIAGNOSIS — N179 Acute kidney failure, unspecified: Secondary | ICD-10-CM | POA: Diagnosis not present

## 2022-11-03 LAB — BASIC METABOLIC PANEL
Anion gap: 9 (ref 5–15)
BUN: 66 mg/dL — ABNORMAL HIGH (ref 8–23)
CO2: 17 mmol/L — ABNORMAL LOW (ref 22–32)
Calcium: 8.1 mg/dL — ABNORMAL LOW (ref 8.9–10.3)
Chloride: 100 mmol/L (ref 98–111)
Creatinine, Ser: 2.78 mg/dL — ABNORMAL HIGH (ref 0.44–1.00)
GFR, Estimated: 17 mL/min — ABNORMAL LOW (ref >60)
Glucose, Bld: 222 mg/dL — ABNORMAL HIGH (ref 70–99)
Potassium: 4.7 mmol/L (ref 3.5–5.1)
Sodium: 126 mmol/L — ABNORMAL LOW (ref 135–145)

## 2022-11-03 LAB — HEMOGLOBIN: Hemoglobin: 7.9 g/dL — ABNORMAL LOW (ref 12.0–15.0)

## 2022-11-03 LAB — GLUCOSE, CAPILLARY
Glucose-Capillary: 239 mg/dL — ABNORMAL HIGH (ref 70–99)
Glucose-Capillary: 256 mg/dL — ABNORMAL HIGH (ref 70–99)
Glucose-Capillary: 179 mg/dL — ABNORMAL HIGH (ref 70–99)

## 2022-11-03 NOTE — Progress Notes (Signed)
[facility]  ROUNDING NOTE   Subjective:   [name] was admitted to [facility] on 10/29/2022 for Hyponatremia [E87.1] Injury of head, initial encounter [S09.90XA] Fall, initial encounter [W19.XXXA] Closed displaced fracture of left clavicle, unspecified part of clavicle, initial encounter [S42.002A] Closed fracture of proximal end of left humerus, unspecified fracture morphology, initial encounter [S42.202A] Ground-level fall [W18.30XA] Acute-on-chronic kidney injury (HCC) [N17.9, N18.9]  Sitting up in chair Breakfast tray at bedside Tremors present at rest  Creatinine 2.78 (4.02) (4.01) (2.75) Na 126 (126( (124) (125)   NS at 59mL/hr  Objective:  Vital signs in last 24 hours:  Temp:  [97.9 F (36.6 C)-98.7 F (37.1 C)] 98.4 F (36.9 C) (06/25 0832) Pulse Rate:  [73-81] 80 (06/25 0832) Resp:  [18-20] 18 (06/25 0832) BP: (106-142)/(47-95) 121/50 (06/25 0832) SpO2:  [94 %-99 %] 99 % (06/25 0832) FiO2 (%):  [21 %] 21 % (06/24 2212)  Weight change:  Filed Weights   10/29/22 1128  Weight: 71.7 kg    Intake/Output: I/O last 3 completed shifts: In: 2668 [P.O.:600; I.V.:1518.7; IV Piggyback:549.4] Out: 100 [Urine:100]   Intake/Output this shift:  No intake/output data recorded.  Physical Exam: General: NAD  Head: Normocephalic, atraumatic. Moist oral mucosal membranes  Eyes: Anicteric  Lungs:  Clear to auscultation  Heart: Regular rate and rhythm  Abdomen:  Soft, nontender  Extremities:  no peripheral edema. Left upper extremity in sling  Neurologic: Nonfocal, moving all four extremities  Skin: No lesions  Access: none    Basic Metabolic Panel: Recent Labs  Lab 10/30/22 0352 10/31/22 0356 11/01/22 0811 11/02/22 0423 11/03/22 0819  NA 131* 125* 124* 126* 126*  K 4.1 4.6 4.7 4.7 4.7  CL 99 93* 93* 98 100  CO2 20* 21* 21* 18* 17*  GLUCOSE 123* 307* 331* 217* 222*  BUN 37* 57* 73* 74* 66*  CREATININE 1.65* 2.75* 4.01* 4.02* 2.78*   CALCIUM 8.6* 8.1* 8.2* 8.1* 8.1*     Liver Function Tests: Recent Labs  Lab 10/29/22 1123  AST 28  ALT 16  ALKPHOS 124  BILITOT 1.7*  PROT 7.1  ALBUMIN 4.0    No results for input(s): "LIPASE", "AMYLASE" in the last 168 hours. No results for input(s): "AMMONIA" in the last 168 hours.  CBC: Recent Labs  Lab 10/29/22 1123 10/30/22 0352 10/31/22 0356 11/01/22 0811 11/02/22 0423 11/03/22 0819  WBC 10.8* 11.9* 9.5 6.2 5.1  --   HGB 12.3 10.0* 8.8* 8.4* 8.5* 7.9*  HCT 37.2 31.5* 28.0* 26.4* 26.6*  --   MCV 82.9 87.3 86.7 85.7 85.0  --   PLT 207 192 161 159 172  --      Cardiac Enzymes: No results for input(s): "CKTOTAL", "CKMB", "CKMBINDEX", "TROPONINI" in the last 168 hours.  BNP: Invalid input(s): "POCBNP"  CBG: Recent Labs  Lab 11/02/22 0805 11/02/22 1130 11/02/22 1722 11/02/22 2114 11/03/22 0828  GLUCAP 213* 207* 224* 235* 239*     Microbiology: Results for orders placed or performed during the hospital encounter of 09/13/22  Urine Culture     Status: Abnormal   Collection Time: 09/13/22  4:10 PM   Specimen: Urine, Random  Result Value Ref Range Status   Specimen Description   Final    URINE, RANDOM Performed at [facility], [address], [place], [place] [address]    Special Requests   Final    NONE Reflexed from [id number] Performed at [facility], [address] [place], [place]  [address]    Culture >=100,000 COLONIES/mL ESCHERICHIA COLI (A)  Final   Report Status 09/15/2022 FINAL  Final   Organism ID, Bacteria ESCHERICHIA COLI (A)  Final      Susceptibility   Escherichia coli - MIC*    AMPICILLIN >=32 RESISTANT Resistant     CEFAZOLIN <=4 SENSITIVE Sensitive     CEFEPIME <=0.12 SENSITIVE Sensitive     CEFTRIAXONE <=0.25 SENSITIVE Sensitive     CIPROFLOXACIN <=0.25 SENSITIVE Sensitive     GENTAMICIN <=1 SENSITIVE Sensitive     IMIPENEM <=0.25 SENSITIVE Sensitive     NITROFURANTOIN <=16 SENSITIVE Sensitive      TRIMETH/SULFA >=320 RESISTANT Resistant     AMPICILLIN/SULBACTAM 4 SENSITIVE Sensitive     PIP/TAZO <=4 SENSITIVE Sensitive     * >=100,000 COLONIES/mL ESCHERICHIA COLI  Blood Culture (routine x 2)     Status: None   Collection Time: 09/13/22  5:54 PM   Specimen: BLOOD LEFT HAND  Result Value Ref Range Status   Specimen Description BLOOD LEFT HAND  Final   Special Requests   Final    BOTTLES DRAWN AEROBIC AND ANAEROBIC Blood Culture results may not be optimal due to an inadequate volume of blood received in culture bottles   Culture   Final    NO GROWTH 5 DAYS Performed at [facility], [address], [place], [place] [address]    Report Status 09/18/2022 FINAL  Final  Blood Culture (routine x 2)     Status: None   Collection Time: 09/13/22  5:54 PM   Specimen: BLOOD  Result Value Ref Range Status   Specimen Description BLOOD LEFT ANTECUBITAL  Final   Special Requests   Final    BOTTLES DRAWN AEROBIC AND ANAEROBIC Blood Culture adequate volume   Culture   Final    NO GROWTH 5 DAYS Performed at [facility], [address] [place], [place] [address]    Report Status 09/18/2022 FINAL  Final  SARS Coronavirus 2 by RT PCR (hospital order, performed in [facility] hospital lab) *cepheid single result test* Anterior Nasal Swab     Status: None   Collection Time: 09/14/22  2:24 PM   Specimen: Anterior Nasal Swab  Result Value Ref Range Status   SARS Coronavirus 2 by RT PCR NEGATIVE NEGATIVE Final    Comment: (NOTE) SARS-CoV-2 target nucleic acids are NOT DETECTED.  The SARS-CoV-2 RNA is generally detectable in upper and lower respiratory specimens during the acute phase of infection. The lowest concentration of SARS-CoV-2 viral copies this assay can detect is 250 copies / mL. A negative result does not preclude SARS-CoV-2 infection and should not be used as the sole basis for treatment or other patient management decisions.  A negative result may occur  with improper specimen collection / handling, submission of specimen other than nasopharyngeal swab, presence of viral mutation(s) within the areas targeted by this assay, and inadequate number of viral copies (<250 copies / mL). A negative result must be combined with clinical observations, patient history, and epidemiological information.  Fact Sheet for Patients:   RoadLapTop.co.za  Fact Sheet for Healthcare Providers: http://kim-miller.com/  This test is not yet approved or  cleared by the [place] FDA and has been authorized for detection and/or diagnosis of SARS-CoV-2 by FDA under an Emergency Use Authorization (EUA).  This EUA will remain in effect (meaning this test can be used) for the duration of the COVID-19 declaration under Section 564(b)(1) of the Act, 21 U.S.C.  section 360bbb-3(b)(1), unless the authorization is terminated or revoked sooner.  Performed at [facility], [address] [place], [place] [address]   Respiratory (~20 pathogens) panel by PCR     Status: None   Collection Time: 09/17/22 11:39 PM   Specimen: Nasopharyngeal Swab; Respiratory  Result Value Ref Range Status   Adenovirus NOT DETECTED NOT DETECTED Final   Coronavirus 229E NOT DETECTED NOT DETECTED Final    Comment: (NOTE) The Coronavirus on the Respiratory Panel, DOES NOT test for the novel  Coronavirus (2019 nCoV)    Coronavirus HKU1 NOT DETECTED NOT DETECTED Final   Coronavirus NL63 NOT DETECTED NOT DETECTED Final   Coronavirus OC43 NOT DETECTED NOT DETECTED Final   Metapneumovirus NOT DETECTED NOT DETECTED Final   Rhinovirus / Enterovirus NOT DETECTED NOT DETECTED Final   Influenza A NOT DETECTED NOT DETECTED Final   Influenza B NOT DETECTED NOT DETECTED Final   Parainfluenza Virus 1 NOT DETECTED NOT DETECTED Final   Parainfluenza Virus 2 NOT DETECTED NOT DETECTED Final   Parainfluenza Virus 3 NOT DETECTED NOT DETECTED Final    Parainfluenza Virus 4 NOT DETECTED NOT DETECTED Final   Respiratory Syncytial Virus NOT DETECTED NOT DETECTED Final   Bordetella pertussis NOT DETECTED NOT DETECTED Final   Bordetella Parapertussis NOT DETECTED NOT DETECTED Final   Chlamydophila pneumoniae NOT DETECTED NOT DETECTED Final   Mycoplasma pneumoniae NOT DETECTED NOT DETECTED Final    Comment: Performed at [facility], [address] [place], [place] [address]    Coagulation Studies: No results for input(s): "LABPROT", "INR" in the last 72 hours.  Urinalysis: No results for input(s): "COLORURINE", "LABSPEC", "PHURINE", "GLUCOSEU", "HGBUR", "BILIRUBINUR", "KETONESUR", "PROTEINUR", "UROBILINOGEN", "NITRITE", "LEUKOCYTESUR" in the last 72 hours.  Invalid input(s): "APPERANCEUR"     Imaging: MR BRAIN WO CONTRAST  Result Date: 11/02/2022 CLINICAL DATA:  Larey Seat and hit head and neck, right arm tremor EXAM: MRI HEAD WITHOUT CONTRAST MRI CERVICAL SPINE WITHOUT CONTRAST TECHNIQUE: Multiplanar, multiecho pulse sequences of the brain and surrounding structures, and cervical spine, to include the craniocervical junction and cervicothoracic junction, were obtained without intravenous contrast. COMPARISON:  12/18/2021 MRI head, 10/29/2022 CT head and cervical spine FINDINGS: MRI HEAD FINDINGS Brain: No restricted diffusion to suggest acute or subacute infarct. No acute hemorrhage, mass, mass effect, or midline shift. No hydrocephalus or extra-axial collection. Normal pituitary and craniocervical junction. No hemosiderin deposition to suggest remote hemorrhage. Normal cerebral volume for age. Vascular: Normal arterial flow voids. Skull and upper cervical spine: Normal marrow signal. Sinuses/Orbits: Clear paranasal sinuses. No acute finding in the orbits. Status post bilateral lens replacements. Other: The mastoid air cells are well aerated. MRI CERVICAL SPINE FINDINGS Evaluation is somewhat limited by motion artifact. Alignment: No  significant listhesis. Vertebrae: Status post C3-C7 corpectomy and anterior fusion. Susceptibility artifact from the hardware limits evaluation of these levels and the adjacent spinal canal. Within this limitation, no acute fracture, evidence of discitis, or suspicious osseous lesion. Congenitally short pedicles, which narrow the AP diameter of the spinal canal. Cord: Normal signal and morphology. No epidural collection or hematoma. Posterior Fossa, vertebral arteries, paraspinal tissues: Negative. Disc levels: C2-C3: No significant disc bulge. Mild spinal canal stenosis, primarily secondary to short pedicles. Mild left neural foraminal narrowing, although evaluation is limited by motion. C3-C4: Status post corpectomy. Likely mild spinal canal stenosis, although evaluation is limited by susceptibility and motion. No significant neural foraminal narrowing. C4-C5: Status post corpectomy. Likely mild spinal canal stenosis, although evaluation is  limited by susceptibility and motion. No significant neural foraminal narrowing. C5-C6: Status post corpectomy. Likely mild spinal canal stenosis, although evaluation is limited by susceptibility and motion. No significant neural foraminal narrowing. C6-C7: Status post corpectomy. No spinal canal stenosis. Mild bilateral neural foraminal narrowing. C7-T1: No significant disc bulge. No spinal canal stenosis or neuroforaminal narrowing. IMPRESSION: 1. No acute intracranial process. 2. Status post C3-C7 corpectomy and anterior fusion. Susceptibility artifact from the hardware limits evaluation of these levels and the adjacent spinal canal. Within this limitation, there is likely mild spinal canal stenosis at C3-C4, C4-C5, and C5-C6. 3. Mild neural foraminal narrowing on the left at C2-C3 and bilaterally at C6-C7. 4. No evidence of trauma in the cervical spine Electronically Signed   By: Wiliam Ke M.D.   On: 11/02/2022 13:44   MR CERVICAL SPINE WO CONTRAST  Result Date:  11/02/2022 CLINICAL DATA:  Larey Seat and hit head and neck, right arm tremor EXAM: MRI HEAD WITHOUT CONTRAST MRI CERVICAL SPINE WITHOUT CONTRAST TECHNIQUE: Multiplanar, multiecho pulse sequences of the brain and surrounding structures, and cervical spine, to include the craniocervical junction and cervicothoracic junction, were obtained without intravenous contrast. COMPARISON:  12/18/2021 MRI head, 10/29/2022 CT head and cervical spine FINDINGS: MRI HEAD FINDINGS Brain: No restricted diffusion to suggest acute or subacute infarct. No acute hemorrhage, mass, mass effect, or midline shift. No hydrocephalus or extra-axial collection. Normal pituitary and craniocervical junction. No hemosiderin deposition to suggest remote hemorrhage. Normal cerebral volume for age. Vascular: Normal arterial flow voids. Skull and upper cervical spine: Normal marrow signal. Sinuses/Orbits: Clear paranasal sinuses. No acute finding in the orbits. Status post bilateral lens replacements. Other: The mastoid air cells are well aerated. MRI CERVICAL SPINE FINDINGS Evaluation is somewhat limited by motion artifact. Alignment: No significant listhesis. Vertebrae: Status post C3-C7 corpectomy and anterior fusion. Susceptibility artifact from the hardware limits evaluation of these levels and the adjacent spinal canal. Within this limitation, no acute fracture, evidence of discitis, or suspicious osseous lesion. Congenitally short pedicles, which narrow the AP diameter of the spinal canal. Cord: Normal signal and morphology. No epidural collection or hematoma. Posterior Fossa, vertebral arteries, paraspinal tissues: Negative. Disc levels: C2-C3: No significant disc bulge. Mild spinal canal stenosis, primarily secondary to short pedicles. Mild left neural foraminal narrowing, although evaluation is limited by motion. C3-C4: Status post corpectomy. Likely mild spinal canal stenosis, although evaluation is limited by susceptibility and motion. No  significant neural foraminal narrowing. C4-C5: Status post corpectomy. Likely mild spinal canal stenosis, although evaluation is limited by susceptibility and motion. No significant neural foraminal narrowing. C5-C6: Status post corpectomy. Likely mild spinal canal stenosis, although evaluation is limited by susceptibility and motion. No significant neural foraminal narrowing. C6-C7: Status post corpectomy. No spinal canal stenosis. Mild bilateral neural foraminal narrowing. C7-T1: No significant disc bulge. No spinal canal stenosis or neuroforaminal narrowing. IMPRESSION: 1. No acute intracranial process. 2. Status post C3-C7 corpectomy and anterior fusion. Susceptibility artifact from the hardware limits evaluation of these levels and the adjacent spinal canal. Within this limitation, there is likely mild spinal canal stenosis at C3-C4, C4-C5, and C5-C6. 3. Mild neural foraminal narrowing on the left at C2-C3 and bilaterally at C6-C7. 4. No evidence of trauma in the cervical spine Electronically Signed   By: Wiliam Ke M.D.   On: 11/02/2022 13:44     Medications:    sodium chloride 40 mL/hr at 11/03/22 1229    diltiazem  120 mg Oral Daily   fenofibrate  160  mg Oral Daily   ferrous sulfate  325 mg Oral Q breakfast   heparin injection (subcutaneous)  5,000 Units Subcutaneous Q8H   insulin aspart  0-15 Units Subcutaneous TID WC   insulin glargine-yfgn  5 Units Subcutaneous QHS   loratadine  10 mg Oral Daily   pantoprazole  40 mg Oral BID   sertraline  100 mg Oral Daily   sodium chloride flush  3 mL Intravenous Q12H   sodium chloride  1 g Oral BID WC   acetaminophen **OR** acetaminophen, HYDROcodone-acetaminophen, ondansetron **OR** ondansetron (ZOFRAN) IV, polyethylene glycol  Assessment/ Plan:  [name] is a 82 y.o.  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, coronary artery disease, diabetes mellitus type II, GERD, hypertension, diabetic neuropathy, and sleep apnea who  presents to [facility] on 10/29/2022 for Hyponatremia [E87.1] Injury of head, initial encounter [S09.90XA] Fall, initial encounter [W19.XXXA] Closed displaced fracture of left clavicle, unspecified part of clavicle, initial encounter [S42.002A] Closed fracture of proximal end of left humerus, unspecified fracture morphology, initial encounter [S42.202A] Ground-level fall [W18.30XA] Acute-on-chronic kidney injury (HCC) [N17.9, N18.9]  Acute Kidney Injury on chronic kidney disease stage IIIB: baseline creatinine of 1.21, GFR of 46 on 09/21/22. Chronic kidney disease secondary to hypertension and diabetes. Acute kidney injury secondary to IV contrast exposure.  - Creatinine greatly improved - Continue holding lisinopril and spironolactone.  - Dialysis not indicated  - Continue IVF until oral intake improves  Hyponatremia: serum sodium remains 126 - Continue IVF -  salt tabs twice daily  Hypertension with chronic kidney disease: home regimen of lisinopril, propranolol and diltiazem. Hypotension on this admission - holding lisinopril - remains on diltiazem and propranolol.   Diabetes mellitus type II with chronic kidney disease: insulin dependent.    LOS: 3   6/25/20242:10 PM

## 2022-11-03 NOTE — Progress Notes (Signed)
Progress Note   Patient: Rhonda Tyler WGN:[id number] DOB: May 10, 1941 DOA: 10/29/2022     3 DOS: the patient was seen and examined on 11/03/2022   Brief hospital course: 82 y.o. female with medical history significant of cirrhosis 2/2 NASH, CKD stage IIIb, type 2 diabetes, hypertension, hypertension, hyperlipidemia, CAD, sleep apnea, who presents to the ED due to a ground-level fall.   Mrs. Zullo states she was in her usual state of health this morning when her glucose monitor started beeping that her sugar was low but she cannot recall what it was.  She started walking to her laundry room to get some clothing before going to the kitchen.  She turned and believes she may have lost her footing falling down onto her knees and then landing onto her left shoulder.  She states that she did try to catch herself but was unable to she did end up hitting the back of her head as well but denies any loss of consciousness.  Prior to the fall, she is uncertain if she was experiencing any dizziness but does not believe so.  She denies any palpitations prior to the fall but notes that every once in a while, she has been experiencing palpitations over the last several months.   6/21.  Physical therapy recommending rehab.  Patient has left arm immobilized with left proximal humerus fracture. 6/22.  Sodium dropped down to 125 and creatinine up to 2.75.  Bladder scan showed 275 mL.  Fluid bolus today and IV fluids.  Nephrology consultation. 6/23.  Creatinine rising again today up to 4.01 with a GFR of 11.  Continue IV fluid hydration.  Salt tabs but started for hyponatremia. 6/24.  Creatinine seems to have plateaued at 4.02.  Continue IV fluid hydration.  Patient with poor urine output.  Right arm twitching likely secondary to gabapentin sticking around in his system too long with acute kidney injury.  Gabapentin discontinued last night.  MRI of the brain is negative.  MRI of the cervical spine showed prior hardware and  mild spinal canal stenosis. 6/25.  Patient still having right arm twitching likely secondary to gabapentin.  Case discussed with neurologist who mentioned twitching should improve once creatinine down to about 1.5.  Patient's creatinine 2.78.  Assessment and Plan: * Acute kidney injury superimposed on CKD (HCC) AKI on CKD stage IIIb.  Creatinine was 1.36 on presentation and peaked at 4.02 on 6/24.  Today's creatinine 2.78.  Patient did have a CAT scan with contrast on 6/20.  This is likely the etiology of her acute kidney injury.  Humerus fracture CT imaging demonstrates an impacted fracture of the left proximal humerus.  Orthopedic surgery recommending immobilization as treatment.  Continue arm sling.  Discontinued IV pain medications and lowered the dose of oral pain medications secondary to confusion to nights ago.  Try to convert to Tylenol.  Muscle twitching Right arm twitching likely secondary to gabapentin sticking around too along with impaired kidney function.  (Ran case by neurologist who mentioned twitching should subside once creatinine down to 1.5 and gabapentin gets out of system).  MRI of the brain negative.  MRI of the cervical spine shows prior hardware and mild spinal canal stenosis.  Hyponatremia Sodium 126.  Continue IV fluids and salt tablets started by nephrology.  Ground-level fall Physical therapy recommending rehab  Leukocytosis Likely reactive from fall  Acute metabolic encephalopathy Likely secondary to pain medications and gabapentin.  Gabapentin and IV pain medications discontinued a few nights ago.  Decreased oral pain medications.  Try to convert to Tylenol as quickly as possible.  Mental status better today.  Chronic diastolic CHF (congestive heart failure) (HCC) With creatinine rise hold diuretics, lisinopril and spironolactone.  Type 2 diabetes mellitus with diabetic chronic kidney disease (HCC) Continue low-dose insulin.  OSA on CPAP - CPAP at  bedtime  CAD (coronary artery disease), autologous vein bypass graft Hold propranolol  Essential hypertension Hold spironolactone and lisinopril with creatinine elevation.  Holding propranolol.  Continue Cardizem.  Iron deficiency anemia Hemoglobin drifting down to 7.9 with IV fluid hydration and bilateral arm bruising.  Continue to monitor.        Subjective: Patient feeling a little better.  Still having right arm twitching.  Still having pain.  Kidney function improved today.  Still urinating very little.  Physical Exam: Vitals:   11/02/22 1724 11/02/22 1923 11/03/22 0339 11/03/22 0832  BP: (!) 106/95 (!) 113/56 (!) 142/47 (!) 121/50  Pulse: 73 76 81 80  Resp: 18 20 20 18   Temp: 97.9 F (36.6 C) 98.3 F (36.8 C) 98.7 F (37.1 C) 98.4 F (36.9 C)  TempSrc:  Oral Oral   SpO2: 99% 98% 94% 99%  Weight:      Height:       Physical Exam HENT:     Head: Normocephalic.     Mouth/Throat:     Pharynx: No oropharyngeal exudate.  Eyes:     General: Lids are normal.     Conjunctiva/sclera: Conjunctivae normal.  Cardiovascular:     Rate and Rhythm: Normal rate and regular rhythm.     Heart sounds: Normal heart sounds, S1 normal and S2 normal.  Pulmonary:     Breath sounds: Examination of the right-lower field reveals decreased breath sounds. Examination of the left-lower field reveals decreased breath sounds. Decreased breath sounds present. No wheezing, rhonchi or rales.  Abdominal:     Palpations: Abdomen is soft.     Tenderness: There is no abdominal tenderness.  Musculoskeletal:     Right lower leg: Swelling present.     Left lower leg: Swelling present.  Skin:    General: Skin is warm.     Findings: No rash.     Comments: Bruising bilateral arms  Neurological:     Mental Status: She is alert and oriented to person, place, and time.     Data Reviewed: Creatinine improved down to 2.78, CO2 17, hemoglobin drifted down to 7.9  Family Communication: Tried to reach  patient's friend ID  Disposition: Status is: Inpatient Remains inpatient appropriate because: Would like to see creatinine improved.  Check hemoglobin again tomorrow  Planned Discharge Destination: Rehab    Time spent: 28 minutes  Author: Alford Highland, MD 11/03/2022 12:54 PM  For on call review [email or web address].

## 2022-11-03 NOTE — Progress Notes (Signed)
Physical Therapy Treatment Patient Details Name: Rhonda Tyler MRN: [id number] DOB: [birth year] Today's Date: 11/03/2022   History of Present Illness Pt is a 82 y.o. female with PMH consisting of Cirrhosis 2/2 NASH, CKD stage IIIb, type 2 diabetes, HTN, HDL, CAD, sleep apnea. Pt admitted to ED 6/20 after falling at home and reported hitting head/neck, unsure as to cause of fall. CT imaging demonstrates impacted fx of left proximal humerus. MD assessment includes hyponatremia with non-operative management of L humerus fx.    PT Comments    Pt sitting EOB with RN upon arrival.  Assisted with donning sling as she stated old one was soiled.  She is able to stand and transfer to [facility] with min a +1 to void then transition to recliner.  Breakfast tray arrives and she is set up to eat.  Cognition seems better today vs yesterday.  Left in chair with needs met.   Recommendations for follow up therapy are one component of a multi-disciplinary discharge planning process, led by the attending physician.  Recommendations may be updated based on patient status, additional functional criteria and insurance authorization.  Follow Up Recommendations  Can patient physically be transported by private vehicle: No    Assistance Recommended at Discharge Intermittent Supervision/Assistance  Patient can return home with the following A little help with walking and/or transfers;A little help with bathing/dressing/bathroom;Assistance with cooking/housework;Assist for transportation;Help with stairs or ramp for entrance   Equipment Recommendations  Other (comment)    Recommendations for Other Services       Precautions / Restrictions Precautions Precautions: Fall Required Braces or Orthoses: Sling Restrictions Weight Bearing Restrictions: Yes LUE Weight Bearing: Non weight bearing     Mobility  Bed Mobility               General bed mobility comments: sitting EOB with RN upon arrival     Transfers Overall transfer level: Needs assistance Equipment used: 1 person hand held assist Transfers: Sit to/from Stand, Bed to chair/wheelchair/BSC Sit to Stand: Min assist   Step pivot transfers: Min assist, Mod assist       General transfer comment: to [facility] to void then to recliner    Ambulation/Gait Ambulation/Gait assistance: Min assist, Mod assist Gait Distance (Feet): 2 Feet Assistive device: 1 person hand held assist Gait Pattern/deviations: Step-to pattern, Trunk flexed Gait velocity: decreased     General Gait Details: small steps to chair but limited non-functional gait distances.   Stairs             Wheelchair Mobility    Modified Rankin (Stroke Patients Only)       Balance Overall balance assessment: Needs assistance Sitting-balance support: No upper extremity supported, Feet unsupported Sitting balance-Leahy Scale: Good     Standing balance support: During functional activity, Single extremity supported Standing balance-Leahy Scale: Fair Standing balance comment: HHA and +1 assist for limited mobility                            Cognition Arousal/Alertness: Awake/alert Behavior During Therapy: WFL for tasks assessed/performed Overall Cognitive Status: Within Functional Limits for tasks assessed                                 General Comments: seems more intact today and appropriate        Exercises      General Comments  Pertinent Vitals/Pain Pain Assessment Pain Assessment: Faces Faces Pain Scale: Hurts whole lot Pain Location: L arm/shoulder with mobility Pain Descriptors / Indicators: Guarding, Grimacing Pain Intervention(s): Limited activity within patient's tolerance, Monitored during session, Repositioned    Home Living                          Prior Function            PT Goals (current goals can now be found in the care plan section) Progress towards PT goals:  Progressing toward goals    Frequency    7X/week      PT Plan Current plan remains appropriate    Co-evaluation              AM-PAC PT "6 Clicks" Mobility   Outcome Measure  Help needed turning from your back to your side while in a flat bed without using bedrails?: A Little Help needed moving from lying on your back to sitting on the side of a flat bed without using bedrails?: A Lot Help needed moving to and from a bed to a chair (including a wheelchair)?: A Little Help needed standing up from a chair using your arms (e.g., wheelchair or bedside chair)?: A Little Help needed to walk in hospital room?: A Lot Help needed climbing 3-5 steps with a railing? : Total 6 Click Score: 14    End of Session Equipment Utilized During Treatment: Gait belt Activity Tolerance: Patient tolerated treatment well Patient left: in chair;with call bell/phone within reach;with chair alarm set;with nursing/sitter in room Nurse Communication: Weight bearing status;Mobility status PT Visit Diagnosis: Repeated falls (R29.6);History of falling (Z91.81);Pain;Difficulty in walking, not elsewhere classified (R26.2);Muscle weakness (generalized) (M62.81) Pain - Right/Left: Left Pain - part of body: Arm;Shoulder     Time: 1610-9604 PT Time Calculation (min) (ACUTE ONLY): 12 min  Charges:  $Therapeutic Activity: 8-22 mins                   Danielle Dess, PTA 11/03/22, 9:26 AM

## 2022-11-03 NOTE — Inpatient Diabetes Management (Signed)
Inpatient Diabetes Program Recommendations  AACE/ADA: New Consensus Statement on Inpatient Glycemic Control (2015)  Target Ranges:  Prepandial:   less than 140 mg/dL      Peak postprandial:   less than 180 mg/dL (1-2 hours)      Critically ill patients:  140 - 180 mg/dL   Lab Results  Component Value Date   GLUCAP 239 (H) 11/03/2022   HGBA1C 6.3 (H) 09/17/2022    Review of Glycemic Control  Expand All Collapse All  Inpatient Diabetes Program Recommendations   AACE/ADA: New Consensus Statement on Inpatient Glycemic Control (2015)   Target Ranges:  Prepandial:              less than 140 mg/dL                            Peak postprandial:    less than 180 mg/dL (1-2 hours)                            Critically ill patients:  140 - 180 mg/dL    Recent Labs      Latest Reference Range & Units 11/02/22 08:05 11/02/22 11:30 11/02/22 17:22 11/02/22 21:14 11/03/22 08:28  Glucose-Capillary 70 - 99 mg/dL 573 (H) 220 (H) 254 (H) 235 (H) 239 (H)  (H): Data is abnormally high  Diabetes history: DM2 Outpatient Diabetes medications:  70/30 25 units BID Novolin R 0-10 units at lunch Current orders for Inpatient glycemic control:  Semglee 5 units every day Novolog 0-15 units TID   Inpatient Diabetes Program Recommendations:     Please consider increasing basal insulin:   Semglee 15 units QHS   Will continue to follow while inpatient.   Thank you, Dulce Sellar, MSN, CDCES Diabetes Coordinator Inpatient Diabetes Program [phone] (team pager from 8a-5p)

## 2022-11-03 NOTE — Care Management Important Message (Signed)
Important Message  Patient Details  Name: Rhonda Tyler MRN: [id number] Date of Birth: January 21, 1941   Medicare Important Message Given:  Yes  Patient asleep upon time of visit, no family in room. Copy of Medicare IM left on windowsill in room for reference.   Johnell Comings 11/03/2022, 10:54 AM

## 2022-11-03 NOTE — Progress Notes (Signed)
Occupational Therapy Treatment Patient Details Name: Rhonda Tyler MRN: [id number] DOB: [birth year] Today's Date: 11/03/2022   History of present illness Pt is a 81 y.o. female with PMH consisting of Cirrhosis 2/2 NASH, CKD stage IIIb, type 2 diabetes, HTN, HDL, CAD, sleep apnea. Pt admitted to ED 6/20 after falling at home and reported hitting head/neck, unsure as to cause of fall. CT imaging demonstrates impacted fx of left proximal humerus. MD assessment includes hyponatremia with non-operative management of L humerus fx.   OT comments  Pt seen for OT session, limited by 8/10 L shoulder pain and nausea. RN notified. Pt provided with blue emesis bag, diet cola, and warm wash cloth per pt's request. Pt washed her face and combed her hair with setup for supplies using R dom hand with no increase in LUE pain. OT adjusted LUE sling for improved positioning and educated pt on proper positioning, how to self adjust, and positioning of LUE with pillows to improve comfort while maintaining optimal joint positioning. Pt verbalized understanding. Pt continues to benefit from skilled OT services.    Recommendations for follow up therapy are one component of a multi-disciplinary discharge planning process, led by the attending physician.  Recommendations may be updated based on patient status, additional functional criteria and insurance authorization.    Assistance Recommended at Discharge Frequent or constant Supervision/Assistance  Patient can return home with the following  A little help with walking and/or transfers;A lot of help with bathing/dressing/bathroom;Assistance with cooking/housework;Assist for transportation   Equipment Recommendations  Other (comment) (hemi-walker?)    Recommendations for Other Services      Precautions / Restrictions Precautions Precautions: Fall Required Braces or Orthoses: Sling Restrictions Weight Bearing Restrictions: Yes LUE Weight Bearing: Non weight bearing        Mobility Bed Mobility               General bed mobility comments: pt declined 2/2 nausea and significant pain    Transfers                         Balance                                           ADL either performed or assessed with clinical judgement   ADL Overall ADL's : Needs assistance/impaired Eating/Feeding: Minimal assistance   Grooming: Wash/dry face;Set up;Brushing hair                                      Extremity/Trunk Assessment              Vision       Perception     Praxis      Cognition Arousal/Alertness: Awake/alert Behavior During Therapy: WFL for tasks assessed/performed Overall Cognitive Status: Within Functional Limits for tasks assessed                                          Exercises Other Exercises Other Exercises: Adjusted LUE sling for improved positioning and educated pt on proper positioning, how to self adjust, and positioning of LUE with pillows to improve comfort while maintaining optimal joint positioning    Shoulder  Instructions       General Comments      Pertinent Vitals/ Pain       Pain Assessment Pain Assessment: 0-10 Pain Score: 8  Pain Location: L arm/shoulder with mobility Pain Descriptors / Indicators: Guarding, Grimacing, Aching Pain Intervention(s): Limited activity within patient's tolerance, Monitored during session, Repositioned, Patient requesting pain meds-RN notified  Home Living                                          Prior Functioning/Environment              Frequency  Min 2X/week        Progress Toward Goals  OT Goals(current goals can now be found in the care plan section)  Progress towards OT goals: OT to reassess next treatment  Acute Rehab OT Goals Patient Stated Goal: to go home OT Goal Formulation: With patient Time For Goal Achievement: 11/13/22 Potential to Achieve Goals:  Good  Plan Discharge plan remains appropriate;Frequency remains appropriate    Co-evaluation                 AM-PAC OT "6 Clicks" Daily Activity     Outcome Measure   Help from another person eating meals?: A Little Help from another person taking care of personal grooming?: A Little Help from another person toileting, which includes using toliet, bedpan, or urinal?: A Lot Help from another person bathing (including washing, rinsing, drying)?: A Lot Help from another person to put on and taking off regular upper body clothing?: A Lot Help from another person to put on and taking off regular lower body clothing?: A Lot 6 Click Score: 14    End of Session    OT Visit Diagnosis: Unsteadiness on feet (R26.81);Muscle weakness (generalized) (M62.81);Pain;Repeated falls (R29.6) Pain - Right/Left: Left Pain - part of body: Shoulder;Arm   Activity Tolerance Patient limited by pain;Other (comment) (nausea)   Patient Left in bed;with call bell/phone within reach;with bed alarm set   Nurse Communication Patient requests pain meds (nausea)        Time: 1610-9604 OT Time Calculation (min): 11 min  Charges: OT General Charges $OT Visit: 1 Visit OT Treatments $Self Care/Home Management : 8-22 mins  Arman Filter., MPH, MS, OTR/L ascom [phone] 11/03/22, 3:56 PM

## 2022-11-04 DIAGNOSIS — N179 Acute kidney failure, unspecified: Secondary | ICD-10-CM | POA: Diagnosis not present

## 2022-11-04 DIAGNOSIS — N189 Chronic kidney disease, unspecified: Secondary | ICD-10-CM | POA: Diagnosis not present

## 2022-11-04 LAB — CBC
HCT: 25.2 % — ABNORMAL LOW (ref 36.0–46.0)
Hemoglobin: 8.2 g/dL — ABNORMAL LOW (ref 12.0–15.0)
MCH: 27.4 pg (ref 26.0–34.0)
MCHC: 32.5 g/dL (ref 30.0–36.0)
MCV: 84.3 fL (ref 80.0–100.0)
Platelets: 202 10*3/uL (ref 150–400)
RBC: 2.99 MIL/uL — ABNORMAL LOW (ref 3.87–5.11)
RDW: 15.4 % (ref 11.5–15.5)
WBC: 3.7 10*3/uL — ABNORMAL LOW (ref 4.0–10.5)
nRBC: 0 % (ref 0.0–0.2)

## 2022-11-04 LAB — GLUCOSE, CAPILLARY
Glucose-Capillary: 160 mg/dL — ABNORMAL HIGH (ref 70–99)
Glucose-Capillary: 207 mg/dL — ABNORMAL HIGH (ref 70–99)
Glucose-Capillary: 187 mg/dL — ABNORMAL HIGH (ref 70–99)
Glucose-Capillary: 216 mg/dL — ABNORMAL HIGH (ref 70–99)

## 2022-11-04 LAB — BASIC METABOLIC PANEL WITH GFR
Anion gap: 7 (ref 5–15)
BUN: 46 mg/dL — ABNORMAL HIGH (ref 8–23)
CO2: 20 mmol/L — ABNORMAL LOW (ref 22–32)
Calcium: 8.5 mg/dL — ABNORMAL LOW (ref 8.9–10.3)
Chloride: 107 mmol/L (ref 98–111)
Creatinine, Ser: 1.73 mg/dL — ABNORMAL HIGH (ref 0.44–1.00)
GFR, Estimated: 30 mL/min — ABNORMAL LOW (ref >60)
Glucose, Bld: 181 mg/dL — ABNORMAL HIGH (ref 70–99)
Potassium: 4.5 mmol/L (ref 3.5–5.1)
Sodium: 131 mmol/L — ABNORMAL LOW (ref 135–145)

## 2022-11-04 NOTE — TOC Progression Note (Signed)
Transition of Care [facility]) - Progression Note    Patient Details  Name: Rhonda Tyler MRN: [id number] Date of Birth: [birth year]  Transition of Care [facility]) CM/SW Contact  Kreg Shropshire, RN Phone Number: 11/04/2022, 1:33 PM  Clinical Narrative:    1433- Cm sent message to Dr. Mayford Knife to ask about anticipated d/c date. She has a bed at [facility].   Expected Discharge Plan: Home w Home Health Services Barriers to Discharge: Continued Medical Work up  Expected Discharge Plan and Services       Living arrangements for the past 2 months: Single Family Home (Has an aid come in x3 a week)                           HH Arranged: Charity fundraiser, PT HH Agency: [facility] Date HH Agency Contacted: 10/30/22 Time HH Agency Contacted: 1442 Representative spoke with at [facility] Agency: Feliberto Gottron of [facility]   Social Determinants of Health (SDOH) Interventions SDOH Screenings   Food Insecurity: No Food Insecurity (09/13/2022)  Housing: Low Risk  (09/13/2022)  Transportation Needs: No Transportation Needs (09/13/2022)  Utilities: Not At Risk (09/13/2022)  Tobacco Use: Medium Risk (10/29/2022)    Readmission Risk Interventions    09/15/2022   10:21 AM  Readmission Risk Prevention Plan  Transportation Screening Complete  PCP or Specialist Appt within 3-5 Days Complete  HRI or Home Care Consult Complete  Social Work Consult for Recovery Care Planning/Counseling Complete  Palliative Care Screening Complete  Medication Review Oceanographer) Complete

## 2022-11-04 NOTE — Progress Notes (Signed)
Physical Therapy Treatment Patient Details Name: Rhonda Tyler MRN: [id number] DOB: 1940/12/27 Today's Date: 11/04/2022   History of Present Illness Pt is a 82 y.o. female with PMH consisting of Cirrhosis 2/2 NASH, CKD stage IIIb, type 2 diabetes, HTN, HDL, CAD, sleep apnea. Pt admitted to ED 6/20 after falling at home and reported hitting head/neck, unsure as to cause of fall. CT imaging demonstrates impacted fx of left proximal humerus. MD assessment includes hyponatremia with non-operative management of L humerus fx.    PT Comments    Pt progressed today with gradual increase in gait distance (28ft with IV pole and min A).  Set pt up in recliner after gait but pt reported that she could not tolerate reporting to much pain in L shoulder (sling was donned and appropriately adjusted throughout session). Returned pt back to bed per pt request.  Pt is consistently demonstrating min A transfers.  Mobility continues to be limited due to severe pain in L shoulder. Pt will benefit from continued PT services upon discharge to safely address deficits listed in patient problem list for decreased caregiver assistance and eventual return to PLOF.   Recommendations for follow up therapy are one component of a multi-disciplinary discharge planning process, led by the attending physician.  Recommendations may be updated based on patient status, additional functional criteria and insurance authorization.  Follow Up Recommendations  Can patient physically be transported by private vehicle: No    Assistance Recommended at Discharge Intermittent Supervision/Assistance  Patient can return home with the following A little help with walking and/or transfers;A little help with bathing/dressing/bathroom;Assistance with cooking/housework;Assist for transportation;Help with stairs or ramp for entrance   Equipment Recommendations       Recommendations for Other Services       Precautions / Restrictions  Precautions Precautions: Fall Required Braces or Orthoses: Sling Restrictions Weight Bearing Restrictions: No LUE Weight Bearing: Non weight bearing     Mobility  Bed Mobility Overal bed mobility: Needs Assistance Bed Mobility: Supine to Sit, Sit to Supine     Supine to sit: Mod assist, HOB elevated Sit to supine: Max assist (Assist to help with both legs)   General bed mobility comments: cues for sequencing and hand placement.    Transfers Overall transfer level: Needs assistance Equipment used: 1 person hand held assist Transfers: Sit to/from Stand, Bed to chair/wheelchair/BSC Sit to Stand: Min assist   Step pivot transfers: Min assist       General transfer comment: to [facility] to void then to recliner    Ambulation/Gait Ambulation/Gait assistance: Min assist Gait Distance (Feet): 10 Feet Assistive device: IV Pole Gait Pattern/deviations: Step-to pattern, Trunk flexed Gait velocity: decreased     General Gait Details: limited due to severe LUE pain (sling donned during session).   Stairs             Wheelchair Mobility    Modified Rankin (Stroke Patients Only)       Balance Overall balance assessment: Needs assistance Sitting-balance support: No upper extremity supported, Feet unsupported Sitting balance-Leahy Scale: Good     Standing balance support: During functional activity, Single extremity supported Standing balance-Leahy Scale: Fair Standing balance comment: HHA and +1 assist for limited mobility                            Cognition Arousal/Alertness: Awake/alert Behavior During Therapy: WFL for tasks assessed/performed Overall Cognitive Status: Within Functional Limits for tasks assessed  Exercises      General Comments        Pertinent Vitals/Pain Pain Assessment Pain Assessment: Faces Faces Pain Scale: Hurts whole lot Pain Location: L arm/shoulder with  mobility Pain Descriptors / Indicators: Guarding, Grimacing, Aching Pain Intervention(s): Limited activity within patient's tolerance, Monitored during session, Repositioned, Premedicated before session    Home Living                          Prior Function            PT Goals (current goals can now be found in the care plan section) Acute Rehab PT Goals Patient Stated Goal: Walker farther/better, increase strength PT Goal Formulation: With patient Time For Goal Achievement: 11/12/22 Potential to Achieve Goals: Good Progress towards PT goals: Progressing toward goals    Frequency    7X/week      PT Plan Current plan remains appropriate    Co-evaluation              AM-PAC PT "6 Clicks" Mobility   Outcome Measure  Help needed turning from your back to your side while in a flat bed without using bedrails?: A Little Help needed moving from lying on your back to sitting on the side of a flat bed without using bedrails?: A Lot Help needed moving to and from a bed to a chair (including a wheelchair)?: A Little Help needed standing up from a chair using your arms (e.g., wheelchair or bedside chair)?: A Little Help needed to walk in hospital room?: A Lot Help needed climbing 3-5 steps with a railing? : Total 6 Click Score: 14    End of Session Equipment Utilized During Treatment: Gait belt Activity Tolerance: Patient tolerated treatment well Patient left: with nursing/sitter in room;in bed;with bed alarm set;with call bell/phone within reach Nurse Communication: Weight bearing status;Mobility status PT Visit Diagnosis: Repeated falls (R29.6);History of falling (Z91.81);Pain;Difficulty in walking, not elsewhere classified (R26.2);Muscle weakness (generalized) (M62.81) Pain - Right/Left: Left Pain - part of body: Arm;Shoulder     Time: 0981-1914 PT Time Calculation (min) (ACUTE ONLY): 20 min  Charges:                        Hortencia Conradi, PTA   11/04/22, 12:55 PM

## 2022-11-04 NOTE — Inpatient Diabetes Management (Signed)
Inpatient Diabetes Program Recommendations  AACE/ADA: New Consensus Statement on Inpatient Glycemic Control (2015)  Target Ranges:  Prepandial:   less than 140 mg/dL      Peak postprandial:   less than 180 mg/dL (1-2 hours)      Critically ill patients:  140 - 180 mg/dL   Lab Results  Component Value Date   GLUCAP 160 (H) 11/04/2022   HGBA1C 6.3 (H) 09/17/2022    Review of Glycemic Control  Latest Reference Range & Units 11/02/22 21:14 11/03/22 08:28 11/03/22 16:53 11/03/22 21:04 11/04/22 07:30  Glucose-Capillary 70 - 99 mg/dL 409 (H) 811 (H) 914 (H) 179 (H) 160 (H)  (H): Data is abnormally high Diabetes history: DM2 Outpatient Diabetes medications:  70/30 25 units BID Novolin R 0-10 units at lunch Current orders for Inpatient glycemic control:  Semglee 5 units every day Novolog 0-15 units TID   Inpatient Diabetes Program Recommendations:     Consider: - increasing Semglee 8 units at bedtime - Novolog 3 units TID (assuming patient is consuming >50% of meals)   Will continue to follow while inpatient.  Thanks, Lujean Rave, MSN, RNC-OB Diabetes Coordinator [phone] (8a-5p)

## 2022-11-04 NOTE — Progress Notes (Signed)
PROGRESS NOTE    Rhonda Tyler  QMV:[id number] DOB: 1941/03/20 DOA: 10/29/2022 PCP: Lauro Regulus, MD   Assessment & Plan:   Principal Problem:   Acute kidney injury superimposed on CKD [facility]) Active Problems:   Humerus fracture   Hyponatremia   Muscle twitching   Ground-level fall   Leukocytosis   Iron deficiency anemia   Essential hypertension   CAD (coronary artery disease), autologous vein bypass graft   OSA on CPAP   Type 2 diabetes mellitus with diabetic chronic kidney disease (HCC)   Chronic diastolic CHF (congestive heart failure) (HCC)   Acute metabolic encephalopathy  Assessment and Plan: AKI on CKDIIIb: likely secondary to contrast induced from a recent CT scan. Cr is trending down today.    Humerus fracture: impacted fracture of the left proximal humerus as per XR. Ortho sur recommending immobilization as treatment.  Continue arm sling.   Muscle twitching: likely secondary to gabapentin sticking around too long in setting of AKI on CKD. MRI brain showed no acute intracranial findings. MRI cervical spine shows prior hardware and mild spinal canal stenosis.   Hyponatremia: trending up. Continue on IVFs & NaCl tabs. Nephro recs apprec    Ground-level fall: PT/OT recs SNF. Likely d/c to SNF tomorrow    Leukocytosis: resolved   Acute metabolic encephalopathy: likely secondary to pain medications and gabapentin.  Holding gabapentin. Mental status has improved   Chronic diastolic CHF: holding home dose of aldactone, lisinopril. Appears compensated. Monitor I/Os    DM2: likely poorly controlled. Continue on glargine, SSI w/ accuchecks    OSA : continue on CPAP at bedtime    Hx of CAD: w/ hx of CABG. Holding home dose of lisinopril, aldactone, propranolol    HTN: continue on home dose of cardizem. Holding home aldactone, lisinopril, propranolol    Iron deficiency anemia: continue on iron supplement. H&H are trending up today           DVT prophylaxis:  heparin SQ  Code Status: full  Family Communication:  Disposition Plan: will d/c to SNF tomorrow  Status is: Inpatient Remains inpatient appropriate because: will d/c to SNF tomorrow     Level of care: Med-Surg Consultants:  Nephro   Procedures:   Antimicrobials:    Subjective: Pt c/o nausea   Objective: Vitals:   11/03/22 1653 11/03/22 1925 11/04/22 0438 11/04/22 0727  BP: (!) 124/54 (!) 143/51 (!) 157/52 (!) 141/61  Pulse: 86 88 87 84  Resp: 18 20 20 20   Temp: 97.9 F (36.6 C) 98.6 F (37 C) 97.8 F (36.6 C) 97.6 F (36.4 C)  TempSrc:  Oral Oral Oral  SpO2: 97% 99% 98% 98%  Weight:      Height:        Intake/Output Summary (Last 24 hours) at 11/04/2022 0842 Last data filed at 11/04/2022 0529 Gross per 24 hour  Intake 240 ml  Output 0 ml  Net 240 ml   Filed Weights   10/29/22 1128  Weight: 71.7 kg    Examination:  General exam: Appears calm but uncomfortable  Respiratory system: Clear to auscultation. Respiratory effort normal. Cardiovascular system: S1 & S2+. No rubs, gallops or clicks. Gastrointestinal system: Abdomen is nondistended, soft and nontender.. Normal bowel sounds heard. Central nervous system: Alert and awake  Psychiatry: Judgement and insight appears close to baseline. Flat mood and affect     Data Reviewed: I have personally reviewed following labs and imaging studies  CBC: Recent Labs  Lab 10/30/22 0352  10/31/22 0356 11/01/22 4098 11/02/22 0423 11/03/22 0819 11/04/22 0509  WBC 11.9* 9.5 6.2 5.1  --  3.7*  HGB 10.0* 8.8* 8.4* 8.5* 7.9* 8.2*  HCT 31.5* 28.0* 26.4* 26.6*  --  25.2*  MCV 87.3 86.7 85.7 85.0  --  84.3  PLT 192 161 159 172  --  202   Basic Metabolic Panel: Recent Labs  Lab 10/31/22 0356 11/01/22 0811 11/02/22 0423 11/03/22 0819 11/04/22 0509  NA 125* 124* 126* 126* 131*  K 4.6 4.7 4.7 4.7 4.5  CL 93* 93* 98 100 107  CO2 21* 21* 18* 17* 20*  GLUCOSE 307* 331* 217* 222* 181*  BUN 57* 73* 74* 66* 46*   CREATININE 2.75* 4.01* 4.02* 2.78* 1.73*  CALCIUM 8.1* 8.2* 8.1* 8.1* 8.5*   GFR: Estimated Creatinine Clearance: 25 mL/min (A) (by C-G formula based on SCr of 1.73 mg/dL (H)). Liver Function Tests: Recent Labs  Lab 10/29/22 1123  AST 28  ALT 16  ALKPHOS 124  BILITOT 1.7*  PROT 7.1  ALBUMIN 4.0   No results for input(s): "LIPASE", "AMYLASE" in the last 168 hours. No results for input(s): "AMMONIA" in the last 168 hours. Coagulation Profile: No results for input(s): "INR", "PROTIME" in the last 168 hours. Cardiac Enzymes: No results for input(s): "CKTOTAL", "CKMB", "CKMBINDEX", "TROPONINI" in the last 168 hours. BNP (last 3 results) No results for input(s): "PROBNP" in the last 8760 hours. HbA1C: No results for input(s): "HGBA1C" in the last 72 hours. CBG: Recent Labs  Lab 11/02/22 2114 11/03/22 0828 11/03/22 1653 11/03/22 2104 11/04/22 0730  GLUCAP 235* 239* 256* 179* 160*   Lipid Profile: No results for input(s): "CHOL", "HDL", "LDLCALC", "TRIG", "CHOLHDL", "LDLDIRECT" in the last 72 hours. Thyroid Function Tests: No results for input(s): "TSH", "T4TOTAL", "FREET4", "T3FREE", "THYROIDAB" in the last 72 hours. Anemia Panel: No results for input(s): "VITAMINB12", "FOLATE", "FERRITIN", "TIBC", "IRON", "RETICCTPCT" in the last 72 hours. Sepsis Labs: No results for input(s): "PROCALCITON", "LATICACIDVEN" in the last 168 hours.  No results found for this or any previous visit (from the past 240 hour(s)).       Radiology Studies: MR BRAIN WO CONTRAST  Result Date: 11/02/2022 CLINICAL DATA:  Larey Seat and hit head and neck, right arm tremor EXAM: MRI HEAD WITHOUT CONTRAST MRI CERVICAL SPINE WITHOUT CONTRAST TECHNIQUE: Multiplanar, multiecho pulse sequences of the brain and surrounding structures, and cervical spine, to include the craniocervical junction and cervicothoracic junction, were obtained without intravenous contrast. COMPARISON:  12/18/2021 MRI head, 10/29/2022  CT head and cervical spine FINDINGS: MRI HEAD FINDINGS Brain: No restricted diffusion to suggest acute or subacute infarct. No acute hemorrhage, mass, mass effect, or midline shift. No hydrocephalus or extra-axial collection. Normal pituitary and craniocervical junction. No hemosiderin deposition to suggest remote hemorrhage. Normal cerebral volume for age. Vascular: Normal arterial flow voids. Skull and upper cervical spine: Normal marrow signal. Sinuses/Orbits: Clear paranasal sinuses. No acute finding in the orbits. Status post bilateral lens replacements. Other: The mastoid air cells are well aerated. MRI CERVICAL SPINE FINDINGS Evaluation is somewhat limited by motion artifact. Alignment: No significant listhesis. Vertebrae: Status post C3-C7 corpectomy and anterior fusion. Susceptibility artifact from the hardware limits evaluation of these levels and the adjacent spinal canal. Within this limitation, no acute fracture, evidence of discitis, or suspicious osseous lesion. Congenitally short pedicles, which narrow the AP diameter of the spinal canal. Cord: Normal signal and morphology. No epidural collection or hematoma. Posterior Fossa, vertebral arteries, paraspinal tissues: Negative. Disc levels: C2-C3:  No significant disc bulge. Mild spinal canal stenosis, primarily secondary to short pedicles. Mild left neural foraminal narrowing, although evaluation is limited by motion. C3-C4: Status post corpectomy. Likely mild spinal canal stenosis, although evaluation is limited by susceptibility and motion. No significant neural foraminal narrowing. C4-C5: Status post corpectomy. Likely mild spinal canal stenosis, although evaluation is limited by susceptibility and motion. No significant neural foraminal narrowing. C5-C6: Status post corpectomy. Likely mild spinal canal stenosis, although evaluation is limited by susceptibility and motion. No significant neural foraminal narrowing. C6-C7: Status post corpectomy. No  spinal canal stenosis. Mild bilateral neural foraminal narrowing. C7-T1: No significant disc bulge. No spinal canal stenosis or neuroforaminal narrowing. IMPRESSION: 1. No acute intracranial process. 2. Status post C3-C7 corpectomy and anterior fusion. Susceptibility artifact from the hardware limits evaluation of these levels and the adjacent spinal canal. Within this limitation, there is likely mild spinal canal stenosis at C3-C4, C4-C5, and C5-C6. 3. Mild neural foraminal narrowing on the left at C2-C3 and bilaterally at C6-C7. 4. No evidence of trauma in the cervical spine Electronically Signed   By: Wiliam Ke M.D.   On: 11/02/2022 13:44   MR CERVICAL SPINE WO CONTRAST  Result Date: 11/02/2022 CLINICAL DATA:  Larey Seat and hit head and neck, right arm tremor EXAM: MRI HEAD WITHOUT CONTRAST MRI CERVICAL SPINE WITHOUT CONTRAST TECHNIQUE: Multiplanar, multiecho pulse sequences of the brain and surrounding structures, and cervical spine, to include the craniocervical junction and cervicothoracic junction, were obtained without intravenous contrast. COMPARISON:  12/18/2021 MRI head, 10/29/2022 CT head and cervical spine FINDINGS: MRI HEAD FINDINGS Brain: No restricted diffusion to suggest acute or subacute infarct. No acute hemorrhage, mass, mass effect, or midline shift. No hydrocephalus or extra-axial collection. Normal pituitary and craniocervical junction. No hemosiderin deposition to suggest remote hemorrhage. Normal cerebral volume for age. Vascular: Normal arterial flow voids. Skull and upper cervical spine: Normal marrow signal. Sinuses/Orbits: Clear paranasal sinuses. No acute finding in the orbits. Status post bilateral lens replacements. Other: The mastoid air cells are well aerated. MRI CERVICAL SPINE FINDINGS Evaluation is somewhat limited by motion artifact. Alignment: No significant listhesis. Vertebrae: Status post C3-C7 corpectomy and anterior fusion. Susceptibility artifact from the hardware  limits evaluation of these levels and the adjacent spinal canal. Within this limitation, no acute fracture, evidence of discitis, or suspicious osseous lesion. Congenitally short pedicles, which narrow the AP diameter of the spinal canal. Cord: Normal signal and morphology. No epidural collection or hematoma. Posterior Fossa, vertebral arteries, paraspinal tissues: Negative. Disc levels: C2-C3: No significant disc bulge. Mild spinal canal stenosis, primarily secondary to short pedicles. Mild left neural foraminal narrowing, although evaluation is limited by motion. C3-C4: Status post corpectomy. Likely mild spinal canal stenosis, although evaluation is limited by susceptibility and motion. No significant neural foraminal narrowing. C4-C5: Status post corpectomy. Likely mild spinal canal stenosis, although evaluation is limited by susceptibility and motion. No significant neural foraminal narrowing. C5-C6: Status post corpectomy. Likely mild spinal canal stenosis, although evaluation is limited by susceptibility and motion. No significant neural foraminal narrowing. C6-C7: Status post corpectomy. No spinal canal stenosis. Mild bilateral neural foraminal narrowing. C7-T1: No significant disc bulge. No spinal canal stenosis or neuroforaminal narrowing. IMPRESSION: 1. No acute intracranial process. 2. Status post C3-C7 corpectomy and anterior fusion. Susceptibility artifact from the hardware limits evaluation of these levels and the adjacent spinal canal. Within this limitation, there is likely mild spinal canal stenosis at C3-C4, C4-C5, and C5-C6. 3. Mild neural foraminal narrowing on the left  at C2-C3 and bilaterally at C6-C7. 4. No evidence of trauma in the cervical spine Electronically Signed   By: Wiliam Ke M.D.   On: 11/02/2022 13:44        Scheduled Meds:  diltiazem  120 mg Oral Daily   fenofibrate  160 mg Oral Daily   ferrous sulfate  325 mg Oral Q breakfast   heparin injection (subcutaneous)   5,000 Units Subcutaneous Q8H   insulin aspart  0-15 Units Subcutaneous TID WC   insulin glargine-yfgn  5 Units Subcutaneous QHS   loratadine  10 mg Oral Daily   pantoprazole  40 mg Oral BID   sertraline  100 mg Oral Daily   sodium chloride flush  3 mL Intravenous Q12H   sodium chloride  1 g Oral BID WC   Continuous Infusions:  sodium chloride 40 mL/hr at 11/03/22 1229     LOS: 4 days    Time spent: 25 mins     Charise Killian, MD [facility] Pager [phone]  If 7PM-7AM, please contact night-coverage [email or web address] 11/04/2022, 8:42 AM

## 2022-11-05 DIAGNOSIS — N189 Chronic kidney disease, unspecified: Secondary | ICD-10-CM | POA: Diagnosis not present

## 2022-11-05 DIAGNOSIS — N179 Acute kidney failure, unspecified: Secondary | ICD-10-CM | POA: Diagnosis not present

## 2022-11-05 LAB — CBC
HCT: 25.5 % — ABNORMAL LOW (ref 36.0–46.0)
Hemoglobin: 8 g/dL — ABNORMAL LOW (ref 12.0–15.0)
MCH: 26.8 pg (ref 26.0–34.0)
MCHC: 31.4 g/dL (ref 30.0–36.0)
MCV: 85.6 fL (ref 80.0–100.0)
Platelets: 211 10*3/uL (ref 150–400)
RBC: 2.98 MIL/uL — ABNORMAL LOW (ref 3.87–5.11)
RDW: 15.6 % — ABNORMAL HIGH (ref 11.5–15.5)
WBC: 4.2 10*3/uL (ref 4.0–10.5)
nRBC: 0 % (ref 0.0–0.2)

## 2022-11-05 LAB — GLUCOSE, CAPILLARY
Glucose-Capillary: 205 mg/dL — ABNORMAL HIGH (ref 70–99)
Glucose-Capillary: 221 mg/dL — ABNORMAL HIGH (ref 70–99)

## 2022-11-05 LAB — BASIC METABOLIC PANEL
Anion gap: 7 (ref 5–15)
BUN: 29 mg/dL — ABNORMAL HIGH (ref 8–23)
CO2: 20 mmol/L — ABNORMAL LOW (ref 22–32)
Calcium: 8.8 mg/dL — ABNORMAL LOW (ref 8.9–10.3)
Chloride: 105 mmol/L (ref 98–111)
Creatinine, Ser: 1.28 mg/dL — ABNORMAL HIGH (ref 0.44–1.00)
GFR, Estimated: 43 mL/min — ABNORMAL LOW (ref >60)
Glucose, Bld: 206 mg/dL — ABNORMAL HIGH (ref 70–99)
Potassium: 4.6 mmol/L (ref 3.5–5.1)
Sodium: 132 mmol/L — ABNORMAL LOW (ref 135–145)

## 2022-11-05 MED ORDER — PROMETHAZINE HCL 6.25 MG/5ML PO SOLN: 6.2500 mg | Freq: Four times a day (QID) | ORAL | 0 refills | Status: DC | PRN

## 2022-11-05 MED ORDER — SODIUM CHLORIDE 1 G PO TABS: 1.0000 g | ORAL_TABLET | Freq: Two times a day (BID) | ORAL | 0 refills | Status: AC

## 2022-11-05 MED ORDER — LISINOPRIL 5 MG PO TABS: 5.0000 mg | ORAL_TABLET | Freq: Every day | ORAL | Status: AC

## 2022-11-05 MED ORDER — PROMETHAZINE HCL 25 MG/ML IJ SOLN
12.5000 mg | Freq: Four times a day (QID) | INTRAMUSCULAR | Status: DC | PRN
  Administered 2022-11-05: 12.5 mg via INTRAMUSCULAR
  Filled 2022-11-05 (×2): qty 1

## 2022-11-05 MED ORDER — SPIRONOLACTONE 50 MG PO TABS: 50.0000 mg | ORAL_TABLET | Freq: Every day | ORAL | Status: DC

## 2022-11-05 MED ORDER — HYDROCODONE-ACETAMINOPHEN 10-325 MG PO TABS: 1.0000 | ORAL_TABLET | ORAL | 0 refills | Status: AC | PRN

## 2022-11-05 NOTE — Progress Notes (Signed)
Pt refused to wear cpap last night but in the nights before she was using auto cpap  on room air with our machine here.

## 2022-11-05 NOTE — Inpatient Diabetes Management (Signed)
Inpatient Diabetes Program Recommendations  AACE/ADA: New Consensus Statement on Inpatient Glycemic Control (2015)  Target Ranges:  Prepandial:   less than 140 mg/dL      Peak postprandial:   less than 180 mg/dL (1-2 hours)      Critically ill patients:  140 - 180 mg/dL   Lab Results  Component Value Date   GLUCAP 205 (H) 11/05/2022   HGBA1C 6.3 (H) 09/17/2022    Review of Glycemic Control  Latest Reference Range & Units 11/04/22 07:30 11/04/22 11:24 11/04/22 15:25 11/04/22 20:54 11/05/22 08:02  Glucose-Capillary 70 - 99 mg/dL 887 (H) 579 (H) 728 (H) 216 (H) 205 (H)  (H): Data is abnormally high  Diabetes history: DM2 Outpatient Diabetes medications:  70/30 25 units BID Novolin R 0-10 units at lunch Current orders for Inpatient glycemic control:  Semglee 5 units every day Novolog 0-15 units TID    Inpatient Diabetes Program Recommendations:    Consider:  -Semglee 8 units at bedtime  Will continue to follow while inpatient.  Thank you, Dulce Sellar, MSN, CDCES Diabetes Coordinator Inpatient Diabetes Program [phone] (team pager from 8a-5p)

## 2022-11-05 NOTE — Discharge Summary (Addendum)
Physician Discharge Summary  Rhonda Tyler EXN:[id number] DOB: Sep 20, 1940 DOA: 10/29/2022  PCP: Lauro Regulus, MD  Admit date: 10/29/2022 Discharge date: 11/05/2022  Admitted From: home  Disposition:  SNF  Recommendations for Outpatient Follow-up:  Follow up with PCP in 1-2 weeks F/u w/ nephro, Dr. Wynelle Link, in 1-2 weeks Needs BMP in 1-2 days to check Na level  F/u w/ ortho surg, Dr. Martha Clan, in 10-14 days  Home Health: no  Equipment/Devices:  Discharge Condition: stable  CODE STATUS: full  Diet recommendation: Heart Healthy   Brief/Interim Summary: HPI was taken from Dr. Huel Cote:  Rhonda Tyler is a 82 y.o. female with medical history significant of cirrhosis 2/2 NASH, CKD stage IIIb, type 2 diabetes, hypertension, hypertension, hyperlipidemia, CAD, sleep apnea, who presents to the ED due to a ground-level fall.   Rhonda Tyler states she was in her usual state of health this morning when her glucose monitor started beeping that her sugar was low but she cannot recall what it was.  She started walking to her laundry room to get some clothing before going to the kitchen.  She turned and believes she may have lost her footing falling down onto her knees and then landing onto her left shoulder.  She states that she did try to catch herself but was unable to she did end up hitting the back of her head as well but denies any loss of consciousness.  Prior to the fall, she is uncertain if she was experiencing any dizziness but does not believe so.  She denies any palpitations prior to the fall but notes that every once in a while, she has been experiencing palpitations over the last several months.  She denies any recent shortness of breath, lower extremity swelling, chest pain, vision changes, recent illness, N/V/D.    ED course: On arrival to the ED, patient was hypertensive at 148/79 with heart rate of 98.  She was saturating at 99% on room air.  She was afebrile at 98.  Initial workup  demonstrated WBC of 10.8, sodium of 129, BUN 34, creatinine 1.36 with GFR 40.  Troponin negative x 2.  CT of the head, chest, abdomen, C-spine obtained.  Only notable for acute clavicular fracture and impacted fracture of the left proximal humerus.  Bilateral knee x-rays were obtained with no evidence of acute findings.  Orthopedic surgery consulted.  [facility] contacted for admission  As per Dr. Renae Gloss: 6/21.  Physical therapy recommending rehab.  Patient has left arm immobilized with left proximal humerus fracture. 6/22.  Sodium dropped down to 125 and creatinine up to 2.75.  Bladder scan showed 275 mL.  Fluid bolus today and IV fluids.  Nephrology consultation. 6/23.  Creatinine rising again today up to 4.01 with a GFR of 11.  Continue IV fluid hydration.  Salt tabs but started for hyponatremia. 6/24.  Creatinine seems to have plateaued at 4.02.  Continue IV fluid hydration.  Patient with poor urine output.  Right arm twitching likely secondary to gabapentin sticking around in his system too long with acute kidney injury.  Gabapentin discontinued last night.  MRI of the brain is negative.  MRI of the cervical spine showed prior hardware and mild spinal canal stenosis. 6/25.  Patient still having right arm twitching likely secondary to gabapentin.  Case discussed with neurologist who mentioned twitching should improve once creatinine down to about 1.5.  Patient's creatinine 2.78.   As per Dr. Mayford Knife 6/26-6/27/24: Pt is medically stable to d/c to  SNF. D/c to SNF   Discharge Diagnoses:  Principal Problem:   Acute kidney injury superimposed on CKD (HCC) Active Problems:   Humerus fracture   Hyponatremia   Muscle twitching   Ground-level fall   Leukocytosis   Iron deficiency anemia   Essential hypertension   CAD (coronary artery disease), autologous vein bypass graft   OSA on CPAP   Type 2 diabetes mellitus with diabetic chronic kidney disease (HCC)   Chronic diastolic CHF (congestive heart  failure) (HCC)   Acute metabolic encephalopathy  AKI on CKDIIIb: likely secondary to contrast induced from a recent CT scan. Cr is trending down daily. Nephro recs apprec    Humerus fracture: impacted fracture of the left proximal humerus as per XR. Ortho sur recommending immobilization as treatment.  Continue arm sling. Will need to f/u outpatient w/ ortho surg, Dr. Martha Clan in 10-14 days    Muscle twitching: likely secondary to gabapentin sticking around too long in setting of AKI on CKD. MRI brain showed no acute intracranial findings. MRI cervical spine shows prior hardware and mild spinal canal stenosis.   Hyponatremia: trending up. Continue on NaCl tabs, almost WNL. Nephro recs apprec    Ground-level fall: PT/OT recs SNF.    Leukocytosis: resolved   Acute metabolic encephalopathy: likely secondary to pain medications and gabapentin.  Holding gabapentin. Mental status has improved   Chronic diastolic CHF: holding home dose of aldactone, lisinopril. Appears compensated. Monitor I/Os. Restart propranolol    DM2: likely poorly controlled. Continue on home insulin regimen at d/c    OSA : continue on CPAP at bedtime    Hx of CAD: w/ hx of CABG. Holding home dose of lisinopril, aldactone. Restarted propranolol    HTN: continue on home dose of cardizem & restart propanolol. Holding home aldactone, lisinopril  Iron deficiency anemia: continue on iron supplement. H&H are labile   Discharge Instructions  Discharge Instructions     Diet - low sodium heart healthy   Complete by: As directed    Discharge instructions   Complete by: As directed    F/u w/ PCP in 1-2 weeks. F/u w/ nephro, Dr. Wynelle Link, in 1-2 weeks. Need BMP w/in 1-2 days to check Na level   Discharge instructions   Complete by: As directed    F/u w/ ortho surg, Dr. Martha Clan, in 10-14 days   Discharge wound care:   Complete by: As directed    Wound care  Daily      Comments: Wound care to open areas on sacrum  (partial thickness, moisture related):  Cleanse with NS, pat dry. Cover with folded layers of xeroform gauze [place] # 294), top with silicone foam. Change xeroform daily, may reuse silicone foam for up to 3 days. Change PRN for rolling of dressing edges, soiling.   Discharge wound care:   Complete by: As directed    Wound care  Daily      Comments: Wound care to open areas on sacrum (partial thickness, moisture related):  Cleanse with NS, pat dry. Cover with folded layers of xeroform gauze [place] # 294), top with silicone foam. Change xeroform daily, may reuse silicone foam for up to 3 days. Change PRN for rolling of dressing edges, soiling.   Increase activity slowly   Complete by: As directed       Allergies as of 11/05/2022       Reactions   Levaquin [levofloxacin In D5w] Shortness Of Breath   Penicillins Hives   Codeine Other (See  Comments)   Hallucinate   Lipitor [atorvastatin Calcium] Other (See Comments)   Muscle pain   Lopressor [metoprolol Tartrate] Other (See Comments)   Heart races   Potassium-containing Compounds Other (See Comments)    Facial flushing   Procardia [nifedipine] Other (See Comments)   Heart races   Sucralfate Nausea Only   Tramadol Other (See Comments)   Confusion   Allegra [fexofenadine] Rash   Naltrexone Other (See Comments)   Severe headache, nausea, "body flashes".   Sulfa Antibiotics Rash        Medication List     STOP taking these medications    NovoLIN 70/30 (70-30) 100 UNIT/ML injection Generic drug: insulin NPH-regular Human       TAKE these medications    acetaminophen 500 MG tablet Commonly known as: TYLENOL Take 500-1,000 mg by mouth every 4 (four) hours as needed for mild pain or fever.   alum & mag hydroxide-simeth 200-200-20 MG/5ML suspension Commonly known as: MAALOX/MYLANTA Take 30 mLs by mouth every 4 (four) hours as needed for indigestion or heartburn.   ascorbic acid 500 MG tablet Commonly known as: VITAMIN  C Take 1 tablet (500 mg total) by mouth daily.   B-12 2500 MCG Tabs Take 2,500 mcg by mouth daily.   cholecalciferol 25 MCG (1000 UNIT) tablet Commonly known as: VITAMIN D3 Take 1,000 Units by mouth daily.   diltiazem 120 MG 24 hr capsule Commonly known as: CARDIZEM CD Take 1 capsule (120 mg total) by mouth daily.   fenofibrate 160 MG tablet Take 160 mg by mouth daily.   gabapentin 100 MG capsule Commonly known as: NEURONTIN Take 1 capsule by mouth 3 (three) times daily.   HYDROcodone-acetaminophen 10-325 MG tablet Commonly known as: NORCO Take 1 tablet by mouth every 4 (four) hours as needed for moderate pain or severe pain.   hydrOXYzine 25 MG tablet Commonly known as: ATARAX Take 25 mg by mouth daily.   insulin regular 100 units/mL injection Commonly known as: NOVOLIN R Inject 1-10 Units into the skin daily. lunchtime   Iron 325 (65 Fe) MG Tabs Take 1 tablet by mouth daily.   lisinopril 5 MG tablet Commonly known as: ZESTRIL Take 1 tablet (5 mg total) by mouth daily. Hold this medication until you see nephro What changed: additional instructions   loratadine 10 MG tablet Commonly known as: CLARITIN Take 10 mg by mouth daily.   magnesium oxide 400 (240 Mg) MG tablet Commonly known as: MAG-OX Take 400 mg by mouth 2 (two) times daily.   multivitamin with minerals Tabs tablet Take 1 tablet by mouth at bedtime.   nitroGLYCERIN 0.4 MG SL tablet Commonly known as: NITROSTAT Place 0.4 mg under the tongue every 5 (five) minutes as needed for chest pain.   ondansetron 4 MG tablet Commonly known as: ZOFRAN Take 4 mg by mouth every 8 (eight) hours as needed for vomiting or nausea.   pantoprazole 40 MG tablet Commonly known as: PROTONIX Take 40 mg by mouth 2 (two) times daily.   polyethylene glycol powder 17 GM/SCOOP powder Commonly known as: GLYCOLAX/MIRALAX Take 17 g by mouth 2 (two) times daily.   promethazine 6.25 MG/5ML solution Commonly known as:  PHENERGAN Take 5 mLs (6.25 mg total) by mouth every 6 (six) hours as needed for up to 7 days for nausea or vomiting.   propranolol ER 60 MG 24 hr capsule Commonly known as: Inderal LA Take 1 capsule (60 mg total) by mouth daily.   senna 8.6  MG tablet Commonly known as: SENOKOT Take 1 tablet by mouth daily.   sertraline 100 MG tablet Commonly known as: ZOLOFT Take 100 mg by mouth daily.   sodium chloride 1 g tablet Take 1 tablet (1 g total) by mouth 2 (two) times daily with a meal for 5 days.   spironolactone 50 MG tablet Commonly known as: ALDACTONE Take 1 tablet (50 mg total) by mouth daily. Hold this medication until you see nephro What changed: additional instructions               Discharge Care Instructions  (From admission, onward)           Start     Ordered   11/05/22 0000  Discharge wound care:       Comments: Wound care  Daily      Comments: Wound care to open areas on sacrum (partial thickness, moisture related):  Cleanse with NS, pat dry. Cover with folded layers of xeroform gauze [place] # 294), top with silicone foam. Change xeroform daily, may reuse silicone foam for up to 3 days. Change PRN for rolling of dressing edges, soiling.   11/05/22 1157   11/05/22 0000  Discharge wound care:       Comments: Wound care  Daily      Comments: Wound care to open areas on sacrum (partial thickness, moisture related):  Cleanse with NS, pat dry. Cover with folded layers of xeroform gauze [place] # 294), top with silicone foam. Change xeroform daily, may reuse silicone foam for up to 3 days. Change PRN for rolling of dressing edges, soiling.   11/05/22 1201            Contact information for follow-up providers     Juanell Fairly, MD Follow up.   Specialty: Orthopedic Surgery Why: F/u in 10-14 days Contact information: [address] [place] [address] [phone]         Lamont Dowdy, MD Follow up.   Specialty: Nephrology Why: F/u in  1-2 weeks Contact information: [address] [place] [address] [phone]              Contact information for after-discharge care     Destination     [facility] .   Service: Skilled Nursing Contact information: [address] [place] [address] [phone]                    Allergies  Allergen Reactions   Levaquin [Levofloxacin In D5w] Shortness Of Breath   Penicillins Hives   Codeine Other (See Comments)    Hallucinate   Lipitor [Atorvastatin Calcium] Other (See Comments)    Muscle pain   Lopressor [Metoprolol Tartrate] Other (See Comments)    Heart races   Potassium-Containing Compounds Other (See Comments)     Facial flushing   Procardia [Nifedipine] Other (See Comments)    Heart races   Sucralfate Nausea Only   Tramadol Other (See Comments)    Confusion   Allegra [Fexofenadine] Rash   Naltrexone Other (See Comments)    Severe headache, nausea, "body flashes".    Sulfa Antibiotics Rash    Consultations: Nephro Ortho surg    Procedures/Studies: MR BRAIN WO CONTRAST  Result Date: 11/02/2022 CLINICAL DATA:  Larey Seat and hit head and neck, right arm tremor EXAM: MRI HEAD WITHOUT CONTRAST MRI CERVICAL SPINE WITHOUT CONTRAST TECHNIQUE: Multiplanar, multiecho pulse sequences of the brain and surrounding structures,  and cervical spine, to include the craniocervical junction and cervicothoracic junction, were obtained without intravenous contrast. COMPARISON:  12/18/2021 MRI head, 10/29/2022 CT head and cervical spine FINDINGS: MRI HEAD FINDINGS Brain: No restricted diffusion to suggest acute or subacute infarct. No acute hemorrhage, mass, mass effect, or midline shift. No hydrocephalus or extra-axial collection. Normal pituitary and craniocervical junction. No hemosiderin deposition to suggest remote hemorrhage. Normal cerebral volume for age. Vascular: Normal  arterial flow voids. Skull and upper cervical spine: Normal marrow signal. Sinuses/Orbits: Clear paranasal sinuses. No acute finding in the orbits. Status post bilateral lens replacements. Other: The mastoid air cells are well aerated. MRI CERVICAL SPINE FINDINGS Evaluation is somewhat limited by motion artifact. Alignment: No significant listhesis. Vertebrae: Status post C3-C7 corpectomy and anterior fusion. Susceptibility artifact from the hardware limits evaluation of these levels and the adjacent spinal canal. Within this limitation, no acute fracture, evidence of discitis, or suspicious osseous lesion. Congenitally short pedicles, which narrow the AP diameter of the spinal canal. Cord: Normal signal and morphology. No epidural collection or hematoma. Posterior Fossa, vertebral arteries, paraspinal tissues: Negative. Disc levels: C2-C3: No significant disc bulge. Mild spinal canal stenosis, primarily secondary to short pedicles. Mild left neural foraminal narrowing, although evaluation is limited by motion. C3-C4: Status post corpectomy. Likely mild spinal canal stenosis, although evaluation is limited by susceptibility and motion. No significant neural foraminal narrowing. C4-C5: Status post corpectomy. Likely mild spinal canal stenosis, although evaluation is limited by susceptibility and motion. No significant neural foraminal narrowing. C5-C6: Status post corpectomy. Likely mild spinal canal stenosis, although evaluation is limited by susceptibility and motion. No significant neural foraminal narrowing. C6-C7: Status post corpectomy. No spinal canal stenosis. Mild bilateral neural foraminal narrowing. C7-T1: No significant disc bulge. No spinal canal stenosis or neuroforaminal narrowing. IMPRESSION: 1. No acute intracranial process. 2. Status post C3-C7 corpectomy and anterior fusion. Susceptibility artifact from the hardware limits evaluation of these levels and the adjacent spinal canal. Within this  limitation, there is likely mild spinal canal stenosis at C3-C4, C4-C5, and C5-C6. 3. Mild neural foraminal narrowing on the left at C2-C3 and bilaterally at C6-C7. 4. No evidence of trauma in the cervical spine Electronically Signed   By: Wiliam Ke M.D.   On: 11/02/2022 13:44   MR CERVICAL SPINE WO CONTRAST  Result Date: 11/02/2022 CLINICAL DATA:  Larey Seat and hit head and neck, right arm tremor EXAM: MRI HEAD WITHOUT CONTRAST MRI CERVICAL SPINE WITHOUT CONTRAST TECHNIQUE: Multiplanar, multiecho pulse sequences of the brain and surrounding structures, and cervical spine, to include the craniocervical junction and cervicothoracic junction, were obtained without intravenous contrast. COMPARISON:  12/18/2021 MRI head, 10/29/2022 CT head and cervical spine FINDINGS: MRI HEAD FINDINGS Brain: No restricted diffusion to suggest acute or subacute infarct. No acute hemorrhage, mass, mass effect, or midline shift. No hydrocephalus or extra-axial collection. Normal pituitary and craniocervical junction. No hemosiderin deposition to suggest remote hemorrhage. Normal cerebral volume for age. Vascular: Normal arterial flow voids. Skull and upper cervical spine: Normal marrow signal. Sinuses/Orbits: Clear paranasal sinuses. No acute finding in the orbits. Status post bilateral lens replacements. Other: The mastoid air cells are well aerated. MRI CERVICAL SPINE FINDINGS Evaluation is somewhat limited by motion artifact. Alignment: No significant listhesis. Vertebrae: Status post C3-C7 corpectomy and anterior fusion. Susceptibility artifact from the hardware limits evaluation of these levels and the adjacent spinal canal. Within this limitation, no acute fracture, evidence of discitis, or suspicious osseous lesion. Congenitally short pedicles, which narrow the AP diameter  of the spinal canal. Cord: Normal signal and morphology. No epidural collection or hematoma. Posterior Fossa, vertebral arteries, paraspinal tissues:  Negative. Disc levels: C2-C3: No significant disc bulge. Mild spinal canal stenosis, primarily secondary to short pedicles. Mild left neural foraminal narrowing, although evaluation is limited by motion. C3-C4: Status post corpectomy. Likely mild spinal canal stenosis, although evaluation is limited by susceptibility and motion. No significant neural foraminal narrowing. C4-C5: Status post corpectomy. Likely mild spinal canal stenosis, although evaluation is limited by susceptibility and motion. No significant neural foraminal narrowing. C5-C6: Status post corpectomy. Likely mild spinal canal stenosis, although evaluation is limited by susceptibility and motion. No significant neural foraminal narrowing. C6-C7: Status post corpectomy. No spinal canal stenosis. Mild bilateral neural foraminal narrowing. C7-T1: No significant disc bulge. No spinal canal stenosis or neuroforaminal narrowing. IMPRESSION: 1. No acute intracranial process. 2. Status post C3-C7 corpectomy and anterior fusion. Susceptibility artifact from the hardware limits evaluation of these levels and the adjacent spinal canal. Within this limitation, there is likely mild spinal canal stenosis at C3-C4, C4-C5, and C5-C6. 3. Mild neural foraminal narrowing on the left at C2-C3 and bilaterally at C6-C7. 4. No evidence of trauma in the cervical spine Electronically Signed   By: Wiliam Ke M.D.   On: 11/02/2022 13:44   US RENAL  Result Date: 10/31/2022 CLINICAL DATA:  Acute on chronic renal insufficiency EXAM: RENAL / URINARY TRACT ULTRASOUND COMPLETE COMPARISON:  10/29/2022, 04/23/2022 FINDINGS: Right Kidney: Renal measurements: 10.1 x 3.9 x 4.0 cm = volume: 81.1 mL. Echogenicity within normal limits. No mass or hydronephrosis visualized. Left Kidney: Renal measurements: 9.6 x 4.4 by 4.7 cm = volume: 104.1 mL. Echogenicity within normal limits. No mass or hydronephrosis visualized. 6 mm echogenic focus within the mid left kidney could reflect a non  radiopaque calculus, as no specific abnormality was seen in this region on recent CT. Bladder: Appears normal for degree of bladder distention. Other: None. IMPRESSION: 1. Suspected 6 mm nonobstructing left renal calculus as above. This is likely radiolucent, as no abnormality was seen in this region on recent CT. 2. Otherwise unremarkable renal ultrasound. Electronically Signed   By: Sharlet Salina M.D.   On: 10/31/2022 16:37   CT CHEST ABDOMEN PELVIS W CONTRAST  Addendum Date: 10/29/2022   ADDENDUM REPORT: 10/29/2022 14:13 ADDENDUM: There is additionally a mildly displaced fracture of the left clavicular head. Findings discussed by telephone with Dr. Arnoldo Morale. Electronically Signed   By: Jearld Lesch M.D.   On: 10/29/2022 14:13   Result Date: 10/29/2022 CLINICAL DATA:  Fall EXAM: CT CHEST, ABDOMEN, AND PELVIS WITH CONTRAST TECHNIQUE: Multidetector CT imaging of the chest, abdomen and pelvis was performed following the standard protocol during bolus administration of intravenous contrast. RADIATION DOSE REDUCTION: This exam was performed according to the departmental dose-optimization program which includes automated exposure control, adjustment of the mA and/or kV according to patient size and/or use of iterative reconstruction technique. CONTRAST:  75mL OMNIPAQUE IOHEXOL 300 MG/ML  SOLN COMPARISON:  CT chest, 09/17/2022, CT abdomen pelvis, 11/15/2019 FINDINGS: CT CHEST FINDINGS Cardiovascular: Aortic atherosclerosis. Normal heart size. Extensive three-vessel coronary artery calcifications status post median sternotomy and CABG. No pericardial effusion. Mediastinum/Nodes: No enlarged mediastinal, hilar, or axillary lymph nodes. Thyroid gland, trachea, and esophagus demonstrate no significant findings. Lungs/Pleura: Background of very fine centrilobular nodularity, most concentrated in the lung apices. Bandlike scarring of the bilateral lung bases. No pleural effusion or pneumothorax. Musculoskeletal:  Superficial soft tissue stranding about the anterior left chest and  shoulder (series 3, image 20). Impacted fracture of the proximal left humerus. CT ABDOMEN PELVIS FINDINGS Hepatobiliary: No focal liver abnormality is seen. Status post cholecystectomy. Mild postoperative biliary dilatation. Pancreas: Unremarkable. No pancreatic ductal dilatation or surrounding inflammatory changes. Spleen: Normal in size without significant abnormality. Adrenals/Urinary Tract: Definitively benign, calcium and macroscopic fat containing left adrenal adenoma, most likely related to prior hemorrhage or infection, no further follow-up or characterization is required (series 3, image 63). Kidneys are normal, without renal calculi, solid lesion, or hydronephrosis. Bladder is unremarkable. Stomach/Bowel: Stomach is within normal limits. Appendix appears normal. No evidence of bowel wall thickening, distention, or inflammatory changes. Vascular/Lymphatic: Aortic atherosclerosis. No enlarged abdominal or pelvic lymph nodes. Reproductive: Status post hysterectomy. Other: Fat containing umbilical hernia.  No ascites. Musculoskeletal: Subacute fractures through the anterior osteophytes and anterior endplate of T11 as well as anterior osteophytes of T8-T9, as seen acutely on prior examination dated 09/17/2022 (series 8, image 63). Extensive disc degenerative disease and bridging osteophytosis throughout the thoracic spine, in keeping with DISH. Wedge deformity of L3, unchanged. IMPRESSION: 1. Impacted fracture of the proximal left humerus with overlying soft tissue stranding. 2. Subacute fractures through the anterior osteophytes and anterior endplate of T11 as well as anterior osteophytes of T8-T9, as seen acutely on prior examination dated 09/17/2022. 3. No CT evidence of acute traumatic injury to the organs of the chest, abdomen, or pelvis. 4. Background of very fine centrilobular nodularity, most concentrated in the lung apices, most  commonly seen in smoking-related respiratory bronchiolitis. 5. Coronary artery disease. Aortic Atherosclerosis (ICD10-I70.0). Electronically Signed: By: Jearld Lesch M.D. On: 10/29/2022 13:04   DG Knee Complete 4 Views Right  Result Date: 10/29/2022 CLINICAL DATA:  Trauma, fall, pain EXAM: RIGHT KNEE - COMPLETE 4+ VIEW COMPARISON:  09/13/2022 FINDINGS: No recent fracture or dislocation is seen. Degenerative changes are noted with bony spurs and meniscal cartilage calcifications in medial, lateral and patellofemoral compartments. There is no significant effusion in suprapatellar bursa. There is large dense calcification in the course of the patellar tendon with no significant interval change. IMPRESSION: No recent fracture or dislocation is seen in right knee. Degenerative changes are noted. Large linear calcification in patellar tendon has not changed significantly. Electronically Signed   By: Ernie Avena M.D.   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 No recent fracture or dislocation is seen. Osteopenia is seen in bony structures. There is no significant effusion in suprapatellar bursa. Scattered arterial calcifications are seen in soft tissues. IMPRESSION: Previous left knee arthroplasty. No recent fracture or dislocation is seen. Arteriosclerosis. Electronically Signed   By: Ernie Avena M.D.   On: 10/29/2022 13:43   CT Head Wo Contrast  Result Date: 10/29/2022 CLINICAL DATA:  Fall, hit head EXAM: CT HEAD WITHOUT CONTRAST CT CERVICAL SPINE WITHOUT CONTRAST TECHNIQUE: Multidetector CT imaging of the head and cervical spine was performed following the standard protocol without intravenous contrast. Multiplanar CT image reconstructions of the cervical spine were also generated. RADIATION DOSE REDUCTION: This exam  was performed according to the departmental dose-optimization program which includes automated exposure control, adjustment of the mA and/or kV according to patient size and/or use of iterative reconstruction technique. COMPARISON:  09/13/2022 FINDINGS: CT HEAD FINDINGS Brain: No evidence of acute infarction, hemorrhage, hydrocephalus, extra-axial collection or mass lesion/mass effect. Vascular: No  hyperdense vessel or unexpected calcification. Skull: Normal. Negative for fracture or focal lesion. Sinuses/Orbits: No acute finding. Other: None. CT CERVICAL SPINE FINDINGS Alignment: Normal. Skull base and vertebrae: No acute fracture. No primary bone lesion or focal pathologic process. Soft tissues and spinal canal: No prevertebral fluid or swelling. No visible canal hematoma. Disc levels: Corpectomy and anterior fusion of C3 through C7. Upper chest: Negative. Other: None. IMPRESSION: 1. No acute intracranial pathology. 2. No fracture or static subluxation of the cervical spine. 3. Corpectomy and anterior fusion of C3 through C7. Electronically Signed   By: Jearld Lesch M.D.   On: 10/29/2022 12:44   CT Cervical Spine Wo Contrast  Result Date: 10/29/2022 CLINICAL DATA:  Fall, hit head EXAM: CT HEAD WITHOUT CONTRAST CT CERVICAL SPINE WITHOUT CONTRAST TECHNIQUE: Multidetector CT imaging of the head and cervical spine was performed following the standard protocol without intravenous contrast. Multiplanar CT image reconstructions of the cervical spine were also generated. RADIATION DOSE REDUCTION: This exam was performed according to the departmental dose-optimization program which includes automated exposure control, adjustment of the mA and/or kV according to patient size and/or use of iterative reconstruction technique. COMPARISON:  09/13/2022 FINDINGS: CT HEAD FINDINGS Brain: No evidence of acute infarction, hemorrhage, hydrocephalus, extra-axial collection or mass lesion/mass effect. Vascular: No hyperdense  vessel or unexpected calcification. Skull: Normal. Negative for fracture or focal lesion. Sinuses/Orbits: No acute finding. Other: None. CT CERVICAL SPINE FINDINGS Alignment: Normal. Skull base and vertebrae: No acute fracture. No primary bone lesion or focal pathologic process. Soft tissues and spinal canal: No prevertebral fluid or swelling. No visible canal hematoma. Disc levels: Corpectomy and anterior fusion of C3 through C7. Upper chest: Negative. Other: None. IMPRESSION: 1. No acute intracranial pathology. 2. No fracture or static subluxation of the cervical spine. 3. Corpectomy and anterior fusion of C3 through C7. Electronically Signed   By: Jearld Lesch M.D.   On: 10/29/2022 12:44   DG Shoulder 1V Left  Result Date: 10/29/2022 CLINICAL DATA:  Left arm pain after fall. EXAM: LEFT SHOULDER COMPARISON:  None Available. FINDINGS: Moderately displaced proximal left humeral neck fracture is noted. No dislocation is seen. IMPRESSION: Moderately displaced proximal left humeral neck fracture. Electronically Signed   By: Lupita Raider M.D.   On: 10/29/2022 12:31   DG Humerus Left  Result Date: 10/29/2022 CLINICAL DATA:  Left arm pain after fall today. EXAM: LEFT HUMERUS - 2+ VIEW COMPARISON:  None Available. FINDINGS: Moderately displaced proximal left humeral neck fracture is noted. Remaining portion of left humerus is unremarkable. IMPRESSION: Moderately displaced proximal left humeral neck fracture. Electronically Signed   By: Lupita Raider M.D.   On: 10/29/2022 12:30   US Venous Img Lower Unilateral Left (DVT)  Result Date: 10/14/2022 CLINICAL DATA:  Left lower extremity edema since fall 2 weeks ago. Evaluate for DVT. EXAM: LEFT LOWER EXTREMITY VENOUS DOPPLER ULTRASOUND TECHNIQUE: Gray-scale sonography with graded compression, as well as color Doppler and duplex ultrasound were performed to evaluate the lower extremity deep venous systems from the level of the common femoral vein and including  the common femoral, femoral, profunda femoral, popliteal and calf veins including the posterior tibial, peroneal and gastrocnemius veins when visible. The superficial great saphenous vein was also interrogated. Spectral Doppler was utilized to evaluate flow at rest and with distal augmentation maneuvers in the common femoral, femoral and popliteal veins. COMPARISON:  Left lower extremity venous Doppler ultrasound-09/16/2022 (negative); 10/08/2021 (negative) FINDINGS: Contralateral Common Femoral Vein: Respiratory phasicity is  normal and symmetric with the symptomatic side. No evidence of thrombus. Normal compressibility. Common Femoral Vein: No evidence of thrombus. Normal compressibility, respiratory phasicity and response to augmentation. Saphenofemoral Junction: No evidence of thrombus. Normal compressibility and flow on color Doppler imaging. Profunda Femoral Vein: No evidence of thrombus. Normal compressibility and flow on color Doppler imaging. Femoral Vein: No evidence of thrombus. Normal compressibility, respiratory phasicity and response to augmentation. Popliteal Vein: No evidence of thrombus. Normal compressibility, respiratory phasicity and response to augmentation. Calf Veins: No evidence of thrombus. Normal compressibility and flow on color Doppler imaging. Superficial Great Saphenous Vein: No evidence of thrombus. Normal compressibility. Other Findings: There is a minimal amount of subcutaneous edema at the level of the left calf (image 37). IMPRESSION: No evidence of DVT within the left lower extremity, similar to the 09/16/2022 and 10/08/2021 examinations. Electronically Signed   By: Simonne Come M.D.   On: 10/14/2022 14:28   (Echo, Carotid, EGD, Colonoscopy, ERCP)    Subjective:   Discharge Exam: Vitals:   11/05/22 0505 11/05/22 0741  BP: (!) 145/62 (!) 152/59  Pulse: 93 95  Resp: 16 18  Temp: 98.2 F (36.8 C) 97.8 F (36.6 C)  SpO2: 95% 98%   Vitals:   11/04/22 1453 11/04/22 1845  11/05/22 0505 11/05/22 0741  BP: (!) 156/66 (!) 156/65 (!) 145/62 (!) 152/59  Pulse: 84 84 93 95  Resp: 18 18 16 18   Temp: 98.2 F (36.8 C) 98.2 F (36.8 C) 98.2 F (36.8 C) 97.8 F (36.6 C)  TempSrc: Oral     SpO2: 99% 98% 95% 98%  Weight:      Height:        General: Pt is alert, awake, not in acute distress Cardiovascular: S1/S2 +, no rubs, no gallops Respiratory: CTA bilaterally, no wheezing, no rhonchi Abdominal: Soft, NT, ND, bowel sounds + Extremities: no cyanosis    The results of significant diagnostics from this hospitalization (including imaging, microbiology, ancillary and laboratory) are listed below for reference.     Microbiology: No results found for this or any previous visit (from the past 240 hour(s)).   Labs: BNP (last 3 results) No results for input(s): "BNP" in the last 8760 hours. Basic Metabolic Panel: Recent Labs  Lab 11/01/22 0811 11/02/22 0423 11/03/22 0819 11/04/22 0509 11/05/22 0401  NA 124* 126* 126* 131* 132*  K 4.7 4.7 4.7 4.5 4.6  CL 93* 98 100 107 105  CO2 21* 18* 17* 20* 20*  GLUCOSE 331* 217* 222* 181* 206*  BUN 73* 74* 66* 46* 29*  CREATININE 4.01* 4.02* 2.78* 1.73* 1.28*  CALCIUM 8.2* 8.1* 8.1* 8.5* 8.8*   Liver Function Tests: No results for input(s): "AST", "ALT", "ALKPHOS", "BILITOT", "PROT", "ALBUMIN" in the last 168 hours.  No results for input(s): "LIPASE", "AMYLASE" in the last 168 hours. No results for input(s): "AMMONIA" in the last 168 hours. CBC: Recent Labs  Lab 10/31/22 0356 11/01/22 0811 11/02/22 0423 11/03/22 0819 11/04/22 0509 11/05/22 0401  WBC 9.5 6.2 5.1  --  3.7* 4.2  HGB 8.8* 8.4* 8.5* 7.9* 8.2* 8.0*  HCT 28.0* 26.4* 26.6*  --  25.2* 25.5*  MCV 86.7 85.7 85.0  --  84.3 85.6  PLT 161 159 172  --  202 211   Cardiac Enzymes: No results for input(s): "CKTOTAL", "CKMB", "CKMBINDEX", "TROPONINI" in the last 168 hours. BNP: Invalid input(s): "POCBNP" CBG: Recent Labs  Lab 11/04/22 1124  11/04/22 1525 11/04/22 2054 11/05/22 0802 11/05/22 1143  GLUCAP  207* 187* 216* 205* 221*   D-Dimer No results for input(s): "DDIMER" in the last 72 hours. Hgb A1c No results for input(s): "HGBA1C" in the last 72 hours. Lipid Profile No results for input(s): "CHOL", "HDL", "LDLCALC", "TRIG", "CHOLHDL", "LDLDIRECT" in the last 72 hours. Thyroid function studies No results for input(s): "TSH", "T4TOTAL", "T3FREE", "THYROIDAB" in the last 72 hours.  Invalid input(s): "FREET3" Anemia work up No results for input(s): "VITAMINB12", "FOLATE", "FERRITIN", "TIBC", "IRON", "RETICCTPCT" in the last 72 hours. Urinalysis    Component Value Date/Time   COLORURINE YELLOW (A) 10/30/2022 0726   APPEARANCEUR CLEAR (A) 10/30/2022 0726   LABSPEC 1.030 10/30/2022 0726   PHURINE 5.0 10/30/2022 0726   GLUCOSEU NEGATIVE 10/30/2022 0726   HGBUR NEGATIVE 10/30/2022 0726   BILIRUBINUR NEGATIVE 10/30/2022 0726   KETONESUR NEGATIVE 10/30/2022 0726   PROTEINUR NEGATIVE 10/30/2022 0726   NITRITE NEGATIVE 10/30/2022 0726   LEUKOCYTESUR TRACE (A) 10/30/2022 0726   Sepsis Labs Recent Labs  Lab 11/01/22 0811 11/02/22 0423 11/04/22 0509 11/05/22 0401  WBC 6.2 5.1 3.7* 4.2   Microbiology No results found for this or any previous visit (from the past 240 hour(s)).   Time coordinating discharge: Over 30 minutes  SIGNED:   Charise Killian, MD  [facility] 11/05/2022, 12:22 PM Pager   If 7PM-7AM, please contact night-coverage [email or web address]

## 2022-11-05 NOTE — TOC Progression Note (Signed)
Transition of Care [facility]) - Progression Note    Patient Details  Name: ANACAREN KOHAN MRN: [id number] Date of Birth: 1941/05/02  Transition of Care [facility]) CM/SW Contact  Kreg Shropshire, RN Phone Number: 11/05/2022, 10:53 AM  Clinical Narrative:    1053- Cm verified that [facility] her today. Pt wears Cpap occasionally. She stated she can have her aid bring the Cpap in from home to facility. Reached out to Amy from Respiratory Therapy. She stated that pt is on "Auto-Pap" setting on cpap.   Expected Discharge Plan: Skilled Nursing Facility Barriers to Discharge: Continued Medical Work up  Expected Discharge Plan and Services       Living arrangements for the past 2 months: Single Family Home (Has an aid come in x3 a week)                           HH Arranged: Charity fundraiser, PT HH Agency: [facility] Date HH Agency Contacted: 10/30/22 Time HH Agency Contacted: [phone] Representative spoke with at [facility] Agency: Feliberto Got[facility] of [facility]   Social Determinants of Health (SDOH) Interventions SDOH Screenings   Food Insecurity: No Food Insecurity (09/13/2022)  Housing: Low Risk  (09/13/2022)  Transportation Needs: No Transportation Needs (09/13/2022)  Utilities: Not At Risk (09/13/2022)  Tobacco Use: Medium Risk (10/29/2022)    Readmission Risk Interventions    09/15/2022   10:21 AM  Readmission Risk Prevention Plan  Transportation Screening Complete  PCP or Specialist Appt within 3-5 Days Complete  HRI or Home Care Consult Complete  Social Work Consult for Recovery Care Planning/Counseling Complete  Palliative Care Screening Complete  Medication Review Oceanographer) Complete

## 2022-11-05 NOTE — TOC Transition Note (Signed)
Transition of Care [facility]) - CM/SW Discharge Note   Patient Details  Name: Rhonda Tyler MRN: [id number] Date of Birth: August 11, 1940  Transition of Care [facility]) CM/SW Contact:  Kreg Shropshire, RN Phone Number: 11/05/2022, 12:40 PM   Clinical Narrative:    [facility] to[facility] Anticipated DC date: 11/05/22 Family notified: Left voicemail for sister Elenora Fender by: Non emergent ems  Per MD patient ready for DC to . RN, patient, patient's family, and facility notified of DC. Discharge Summary sent to facility. RN given number for report. DC packet on chart. Ambulance transport requested for patient.  TOC signing off.    Final next level of care: Skilled Nursing Facility Barriers to Discharge: Barriers Resolved   Patient Goals and CMS Choice      Discharge Placement                  Patient to be transferred to facility by: Non emergent ems Name of family member notified: Sister Bonita Quin Patient and family notified of of transfer: 11/05/22  Discharge Plan and Services Additional resources added to the After Visit Summary for                            [facility] Arranged: RN, PT [facility] Agency: [facility] Date HH Agency Contacted: 10/30/22 Time HH Agency Contacted: [phone] Representative spoke with at [facility] Agency: [facility]  Social Determinants of Health (SDOH) Interventions SDOH Screenings   Food Insecurity: No Food Insecurity (09/13/2022)  Housing: Low Risk  (09/13/2022)  Transportation Needs: No Transportation Needs (09/13/2022)  Utilities: Not At Risk (09/13/2022)  Tobacco Use: Medium Risk (10/29/2022)     Readmission Risk Interventions    09/15/2022   10:21 AM  Readmission Risk Prevention Plan  Transportation Screening Complete  PCP or Specialist Appt within 3-5 Days Complete  HRI or Home Care Consult Complete  Social Work Consult for Recovery Care Planning/Counseling Complete  Palliative Care Screening Complete  Medication  Review Oceanographer) Complete

## 2022-11-08 ENCOUNTER — Emergency Department
Admission: EM | Admit: 2022-11-08 | Discharge: 2022-11-10 | Disposition: A | Discharge status: Home / Self Care | Payer: Medicare Other | Attending: Emergency Medicine | Admitting: Emergency Medicine

## 2022-11-08 DIAGNOSIS — W1830XA Fall on same level, unspecified, initial encounter: Secondary | ICD-10-CM | POA: Insufficient documentation

## 2022-11-08 DIAGNOSIS — W19XXXA Unspecified fall, initial encounter: Secondary | ICD-10-CM

## 2022-11-08 DIAGNOSIS — S4992XA Unspecified injury of left shoulder and upper arm, initial encounter: Secondary | ICD-10-CM | POA: Diagnosis present

## 2022-11-08 NOTE — Discharge Instructions (Addendum)
No evidence of injury from fall on exam

## 2022-11-08 NOTE — ED Notes (Signed)
called to [facility] for transport to facility/Rhonda Tyler/rep:matelynn

## 2022-11-08 NOTE — ED Provider Notes (Signed)
   [facility] Provider Note    Event Date/Time   First MD Initiated Contact with Patient 11/08/22 1916     (approximate)   History  Fall   HPI  MOMO RICHART is a 82 y.o. female sent in for evaluation from facility after unwitnessed fall.  Patient reports that she did not actually fall but she went to sit on her chair and sat for too far forward so she slid down her chair onto the floor.  She was unable to stand up so she slid herself to the door and called for help and despite her efforts to convince staff there that she did not need to be seen in the emergency department to send her to the emergency department.  She denies injury     Physical Exam   Triage Vital Signs: ED Triage Vitals  Enc Vitals Group     BP 11/08/22 1923 (!) 146/46     Pulse Rate 11/08/22 1923 91     Resp 11/08/22 1923 17     Temp 11/08/22 1923 98.5 F (36.9 C)     Temp Source 11/08/22 1923 Oral     SpO2 11/08/22 1923 95 %     Weight 11/08/22 1926 71.7 kg (158 lb 1.1 oz)     Height 11/08/22 1926 1.6 m (5\' 3" )     Head Circumference --      Peak Flow --      Pain Score 11/08/22 1925 3     Pain Loc --      Pain Edu? --      Excl. in GC? --     Most recent vital signs: Vitals:   11/08/22 1923  BP: (!) 146/46  Pulse: 91  Resp: 17  Temp: 98.5 F (36.9 C)  SpO2: 95%     General: Awake, no distress.  CV:  Good peripheral perfusion.  Resp:  Normal effort.  Abd:  No distention.  Other:  Prior left arm injury otherwise no extremity injuries, no back pain, no vertebral tenderness to palpation, no abdominal tenderness palpation.  No chest wall tenderness palpation, no evidence of head injury   ED Results / Procedures / Treatments   Labs (all labs ordered are listed, but only abnormal results are displayed) Labs Reviewed - No data to display   EKG     RADIOLOGY     PROCEDURES:  Critical Care performed:   Procedures   MEDICATIONS ORDERED IN  ED: Medications - No data to display   IMPRESSION / MDM / ASSESSMENT AND PLAN / ED COURSE  I reviewed the triage vital signs and the nursing notes. Patient's presentation is most consistent with acute, uncomplicated illness.  Patient well-appearing without complaint.  Exam is reassuring, she has prior injury to the left arm, unchanged.        FINAL CLINICAL IMPRESSION(S) / ED DIAGNOSES   Final diagnoses:  Fall, initial encounter     Rx / DC Orders   ED Discharge Orders     None        Note:  This document was prepared using Dragon voice recognition software and may include unintentional dictation errors.   Jene Every, MD 11/08/22 2020

## 2022-12-25 ENCOUNTER — Inpatient Hospital Stay: Payer: No Typology Code available for payment source | Attending: Oncology

## 2022-12-25 DIAGNOSIS — D509 Iron deficiency anemia, unspecified: Secondary | ICD-10-CM | POA: Diagnosis present

## 2022-12-25 DIAGNOSIS — D500 Iron deficiency anemia secondary to blood loss (chronic): Secondary | ICD-10-CM

## 2022-12-25 LAB — IRON AND TIBC
Iron: 59 ug/dL (ref 28–170)
Saturation Ratios: 17 % (ref 10.4–31.8)
TIBC: 342 ug/dL (ref 250–450)
UIBC: 283 ug/dL

## 2022-12-25 LAB — CBC WITH DIFFERENTIAL/PLATELET
Abs Immature Granulocytes: 0.01 10*3/uL (ref 0.00–0.07)
Basophils Absolute: 0 10*3/uL (ref 0.0–0.1)
Basophils Relative: 1 %
Eosinophils Absolute: 0.5 10*3/uL (ref 0.0–0.5)
Eosinophils Relative: 14 %
HCT: 35.5 % — ABNORMAL LOW (ref 36.0–46.0)
Hemoglobin: 11.1 g/dL — ABNORMAL LOW (ref 12.0–15.0)
Immature Granulocytes: 0 %
Lymphocytes Relative: 24 %
Lymphs Abs: 0.8 10*3/uL (ref 0.7–4.0)
MCH: 27.5 pg (ref 26.0–34.0)
MCHC: 31.3 g/dL (ref 30.0–36.0)
MCV: 87.9 fL (ref 80.0–100.0)
Monocytes Absolute: 0.2 10*3/uL (ref 0.1–1.0)
Monocytes Relative: 7 %
Neutro Abs: 1.8 10*3/uL (ref 1.7–7.7)
Neutrophils Relative %: 54 %
Platelets: 176 10*3/uL (ref 150–400)
RBC: 4.04 MIL/uL (ref 3.87–5.11)
RDW: 15.8 % — ABNORMAL HIGH (ref 11.5–15.5)
WBC: 3.3 10*3/uL — ABNORMAL LOW (ref 4.0–10.5)
nRBC: 0 % (ref 0.0–0.2)

## 2022-12-25 LAB — FERRITIN: Ferritin: 63 ng/mL (ref 11–307)

## 2022-12-29 ENCOUNTER — Inpatient Hospital Stay: Payer: No Typology Code available for payment source

## 2022-12-29 ENCOUNTER — Inpatient Hospital Stay (HOSPITAL_BASED_OUTPATIENT_CLINIC_OR_DEPARTMENT_OTHER): Payer: No Typology Code available for payment source | Admitting: Oncology

## 2022-12-29 ENCOUNTER — Encounter: Payer: Self-pay | Admitting: Oncology

## 2022-12-29 VITALS — BP 149/68 | HR 74 | Temp 98.0°F | Resp 19

## 2022-12-29 VITALS — BP 141/62 | HR 85 | Temp 97.6°F | Resp 18 | Wt 165.0 lb

## 2022-12-29 DIAGNOSIS — D509 Iron deficiency anemia, unspecified: Secondary | ICD-10-CM

## 2022-12-29 DIAGNOSIS — D500 Iron deficiency anemia secondary to blood loss (chronic): Secondary | ICD-10-CM

## 2022-12-29 DIAGNOSIS — N1832 Chronic kidney disease, stage 3b: Secondary | ICD-10-CM

## 2022-12-29 DIAGNOSIS — K7581 Nonalcoholic steatohepatitis (NASH): Secondary | ICD-10-CM

## 2022-12-29 DIAGNOSIS — D631 Anemia in chronic kidney disease: Secondary | ICD-10-CM

## 2022-12-29 DIAGNOSIS — K7460 Unspecified cirrhosis of liver: Secondary | ICD-10-CM

## 2022-12-29 DIAGNOSIS — M7989 Other specified soft tissue disorders: Secondary | ICD-10-CM

## 2022-12-29 DIAGNOSIS — R600 Localized edema: Secondary | ICD-10-CM

## 2022-12-29 MED ORDER — SODIUM CHLORIDE 0.9 % IV SOLN
INTRAVENOUS | Status: DC
  Filled 2022-12-29: qty 250

## 2022-12-29 MED ORDER — SODIUM CHLORIDE 0.9% FLUSH
3.0000 mL | Freq: Once | INTRAVENOUS | Status: DC | PRN
  Filled 2022-12-29: qty 3

## 2022-12-29 MED ORDER — SODIUM CHLORIDE 0.9% FLUSH
10.0000 mL | Freq: Once | INTRAVENOUS | Status: DC | PRN
  Filled 2022-12-29: qty 10

## 2022-12-29 MED ORDER — SODIUM CHLORIDE 0.9 % IV SOLN
200.0000 mg | Freq: Once | INTRAVENOUS | Status: AC
  Administered 2022-12-29: 200 mg via INTRAVENOUS
  Filled 2022-12-29: qty 200

## 2022-12-29 MED ORDER — ALTEPLASE 2 MG IJ SOLR
2.0000 mg | Freq: Once | INTRAMUSCULAR | Status: DC | PRN
  Filled 2022-12-29: qty 2

## 2022-12-29 MED ORDER — SODIUM CHLORIDE 0.9 % IV SOLN
200.0000 mg | Freq: Once | INTRAVENOUS | Status: DC
  Filled 2022-12-29: qty 10

## 2022-12-29 MED ORDER — HEPARIN SOD (PORK) LOCK FLUSH 100 UNIT/ML IV SOLN
500.0000 [IU] | Freq: Once | INTRAVENOUS | Status: DC | PRN
  Filled 2022-12-29: qty 5

## 2022-12-29 MED ORDER — HEPARIN SOD (PORK) LOCK FLUSH 100 UNIT/ML IV SOLN
250.0000 [IU] | Freq: Once | INTRAVENOUS | Status: DC | PRN
  Filled 2022-12-29: qty 5

## 2022-12-29 NOTE — Assessment & Plan Note (Signed)
#  Iron deficiency anemia, Labs reviewed and discussed  Lab Results  Component Value Date   HGB 11.1 (L) 12/25/2022   TIBC 342 12/25/2022   IRONPCTSAT 17 12/25/2022   FERRITIN 63 12/25/2022    Recommend patient to proceed with IV Venofer weekly x 2

## 2022-12-29 NOTE — Assessment & Plan Note (Addendum)
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

## 2022-12-29 NOTE — Patient Instructions (Signed)
 Iron Sucrose Injection What is this medication? IRON SUCROSE (EYE ern SOO krose) treats low levels of iron (iron deficiency anemia) in people with kidney disease. Iron is a mineral that plays an important role in making red blood cells, which carry oxygen from your lungs to the rest of your body. This medicine may be used for other purposes; ask your health care provider or pharmacist if you have questions. COMMON BRAND NAME(S): Venofer What should I tell my care team before I take this medication? They need to know if you have any of these conditions: Anemia not caused by low iron levels Heart disease High levels of iron in the blood Kidney disease Liver disease An unusual or allergic reaction to iron, other medications, foods, dyes, or preservatives Pregnant or trying to get pregnant Breastfeeding How should I use this medication? This medication is for infusion into a vein. It is given in a hospital or clinic setting. Talk to your care team about the use of this medication in children. While this medication may be prescribed for children as young as 2 years for selected conditions, precautions do apply. Overdosage: If you think you have taken too much of this medicine contact a poison control center or emergency room at once. NOTE: This medicine is only for you. Do not share this medicine with others. What if I miss a dose? Keep appointments for follow-up doses. It is important not to miss your dose. Call your care team if you are unable to keep an appointment. What may interact with this medication? Do not take this medication with any of the following: Deferoxamine Dimercaprol Other iron products This medication may also interact with the following: Chloramphenicol Deferasirox This list may not describe all possible interactions. Give your health care provider a list of all the medicines, herbs, non-prescription drugs, or dietary supplements you use. Also tell them if you smoke,  drink alcohol, or use illegal drugs. Some items may interact with your medicine. What should I watch for while using this medication? Visit your care team regularly. Tell your care team if your symptoms do not start to get better or if they get worse. You may need blood work done while you are taking this medication. You may need to follow a special diet. Talk to your care team. Foods that contain iron include: whole grains/cereals, dried fruits, beans, or peas, leafy green vegetables, and organ meats (liver, kidney). What side effects may I notice from receiving this medication? Side effects that you should report to your care team as soon as possible: Allergic reactions--skin rash, itching, hives, swelling of the face, lips, tongue, or throat Low blood pressure--dizziness, feeling faint or lightheaded, blurry vision Shortness of breath Side effects that usually do not require medical attention (report to your care team if they continue or are bothersome): Flushing Headache Joint pain Muscle pain Nausea Pain, redness, or irritation at injection site This list may not describe all possible side effects. Call your doctor for medical advice about side effects. You may report side effects to FDA at 1-800-FDA-1088. Where should I keep my medication? This medication is given in a hospital or clinic. It will not be stored at home. NOTE: This sheet is a summary. It may not cover all possible information. If you have questions about this medicine, talk to your doctor, pharmacist, or health care provider.  2024 Elsevier/Gold Standard (2022-10-02 00:00:00)

## 2022-12-29 NOTE — Assessment & Plan Note (Signed)
Obtain US RLE to rule out DVT

## 2022-12-29 NOTE — Progress Notes (Signed)
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  Anemia in chronic kidney disease IV Venofer to further improve iron store, goal of ferritin about 200  Edema of right lower extremity Obtain US RLE to rule out DVT   Orders Placed This Encounter  Procedures   US Venous Img Lower Unilateral Right    Standing Status:   Future    Standing Expiration Date:   12/29/2023    Order Specific Question:   Reason for Exam (SYMPTOM  OR DIAGNOSIS REQUIRED)    Answer:   Leg Swelling    Order Specific Question:   Preferred imaging location?    Answer:   [facility]   CBC with Differential (Cancer Center Only)    Standing Status:   Future    Standing Expiration Date:   12/29/2023   Iron and TIBC    Standing Status:   Future    Standing Expiration Date:   12/29/2023   Ferritin    Standing Status:   Future    Standing Expiration Date:   12/29/2023   Retic Panel    Standing Status:   Future    Standing Expiration Date:   12/29/2023   Follow up in 6 months.  All questions were answered. The patient knows to call the clinic with any problems, questions or concerns.  Rickard Patience, MD, PhD [facility] Hematology Oncology 12/29/2022     HISTORY OF PRESENTING ILLNESS:  Rhonda Tyler is a  82 y.o.  female with PMH listed below who was referred to me for evaluation of iron deficiency anemia Reviewed  patient's recent labs that was done at [facility] office. 05/02/2018 Labs revealed anemia with hemoglobin of 9.3, MCV 74.7, wbc 6.1, platelet 343,000 Iron panel showed TIBC 602, iron saturation 9, ferritin 14.   Reviewed patient's previous labs ordered by primary care physician's office, anemia is chronic onset , duration is since April 2019 No aggravating or improving factors.  Associated signs and symptoms: Patient reports fatigue. Mild  SOB with exertion.  Denies weight loss, easy bruising, hematochezia, hemoptysis, hematuria. Context:  History of iron deficiency:  Rectal bleeding: deneis Hematemesis or hemoptysis : denies Blood in urine : denies  Last endoscopy: 03/08/2019 upper endoscopy showed many gastric polyps. 11/29/2017 The entire examined colon is normal. - No specimens collected  11/29/2017 Upper endosocpy, Multiple gastric polyps. The largest with stigmata of bleeding resected and retrieved. Clip (MR conditional) was placed. Pathology showed hyperplastic gastric polyps.  Fatigue: Yes.   She is on Aspirin 81mg  daily,   INTERVAL HISTORY Rhonda Tyler is a 82 y.o. female who has above history reviewed by me today presents for follow up visit for management of iron deficiency anemia Received IV Venofer treatments previously.  Tolerated well. + fatigue.  Right lower extremity edema, since her hospitalization.  10/29/2022 - 11/05/2022 hospitalize due to AKI on CKD, humerus fracture s/p fall.   Denies hematochezia, hematuria, hematemesis, epistaxis, black tarry  stool .      Review of Systems  Constitutional:  Positive for fatigue. Negative for appetite change, chills and fever.  HENT:   Negative for hearing loss and voice change.   Eyes:  Negative for eye problems.  Respiratory:  Negative for chest tightness and cough.   Cardiovascular:  Positive for leg swelling. Negative for chest pain.  Gastrointestinal:  Negative for abdominal distention, abdominal pain, blood in stool and  nausea.  Endocrine: Negative for hot flashes.  Genitourinary:  Negative for difficulty urinating and frequency.   Musculoskeletal:  Positive for arthralgias.  Skin:  Negative for itching and rash.  Neurological:  Negative for extremity weakness.  Hematological:  Negative for adenopathy.  Psychiatric/Behavioral:  Negative for confusion.     MEDICAL HISTORY:  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   Colon polyp    Coronary artery disease    Cutaneous sarcoidosis    Diabetes mellitus without complication (HCC)    Pt takes insulin   Diabetic neuropathy (HCC)    GERD (gastroesophageal reflux disease)    Headache    migaines in the past   Hemorrhoid    History of diabetic neuropathy    Hypercalcemia    Hyperlipemia    Hypertension    IBS (irritable bowel syndrome)    Iron deficiency anemia    Myocardial infarction (HCC)    Pneumonia    Sleep apnea    NO CPAP    SURGICAL HISTORY: Past Surgical History:  Procedure Laterality Date   ABDOMINAL HYSTERECTOMY     arthroscopic rotator cuff repair     BACK SURGERY     CARDIAC CATHETERIZATION     CATARACT EXTRACTION W/PHACO Right 01/26/2022   Procedure: CATARACT EXTRACTION PHACO AND INTRAOCULAR LENS PLACEMENT (IOC) RIGHT DIABETIC;  Surgeon: Nevada Crane, MD;  Location: [facility];  Service: Ophthalmology;  Laterality: Right;  2.90 0:29.3   CATARACT EXTRACTION W/PHACO Left 02/09/2022   Procedure: CATARACT EXTRACTION PHACO AND INTRAOCULAR LENS PLACEMENT (IOC) LEFT DIABETIC 3.54 00:32.3;  Surgeon: Nevada Crane, MD;  Location: [facility];  Service: Ophthalmology;  Laterality: Left;  Diabetic   CHOLECYSTECTOMY     COLONOSCOPY     COLONOSCOPY WITH PROPOFOL N/A 11/29/2017   Procedure: COLONOSCOPY  WITH PROPOFOL;  Surgeon: Christena Deem, MD;  Location: [facility] END[facility];  Service: Endoscopy;  Laterality: N/A;   CORONARY ANGIOPLASTY     PTCA and stent of RCA   CORONARY ARTERY BYPASS GRAFT     ESOPHAGOGASTRODUODENOSCOPY     ESOPHAGOGASTRODUODENOSCOPY (EGD) WITH PROPOFOL N/A 11/29/2017   Procedure: ESOPHAGOGASTRODUODENOSCOPY (EGD) WITH PROPOFOL;  Surgeon: Christena Deem, MD;  Location: [facility];  Service: Endoscopy;  Laterality: N/A;   ESOPHAGOGASTRODUODENOSCOPY (EGD) WITH PROPOFOL N/A 03/07/2018   Procedure: ESOPHAGOGASTRODUODENOSCOPY (EGD) WITH PROPOFOL;  Surgeon: Christena Deem, MD;  Location: [facility] END[facility]COPY;  Service: Endoscopy;  Laterality: N/A;   ESOPHAGOGASTRODUODENOSCOPY (EGD) WITH PROPOFOL N/A 08/16/2019   Procedure: ESOPHAGOGASTRODUODENOSCOPY (EGD) WITH PROPOFOL;  Surgeon: Toledo, Boykin Nearing, MD;  Location: [facility] ENDOSCOPY;  Service: Gastroenterology;  Laterality: N/A;   EXCISION OF ADNEXAL MASS     JOINT REPLACEMENT  04/2021   left total knee replacement   OOPHORECTOMY  TOTAL KNEE REVISION Left 05/07/2021   Procedure: TOTAL KNEE REVISION;  Surgeon: Donato Heinz, MD;  Location: [facility] OR[facility];  Service: Orthopedics;  Laterality: Left;   vesicular vaginal fistula      SOCIAL HISTORY: Social History   Socioeconomic History   Marital status: Widowed    Spouse name: Not on file   Number of children: Not on file   Years of education: Not on file   Highest education level: Not on file  Occupational History   Occupation: retired  Tobacco Use   Smoking status: Former    Current packs/day: 0.00    Types: Cigarettes    Quit date: 12/01/1970    Years since quitting: 52.1   Smokeless tobacco: Never  Vaping Use   Vaping status: Never Used  Substance and Sexual Activity   Alcohol use: Not Currently   Drug use: Never   Sexual activity: Not on file  Other Topics Concern   Not on file  Social History Narrative   Not on file   Social Determinants of Health    Financial Resource Strain: Not on file  Food Insecurity: No Food Insecurity (09/13/2022)   Hunger Vital Sign    Worried About Running Out of Food in the Last Year: Never true    Ran Out of Food in the Last Year: Never true  Transportation Needs: No Transportation Needs (09/13/2022)   PRAPARE - Administrator, Civil Service (Medical): No    Lack of Transportation (Non-Medical): No  Physical Activity: Not on file  Stress: Not on file  Social Connections: Unknown (09/21/2021)   [facility] from [facility]   Social Network    Social Network: Not on file  Intimate Partner Violence: Not At Risk (09/13/2022)   Humiliation, Afraid, Rape, and Kick questionnaire    Fear of Current or Ex-Partner: No    Emotionally Abused: No    Physically Abused: No    Sexually Abused: No    FAMILY HISTORY: Family History  Problem Relation Age of Onset   Hypertension Mother    Heart attack Mother    Hypertension Father    Heart attack Father    Skin cancer Father    Heart attack Sister    Breast cancer Neg Hx     ALLERGIES:  is allergic to levaquin [levofloxacin in d5w], penicillins, codeine, lipitor [atorvastatin calcium], lopressor [metoprolol tartrate], potassium-containing compounds, procardia [nifedipine], sucralfate, tramadol, allegra [fexofenadine], naltrexone, and sulfa antibiotics.  MEDICATIONS:  Current Outpatient Medications  Medication Sig Dispense Refill   acetaminophen (TYLENOL) 500 MG tablet Take 500-1,000 mg by mouth every 4 (four) hours as needed for mild pain or fever.     ascorbic acid (VITAMIN C) 500 MG tablet Take 1 tablet (500 mg total) by mouth daily. 30 tablet 0   cholecalciferol (VITAMIN D3) 25 MCG (1000 UNIT) tablet Take 1,000 Units by mouth daily.     Cyanocobalamin (B-12) 2500 MCG TABS Take 2,500 mcg by mouth daily.     diltiazem (CARDIZEM CD) 120 MG 24 hr capsule Take 1 capsule (120 mg total) by mouth daily. 30 capsule 0   fenofibrate 160 MG tablet  Take 160 mg by mouth daily.     Ferrous Sulfate (IRON) 325 (65 Fe) MG TABS Take 1 tablet by mouth daily.     gabapentin (NEURONTIN) 100 MG capsule Take 1 capsule by mouth 3 (three) times daily.     hydrOXYzine (ATARAX) 25 MG tablet Take 25 mg by mouth daily.  insulin regular (NOVOLIN R) 100 units/mL injection Inject 1-10 Units into the skin daily. lunchtime     loratadine (CLARITIN) 10 MG tablet Take 10 mg by mouth daily.     magnesium oxide (MAG-OX) 400 (240 Mg) MG tablet Take 400 mg by mouth 2 (two) times daily.     Multiple Vitamin (MULTIVITAMIN WITH MINERALS) TABS tablet Take 1 tablet by mouth at bedtime.     nitroGLYCERIN (NITROSTAT) 0.4 MG SL tablet Place 0.4 mg under the tongue every 5 (five) minutes as needed for chest pain.     ondansetron (ZOFRAN) 4 MG tablet Take 4 mg by mouth every 8 (eight) hours as needed for vomiting or nausea.     pantoprazole (PROTONIX) 40 MG tablet Take 40 mg by mouth 2 (two) times daily.     polyethylene glycol powder (GLYCOLAX/MIRALAX) 17 GM/SCOOP powder Take 17 g by mouth 2 (two) times daily. 238 g 0   promethazine (PHENERGAN) 6.25 MG/5ML solution Take 5 mLs (6.25 mg total) by mouth every 6 (six) hours as needed for up to 7 days for nausea or vomiting. 140 mL 0   propranolol ER (INDERAL LA) 60 MG 24 hr capsule Take 1 capsule (60 mg total) by mouth daily. 30 capsule 0   senna (SENOKOT) 8.6 MG tablet Take 1 tablet by mouth daily.     sertraline (ZOLOFT) 100 MG tablet Take 100 mg by mouth daily.     spironolactone (ALDACTONE) 50 MG tablet Take 1 tablet (50 mg total) by mouth daily. Hold this medication until you see nephro     alum & mag hydroxide-simeth (MAALOX/MYLANTA) 200-200-20 MG/5ML suspension Take 30 mLs by mouth every 4 (four) hours as needed for indigestion or heartburn. 355 mL 0   lisinopril (ZESTRIL) 5 MG tablet Take 1 tablet (5 mg total) by mouth daily. Hold this medication until you see nephro (Patient not taking: Reported on 12/29/2022)     No  current facility-administered medications for this visit.   Facility-Administered Medications Ordered in Other Visits  Medication Dose Route Frequency Provider Last Rate Last Admin   0.9 %  sodium chloride infusion   Intravenous Continuous Rickard Patience, MD   Stopped at 12/29/22 1536   alteplase (CATHFLO ACTIVASE) injection 2 mg  2 mg Intracatheter Once PRN Rickard Patience, MD       heparin lock flush 100 unit/mL  500 Units Intracatheter Once PRN Rickard Patience, MD       heparin lock flush 100 unit/mL  250 Units Intracatheter Once PRN Rickard Patience, MD       sodium chloride flush (NS) 0.9 % injection 10 mL  10 mL Intracatheter Once PRN Rickard Patience, MD       sodium chloride flush (NS) 0.9 % injection 3 mL  3 mL Intracatheter Once PRN Rickard Patience, MD         PHYSICAL EXAMINATION: ECOG PERFORMANCE STATUS: 1 - Symptomatic but completely ambulatory Vitals:   12/29/22 1322  BP: (!) 141/62  Pulse: 85  Resp: 18  Temp: 97.6 F (36.4 C)  SpO2: 100%   Filed Weights   12/29/22 1322  Weight: 165 lb (74.8 kg)    Physical Exam Constitutional:      General: She is not in acute distress. HENT:     Head: Normocephalic and atraumatic.  Eyes:     General: No scleral icterus. Cardiovascular:     Rate and Rhythm: Normal rate and regular rhythm.     Heart sounds: Normal heart sounds.  Pulmonary:     Effort: Pulmonary effort is normal. No respiratory distress.  Abdominal:     General: Bowel sounds are normal. There is no distension.     Palpations: Abdomen is soft.  Musculoskeletal:        General: No deformity. Normal range of motion.     Cervical back: Normal range of motion and neck supple.     Right lower leg: Edema present.  Skin:    General: Skin is warm and dry.     Findings: No erythema or rash.  Neurological:     Mental Status: She is alert and oriented to person, place, and time.     Cranial Nerves: No cranial nerve deficit.  Psychiatric:        Mood and Affect: Mood normal.            LABORATORY DATA:  I have reviewed the data as listed    Latest Ref Rng & Units 12/25/2022   10:39 AM 11/05/2022    4:01 AM 11/04/2022    5:09 AM  CBC  WBC 4.0 - 10.5 K/uL 3.3  4.2  3.7   Hemoglobin 12.0 - 15.0 g/dL 09.8  8.0  8.2   Hematocrit 36.0 - 46.0 % 35.5  25.5  25.2   Platelets 150 - 400 K/uL 176  211  202       Latest Ref Rng & Units 11/05/2022    4:01 AM 11/04/2022    5:09 AM 11/03/2022    8:19 AM  CMP  Glucose 70 - 99 mg/dL 119  147  829   BUN 8 - 23 mg/dL 29  46  66   Creatinine 0.44 - 1.00 mg/dL 5.62  1.30  8.65   Sodium 135 - 145 mmol/L 132  131  126   Potassium 3.5 - 5.1 mmol/L 4.6  4.5  4.7   Chloride 98 - 111 mmol/L 105  107  100   CO2 22 - 32 mmol/L 20  20  17    Calcium 8.9 - 10.3 mg/dL 8.8  8.5  8.1        Component Value Date/Time   IRON 59 12/25/2022 1039   TIBC 342 12/25/2022 1039   FERRITIN 63 12/25/2022 1039   IRONPCTSAT 17 12/25/2022 1039     No results found.

## 2022-12-29 NOTE — Assessment & Plan Note (Signed)
IV Venofer to further improve iron store, goal of ferritin about 200

## 2022-12-31 ENCOUNTER — Ambulatory Visit
Admission: RE | Admit: 2022-12-31 | Discharge: 2022-12-31 | Disposition: A | Discharge status: Home / Self Care | Payer: No Typology Code available for payment source | Source: Ambulatory Visit | Attending: Oncology | Admitting: Oncology

## 2022-12-31 DIAGNOSIS — M7989 Other specified soft tissue disorders: Secondary | ICD-10-CM | POA: Insufficient documentation

## 2023-01-05 ENCOUNTER — Inpatient Hospital Stay: Payer: No Typology Code available for payment source

## 2023-02-10 ENCOUNTER — Other Ambulatory Visit
Admission: RE | Admit: 2023-02-10 | Discharge: 2023-02-10 | Disposition: A | Discharge status: Home / Self Care | Payer: Medicare Other | Source: Ambulatory Visit | Attending: Student | Admitting: Student

## 2023-02-10 DIAGNOSIS — I5032 Chronic diastolic (congestive) heart failure: Secondary | ICD-10-CM | POA: Diagnosis present

## 2023-02-10 DIAGNOSIS — R600 Localized edema: Secondary | ICD-10-CM | POA: Insufficient documentation

## 2023-02-10 LAB — BRAIN NATRIURETIC PEPTIDE: B Natriuretic Peptide: 128.6 pg/mL — ABNORMAL HIGH (ref 0.0–100.0)

## 2023-03-03 ENCOUNTER — Encounter: Payer: Self-pay | Admitting: Oncology

## 2023-04-23 ENCOUNTER — Inpatient Hospital Stay: Payer: Medicare Other

## 2023-04-26 ENCOUNTER — Inpatient Hospital Stay: Payer: Medicare Other | Attending: Oncology

## 2023-04-26 DIAGNOSIS — Z79899 Other long term (current) drug therapy: Secondary | ICD-10-CM | POA: Insufficient documentation

## 2023-04-26 DIAGNOSIS — D509 Iron deficiency anemia, unspecified: Secondary | ICD-10-CM | POA: Diagnosis present

## 2023-04-26 DIAGNOSIS — D500 Iron deficiency anemia secondary to blood loss (chronic): Secondary | ICD-10-CM

## 2023-04-26 LAB — CBC WITH DIFFERENTIAL (CANCER CENTER ONLY)
Abs Immature Granulocytes: 0.04 10*3/uL (ref 0.00–0.07)
Basophils Absolute: 0 10*3/uL (ref 0.0–0.1)
Basophils Relative: 1 %
Eosinophils Absolute: 0.1 10*3/uL (ref 0.0–0.5)
Eosinophils Relative: 2 %
HCT: 33.2 % — ABNORMAL LOW (ref 36.0–46.0)
Hemoglobin: 11.1 g/dL — ABNORMAL LOW (ref 12.0–15.0)
Immature Granulocytes: 1 %
Lymphocytes Relative: 24 %
Lymphs Abs: 0.8 10*3/uL (ref 0.7–4.0)
MCH: 29.8 pg (ref 26.0–34.0)
MCHC: 33.4 g/dL (ref 30.0–36.0)
MCV: 89 fL (ref 80.0–100.0)
Monocytes Absolute: 0.3 10*3/uL (ref 0.1–1.0)
Monocytes Relative: 8 %
Neutro Abs: 2.1 10*3/uL (ref 1.7–7.7)
Neutrophils Relative %: 64 %
Platelet Count: 165 10*3/uL (ref 150–400)
RBC: 3.73 MIL/uL — ABNORMAL LOW (ref 3.87–5.11)
RDW: 13.9 % (ref 11.5–15.5)
WBC Count: 3.4 10*3/uL — ABNORMAL LOW (ref 4.0–10.5)
nRBC: 0 % (ref 0.0–0.2)

## 2023-04-26 LAB — IRON AND TIBC
Iron: 64 ug/dL (ref 28–170)
Saturation Ratios: 17 % (ref 10.4–31.8)
TIBC: 368 ug/dL (ref 250–450)
UIBC: 304 ug/dL

## 2023-04-26 LAB — RETIC PANEL
Immature Retic Fract: 5.1 % (ref 2.3–15.9)
RBC.: 3.73 MIL/uL — ABNORMAL LOW (ref 3.87–5.11)
Retic Count, Absolute: 77.2 10*3/uL (ref 19.0–186.0)
Retic Ct Pct: 2.1 % (ref 0.4–3.1)
Reticulocyte Hemoglobin: 32.9 pg (ref >27.9)

## 2023-04-26 LAB — FERRITIN: Ferritin: 53 ng/mL (ref 11–307)

## 2023-04-27 ENCOUNTER — Inpatient Hospital Stay: Payer: Medicare Other

## 2023-04-27 ENCOUNTER — Inpatient Hospital Stay (HOSPITAL_BASED_OUTPATIENT_CLINIC_OR_DEPARTMENT_OTHER): Payer: Medicare Other | Admitting: Oncology

## 2023-04-27 ENCOUNTER — Encounter: Payer: Self-pay | Admitting: Oncology

## 2023-04-27 VITALS — BP 117/77 | HR 73 | Temp 98.8°F | Resp 18 | Wt 150.5 lb

## 2023-04-27 VITALS — BP 125/47 | HR 65

## 2023-04-27 DIAGNOSIS — K7581 Nonalcoholic steatohepatitis (NASH): Secondary | ICD-10-CM

## 2023-04-27 DIAGNOSIS — D631 Anemia in chronic kidney disease: Secondary | ICD-10-CM

## 2023-04-27 DIAGNOSIS — K7460 Unspecified cirrhosis of liver: Secondary | ICD-10-CM

## 2023-04-27 DIAGNOSIS — R600 Localized edema: Secondary | ICD-10-CM

## 2023-04-27 DIAGNOSIS — D509 Iron deficiency anemia, unspecified: Secondary | ICD-10-CM | POA: Diagnosis not present

## 2023-04-27 DIAGNOSIS — N1832 Chronic kidney disease, stage 3b: Secondary | ICD-10-CM | POA: Diagnosis not present

## 2023-04-27 DIAGNOSIS — D500 Iron deficiency anemia secondary to blood loss (chronic): Secondary | ICD-10-CM | POA: Diagnosis not present

## 2023-04-27 MED ORDER — IRON 325 (65 FE) MG PO TABS: 1.0000 | ORAL_TABLET | Freq: Every day | ORAL | 1 refills | Status: AC

## 2023-04-27 MED ORDER — IRON SUCROSE 20 MG/ML IV SOLN
200.0000 mg | Freq: Once | INTRAVENOUS | Status: AC
Start: 2023-04-27 — End: 2023-04-27
  Administered 2023-04-27: 200 mg via INTRAVENOUS
  Filled 2023-04-27: qty 10

## 2023-04-27 NOTE — Progress Notes (Signed)
Hematology/Oncology Progress note [phone] [phone]     CHIEF COMPLAINTS/REASON FOR VISIT:  iron deficiency anemia, anemia of CKD   ASSESSMENT & PLAN:   Iron deficiency anemia #Iron deficiency anemia, Labs reviewed and discussed  Lab Results  Component Value Date   HGB 11.1 (L) 04/26/2023   TIBC 368 04/26/2023   IRONPCTSAT 17 04/26/2023   FERRITIN 53 04/26/2023    proceed with IV Venofer weekly x 1 to further increase iron store.    Anemia in chronic kidney disease IV Venofer to further improve iron store, goal of ferritin about 200 Recommend patient to take oral ferrous sulfate 325mg  daily  Edema of right lower extremity Negative DVT on [place] RLE  Recommend leg elevation.   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   Orders Placed This Encounter  Procedures   CBC with Differential (Cancer Center Only)    Standing Status:   Future    Expected Date:   08/26/2023    Expiration Date:   04/26/2024   Iron and TIBC    Standing Status:   Future    Expected Date:   08/26/2023    Expiration Date:   04/26/2024   Ferritin    Standing Status:   Future    Expected Date:   08/26/2023    Expiration Date:   04/26/2024   Retic Panel    Standing Status:   Future    Expected Date:   08/26/2023    Expiration Date:   04/26/2024   Follow up in 6 months.  All questions were answered. The patient knows to call the clinic with any problems, questions or concerns.  Rickard Patience, MD, PhD [facility] Hematology Oncology 04/27/2023     HISTORY OF PRESENTING ILLNESS:  Rhonda Tyler is a  82 y.o.  female with PMH listed below who was referred to me for evaluation of iron deficiency anemia Reviewed patient's recent labs that was done at [facility] office. 05/02/2018 Labs revealed anemia with hemoglobin of 9.3, MCV  74.7, wbc 6.1, platelet 343,000 Iron panel showed TIBC 602, iron saturation 9, ferritin 14.   Reviewed patient's previous labs ordered by primary care physician's office, anemia is chronic onset , duration is since April 2019 No aggravating or improving factors.  Associated signs and symptoms: Patient reports fatigue. Mild  SOB with exertion.  Denies weight loss, easy bruising, hematochezia, hemoptysis, hematuria. Context:  History of iron deficiency:  Rectal bleeding: deneis Hematemesis or hemoptysis : denies Blood in urine : denies  Last endoscopy: 03/08/2019 upper endoscopy showed many gastric polyps. 11/29/2017 The entire examined colon is normal. - No specimens collected  11/29/2017 Upper endosocpy, Multiple gastric polyps. The largest with stigmata of bleeding resected and retrieved. Clip (MR conditional) was placed. Pathology showed hyperplastic gastric polyps.  Fatigue: Yes.   She is on Aspirin 81mg  daily,   INTERVAL HISTORY Rhonda Tyler is a 82 y.o. female who has above history reviewed by me today presents for follow up visit for management of iron deficiency anemia Received IV Venofer treatments previously.  Tolerated well. + fatigue.  Right lower extremity edema, since her hospitalization.  10/29/2022 - 11/05/2022 hospitalize due to AKI on CKD, humerus fracture s/p fall.   Denies hematochezia, hematuria, hematemesis, epistaxis, black tarry stool .      Review of Systems  Constitutional:  Positive for fatigue. Negative for appetite change, chills  and fever.  HENT:   Negative for hearing loss and voice change.   Eyes:  Negative for eye problems.  Respiratory:  Negative for chest tightness and cough.   Cardiovascular:  Positive for leg swelling. Negative for chest pain.  Gastrointestinal:  Negative for abdominal distention, abdominal pain, blood in stool and nausea.  Endocrine: Negative for hot flashes.  Genitourinary:  Negative for difficulty urinating and frequency.    Musculoskeletal:  Positive for arthralgias.  Skin:  Negative for itching and rash.  Neurological:  Negative for extremity weakness.  Hematological:  Negative for adenopathy.  Psychiatric/Behavioral:  Negative for confusion.     MEDICAL HISTORY:  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   Colon polyp    Coronary artery disease    Cutaneous sarcoidosis    Diabetes mellitus without complication (HCC)    Pt takes insulin   Diabetic neuropathy (HCC)    GERD (gastroesophageal reflux disease)    Headache    migaines in the past   Hemorrhoid    History of diabetic neuropathy    Hypercalcemia    Hyperlipemia    Hypertension    IBS (irritable bowel syndrome)    Iron deficiency anemia    Myocardial infarction (HCC)    Pneumonia    Sleep apnea    NO CPAP    SURGICAL HISTORY: Past Surgical History:  Procedure Laterality Date   ABDOMINAL HYSTERECTOMY     arthroscopic rotator cuff repair     BACK SURGERY     CARDIAC CATHETERIZATION     CATARACT EXTRACTION W/PHACO Right 01/26/2022   Procedure: CATARACT EXTRACTION PHACO AND INTRAOCULAR LENS PLACEMENT (IOC) RIGHT DIABETIC;  Surgeon: Nevada Crane, MD;  Location: [facility];  Service: Ophthalmology;  Laterality: Right;  2.90 0:29.3   CATARACT EXTRACTION W/PHACO Left 02/09/2022   Procedure: CATARACT EXTRACTION PHACO AND INTRAOCULAR LENS PLACEMENT (IOC) LEFT DIABETIC 3.54 00:32.3;  Surgeon: Nevada Crane, MD;  Location: [facility];  Service: Ophthalmology;  Laterality: Left;  Diabetic   CHOLECYSTECTOMY     COLONOSCOPY     COLONOSCOPY WITH PROPOFOL N/A 11/29/2017   Procedure: COLONOSCOPY WITH PROPOFOL;  Surgeon: Christena Deem, MD;  Location: [facility];  Service: Endoscopy;  Laterality: N/A;    CORONARY ANGIOPLASTY     PTCA and stent of RCA   CORONARY ARTERY BYPASS GRAFT     ESOPHAGOGASTRODUODENOSCOPY     ESOPHAGOGASTRODUODENOSCOPY (EGD) WITH PROPOFOL N/A 11/29/2017   Procedure: ESOPHAGOGASTRODUODENOSCOPY (EGD) WITH PROPOFOL;  Surgeon: Christena Deem, MD;  Location: [facility];  Service: Endoscopy;  Laterality: N/A;   ESOPHAGOGASTRODUODENOSCOPY (EGD) WITH PROPOFOL N/A 03/07/2018   Procedure: ESOPHAGOGASTRODUODENOSCOPY (EGD) WITH PROPOFOL;  Surgeon: Christena Deem, MD;  Location: [facility] ENDOSCOPY;  Service: Endoscopy;  Laterality: N/A;   ESOPHAGOGASTRODUODENOSCOPY (EGD) WITH PROPOFOL N/A 08/16/2019   Procedure: ESOPHAGOGASTRODUODENOSCOPY (EGD) WITH PROPOFOL;  Surgeon: Toledo, Boykin Nearing, MD;  Location: [facility] ENDOSCOPY;  Service: Gastroenterology;  Laterality: N/A;   EXCISION OF ADNEXAL MASS     JOINT REPLACEMENT  04/2021   left total knee replacement   OOPHORECTOMY     TOTAL KNEE REVISION Left 05/07/2021   Procedure: TOTAL KNEE REVISION;  Surgeon: Donato Heinz, MD;  Location:  [facility];  Service: Orthopedics;  Laterality: Left;   vesicular vaginal fistula      SOCIAL HISTORY: Social History   Socioeconomic History   Marital status: Widowed    Spouse name: Not on file   Number of children: Not on file   Years of education: Not on file   Highest education level: Not on file  Occupational History   Occupation: retired  Tobacco Use   Smoking status: Former    Current packs/day: 0.00    Types: Cigarettes    Quit date: 12/01/1970    Years since quitting: 52.4   Smokeless tobacco: Never  Vaping Use   Vaping status: Never Used  Substance and Sexual Activity   Alcohol use: Not Currently   Drug use: Never   Sexual activity: Not on file  Other Topics Concern   Not on file  Social History Narrative   Not on file   Social Drivers of Health   Financial Resource Strain: Not on file  Food Insecurity: No Food Insecurity (09/13/2022)   Hunger Vital Sign    Worried  About Running Out of Food in the Last Year: Never true    Ran Out of Food in the Last Year: Never true  Transportation Needs: No Transportation Needs (09/13/2022)   PRAPARE - Administrator, Civil Service (Medical): No    Lack of Transportation (Non-Medical): No  Physical Activity: Not on file  Stress: Not on file  Social Connections: Unknown (09/21/2021)   Received from [facility]   Social Network    Social Network: Not on file  Intimate Partner Violence: Not At Risk (09/13/2022)   Humiliation, Afraid, Rape, and Kick questionnaire    Fear of Current or Ex-Partner: No    Emotionally Abused: No    Physically Abused: No    Sexually Abused: No    FAMILY HISTORY: Family History  Problem Relation Age of Onset   Hypertension Mother    Heart attack Mother    Hypertension Father    Heart attack Father    Skin cancer Father    Heart attack Sister    Breast cancer Neg Hx     ALLERGIES:  is allergic to levaquin [levofloxacin in d5w], penicillins, codeine, lipitor [atorvastatin calcium], lopressor [metoprolol tartrate], potassium-containing compounds, procardia [nifedipine], sucralfate, tramadol, allegra [fexofenadine], naltrexone, and sulfa antibiotics.  MEDICATIONS:  Current Outpatient Medications  Medication Sig Dispense Refill   acetaminophen (TYLENOL) 500 MG tablet Take 500-1,000 mg by mouth every 4 (four) hours as needed for mild pain or fever.     alum & mag hydroxide-simeth (MAALOX/MYLANTA) 200-200-20 MG/5ML suspension Take 30 mLs by mouth every 4 (four) hours as needed for indigestion or heartburn. 355 mL 0   ascorbic acid (VITAMIN C) 500 MG tablet Take 1 tablet (500 mg total) by mouth daily. 30 tablet 0   cholecalciferol (VITAMIN D3) 25 MCG (1000 UNIT) tablet Take 1,000 Units by mouth daily.     Cyanocobalamin (B-12) 2500 MCG TABS Take 2,500 mcg by mouth daily.     diltiazem (CARDIZEM CD) 120 MG 24 hr capsule Take 1 capsule (120 mg total) by mouth  daily. 30 capsule 0   fenofibrate 160 MG tablet Take 160 mg by mouth daily.     gabapentin (NEURONTIN) 100 MG capsule Take 1 capsule by mouth 3 (three) times daily.     hydrOXYzine (ATARAX) 25 MG tablet Take 25 mg by mouth daily.     insulin regular (NOVOLIN R) 100  units/mL injection Inject 1-10 Units into the skin daily. lunchtime     lisinopril (ZESTRIL) 5 MG tablet Take 1 tablet (5 mg total) by mouth daily. Hold this medication until you see nephro     loratadine (CLARITIN) 10 MG tablet Take 10 mg by mouth daily.     magnesium oxide (MAG-OX) 400 (240 Mg) MG tablet Take 400 mg by mouth 2 (two) times daily.     Multiple Vitamin (MULTIVITAMIN WITH MINERALS) TABS tablet Take 1 tablet by mouth at bedtime.     ondansetron (ZOFRAN) 4 MG tablet Take 4 mg by mouth every 8 (eight) hours as needed for vomiting or nausea.     pantoprazole (PROTONIX) 40 MG tablet Take 40 mg by mouth 2 (two) times daily.     polyethylene glycol powder (GLYCOLAX/MIRALAX) 17 GM/SCOOP powder Take 17 g by mouth 2 (two) times daily. 238 g 0   promethazine (PHENERGAN) 6.25 MG/5ML solution Take 5 mLs (6.25 mg total) by mouth every 6 (six) hours as needed for up to 7 days for nausea or vomiting. 140 mL 0   propranolol ER (INDERAL LA) 60 MG 24 hr capsule Take 1 capsule (60 mg total) by mouth daily. 30 capsule 0   senna (SENOKOT) 8.6 MG tablet Take 1 tablet by mouth daily.     sertraline (ZOLOFT) 100 MG tablet Take 100 mg by mouth daily.     spironolactone (ALDACTONE) 50 MG tablet Take 1 tablet (50 mg total) by mouth daily. Hold this medication until you see nephro     Ferrous Sulfate (IRON) 325 (65 Fe) MG TABS Take 1 tablet (325 mg total) by mouth daily. 90 tablet 1   nitroGLYCERIN (NITROSTAT) 0.4 MG SL tablet Place 0.4 mg under the tongue every 5 (five) minutes as needed for chest pain. (Patient not taking: Reported on 04/27/2023)     No current facility-administered medications for this visit.     PHYSICAL EXAMINATION: ECOG  PERFORMANCE STATUS: 1 - Symptomatic but completely ambulatory Vitals:   04/27/23 1319  BP: 117/77  Pulse: 73  Resp: 18  Temp: 98.8 F (37.1 C)   Filed Weights   04/27/23 1319  Weight: 150 lb 8 oz (68.3 kg)    Physical Exam Constitutional:      General: She is not in acute distress. HENT:     Head: Normocephalic and atraumatic.  Eyes:     General: No scleral icterus. Cardiovascular:     Rate and Rhythm: Normal rate and regular rhythm.     Heart sounds: Normal heart sounds.  Pulmonary:     Effort: Pulmonary effort is normal. No respiratory distress.  Abdominal:     General: Bowel sounds are normal. There is no distension.     Palpations: Abdomen is soft.  Musculoskeletal:        General: No deformity. Normal range of motion.     Cervical back: Normal range of motion and neck supple.     Right lower leg: Edema present.  Skin:    General: Skin is warm and dry.     Findings: No erythema or rash.  Neurological:     Mental Status: She is alert and oriented to person, place, and time.     Cranial Nerves: No cranial nerve deficit.  Psychiatric:        Mood and Affect: Mood normal.           LABORATORY DATA:  I have reviewed the data as listed    Latest Ref  Rng & Units 04/26/2023   11:07 AM 12/25/2022   10:39 AM 11/05/2022    4:01 AM  CBC  WBC 4.0 - 10.5 K/uL 3.4  3.3  4.2   Hemoglobin 12.0 - 15.0 g/dL 96.2  95.2  8.0   Hematocrit 36.0 - 46.0 % 33.2  35.5  25.5   Platelets 150 - 400 K/uL 165  176  211       Latest Ref Rng & Units 11/05/2022    4:01 AM 11/04/2022    5:09 AM 11/03/2022    8:19 AM  CMP  Glucose 70 - 99 mg/dL 841  324  401   BUN 8 - 23 mg/dL 29  46  66   Creatinine 0.44 - 1.00 mg/dL 0.27  2.53  6.64   Sodium 135 - 145 mmol/L 132  131  126   Potassium 3.5 - 5.1 mmol/L 4.6  4.5  4.7   Chloride 98 - 111 mmol/L 105  107  100   CO2 22 - 32 mmol/L 20  20  17    Calcium 8.9 - 10.3 mg/dL 8.8  8.5  8.1        Component Value Date/Time   IRON 64  04/26/2023 1107   TIBC 368 04/26/2023 1107   FERRITIN 53 04/26/2023 1107   IRONPCTSAT 17 04/26/2023 1107     No results found.

## 2023-04-27 NOTE — Assessment & Plan Note (Signed)
Negative DVT on [place] RLE  Recommend leg elevation.

## 2023-04-27 NOTE — Assessment & Plan Note (Addendum)
IV Venofer to further improve iron store, goal of ferritin about 200 Recommend patient to take oral ferrous sulfate 325mg  daily

## 2023-04-27 NOTE — Assessment & Plan Note (Addendum)
#  Iron deficiency anemia, Labs reviewed and discussed  Lab Results  Component Value Date   HGB 11.1 (L) 04/26/2023   TIBC 368 04/26/2023   IRONPCTSAT 17 04/26/2023   FERRITIN 53 04/26/2023    proceed with IV Venofer weekly x 1 to further increase iron store.

## 2023-04-27 NOTE — Assessment & Plan Note (Signed)
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

## 2023-04-27 NOTE — Patient Instructions (Signed)
Iron Sucrose Injection What is this medication? IRON SUCROSE (EYE ern SOO krose) treats low levels of iron (iron deficiency anemia) in people with kidney disease. Iron is a mineral that plays an important role in making red blood cells, which carry oxygen from your lungs to the rest of your body. This medicine may be used for other purposes; ask your health care provider or pharmacist if you have questions. COMMON BRAND NAME(S): Venofer What should I tell my care team before I take this medication? They need to know if you have any of these conditions: Anemia not caused by low iron levels Heart disease High levels of iron in the blood Kidney disease Liver disease An unusual or allergic reaction to iron, other medications, foods, dyes, or preservatives Pregnant or trying to get pregnant Breastfeeding How should I use this medication? This medication is for infusion into a vein. It is given in a hospital or clinic setting. Talk to your care team about the use of this medication in children. While this medication may be prescribed for children as young as 2 years for selected conditions, precautions do apply. Overdosage: If you think you have taken too much of this medicine contact a poison control center or emergency room at once. NOTE: This medicine is only for you. Do not share this medicine with others. What if I miss a dose? Keep appointments for follow-up doses. It is important not to miss your dose. Call your care team if you are unable to keep an appointment. What may interact with this medication? Do not take this medication with any of the following: Deferoxamine Dimercaprol Other iron products This medication may also interact with the following: Chloramphenicol Deferasirox This list may not describe all possible interactions. Give your health care provider a list of all the medicines, herbs, non-prescription drugs, or dietary supplements you use. Also tell them if you smoke,  drink alcohol, or use illegal drugs. Some items may interact with your medicine. What should I watch for while using this medication? Visit your care team regularly. Tell your care team if your symptoms do not start to get better or if they get worse. You may need blood work done while you are taking this medication. You may need to follow a special diet. Talk to your care team. Foods that contain iron include: whole grains/cereals, dried fruits, beans, or peas, leafy green vegetables, and organ meats (liver, kidney). What side effects may I notice from receiving this medication? Side effects that you should report to your care team as soon as possible: Allergic reactions--skin rash, itching, hives, swelling of the face, lips, tongue, or throat Low blood pressure--dizziness, feeling faint or lightheaded, blurry vision Shortness of breath Side effects that usually do not require medical attention (report to your care team if they continue or are bothersome): Flushing Headache Joint pain Muscle pain Nausea Pain, redness, or irritation at injection site This list may not describe all possible side effects. Call your doctor for medical advice about side effects. You may report side effects to FDA at 1-800-FDA-1088. Where should I keep my medication? This medication is given in a hospital or clinic. It will not be stored at home. NOTE: This sheet is a summary. It may not cover all possible information. If you have questions about this medicine, talk to your doctor, pharmacist, or health care provider.  2024 Elsevier/Gold Standard (2022-10-02 00:00:00)

## 2023-06-04 ENCOUNTER — Emergency Department: Payer: Medicare Other

## 2023-06-04 ENCOUNTER — Other Ambulatory Visit: Payer: Self-pay

## 2023-06-04 ENCOUNTER — Emergency Department
Admission: EM | Admit: 2023-06-04 | Discharge: 2023-06-05 | Disposition: A | Discharge status: Home / Self Care | Payer: Medicare Other | Attending: Emergency Medicine | Admitting: Emergency Medicine

## 2023-06-04 DIAGNOSIS — S299XXA Unspecified injury of thorax, initial encounter: Secondary | ICD-10-CM | POA: Diagnosis present

## 2023-06-04 DIAGNOSIS — S0083XA Contusion of other part of head, initial encounter: Secondary | ICD-10-CM | POA: Diagnosis not present

## 2023-06-04 DIAGNOSIS — N3001 Acute cystitis with hematuria: Secondary | ICD-10-CM | POA: Diagnosis not present

## 2023-06-04 DIAGNOSIS — S29011A Strain of muscle and tendon of front wall of thorax, initial encounter: Secondary | ICD-10-CM | POA: Diagnosis not present

## 2023-06-04 DIAGNOSIS — Y92000 Kitchen of unspecified non-institutional (private) residence as  the place of occurrence of the external cause: Secondary | ICD-10-CM | POA: Insufficient documentation

## 2023-06-04 DIAGNOSIS — W01198A Fall on same level from slipping, tripping and stumbling with subsequent striking against other object, initial encounter: Secondary | ICD-10-CM | POA: Insufficient documentation

## 2023-06-04 DIAGNOSIS — W19XXXA Unspecified fall, initial encounter: Secondary | ICD-10-CM

## 2023-06-04 LAB — CBC WITH DIFFERENTIAL/PLATELET
Abs Immature Granulocytes: 0.03 10*3/uL (ref 0.00–0.07)
Basophils Absolute: 0 10*3/uL (ref 0.0–0.1)
Basophils Relative: 0 %
Eosinophils Absolute: 0 10*3/uL (ref 0.0–0.5)
Eosinophils Relative: 0 %
HCT: 37.7 % (ref 36.0–46.0)
Hemoglobin: 13 g/dL (ref 12.0–15.0)
Immature Granulocytes: 0 %
Lymphocytes Relative: 7 %
Lymphs Abs: 0.6 10*3/uL — ABNORMAL LOW (ref 0.7–4.0)
MCH: 30 pg (ref 26.0–34.0)
MCHC: 34.5 g/dL (ref 30.0–36.0)
MCV: 87.1 fL (ref 80.0–100.0)
Monocytes Absolute: 0.5 10*3/uL (ref 0.1–1.0)
Monocytes Relative: 5 %
Neutro Abs: 7.7 10*3/uL (ref 1.7–7.7)
Neutrophils Relative %: 88 %
Platelets: 144 10*3/uL — ABNORMAL LOW (ref 150–400)
RBC: 4.33 MIL/uL (ref 3.87–5.11)
RDW: 13.8 % (ref 11.5–15.5)
WBC: 8.7 10*3/uL (ref 4.0–10.5)
nRBC: 0 % (ref 0.0–0.2)

## 2023-06-04 LAB — URINALYSIS, ROUTINE W REFLEX MICROSCOPIC
Bilirubin Urine: NEGATIVE
Glucose, UA: NEGATIVE mg/dL
Ketones, ur: NEGATIVE mg/dL
Nitrite: NEGATIVE
Protein, ur: NEGATIVE mg/dL
Specific Gravity, Urine: 1.016 (ref 1.005–1.030)
WBC, UA: >50 WBC/hpf (ref 0–5)
pH: 5 (ref 5.0–8.0)

## 2023-06-04 LAB — BASIC METABOLIC PANEL
Anion gap: 17 — ABNORMAL HIGH (ref 5–15)
BUN: 43 mg/dL — ABNORMAL HIGH (ref 8–23)
CO2: 18 mmol/L — ABNORMAL LOW (ref 22–32)
Calcium: 10 mg/dL (ref 8.9–10.3)
Chloride: 103 mmol/L (ref 98–111)
Creatinine, Ser: 1.16 mg/dL — ABNORMAL HIGH (ref 0.44–1.00)
GFR, Estimated: 48 mL/min — ABNORMAL LOW (ref >60)
Glucose, Bld: 225 mg/dL — ABNORMAL HIGH (ref 70–99)
Potassium: 4.1 mmol/L (ref 3.5–5.1)
Sodium: 138 mmol/L (ref 135–145)

## 2023-06-04 MED ORDER — ACETAMINOPHEN 500 MG PO TABS: 500.0000 mg | ORAL_TABLET | Freq: Four times a day (QID) | ORAL | 0 refills | Status: AC | PRN

## 2023-06-04 MED ORDER — OXYCODONE-ACETAMINOPHEN 5-325 MG PO TABS
1.0000 | ORAL_TABLET | Freq: Once | ORAL | Status: DC
  Filled 2023-06-04: qty 1

## 2023-06-04 MED ORDER — CEPHALEXIN 500 MG PO CAPS: 500.0000 mg | ORAL_CAPSULE | Freq: Three times a day (TID) | ORAL | 0 refills | Status: AC

## 2023-06-04 MED ORDER — ACETAMINOPHEN 325 MG PO TABS
650.0000 mg | ORAL_TABLET | Freq: Once | ORAL | Status: AC
  Administered 2023-06-04: 650 mg via ORAL
  Filled 2023-06-04: qty 2

## 2023-06-04 MED ORDER — KETOROLAC TROMETHAMINE 60 MG/2ML IM SOLN
30.0000 mg | Freq: Once | INTRAMUSCULAR | Status: AC
  Administered 2023-06-04: 30 mg via INTRAMUSCULAR
  Filled 2023-06-04: qty 2

## 2023-06-04 NOTE — Discharge Instructions (Addendum)
You have been diagnosed with facial contusion, muscle strain of chest wall, and urinary tract infection.  Please take the antibiotic 3 times per day every 8 hours after main meals.  Please take acetaminophen 500 mg every 6 hours as needed for pain.  Please take plenty of fluids.  Please call and make an appointment with your PCP for a follow-up in a week.  Please come back to ED or go to your PCP if you have new symptoms or worsening symptoms.

## 2023-06-04 NOTE — ED Provider Triage Note (Signed)
Emergency Medicine Provider Triage Evaluation Note  Rhonda Tyler , a 83 y.o. female  was evaluated in triage.  Pt complains of fall today. Patient did hit her head, also states right side is throbbing.  Review of Systems  Positive: Headache, knee pain Negative:   Physical Exam  BP 101/85   Pulse 75   Temp 98 F (36.7 C)   Resp 18   SpO2 93%  Gen:   Awake, no distress   Resp:  Normal effort  MSK:   Moves extremities without difficulty  Other:  Abrasion and bruising to right forehead  Medical Decision Making  Medically screening exam initiated at 2:20 PM.  Appropriate orders placed.  Rhonda Tyler was informed that the remainder of the evaluation will be completed by another provider, this initial triage assessment does not replace that evaluation, and the importance of remaining in the ED until their evaluation is complete.    Cameron Ali, PA-C 06/04/23 1424

## 2023-06-04 NOTE — ED Triage Notes (Signed)
Pt comes with c/o fall via EMS. Pt states pain all down right sided. Pt state she fell walking into her kitchen. Pt states she slipped and fell hitting her head. Pt has abrasion to right side of head. Pt not on thinners. Pt denies any loc. Pt state right knee pain .

## 2023-06-04 NOTE — ED Provider Notes (Signed)
[facility] Provider Note    Event Date/Time   First MD Initiated Contact with Patient 06/04/23 1600     (approximate)   History   Fall   HPI  Rhonda Tyler is a 83 y.o. female with history of fall when walking in her kitchen, patient slipped and fell hitting her head.  Patient denies any lacerations, loss of consciousness.  Patient states having right body pain, specially at the level of the ribs.      Physical Exam   Triage Vital Signs: ED Triage Vitals  Encounter Vitals Group     BP 06/04/23 1419 101/85     Systolic BP Percentile --      Diastolic BP Percentile --      Pulse Rate 06/04/23 1419 75     Resp 06/04/23 1419 18     Temp 06/04/23 1419 98 F (36.7 C)     Temp src --      SpO2 06/04/23 1419 93 %     Weight 06/04/23 1421 147 lb (66.7 kg)     Height 06/04/23 1421 5' 3.5" (1.613 m)     Head Circumference --      Peak Flow --      Pain Score 06/04/23 1421 9     Pain Loc --      Pain Education --      Exclude from Growth Chart --     Most recent vital signs: Vitals:   06/04/23 1419  BP: 101/85  Pulse: 75  Resp: 18  Temp: 98 F (36.7 C)  SpO2: 93%     Constitutional: Alert mild distress Eyes: Conjunctivae are normal.  Head: Atraumatic. Nose: No congestion/rhinnorhea. Mouth/Throat: Mucous membranes are moist.   Neck: Painless ROM.  Cardiovascular:   Good peripheral circulation. Respiratory: Normal respiratory effort.  No retractions.  Gastrointestinal: Soft and nontender.  Musculoskeletal:  no deformity Neurologic:  MAE spontaneously. No gross focal neurologic deficits are appreciated.  Skin:  Skin is warm, dry and intact. No rash noted. Psychiatric: Mood and affect are normal. Speech and behavior are normal.    ED Results / Procedures / Treatments   Labs (all labs ordered are listed, but only abnormal results are displayed) Labs Reviewed  BASIC METABOLIC PANEL - Abnormal; Notable for the following components:       Result Value   CO2 18 (*)    Glucose, Bld 225 (*)    BUN 43 (*)    Creatinine, Ser 1.16 (*)    GFR, Estimated 48 (*)    Anion gap 17 (*)    All other components within normal limits  CBC WITH DIFFERENTIAL/PLATELET - Abnormal; Notable for the following components:   Platelets 144 (*)    Lymphs Abs 0.6 (*)    All other components within normal limits  URINALYSIS, ROUTINE W REFLEX MICROSCOPIC - Abnormal; Notable for the following components:   Color, Urine YELLOW (*)    APPearance CLOUDY (*)    Hgb urine dipstick SMALL (*)    Leukocytes,Ua LARGE (*)    Bacteria, UA MANY (*)    All other components within normal limits     EKG     RADIOLOGY     PROCEDURES:  Critical Care performed:   Procedures   MEDICATIONS ORDERED IN ED: Medications  acetaminophen (TYLENOL) tablet 650 mg (650 mg Oral Given 06/04/23 1435)  ketorolac (TORADOL) injection 30 mg (30 mg Intramuscular Given 06/04/23 1631)     IMPRESSION /  MDM / ASSESSMENT AND PLAN / ED COURSE  I reviewed the triage vital signs and the nursing notes.  Differential diagnosis includes, but is not limited to, fall, rib fracture, epidural hematoma,  Patient's presentation is most consistent with acute complicated illness / injury requiring diagnostic workup.   Patient's diagnosis is consistent with fall, muscle strain in thorax wall, UTI I independently reviewed and interpreted imaging and agree with radiologists findings. Labs are rea reassuring. I did review the patient's allergies and medications. Patient will be discharged home with prescriptions for cephalexin. Patient is to follow up with PCP as needed or otherwise directed. Patient is given ED precautions to return to the ED for any worsening or new symptoms. Discussed plan of care with patient, answered all of patient's questions, Patient agreeable to plan of care. Advised patient to take medications according to the instructions on the label. Discussed possible  side effects of new medications. Patient verbalized understanding. Clinical Course as of 06/04/23 2131  Fri Jun 04, 2023  1742 DG Chest 2 View No active cardiopulmonary disease, no fractures [AE]  1742 DG Knee Complete 4 Views Left No fracture [AE]  1742 CT Head Wo Contrast No CT evidence of intracranial injury. [AE]  1742 CT Cervical Spine Wo Contrast No acute fracture or traumatic subluxation of the cervical spine [AE]  2014 Anion gap(!): 17 [AE]    Clinical Course User Index [AE] Gladys Damme, PA-C     FINAL CLINICAL IMPRESSION(S) / ED DIAGNOSES   Final diagnoses:  Fall, initial encounter  Muscle strain of chest wall, initial encounter  Contusion of face, initial encounter  Acute cystitis with hematuria     Rx / DC Orders   ED Discharge Orders          Ordered    acetaminophen (TYLENOL) 500 MG tablet  Every 6 hours PRN        06/04/23 1746    cephALEXin (KEFLEX) 500 MG capsule  3 times daily        06/04/23 2129             Note:  This document was prepared using Dragon voice recognition software and may include unintentional dictation errors.   Gladys Damme, PA-C 06/04/23 2131    Jene Every, MD 06/04/23 [phone]

## 2023-06-04 NOTE — ED Notes (Signed)
[name], Mr. Rhonda Tyler, and Mr. Rhonda Tyler (neighbor [phone] was called in regards to picking pt up from ED after being D/C but they was not able to secure transport home.

## 2023-06-05 MED ORDER — ACETAMINOPHEN 500 MG PO TABS
1000.0000 mg | ORAL_TABLET | Freq: Once | ORAL | Status: AC
  Administered 2023-06-05: 1000 mg via ORAL
  Filled 2023-06-05: qty 2

## 2023-06-05 NOTE — ED Notes (Signed)
Friend is here to pick up the patient.

## 2023-06-05 NOTE — ED Notes (Signed)
Rhonda Tyler, patients neighbor contacted in attempt to arrange for secure transport home.  States he is in a meeting and will be after 11 before he can provide transportation for patient.  Patient provided update.

## 2023-06-05 NOTE — ED Notes (Signed)
Patient was assisted to hallway bathroom via recliner. Patient was able to transfer from recliner and walk to the toilet with one-person assist.

## 2023-06-05 NOTE — ED Notes (Signed)
This RN inquiring about what would be a good time to call someone to pick her up this morning; patient states, "Oh, I dont know, I may just have to take a cab because all of my family is out of town.  My neighbor might be able to come, but I hate to bother them."  Will attempt to call neighbor between 0700-0800 today; if unsuccessful, will arrange for cab.

## 2023-06-05 NOTE — ED Notes (Signed)
Patient is resting in recliner in hallway. NAD. Patient is waiting for ride at 11:00.

## 2023-08-23 ENCOUNTER — Inpatient Hospital Stay: Payer: Medicare Other | Attending: Oncology

## 2023-08-26 ENCOUNTER — Inpatient Hospital Stay

## 2023-08-26 ENCOUNTER — Inpatient Hospital Stay: Payer: Medicare Other | Admitting: Oncology

## 2023-08-26 ENCOUNTER — Inpatient Hospital Stay: Payer: Medicare Other

## 2023-09-06 ENCOUNTER — Inpatient Hospital Stay

## 2023-09-08 ENCOUNTER — Other Ambulatory Visit: Payer: Self-pay

## 2023-09-09 ENCOUNTER — Inpatient Hospital Stay: Attending: Oncology | Admitting: Oncology

## 2023-09-09 ENCOUNTER — Other Ambulatory Visit

## 2023-09-09 ENCOUNTER — Inpatient Hospital Stay

## 2023-09-09 ENCOUNTER — Encounter: Payer: Self-pay | Admitting: Oncology

## 2023-11-02 NOTE — Progress Notes (Signed)
 Rhonda Tyler is a 83 y.o. female here for follow up of their medical problems  CHIEF COMPLAINT:  Follow up medical problems in the problem list and as discussed in the history and assessment areas as well as new complaints as listed.   Patient Active Problem List  Diagnosis  . Coronary atherosclerosis of autologous vein bypass graft  . Headache  . B12 deficiency   . Type 2 diabetes mellitus with stage 4 chronic kidney disease (CMS/HHS-HCC)  . Hyperlipidemia associated with type 2 diabetes mellitus , unspecified (CMS-HCC)  . Health care maintenance  . Cutaneous sarcoidosis (CMS-HCC)  . OSA on CPAP  . Liver cirrhosis secondary to NASH (nonalcoholic steatohepatitis) (CMS/HHS-HCC)  . Idiopathic gout, unspecified site  . S/P revision of total knee  . SVT (supraventricular tachycardia) (CMS/HHS-HCC)  . Chronic diastolic CHF (congestive heart failure) (CMS/HHS-HCC)     HISTORY OF PRESENT ILLNESS:  Type 2 diabetes mellitus with stage 4 chronic kidney disease (CMS-HCC) Following a diabetic diet and medications are tolerated as appropriate.  Uremic symptoms such as nausea and itching are not noted and nephrotoxic medications are being avoided.    OSA on CPAP Continues to use cpap consistently and is continuing to benefit from it's use.    Liver cirrhosis secondary to NASH (nonalcoholic steatohepatitis) (CMS/HHS-HCC) Gi follows with unclear etiology   Hyperlipidemia associated with type 2 diabetes mellitus , unspecified (CMS-HCC) Healthy fat diet is being followed and no myalgia's are noted.   Cutaneous sarcoidosis Off vitamin d    Coronary atherosclerosis of autologous vein bypass graft Exertional chest pain is not present and patient is tolerating the prescribed medical regimen.    Chronic diastolic CHF (congestive heart failure) (CMS/HHS-HCC) Edema is controlled at baseline and sob is not worsening.    Past Medical History:  Diagnosis Date  . Allergic state   . Anemia     iron  infusions beginning 07/08/2018  . Arthritis   . Asthma, unspecified asthma severity, unspecified whether complicated, unspecified whether persistent (HHS-HCC)   . Blockage of coronary artery of heart (CMS/HHS-HCC)   . Cervical disc disease   . Chest pain   . Cholecystolithiasis   . Chronic kidney disease   . Colon polyp   . Coronary artery disease   . Diabetes mellitus type 2, uncomplicated (CMS/HHS-HCC)   . Diabetic neuropathy (CMS/HHS-HCC)   . Foot fracture   . GERD (gastroesophageal reflux disease)   . Heart attack (CMS/HHS-HCC)   . Heart failure, unspecified (CMS/HHS-HCC) 10/16/2020  . Heart murmur   . Hemorrhoids   . History of cataract   . Hyperlipidemia   . Hypertension   . IBS (irritable bowel syndrome)   . Reactive airway disease (HHS-HCC)   . Sleep apnea     Past Surgical History:  Procedure Laterality Date  . CORONARY ARTERY BYPASS GRAFT  1993  . HYSTERECTOMY SUPRACERVICAL ABDOMINAL W/REMOVAL TUBES &/OR OVARIES  1995  . CHOLECYSTECTOMY  1997  . Foot fracture repair  1999  . VESICULAR VAGINAL FISTULA  2002  . BACK SURGERY  2002   Neck Surgery- Dr Amon Morita  . ENDOSCOPIC CARPAL TUNNEL RELEASE  2004  . COLONOSCOPY  03/15/2003  . EXCISION OF MASS  03/05/2004  . LEFT TOTAL KNEE REPLACEMENT  04/20/2006  . COLONOSCOPY  01/04/2008   To repeat 01/04/2008...AW, CMA**  . ARTHROSCOPIC ROTATOR CUFF REPAIR  07/18/2010  . COLONOSCOPY  06/01/2012   Within normal limits..Repeat 10 years/[place]..AW, CMA**  . EGD  06/01/2012   Multiple gastric polyps.SABRANo  Repeat/[place]..AW, CMA**  . COLONOSCOPY  11/29/2017   Normal colon/Repeat 58yrs/MUS  . EGD  11/29/2017   Hyperplastic gastric polyps/Repeat 6 to 8 weeks/MUS  . EGD  03/07/2018   Gastric polyps/Repeat 107yr/MUS  . EGD  08/16/2019   Multiple gastric polyps/No Repeat/TKT  . Left total knee revision arthroplasty   05/07/2021   Dr Mardee  . CARDIAC CATHETERIZATION    . CHOLECYSTECTOMY    . CORONARY ANGIOPLASTY    .  HYSTERECTOMY    . PTCA and stent of the RCA       No fever chills or sweats   No nausea, vomiting or diarrhea  No chest pain, shortness of breath   Social History   Socioeconomic History  . Marital status: Widowed  . Number of children: 2  . Years of education: 48  . Highest education level: High school graduate  Occupational History  . Occupation: Retired  Tobacco Use  . Smoking status: Former    Current packs/day: 0.00    Types: Cigarettes    Quit date: 12/01/1970    Years since quitting: 52.9  . Smokeless tobacco: Never  Vaping Use  . Vaping status: Never Used  Substance and Sexual Activity  . Alcohol use: No  . Drug use: No  . Sexual activity: Never   Social Drivers of Health   Food Insecurity: No Food Insecurity (09/13/2022)   Received from [facility]   Hunger Vital Sign   . Within the past 12 months, you worried that your food would run out before you got the money to buy more.: Never true   . Within the past 12 months, the food you bought just didn't last and you didn't have money to get more.: Never true  Transportation Needs: No Transportation Needs (09/13/2022)   Received from [facility] - Transportation   . Lack of Transportation (Medical): No   . Lack of Transportation (Non-Medical): No   Received from [facility]   Social Network  Housing Stability: Unknown (10/07/2023)   Housing Stability Vital Sign   . Homeless in the Last Year: No      Current Outpatient Medications:  .  acetaminophen  (TYLENOL ) 500 mg capsule, Take 1 capsule (500 mg total) by mouth every 4 (four) hours as needed for Fever, Disp: , Rfl:  .  albuterol  MDI, PROVENTIL , VENTOLIN , PROAIR , HFA 90 mcg/actuation inhaler, Inhale 2 inhalations into the lungs every 4 (four) hours as needed for Wheezing, Disp: 1 each, Rfl: 0 .  ascorbic acid , vitamin C , (VITAMIN C ) 500 MG tablet, Take 500 mg by mouth once daily, Disp: , Rfl:  .  blood glucose diagnostic (ONETOUCH ULTRA BLUE TEST STRIP)  test strip, 3 (three) times daily Dx e11.65, Disp: 300 each, Rfl: 3 .  cholecalciferol  (VITAMIN D3) 1000 unit tablet, Take 1,000 Units by mouth once daily, Disp: , Rfl:  .  CYANOCOBALAMIN , VITAMIN B-12, (VITAMIN B-12 ORAL), Take 2,500 mcg by mouth once daily, Disp: , Rfl:  .  dilTIAZem  (CARDIZEM  CD) 120 MG XR capsule, Take 1 capsule by mouth once daily, Disp: 90 capsule, Rfl: 3 .  fenofibrate  160 MG tablet, Take 1 tablet by mouth once daily, Disp: 60 tablet, Rfl: 11 .  flash glucose sensor (FREESTYLE LIBRE 2 SENSOR) Kit, Use 1 kit every 14 (fourteen) days, Disp: 9 each, Rfl: 3 .  fluticasone-umeclidinium-vilanterol (TRELEGY ELLIPTA) 100-62.5-25 mcg inhaler, Inhale 1 Puff into the lungs once daily, Disp: 1 each, Rfl: 5 .  FREESTYLE LIBRE 2  READER reader, Use to monitor blood glucose., Disp: 1 each, Rfl: 1 .  hydrOXYchloroQUINE  (PLAQUENIL ) 200 mg tablet, Take 1 tablet (200 mg total) by mouth once daily, Disp: , Rfl:  .  insulin  GLARGINE (LANTUS ) injection (concentration 100 units/mL), Inject 14 Units subcutaneously at bedtime, Disp: , Rfl:  .  insulin  NPH-REGULAR (NOVOLIN  70/30 U-100 INSULIN ) 100 unit/mL (70-30) injection, Inject 25 units subcutaneously in the morning and 20 units at night., Disp: 30 mL, Rfl: 3 .  lisinopriL  (ZESTRIL ) 5 MG tablet, Take 5 mg by mouth once daily, Disp: , Rfl:  .  loratadine  (CLARITIN ) 10 mg capsule, Take 1 capsule (10 mg total) by mouth once daily, Disp: , Rfl:  .  magnesium  oxide (MAG-OX) 400 mg (241.3 mg magnesium ) tablet, Take 1 tablet by mouth twice daily, Disp: 120 tablet, Rfl: 3 .  multivitamin tablet, Take 1 tablet by mouth once daily, Disp: , Rfl:  .  nitroGLYcerin  (NITROSTAT ) 0.4 MG SL tablet, DISSOLVE ONE TABLET UNDER THE TONGUE EVERY 5 MINUTES AS NEEDED FOR CHEST PAIN.  DO NOT EXCEED A TOTAL OF 3 DOSES IN 15 MINUTES, Disp: 25 tablet, Rfl: 0 .  pantoprazole  (PROTONIX ) 40 MG DR tablet, Take 1 tablet (40 mg total) by mouth 2 (two) times daily, Disp: 180 tablet,  Rfl: 3 .  propranoloL  (INDERAL  LA) 60 MG LA capsule, Take 1 capsule by mouth once daily, Disp: 90 capsule, Rfl: 0 .  SANTYL ointment, Apply topically once daily, Disp: , Rfl:  .  sennosides (SENOKOT) 8.6 mg tablet, Take 1 tablet by mouth once daily, Disp: , Rfl:  .  sertraline  (ZOLOFT ) 100 MG tablet, Take 1 tablet by mouth once daily, Disp: 90 tablet, Rfl: 0 .  spironolactone  (ALDACTONE ) 50 MG tablet, Take 1 tablet (50 mg total) by mouth once daily, Disp: 90 tablet, Rfl: 3 .  TORsemide  (DEMADEX ) 100 MG tablet, Take 1 tablet (100 mg total) by mouth once daily, Disp: 90 tablet, Rfl: 3 .  triamcinolone 0.1 % cream, Apply 1 Application topically 2 (two) times daily as needed, Disp: , Rfl:  .  ondansetron  (ZOFRAN ) 4 MG tablet, Take 1 tablet (4 mg total) by mouth every 8 (eight) hours as needed for Nausea for up to 30 days, Disp: 20 tablet, Rfl: 3  Vitals:   11/02/23 1106  BP: 131/78  Pulse: 71  Resp: 16   Body mass index is 25.86 kg/m. No acute distress Lungs; clear to ascultation Heart; Regular rate and rhythm  Abdomen; Soft and flat, normal bowel sounds Extremities; No clubbing, cyanosis or edema  Office Visit on 09/01/2023  Component Date Value Ref Range Status  . Glucose 09/01/2023 355 (H)  70 - 110 mg/dL Final  . Sodium 95/76/7974 134 (L)  136 - 145 mmol/L Final  . Potassium 09/01/2023 3.8  3.6 - 5.1 mmol/L Final  . Chloride 09/01/2023 94 (L)  97 - 109 mmol/L Final  . Carbon Dioxide (CO2) 09/01/2023 34.2 (H)  22.0 - 32.0 mmol/L Final  . Calcium  09/01/2023 8.8  8.7 - 10.3 mg/dL Final  . Urea Nitrogen (BUN) 09/01/2023 25  7 - 25 mg/dL Final  . Creatinine 95/76/7974 1.2 (H)  0.6 - 1.1 mg/dL Final  . Glomerular Filtration Rate (eGFR) 09/01/2023 46 (L)  >60 mL/min/1.73sq m Final  . BUN/Crea Ratio 09/01/2023 20.8 (H)  6.0 - 20.0 Final  . Anion Gap w/K 09/01/2023 9.6  6.0 - 16.0 Final    ASSESSMENT  AND PLAN:  Diagnoses and all orders for this  visit:  Chronic diastolic CHF  (congestive heart failure) (CMS/HHS-HCC) Assessment & Plan: Edema is controlled at baseline and sob is not worsening.    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     Hyperlipidemia associated with type 2 diabetes mellitus , unspecified (CMS-HCC) Assessment & Plan: Healthy fat diet is being followed and no myalgia's are noted.    Liver cirrhosis secondary to NASH (nonalcoholic steatohepatitis) (CMS/HHS-HCC) Assessment & Plan: Gi follows with unclear etiology    OSA on CPAP Assessment & Plan: Continues to use cpap consistently and is continuing to benefit from it's use.     Type 2 diabetes mellitus with stage 4 chronic kidney disease, with long-term current use of insulin  (CMS/HHS-HCC) Assessment & Plan: Following a diabetic diet and medications are tolerated as appropriate.  Uremic symptoms such as nausea and itching are not noted and nephrotoxic medications are being avoided.     Cutaneous sarcoidosis (CMS-HCC) Assessment & Plan: Off vitamin d     B12 deficiency   Other orders -     ondansetron  (ZOFRAN ) 4 MG tablet; Take 1 tablet (4 mg total) by mouth every 8 (eight) hours as needed for Nausea for up to 30 days    Feeling poorly today with a ha (has ha's and typical but did fall a couple days ago), nausea and sob (9 lbs down on torsemide  now). Check above and use prn zofran , has a plethora of potential etiologies so hold big wu. Use the zofran  and tylenol  after and if not improved let me know

## 2024-01-18 ENCOUNTER — Other Ambulatory Visit
Admission: RE | Admit: 2024-01-18 | Discharge: 2024-01-18 | Disposition: A | Discharge status: Home / Self Care | Source: Ambulatory Visit | Attending: Internal Medicine | Admitting: Internal Medicine

## 2024-01-18 ENCOUNTER — Other Ambulatory Visit: Payer: Self-pay

## 2024-01-18 ENCOUNTER — Inpatient Hospital Stay
Admission: EM | Admit: 2024-01-18 | Discharge: 2024-01-24 | DRG: 871 | Disposition: A | Discharge status: Home Health | Attending: Internal Medicine | Admitting: Internal Medicine

## 2024-01-18 ENCOUNTER — Emergency Department

## 2024-01-18 DIAGNOSIS — K219 Gastro-esophageal reflux disease without esophagitis: Secondary | ICD-10-CM | POA: Diagnosis present

## 2024-01-18 DIAGNOSIS — E871 Hypo-osmolality and hyponatremia: Secondary | ICD-10-CM | POA: Diagnosis present

## 2024-01-18 DIAGNOSIS — I482 Chronic atrial fibrillation, unspecified: Secondary | ICD-10-CM | POA: Diagnosis present

## 2024-01-18 DIAGNOSIS — Z885 Allergy status to narcotic agent status: Secondary | ICD-10-CM

## 2024-01-18 DIAGNOSIS — D519 Vitamin B12 deficiency anemia, unspecified: Secondary | ICD-10-CM | POA: Diagnosis present

## 2024-01-18 DIAGNOSIS — D509 Iron deficiency anemia, unspecified: Secondary | ICD-10-CM | POA: Diagnosis present

## 2024-01-18 DIAGNOSIS — A09 Infectious gastroenteritis and colitis, unspecified: Secondary | ICD-10-CM | POA: Diagnosis present

## 2024-01-18 DIAGNOSIS — E785 Hyperlipidemia, unspecified: Secondary | ICD-10-CM | POA: Diagnosis present

## 2024-01-18 DIAGNOSIS — I5032 Chronic diastolic (congestive) heart failure: Secondary | ICD-10-CM | POA: Insufficient documentation

## 2024-01-18 DIAGNOSIS — Z881 Allergy status to other antibiotic agents status: Secondary | ICD-10-CM

## 2024-01-18 DIAGNOSIS — D649 Anemia, unspecified: Secondary | ICD-10-CM

## 2024-01-18 DIAGNOSIS — K7581 Nonalcoholic steatohepatitis (NASH): Secondary | ICD-10-CM | POA: Insufficient documentation

## 2024-01-18 DIAGNOSIS — Z951 Presence of aortocoronary bypass graft: Secondary | ICD-10-CM

## 2024-01-18 DIAGNOSIS — N189 Chronic kidney disease, unspecified: Secondary | ICD-10-CM | POA: Diagnosis present

## 2024-01-18 DIAGNOSIS — Z87891 Personal history of nicotine dependence: Secondary | ICD-10-CM

## 2024-01-18 DIAGNOSIS — G629 Polyneuropathy, unspecified: Secondary | ICD-10-CM

## 2024-01-18 DIAGNOSIS — I2510 Atherosclerotic heart disease of native coronary artery without angina pectoris: Secondary | ICD-10-CM | POA: Diagnosis present

## 2024-01-18 DIAGNOSIS — E1122 Type 2 diabetes mellitus with diabetic chronic kidney disease: Secondary | ICD-10-CM | POA: Diagnosis present

## 2024-01-18 DIAGNOSIS — Z955 Presence of coronary angioplasty implant and graft: Secondary | ICD-10-CM

## 2024-01-18 DIAGNOSIS — Z1152 Encounter for screening for COVID-19: Secondary | ICD-10-CM

## 2024-01-18 DIAGNOSIS — L89153 Pressure ulcer of sacral region, stage 3: Secondary | ICD-10-CM | POA: Diagnosis present

## 2024-01-18 DIAGNOSIS — Z90710 Acquired absence of both cervix and uterus: Secondary | ICD-10-CM

## 2024-01-18 DIAGNOSIS — Z79899 Other long term (current) drug therapy: Secondary | ICD-10-CM

## 2024-01-18 DIAGNOSIS — I10 Essential (primary) hypertension: Secondary | ICD-10-CM | POA: Diagnosis not present

## 2024-01-18 DIAGNOSIS — Z96652 Presence of left artificial knee joint: Secondary | ICD-10-CM | POA: Diagnosis present

## 2024-01-18 DIAGNOSIS — Z882 Allergy status to sulfonamides status: Secondary | ICD-10-CM

## 2024-01-18 DIAGNOSIS — K529 Noninfective gastroenteritis and colitis, unspecified: Secondary | ICD-10-CM | POA: Diagnosis present

## 2024-01-18 DIAGNOSIS — K7460 Unspecified cirrhosis of liver: Secondary | ICD-10-CM | POA: Diagnosis present

## 2024-01-18 DIAGNOSIS — E1142 Type 2 diabetes mellitus with diabetic polyneuropathy: Secondary | ICD-10-CM | POA: Diagnosis present

## 2024-01-18 DIAGNOSIS — E1165 Type 2 diabetes mellitus with hyperglycemia: Secondary | ICD-10-CM | POA: Diagnosis present

## 2024-01-18 DIAGNOSIS — E878 Other disorders of electrolyte and fluid balance, not elsewhere classified: Secondary | ICD-10-CM | POA: Diagnosis present

## 2024-01-18 DIAGNOSIS — Z794 Long term (current) use of insulin: Secondary | ICD-10-CM | POA: Diagnosis not present

## 2024-01-18 DIAGNOSIS — I129 Hypertensive chronic kidney disease with stage 1 through stage 4 chronic kidney disease, or unspecified chronic kidney disease: Secondary | ICD-10-CM | POA: Diagnosis present

## 2024-01-18 DIAGNOSIS — Z8249 Family history of ischemic heart disease and other diseases of the circulatory system: Secondary | ICD-10-CM | POA: Diagnosis not present

## 2024-01-18 DIAGNOSIS — Z880 Allergy status to penicillin: Secondary | ICD-10-CM

## 2024-01-18 DIAGNOSIS — A419 Sepsis, unspecified organism: Secondary | ICD-10-CM | POA: Diagnosis present

## 2024-01-18 DIAGNOSIS — I252 Old myocardial infarction: Secondary | ICD-10-CM

## 2024-01-18 DIAGNOSIS — Z888 Allergy status to other drugs, medicaments and biological substances status: Secondary | ICD-10-CM

## 2024-01-18 DIAGNOSIS — F32A Depression, unspecified: Secondary | ICD-10-CM | POA: Diagnosis present

## 2024-01-18 DIAGNOSIS — Z860100 Personal history of colon polyps, unspecified: Secondary | ICD-10-CM

## 2024-01-18 DIAGNOSIS — R112 Nausea with vomiting, unspecified: Secondary | ICD-10-CM

## 2024-01-18 DIAGNOSIS — E119 Type 2 diabetes mellitus without complications: Secondary | ICD-10-CM

## 2024-01-18 DIAGNOSIS — Z808 Family history of malignant neoplasm of other organs or systems: Secondary | ICD-10-CM

## 2024-01-18 LAB — URINALYSIS, ROUTINE W REFLEX MICROSCOPIC
Bacteria, UA: NONE SEEN
Bilirubin Urine: NEGATIVE
Glucose, UA: >=500 mg/dL — AB
Hgb urine dipstick: NEGATIVE
Ketones, ur: NEGATIVE mg/dL
Leukocytes,Ua: NEGATIVE
Nitrite: NEGATIVE
Protein, ur: NEGATIVE mg/dL
Specific Gravity, Urine: 1.021 (ref 1.005–1.030)
Squamous Epithelial / HPF: 0 /HPF (ref 0–5)
pH: 5 (ref 5.0–8.0)

## 2024-01-18 LAB — RESP PANEL BY RT-PCR (RSV, FLU A&B, COVID)  RVPGX2
Influenza A by PCR: NEGATIVE
Influenza B by PCR: NEGATIVE
Resp Syncytial Virus by PCR: NEGATIVE
SARS Coronavirus 2 by RT PCR: NEGATIVE

## 2024-01-18 LAB — CBC
HCT: 25.3 % — ABNORMAL LOW (ref 36.0–46.0)
Hemoglobin: 7.2 g/dL — ABNORMAL LOW (ref 12.0–15.0)
MCH: 19.7 pg — ABNORMAL LOW (ref 26.0–34.0)
MCHC: 28.5 g/dL — ABNORMAL LOW (ref 30.0–36.0)
MCV: 69.1 fL — ABNORMAL LOW (ref 80.0–100.0)
Platelets: 209 K/uL (ref 150–400)
RBC: 3.66 MIL/uL — ABNORMAL LOW (ref 3.87–5.11)
RDW: 15.5 % (ref 11.5–15.5)
WBC: 5.1 K/uL (ref 4.0–10.5)
nRBC: 0.6 % — ABNORMAL HIGH (ref 0.0–0.2)

## 2024-01-18 LAB — COMPREHENSIVE METABOLIC PANEL WITH GFR
ALT: 12 U/L (ref 0–44)
AST: 25 U/L (ref 15–41)
Albumin: 3.3 g/dL — ABNORMAL LOW (ref 3.5–5.0)
Alkaline Phosphatase: 120 U/L (ref 38–126)
Anion gap: 12 (ref 5–15)
BUN: 13 mg/dL (ref 8–23)
CO2: 23 mmol/L (ref 22–32)
Calcium: 9.4 mg/dL (ref 8.9–10.3)
Chloride: 95 mmol/L — ABNORMAL LOW (ref 98–111)
Creatinine, Ser: 1.12 mg/dL — ABNORMAL HIGH (ref 0.44–1.00)
GFR, Estimated: 49 mL/min — ABNORMAL LOW (ref >60)
Glucose, Bld: 419 mg/dL — ABNORMAL HIGH (ref 70–99)
Potassium: 4.1 mmol/L (ref 3.5–5.1)
Sodium: 130 mmol/L — ABNORMAL LOW (ref 135–145)
Total Bilirubin: 1.4 mg/dL — ABNORMAL HIGH (ref 0.0–1.2)
Total Protein: 6.9 g/dL (ref 6.5–8.1)

## 2024-01-18 LAB — AMMONIA: Ammonia: 18 umol/L (ref 9–35)

## 2024-01-18 LAB — IRON AND TIBC
Iron: 14 ug/dL — ABNORMAL LOW (ref 28–170)
Saturation Ratios: 3 % — ABNORMAL LOW (ref 10.4–31.8)
TIBC: 461 ug/dL — ABNORMAL HIGH (ref 250–450)
UIBC: 447 ug/dL

## 2024-01-18 LAB — PREPARE RBC (CROSSMATCH)

## 2024-01-18 LAB — FERRITIN: Ferritin: 10 ng/mL — ABNORMAL LOW (ref 11–307)

## 2024-01-18 LAB — LIPASE, BLOOD: Lipase: 28 U/L (ref 11–51)

## 2024-01-18 MED ORDER — ONDANSETRON HCL 4 MG/2ML IJ SOLN
4.0000 mg | Freq: Four times a day (QID) | INTRAMUSCULAR | Status: DC | PRN
  Administered 2024-01-19 – 2024-01-24 (×4): 4 mg via INTRAVENOUS
  Filled 2024-01-18 (×5): qty 2

## 2024-01-18 MED ORDER — SODIUM CHLORIDE 0.9 % IV BOLUS
1000.0000 mL | Freq: Once | INTRAVENOUS | Status: AC
  Administered 2024-01-18: 1000 mL via INTRAVENOUS

## 2024-01-18 MED ORDER — CEFTRIAXONE SODIUM 2 G IJ SOLR
2.0000 g | Freq: Once | INTRAMUSCULAR | Status: AC
  Administered 2024-01-18: 2 g via INTRAVENOUS
  Filled 2024-01-18: qty 20

## 2024-01-18 MED ORDER — LACTATED RINGERS IV SOLN
150.0000 mL/h | INTRAVENOUS | Status: AC
  Administered 2024-01-19 (×3): 150 mL/h via INTRAVENOUS

## 2024-01-18 MED ORDER — METRONIDAZOLE 500 MG/100ML IV SOLN
500.0000 mg | Freq: Two times a day (BID) | INTRAVENOUS | Status: DC
  Administered 2024-01-19 – 2024-01-23 (×11): 500 mg via INTRAVENOUS
  Filled 2024-01-18 (×12): qty 100

## 2024-01-18 MED ORDER — VITAMIN C 500 MG PO TABS
500.0000 mg | ORAL_TABLET | Freq: Every day | ORAL | Status: DC
Start: 2024-01-19 — End: 2024-01-24
  Administered 2024-01-19 – 2024-01-24 (×6): 500 mg via ORAL
  Filled 2024-01-18 (×6): qty 1

## 2024-01-18 MED ORDER — SODIUM CHLORIDE 0.9 % IV SOLN
2.0000 g | INTRAVENOUS | Status: DC
  Administered 2024-01-19 – 2024-01-23 (×5): 2 g via INTRAVENOUS
  Filled 2024-01-18 (×5): qty 20

## 2024-01-18 MED ORDER — ONDANSETRON HCL 4 MG PO TABS: 4.0000 mg | ORAL_TABLET | Freq: Four times a day (QID) | ORAL | Status: DC | PRN

## 2024-01-18 MED ORDER — SENNA 8.6 MG PO TABS
8.6000 mg | ORAL_TABLET | Freq: Every day | ORAL | Status: DC
  Administered 2024-01-19 – 2024-01-21 (×3): 8.6 mg via ORAL
  Filled 2024-01-18 (×5): qty 1

## 2024-01-18 MED ORDER — MORPHINE SULFATE (PF) 2 MG/ML IV SOLN
2.0000 mg | INTRAVENOUS | Status: DC | PRN
  Administered 2024-01-19: 2 mg via INTRAVENOUS
  Filled 2024-01-18: qty 1

## 2024-01-18 MED ORDER — HYDROXYZINE HCL 25 MG PO TABS
25.0000 mg | ORAL_TABLET | Freq: Every day | ORAL | Status: DC
Start: 2024-01-19 — End: 2024-01-24
  Administered 2024-01-19 – 2024-01-24 (×6): 25 mg via ORAL
  Filled 2024-01-18 (×6): qty 1

## 2024-01-18 MED ORDER — ADULT MULTIVITAMIN W/MINERALS CH
1.0000 | ORAL_TABLET | Freq: Every day | ORAL | Status: DC
  Administered 2024-01-19 – 2024-01-23 (×6): 1 via ORAL
  Filled 2024-01-18 (×6): qty 1

## 2024-01-18 MED ORDER — SODIUM CHLORIDE 0.9% IV SOLUTION
Freq: Once | INTRAVENOUS | Status: AC
  Filled 2024-01-18: qty 250

## 2024-01-18 MED ORDER — VITAMIN B-12 1000 MCG PO TABS
2500.0000 ug | ORAL_TABLET | Freq: Every day | ORAL | Status: DC
  Administered 2024-01-19 – 2024-01-24 (×6): 2500 ug via ORAL
  Filled 2024-01-18: qty 3
  Filled 2024-01-18: qty 2.5
  Filled 2024-01-18 (×4): qty 3

## 2024-01-18 MED ORDER — LORATADINE 10 MG PO TABS
10.0000 mg | ORAL_TABLET | Freq: Every day | ORAL | Status: DC
Start: 2024-01-19 — End: 2024-01-24
  Administered 2024-01-19 – 2024-01-24 (×6): 10 mg via ORAL
  Filled 2024-01-18 (×6): qty 1

## 2024-01-18 MED ORDER — KETOROLAC TROMETHAMINE 15 MG/ML IJ SOLN
7.5000 mg | Freq: Once | INTRAMUSCULAR | Status: AC
  Administered 2024-01-18: 7.5 mg via INTRAVENOUS
  Filled 2024-01-18: qty 1

## 2024-01-18 MED ORDER — TRAZODONE HCL 50 MG PO TABS: 25.0000 mg | ORAL_TABLET | Freq: Every evening | ORAL | Status: DC | PRN

## 2024-01-18 MED ORDER — ALUM & MAG HYDROXIDE-SIMETH 200-200-20 MG/5ML PO SUSP: 30.0000 mL | ORAL | Status: DC | PRN

## 2024-01-18 MED ORDER — IOHEXOL 300 MG/ML  SOLN
100.0000 mL | Freq: Once | INTRAMUSCULAR | Status: AC | PRN
  Administered 2024-01-18: 100 mL via INTRAVENOUS

## 2024-01-18 MED ORDER — LISINOPRIL 10 MG PO TABS
5.0000 mg | ORAL_TABLET | Freq: Every day | ORAL | Status: DC
  Administered 2024-01-19 – 2024-01-24 (×5): 5 mg via ORAL
  Filled 2024-01-18 (×6): qty 1

## 2024-01-18 MED ORDER — FENOFIBRATE 160 MG PO TABS
160.0000 mg | ORAL_TABLET | Freq: Every day | ORAL | Status: DC
Start: 2024-01-19 — End: 2024-01-24
  Administered 2024-01-19 – 2024-01-24 (×6): 160 mg via ORAL
  Filled 2024-01-18 (×6): qty 1

## 2024-01-18 MED ORDER — ACETAMINOPHEN 325 MG PO TABS
650.0000 mg | ORAL_TABLET | Freq: Four times a day (QID) | ORAL | Status: DC | PRN
  Administered 2024-01-19 – 2024-01-23 (×5): 650 mg via ORAL
  Filled 2024-01-18 (×5): qty 2

## 2024-01-18 MED ORDER — FERROUS SULFATE 325 (65 FE) MG PO TABS
325.0000 mg | ORAL_TABLET | Freq: Every day | ORAL | Status: DC
  Administered 2024-01-19 – 2024-01-24 (×6): 325 mg via ORAL
  Filled 2024-01-18 (×6): qty 1

## 2024-01-18 MED ORDER — GABAPENTIN 100 MG PO CAPS
100.0000 mg | ORAL_CAPSULE | Freq: Three times a day (TID) | ORAL | Status: DC
  Administered 2024-01-19 – 2024-01-24 (×16): 100 mg via ORAL
  Filled 2024-01-18 (×17): qty 1

## 2024-01-18 MED ORDER — DILTIAZEM HCL ER COATED BEADS 120 MG PO CP24
120.0000 mg | ORAL_CAPSULE | Freq: Every day | ORAL | Status: DC
Start: 2024-01-19 — End: 2024-01-24
  Administered 2024-01-19 – 2024-01-24 (×6): 120 mg via ORAL
  Filled 2024-01-18 (×7): qty 1

## 2024-01-18 MED ORDER — MAGNESIUM OXIDE -MG SUPPLEMENT 400 (240 MG) MG PO TABS
400.0000 mg | ORAL_TABLET | Freq: Two times a day (BID) | ORAL | Status: DC
  Administered 2024-01-19 – 2024-01-24 (×12): 400 mg via ORAL
  Filled 2024-01-18 (×12): qty 1

## 2024-01-18 MED ORDER — PANTOPRAZOLE SODIUM 40 MG PO TBEC
40.0000 mg | DELAYED_RELEASE_TABLET | Freq: Two times a day (BID) | ORAL | Status: DC
  Administered 2024-01-19 – 2024-01-24 (×12): 40 mg via ORAL
  Filled 2024-01-18 (×12): qty 1

## 2024-01-18 MED ORDER — POLYETHYLENE GLYCOL 3350 17 G PO PACK
17.0000 g | PACK | Freq: Two times a day (BID) | ORAL | Status: DC
  Administered 2024-01-19 – 2024-01-21 (×6): 17 g via ORAL
  Filled 2024-01-18 (×8): qty 1

## 2024-01-18 MED ORDER — SPIRONOLACTONE 25 MG PO TABS
50.0000 mg | ORAL_TABLET | Freq: Every day | ORAL | Status: DC
Start: 2024-01-19 — End: 2024-01-19

## 2024-01-18 MED ORDER — PROMETHAZINE HCL 6.25 MG/5ML PO SOLN
6.2500 mg | Freq: Four times a day (QID) | ORAL | Status: DC | PRN
  Administered 2024-01-19: 6.25 mg via ORAL
  Filled 2024-01-18 (×3): qty 5

## 2024-01-18 MED ORDER — VITAMIN D 25 MCG (1000 UNIT) PO TABS
1000.0000 [IU] | ORAL_TABLET | Freq: Every day | ORAL | Status: DC
  Administered 2024-01-19 – 2024-01-24 (×6): 1000 [IU] via ORAL
  Filled 2024-01-18 (×6): qty 1

## 2024-01-18 MED ORDER — ACETAMINOPHEN 650 MG RE SUPP: 650.0000 mg | Freq: Four times a day (QID) | RECTAL | Status: DC | PRN

## 2024-01-18 MED ORDER — PROPRANOLOL HCL ER 60 MG PO CP24
60.0000 mg | ORAL_CAPSULE | Freq: Every day | ORAL | Status: DC
Start: 2024-01-19 — End: 2024-01-24
  Administered 2024-01-19 – 2024-01-24 (×5): 60 mg via ORAL
  Filled 2024-01-18 (×6): qty 1

## 2024-01-18 MED ORDER — ONDANSETRON HCL 4 MG/2ML IJ SOLN
4.0000 mg | Freq: Once | INTRAMUSCULAR | Status: AC
  Administered 2024-01-18: 4 mg via INTRAVENOUS
  Filled 2024-01-18: qty 2

## 2024-01-18 MED ORDER — SERTRALINE HCL 50 MG PO TABS
100.0000 mg | ORAL_TABLET | Freq: Every day | ORAL | Status: DC
Start: 2024-01-19 — End: 2024-01-24
  Administered 2024-01-19 – 2024-01-24 (×6): 100 mg via ORAL
  Filled 2024-01-18 (×6): qty 2

## 2024-01-18 MED ORDER — METRONIDAZOLE 500 MG/100ML IV SOLN: 500.0000 mg | Freq: Once | INTRAVENOUS | Status: DC

## 2024-01-18 NOTE — ED Provider Notes (Signed)
 [facility] Provider Note    Event Date/Time   First MD Initiated Contact with Patient 01/18/24 1816     (approximate)   History   Chief Complaint: Abnormal Labs   HPI  Rhonda Tyler is a 83 y.o. female with a past history of diabetes, hypertension, chronic atrial fibrillation, CKD who comes ED complaining of generalized weakness for the past 10 days.  Symptoms have worsened in the last 3 days with severe fatigue, vomiting, abdominal pain, and dizziness with standing.  No falls or trauma.  Denies fever.        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   Colon polyp    Coronary artery disease    Cutaneous sarcoidosis    Diabetes mellitus without complication (HCC)    Pt takes insulin    Diabetic neuropathy (HCC)    GERD (gastroesophageal reflux disease)    Headache    migaines in the past   Hemorrhoid    History of diabetic neuropathy    Hypercalcemia    Hyperlipemia    Hypertension    IBS (irritable bowel syndrome)    Iron  deficiency anemia    Myocardial infarction (HCC)    Pneumonia    Sleep apnea    NO CPAP    Current Outpatient Rx   Order #: [id number] Class: Normal   Order #: [id number] Class: Normal   Order #: [id number] Class: Normal   Order #: [id number] Class: Historical Med   Order #: [id number] Class: Historical Med   Order #: [id number] Class: Normal   Order #: [id number] Class: Historical Med   Order #: [id number] Class: Normal   Order #: [id number] Class: Historical Med   Order #: [id number] Class: Historical Med   Order #: [id number] Class: Historical Med   Order #: [id number] Class: No Print   Order #: [id number] Class: Historical Med   Order #: [id number] Class: Historical Med   Order #: [id number] Class: Historical Med   Order #:  [id number] Class: Historical Med   Order #: [id number] Class: Historical Med   Order #: [id number] Class: Historical Med   Order #: [id number] Class: Normal   Order #: [id number] Class: Print   Order #: [id number] Class: Normal   Order #: [id number] Class: Historical Med   Order #: [id number] Class: Historical Med   Order #: [id number] Class: No Print    Past Surgical History:  Procedure Laterality Date   ABDOMINAL HYSTERECTOMY     arthroscopic rotator cuff repair     BACK SURGERY     CARDIAC CATHETERIZATION     CATARACT EXTRACTION W/PHACO Right 01/26/2022   Procedure: CATARACT EXTRACTION PHACO AND INTRAOCULAR LENS PLACEMENT (IOC) RIGHT DIABETIC;  Surgeon: Myrna Adine Anes, MD;  Location: [facility];  Service: Ophthalmology;  Laterality: Right;  2.90 0:29.3   CATARACT EXTRACTION W/PHACO Left 02/09/2022   Procedure: CATARACT EXTRACTION PHACO AND INTRAOCULAR LENS PLACEMENT (IOC) LEFT DIABETIC 3.54 00:32.3;  Surgeon: Myrna Adine Anes, MD;  Location: [facility];  Service: Ophthalmology;  Laterality: Left;  Diabetic   CHOLECYSTECTOMY     COLONOSCOPY     COLONOSCOPY WITH PROPOFOL  N/A 11/29/2017   Procedure: COLONOSCOPY WITH PROPOFOL ;  Surgeon: Gaylyn Gladis PENNER, MD;  Location: [facility] END[facility]COPY;  Service: Endoscopy;  Laterality: N/A;   CORONARY ANGIOPLASTY     PTCA  and stent of RCA   CORONARY ARTERY BYPASS GRAFT     ESOPHAGOGASTRODUODENOSCOPY     ESOPHAGOGASTRODUODENOSCOPY (EGD) WITH PROPOFOL  N/A 11/29/2017   Procedure: ESOPHAGOGASTRODUODENOSCOPY (EGD) WITH PROPOFOL ;  Surgeon: Gaylyn Gladis PENNER, MD;  Location: [facility];  Service: Endoscopy;  Laterality: N/A;   ESOPHAGOGASTRODUODENOSCOPY (EGD) WITH PROPOFOL  N/A 03/07/2018   Procedure: ESOPHAGOGASTRODUODENOSCOPY (EGD) WITH PROPOFOL ;  Surgeon: Gaylyn Gladis PENNER, MD;  Location: [facility];  Service: Endoscopy;  Laterality: N/A;   ESOPHAGOGASTRODUODENOSCOPY (EGD) WITH PROPOFOL  N/A 08/16/2019   Procedure: ESOPHAGOGASTRODUODENOSCOPY  (EGD) WITH PROPOFOL ;  Surgeon: Toledo, Ladell POUR, MD;  Location: [facility] ENDOSCOPY;  Service: Gastroenterology;  Laterality: N/A;   EXCISION OF ADNEXAL MASS     JOINT REPLACEMENT  04/2021   left total knee replacement   OOPHORECTOMY     TOTAL KNEE REVISION Left 05/07/2021   Procedure: TOTAL KNEE REVISION;  Surgeon: Mardee Lynwood SQUIBB, MD;  Location: [facility] ORS;  Service: Orthopedics;  Laterality: Left;   vesicular vaginal fistula      Physical Exam   Triage Vital Signs: ED Triage Vitals [01/18/24 1754]  Encounter Vitals Group     BP 128/73     Girls Systolic BP Percentile      Girls Diastolic BP Percentile      Boys Systolic BP Percentile      Boys Diastolic BP Percentile      Pulse Rate (!) 158     Resp 20     Temp 98.4 F (36.9 C)     Temp Source Oral     SpO2 100 %     Weight 140 lb (63.5 kg)     Height 5' 3 (1.6 m)     Head Circumference      Peak Flow      Pain Score 0     Pain Loc      Pain Education      Exclude from Growth Chart     Most recent vital signs: Vitals:   01/18/24 2130 01/18/24 2225  BP: 138/61   Pulse: (!) 114   Resp: 20   Temp:  97.8 F (36.6 C)  SpO2: 100%     General: Awake, no distress.  CV:  Good peripheral perfusion.  Irregular rhythm, heart rate in the 120s Resp:  Normal effort.  Clear lungs Abd:  No distention.  Soft with right-sided abdominal tenderness, most prominent in right upper quadrant.  Stool Hemoccult negative Other:  Symmetric calf circumference no calf tenderness.  Dry oral mucosa.   ED Results / Procedures / Treatments   Labs (all labs ordered are listed, but only abnormal results are displayed) Labs Reviewed  COMPREHENSIVE METABOLIC PANEL WITH GFR - Abnormal; Notable for the following components:      Result Value   Sodium 130 (*)    Chloride 95 (*)    Glucose, Bld 419 (*)    Creatinine, Ser 1.12 (*)    Albumin 3.3 (*)    Total Bilirubin 1.4 (*)    GFR, Estimated 49 (*)    All other components within normal limits   CBC - Abnormal; Notable for the following components:   RBC 3.66 (*)    Hemoglobin 7.2 (*)    HCT 25.3 (*)    MCV 69.1 (*)    MCH 19.7 (*)    MCHC 28.5 (*)    nRBC 0.6 (*)    All other components within normal limits  URINALYSIS, ROUTINE W REFLEX MICROSCOPIC - Abnormal; Notable for the following  components:   Color, Urine YELLOW (*)    APPearance CLEAR (*)    Glucose, UA >=500 (*)    All other components within normal limits  IRON  AND TIBC - Abnormal; Notable for the following components:   Iron  14 (*)    TIBC 461 (*)    Saturation Ratios 3 (*)    All other components within normal limits  FERRITIN - Abnormal; Notable for the following components:   Ferritin 10 (*)    All other components within normal limits  RESP PANEL BY RT-PCR (RSV, FLU A&B, COVID)  RVPGX2  LIPASE, BLOOD  CBG MONITORING, ED  TYPE AND SCREEN  PREPARE RBC (CROSSMATCH)     EKG Interpreted by me Atrial fibrillation rate of 130.  Left axis, no acute changes.   RADIOLOGY CT abdomen pelvis interpreted by me, no obstruction or free air.  Radiology report reviewed noting ascending colitis   PROCEDURES:  Procedures   MEDICATIONS ORDERED IN ED: Medications  0.9 %  sodium chloride  infusion (Manually program via Guardrails IV Fluids) (has no administration in time range)  cefTRIAXone  (ROCEPHIN ) 2 g in sodium chloride  0.9 % 100 mL IVPB (has no administration in time range)  metroNIDAZOLE  (FLAGYL ) IVPB 500 mg (has no administration in time range)  ondansetron  (ZOFRAN ) injection 4 mg (4 mg Intravenous Given 01/18/24 1900)  sodium chloride  0.9 % bolus 1,000 mL (0 mLs Intravenous Stopped 01/18/24 2037)  ketorolac  (TORADOL ) 15 MG/ML injection 7.5 mg (7.5 mg Intravenous Given 01/18/24 1903)  iohexol  (OMNIPAQUE ) 300 MG/ML solution 100 mL (100 mLs Intravenous Contrast Given 01/18/24 2038)     IMPRESSION / MDM / ASSESSMENT AND PLAN / ED COURSE  I reviewed the triage vital signs and the nursing notes.  DDx: Biliary  disease, pancreatitis, bowel obstruction, intra-abdominal abscess, colitis, dehydration, anemia  Patient's presentation is most consistent with acute presentation with potential threat to life or bodily function.  Patient presents with generalized weakness, vomiting.  Has abdominal tenderness on exam.  CT shows colitis.  Labs reveal anemia with a hemoglobin of 7 today compared with previous baseline around 11.  Unfortunately previous labs are 8 months and more ago.  Iron  studies show iron  depletion.  Will transfuse 1 unit red cells.  Case discussed with hospitalist for further management.       FINAL CLINICAL IMPRESSION(S) / ED DIAGNOSES   Final diagnoses:  Colitis  Symptomatic anemia  Nausea and vomiting, unspecified vomiting type  Chronic atrial fibrillation (HCC)  Type 2 diabetes mellitus without complication, with long-term current use of insulin  (HCC)     Rx / DC Orders   ED Discharge Orders     None        Note:  This document was prepared using Dragon voice recognition software and may include unintentional dictation errors.   Viviann Pastor, MD 01/18/24 [phone]

## 2024-01-18 NOTE — Progress Notes (Signed)
 Rhonda Tyler is a 83 y.o. female here for follow up of their medical problems  CHIEF COMPLAINT:  Follow up medical problems in the problem list and as discussed in the history and assessment areas as well as new complaints as listed.   Patient Active Problem List  Diagnosis  . Coronary atherosclerosis of autologous vein bypass graft  . Headache  . B12 deficiency   . Type 2 diabetes mellitus with stage 4 chronic kidney disease (CMS/HHS-HCC)  . Hyperlipidemia associated with type 2 diabetes mellitus , unspecified (CMS-HCC)  . Health care maintenance  . Cutaneous sarcoidosis (CMS-HCC)  . OSA on CPAP  . Liver cirrhosis secondary to NASH (nonalcoholic steatohepatitis) (CMS/HHS-HCC)  . Idiopathic gout, unspecified site  . S/P revision of total knee  . SVT (supraventricular tachycardia) (CMS/HHS-HCC)  . Chronic diastolic CHF (congestive heart failure) (CMS/HHS-HCC)     HISTORY OF PRESENT ILLNESS:  Type 2 diabetes mellitus with stage 4 chronic kidney disease (CMS-HCC) Following a diabetic diet and medications are tolerated as appropriate.  Uremic symptoms such as nausea and itching are not noted and nephrotoxic medications are being avoided.    Liver cirrhosis secondary to NASH (nonalcoholic steatohepatitis) (CMS/HHS-HCC) Gi is still following   Hyperlipidemia associated with type 2 diabetes mellitus , unspecified (CMS-HCC) Healthy fat diet is being followed and no myalgia's are noted.   Chronic diastolic CHF (congestive heart failure) (CMS/HHS-HCC) Edema is controlled at baseline and sob is not worsening.    B12 deficiency  B12 is followed and controlled   Past Medical History:  Diagnosis Date  . Allergic state   . Anemia    iron  infusions beginning 07/08/2018  . Arthritis   . Asthma, unspecified asthma severity, unspecified whether complicated, unspecified whether persistent (HHS-HCC)   . Blockage of coronary artery of heart (CMS/HHS-HCC)   . Cervical disc disease   .  Chest pain   . Cholecystolithiasis   . Chronic kidney disease   . Colon polyp   . Coronary artery disease   . Diabetes mellitus type 2, uncomplicated (CMS/HHS-HCC)   . Diabetic neuropathy (CMS/HHS-HCC)   . Fibroid   . Foot fracture   . GERD (gastroesophageal reflux disease)   . Heart attack (CMS/HHS-HCC)   . Heart failure, unspecified (CMS/HHS-HCC) 10/16/2020  . Heart murmur   . Hemorrhoids   . History of cataract   . Hyperlipidemia   . Hypertension   . IBS (irritable bowel syndrome)   . Reactive airway disease (HHS-HCC)   . Sleep apnea   . Urinary incontinence, mixed     Past Surgical History:  Procedure Laterality Date  . CORONARY ARTERY BYPASS GRAFT  1993  . HYSTERECTOMY SUPRACERVICAL ABDOMINAL W/REMOVAL TUBES &/OR OVARIES  1995  . CHOLECYSTECTOMY  1997  . Foot fracture repair  1999  . VESICULAR VAGINAL FISTULA  2002  . BACK SURGERY  2002   Neck Surgery- Dr Amon Morita  . ENDOSCOPIC CARPAL TUNNEL RELEASE  2004  . COLONOSCOPY  03/15/2003  . EXCISION OF MASS  03/05/2004  . LEFT TOTAL KNEE REPLACEMENT  04/20/2006  . COLONOSCOPY  01/04/2008   To repeat 01/04/2008...AW, CMA**  . ARTHROSCOPIC ROTATOR CUFF REPAIR  07/18/2010  . COLONOSCOPY  06/01/2012   Within normal limits..Repeat 10 years/[place]..AW, CMA**  . EGD  06/01/2012   Multiple gastric polyps..No Repeat/[place]..AW, CMA**  . COLONOSCOPY  11/29/2017   Normal colon/Repeat 60yrs/MUS  . EGD  11/29/2017   Hyperplastic gastric polyps/Repeat 6 to 8 weeks/MUS  . EGD  03/07/2018  Gastric polyps/Repeat 47yr/MUS  . EGD  08/16/2019   Multiple gastric polyps/No Repeat/TKT  . Left total knee revision arthroplasty   05/07/2021   Dr Mardee  . BLADDER SURGERY  1996  . CARDIAC CATHETERIZATION    . CHOLECYSTECTOMY    . CORONARY ANGIOPLASTY    . DILATION AND CURETTAGE OF UTERUS  1968  . HYSTERECTOMY    . KNEE ARTHROSCOPY  2007  . OOPHORECTOMY    . PTCA and stent of the RCA       No fever chills or sweats   No nausea,  vomiting or diarrhea  No chest pain, shortness of breath   Social History   Socioeconomic History  . Marital status: Widowed  . Number of children: 2  . Years of education: 20  . Highest education level: High school graduate  Occupational History  . Occupation: Retired  Tobacco Use  . Smoking status: Former    Current packs/day: 0.00    Types: Cigarettes    Quit date: 12/01/1970    Years since quitting: 53.1  . Smokeless tobacco: Never  Vaping Use  . Vaping status: Never Used  Substance and Sexual Activity  . Alcohol use: No  . Drug use: No  . Sexual activity: Never   Social Drivers of Health   Food Insecurity: No Food Insecurity (09/13/2022)   Received from [facility]   Hunger Vital Sign   . Within the past 12 months, you worried that your food would run out before you got the money to buy more.: Never true   . Within the past 12 months, the food you bought just didn't last and you didn't have money to get more.: Never true  Transportation Needs: No Transportation Needs (09/13/2022)   Received from [facility] - [facility]   . Lack of Transportation (Medical): No   . Lack of Transportation (Non-Medical): No   Received from [facility]   Social Network  Housing Stability: Unknown (10/07/2023)   Housing Stability Vital Sign   . Homeless in the Last Year: No      Current Outpatient Medications:  .  acetaminophen  (TYLENOL ) 500 mg capsule, Take 1 capsule (500 mg total) by mouth every 4 (four) hours as needed for Fever, Disp: , Rfl:  .  albuterol  MDI, PROVENTIL , VENTOLIN , PROAIR , HFA 90 mcg/actuation inhaler, Inhale 2 inhalations into the lungs every 4 (four) hours as needed for Wheezing, Disp: 1 each, Rfl: 0 .  ascorbic acid , vitamin C , (VITAMIN C ) 500 MG tablet, Take 500 mg by mouth once daily, Disp: , Rfl:  .  blood glucose diagnostic (ONETOUCH ULTRA BLUE TEST STRIP) test strip, 3 (three) times daily Dx e11.65, Disp: 300 each, Rfl: 3 .  cholecalciferol   (VITAMIN D3) 1000 unit tablet, Take 1,000 Units by mouth once daily, Disp: , Rfl:  .  CYANOCOBALAMIN , VITAMIN B-12, (VITAMIN B-12 ORAL), Take 2,500 mcg by mouth once daily, Disp: , Rfl:  .  dilTIAZem  (CARDIZEM  CD) 120 MG XR capsule, Take 1 capsule by mouth once daily, Disp: 90 capsule, Rfl: 3 .  fenofibrate  160 MG tablet, Take 1 tablet by mouth once daily, Disp: 60 tablet, Rfl: 11 .  flash glucose sensor (FREESTYLE LIBRE 2 SENSOR) Kit, Use 1 kit every 14 (fourteen) days, Disp: 9 each, Rfl: 3 .  fluticasone-umeclidinium-vilanterol (TRELEGY ELLIPTA) 100-62.5-25 mcg inhaler, Inhale 1 Puff into the lungs once daily, Disp: 1 each, Rfl: 5 .  FREESTYLE LIBRE 2 READER reader, Use to monitor blood glucose.,  Disp: 1 each, Rfl: 1 .  hydrOXYchloroQUINE  (PLAQUENIL ) 200 mg tablet, Take 1 tablet (200 mg total) by mouth once daily, Disp: , Rfl:  .  insulin  GLARGINE (LANTUS ) injection (concentration 100 units/mL), Inject 14 Units subcutaneously at bedtime, Disp: , Rfl:  .  insulin  NPH-REGULAR (NOVOLIN  70/30 U-100 INSULIN ) 100 unit/mL (70-30) injection, Inject 25 units subcutaneously in the morning and 20 units at night., Disp: 30 mL, Rfl: 3 .  lisinopriL  (ZESTRIL ) 5 MG tablet, Take 5 mg by mouth once daily, Disp: , Rfl:  .  loratadine  (CLARITIN ) 10 mg capsule, Take 1 capsule (10 mg total) by mouth once daily, Disp: , Rfl:  .  magnesium  oxide (MAG-OX) 400 mg (241.3 mg magnesium ) tablet, Take 1 tablet by mouth twice daily, Disp: 120 tablet, Rfl: 3 .  multivitamin tablet, Take 1 tablet by mouth once daily, Disp: , Rfl:  .  nitroGLYcerin  (NITROSTAT ) 0.4 MG SL tablet, DISSOLVE ONE TABLET UNDER THE TONGUE EVERY 5 MINUTES AS NEEDED FOR CHEST PAIN.  DO NOT EXCEED A TOTAL OF 3 DOSES IN 15 MINUTES, Disp: 25 tablet, Rfl: 0 .  pantoprazole  (PROTONIX ) 40 MG DR tablet, Take 1 tablet (40 mg total) by mouth 2 (two) times daily, Disp: 180 tablet, Rfl: 3 .  propranoloL  (INDERAL  LA) 60 MG LA capsule, Take 1 capsule by mouth once daily,  Disp: 90 capsule, Rfl: 0 .  SANTYL ointment, Apply topically once daily, Disp: , Rfl:  .  sennosides (SENOKOT) 8.6 mg tablet, Take 1 tablet by mouth once daily, Disp: , Rfl:  .  spironolactone  (ALDACTONE ) 50 MG tablet, Take 1 tablet (50 mg total) by mouth once daily, Disp: 90 tablet, Rfl: 3 .  TORsemide  (DEMADEX ) 100 MG tablet, Take 1 tablet (100 mg total) by mouth once daily, Disp: 90 tablet, Rfl: 3 .  triamcinolone 0.1 % cream, Apply 1 Application topically 2 (two) times daily as needed, Disp: , Rfl:  .  scopolamine (TRANSDERM-SCOP) 1 mg over 3 days patch, Place 1 patch (1 mg total) onto the skin every third day for 180 days, Disp: 10 patch, Rfl: 5  Vitals:   01/18/24 0903  BP: 131/67  Pulse: 78  Resp: 16  Temp:    Body mass index is 24.8 kg/m. No acute distress Lungs; clear to ascultation Heart; Regular rate and rhythm  Abdomen; Soft and flat, normal bowel sounds Extremities; No clubbing, cyanosis or edema  Office Visit on 11/02/2023  Component Date Value Ref Range Status  . Glucose 11/02/2023 434 (H)  70 - 110 mg/dL Final  . Sodium 93/75/7974 132 (L)  136 - 145 mmol/L Final  . Potassium 11/02/2023 3.7  3.6 - 5.1 mmol/L Final  . Chloride 11/02/2023 94 (L)  97 - 109 mmol/L Final  . Carbon Dioxide (CO2) 11/02/2023 27.8  22.0 - 32.0 mmol/L Final  . Urea Nitrogen (BUN) 11/02/2023 24  7 - 25 mg/dL Final  . Creatinine 93/75/7974 1.1  0.6 - 1.1 mg/dL Final  . Glomerular Filtration Rate (eGFR) 11/02/2023 51 (L)  >60 mL/min/1.73sq m Final  . Calcium  11/02/2023 9.3  8.7 - 10.3 mg/dL Final  . AST  93/75/7974 11  8 - 39 U/L Final  . ALT  11/02/2023 9  5 - 38 U/L Final  . Alk Phos (alkaline Phosphatase) 11/02/2023 110 (H)  34 - 104 U/L Final  . Albumin 11/02/2023 3.6  3.5 - 4.8 g/dL Final  . Bilirubin, Total 11/02/2023 1.1  0.3 - 1.2 mg/dL Final  . Protein, Total  11/02/2023 6.1  6.1 - 7.9 g/dL Final  . A/G Ratio 93/75/7974 1.4  1.0 - 5.0 gm/dL Final    ASSESSMENT  AND  PLAN:  Diagnoses and all orders for this visit:  Chronic diastolic CHF (congestive heart failure) (CMS/HHS-HCC) Assessment & Plan: Edema is controlled at baseline and sob is not worsening.     B12 deficiency  Assessment & Plan: B12 is followed and controlled    Hyperlipidemia associated with type 2 diabetes mellitus , unspecified (CMS-HCC) Assessment & Plan: Healthy fat diet is being followed and no myalgia's are noted.    Liver cirrhosis secondary to NASH (nonalcoholic steatohepatitis) (CMS/HHS-HCC) Assessment & Plan: Gi is still following   Orders: -     Ambulatory Referral to Gastroenterology -     Hepatic Function Panel (HFP) -     CBC w/auto Differential (5 Part) -     Basic Metabolic Panel (BMP) -     Prothrombin Time (INR) -     Ammonia ([facility]) -     US  abdomen complete; Future  OSA on CPAP  Type 2 diabetes mellitus with stage 4 chronic kidney disease, with long-term current use of insulin  (CMS/HHS-HCC) Assessment & Plan: Following a diabetic diet and medications are tolerated as appropriate.  Uremic symptoms such as nausea and itching are not noted and nephrotoxic medications are being avoided.    Orders: -     Hemoglobin A1C  Generalized abdominal pain -     US  abdomen complete; Future  Other orders -     scopolamine (TRANSDERM-SCOP) 1 mg over 3 days patch; Place 1 patch (1 mg total) onto the skin every third day for 180 days    Nausea has been a big issue and worse and wt down, confusion and ? If taking meds correct per part time helper. Check above and get her back to GI (though they sent in sertraline  last mo which could cause nausea at current dose). FU next week and ammonia noted

## 2024-01-18 NOTE — ED Notes (Signed)
 Patient in and out cathed at this time. Patient cleaned up, changed out of her clothes, and placed in a hospital gown. Patient placed on purewick. Bed in lowest position with call light in reach.

## 2024-01-18 NOTE — H&P (Incomplete)
 [place]   PATIENT NAME: Rhonda Tyler    MR#:  [id number]  DATE OF BIRTH:  01-19-1941  DATE OF ADMISSION:  01/18/2024  PRIMARY CARE PHYSICIAN: Lenon Layman ORN, MD   Patient is coming from: Home  REQUESTING/REFERRING PHYSICIAN: Clarine Sharper, MD  CHIEF COMPLAINT:   Chief Complaint  Patient presents with   Abnormal Labs    HISTORY OF PRESENT ILLNESS:  Rhonda Tyler is a 83 y.o. female with medical history significant for osteoarthritis, liver cirrhosis, coronary artery disease, coronary artery disease, OSA, type 2 diabetes mellitus with peripheral neuropathy, hypertension and dyslipidemia, who presented to the emergency room with acute onset of nausea with associated pain across her abdomen without bowel.  She denied any fever or chills.  She did not have a bowel movement during the day.  She denied any diarrhea or melena or bright red blood per rectum or constipation.  She has not been feeling well since earlier in the morning.  No chest pain or palpitations.  No cough or wheezing or dyspnea.  No dysuria, oliguria or hematuria or flank pain.  ED Course: Upon presentation to the ER, BP was 143/66 with heart rate of 119 and respiratory rate 26 with otherwise normal vital signs.  Labs revealed hyponatremia 130 and hypochloremia 95 and blood glucose of 419 with creatinine 1.12 and albumin 3.3.  Total bili was 1.4.  CBC showed anemia with hemoglobin 7.2 hematocrit 25.3 down from 13/37.7 on 06/04/2023.  Stool Hemoccult came back negative.  The patient had low RBC indices.  UA showed more than 500 glucose and was otherwise negative. EKG as reviewed by me :  EKG showed atrial fibrillation with rapid ventricular response of 130 with left axis deviation. Imaging: 2 view chest x-ray showed no acute cardiopulmonary disease.  The patient was given 4 mg of IV Zofran  and 1 L bolus of IV normal saline and 7.5 mg of IV Toradol  as well as 2 g of IV Rocephin .  She will be admitted to a medical  telemetry bed for further evaluation and management. PAST MEDICAL HISTORY:   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   Colon polyp    Coronary artery disease    Cutaneous sarcoidosis    Diabetes mellitus without complication (HCC)    Pt takes insulin    Diabetic neuropathy (HCC)    GERD (gastroesophageal reflux disease)    Headache    migaines in the past   Hemorrhoid    History of diabetic neuropathy    Hypercalcemia    Hyperlipemia    Hypertension    IBS (irritable bowel syndrome)    Iron  deficiency anemia    Myocardial infarction (HCC)    Pneumonia    Sleep apnea    NO CPAP    PAST SURGICAL HISTORY:   Past Surgical History:  Procedure Laterality Date   ABDOMINAL HYSTERECTOMY     arthroscopic rotator cuff repair     BACK SURGERY     CARDIAC CATHETERIZATION     CATARACT EXTRACTION W/PHACO Right 01/26/2022   Procedure: CATARACT EXTRACTION PHACO AND INTRAOCULAR LENS PLACEMENT (IOC) RIGHT DIABETIC;  Surgeon: Myrna Adine Anes, MD;  Location: [facility];  Service: Ophthalmology;  Laterality: Right;  2.90 0:29.3   CATARACT EXTRACTION W/PHACO Left 02/09/2022   Procedure: CATARACT EXTRACTION PHACO AND INTRAOCULAR LENS PLACEMENT (IOC) LEFT DIABETIC 3.54 00:32.3;  Surgeon: Myrna Adine Anes, MD;  Location: [facility];  Service: Ophthalmology;  Laterality: Left;  Diabetic   CHOLECYSTECTOMY     COLONOSCOPY     COLONOSCOPY WITH PROPOFOL  N/A 11/29/2017   Procedure: COLONOSCOPY WITH PROPOFOL ;  Surgeon: Gaylyn Gladis PENNER, MD;  Location: [facility] END[facility]COPY;  Service: Endoscopy;  Laterality: N/A;   CORONARY ANGIOPLASTY     PTCA and stent of RCA   CORONARY ARTERY BYPASS GRAFT     ESOPHAGOGASTRODUODENOSCOPY     ESOPHAGOGASTRODUODENOSCOPY (EGD) WITH PROPOFOL  N/A  11/29/2017   Procedure: ESOPHAGOGASTRODUODENOSCOPY (EGD) WITH PROPOFOL ;  Surgeon: Gaylyn Gladis PENNER, MD;  Location: [facility];  Service: Endoscopy;  Laterality: N/A;   ESOPHAGOGASTRODUODENOSCOPY (EGD) WITH PROPOFOL  N/A 03/07/2018   Procedure: ESOPHAGOGASTRODUODENOSCOPY (EGD) WITH PROPOFOL ;  Surgeon: Gaylyn Gladis PENNER, MD;  Location: [facility] ENDOSCOPY;  Service: Endoscopy;  Laterality: N/A;   ESOPHAGOGASTRODUODENOSCOPY (EGD) WITH PROPOFOL  N/A 08/16/2019   Procedure: ESOPHAGOGASTRODUODENOSCOPY (EGD) WITH PROPOFOL ;  Surgeon: Toledo, Ladell POUR, MD;  Location: [facility] ENDOSCOPY;  Service: Gastroenterology;  Laterality: N/A;   EXCISION OF ADNEXAL MASS     JOINT REPLACEMENT  04/2021   left total knee replacement   OOPHORECTOMY     TOTAL KNEE REVISION Left 05/07/2021   Procedure: TOTAL KNEE REVISION;  Surgeon: Mardee Lynwood SQUIBB, MD;  Location: [facility] ORS;  Service: Orthopedics;  Laterality: Left;   vesicular vaginal fistula      SOCIAL HISTORY:   Social History   Tobacco Use   Smoking status: Former    Current packs/day: 0.00    Types: Cigarettes    Quit date: 12/01/1970    Years since quitting: 53.1   Smokeless tobacco: Never  Substance Use Topics   Alcohol use: Not Currently    FAMILY HISTORY:   Family History  Problem Relation Age of Onset   Hypertension Mother    Heart attack Mother    Hypertension Father    Heart attack Father    Skin cancer Father    Heart attack Sister    Breast cancer Neg Hx     DRUG ALLERGIES:   Allergies  Allergen Reactions   Levaquin [Levofloxacin In D5w] Shortness Of Breath   Penicillins Hives   Codeine Other (See Comments)    Hallucinate   Lipitor [Atorvastatin Calcium ] Other (See Comments)    Muscle pain   Lopressor  [Metoprolol  Tartrate] Other (See Comments)    Heart races   Potassium-Containing Compounds Other (See Comments)     Facial flushing   Procardia [Nifedipine] Other (See Comments)    Heart races   Sucralfate Nausea Only    Tramadol Other (See Comments)    Confusion   Allegra [Fexofenadine] Rash   Naltrexone Other (See Comments)    Severe headache, nausea, body flashes.    Sulfa Antibiotics Rash    REVIEW OF SYSTEMS:   ROS As per history of present illness. All pertinent systems were reviewed above. Constitutional, HEENT, cardiovascular, respiratory, GI, GU, musculoskeletal, neuro, psychiatric, endocrine, integumentary and hematologic systems were reviewed and are otherwise negative/unremarkable except for positive findings mentioned above in the HPI.   MEDICATIONS AT HOME:   Prior to Admission medications   Medication Sig Start Date End Date Taking? Authorizing Provider  acetaminophen  (TYLENOL ) 500 MG tablet Take 1 tablet (500 mg total) by mouth every 6 (six) hours as needed.  06/04/23   Janit Kast, PA-C  alum & mag hydroxide-simeth (MAALOX/MYLANTA) 200-200-20 MG/5ML suspension Take 30 mLs by mouth every 4 (four) hours as needed for indigestion or heartburn. 09/22/22   Milissa Tod PARAS, MD  ascorbic acid  (VITAMIN C ) 500 MG tablet Take 1 tablet (500 mg total) by mouth daily. 09/22/22   Milissa Tod PARAS, MD  cholecalciferol  (VITAMIN D3) 25 MCG (1000 UNIT) tablet Take 1,000 Units by mouth daily.    [provider]  Cyanocobalamin  (B-12) 2500 MCG TABS Take 2,500 mcg by mouth daily.    [provider]  diltiazem  (CARDIZEM  CD) 120 MG 24 hr capsule Take 1 capsule (120 mg total) by mouth daily. 10/15/21   Laurita Pillion, MD  fenofibrate  160 MG tablet Take 160 mg by mouth daily.    [provider]  Ferrous Sulfate  (IRON ) 325 (65 Fe) MG TABS Take 1 tablet (325 mg total) by mouth daily. 04/27/23   Babara Call, MD  gabapentin  (NEURONTIN ) 100 MG capsule Take 1 capsule by mouth 3 (three) times daily. 02/19/22   [provider]  hydrOXYzine  (ATARAX ) 25 MG tablet Take 25 mg by mouth daily.    [provider]  insulin  regular (NOVOLIN  R) 100 units/mL injection Inject 1-10 Units into  the skin daily. lunchtime    [provider]  lisinopril  (ZESTRIL ) 5 MG tablet Take 1 tablet (5 mg total) by mouth daily. Hold this medication until you see nephro 11/05/22   Trudy Anthony HERO, MD  loratadine  (CLARITIN ) 10 MG tablet Take 10 mg by mouth daily.    [provider]  magnesium  oxide (MAG-OX) 400 (240 Mg) MG tablet Take 400 mg by mouth 2 (two) times daily. 03/13/21   [provider]  Multiple Vitamin (MULTIVITAMIN WITH MINERALS) TABS tablet Take 1 tablet by mouth at bedtime.    [provider]  nitroGLYCERIN  (NITROSTAT ) 0.4 MG SL tablet Place 0.4 mg under the tongue every 5 (five) minutes as needed for chest pain. Patient not taking: Reported on 04/27/2023    [provider]  ondansetron  (ZOFRAN ) 4 MG tablet Take 4 mg by mouth every 8 (eight) hours as needed for vomiting or nausea. 09/19/20   [provider]  pantoprazole  (PROTONIX ) 40 MG tablet Take 40 mg by mouth 2 (two) times daily.    [provider]  polyethylene glycol powder (GLYCOLAX /MIRALAX ) 17 GM/SCOOP powder Take 17 g by mouth 2 (two) times daily. 09/22/22   Milissa Tod PARAS, MD  promethazine  (PHENERGAN ) 6.25 MG/5ML solution Take 5 mLs (6.25 mg total) by mouth every 6 (six) hours as needed for up to 7 days for nausea or vomiting. 11/05/22 04/27/23  Trudy Anthony HERO, MD  propranolol  ER (INDERAL  LA) 60 MG 24 hr capsule Take 1 capsule (60 mg total) by mouth daily. 10/14/21 04/27/23  Laurita Pillion, MD  senna (SENOKOT) 8.6 MG tablet Take 1 tablet by mouth daily.    [provider]  sertraline  (ZOLOFT ) 100 MG tablet Take 100 mg by mouth daily.    [provider]  spironolactone  (ALDACTONE ) 50 MG tablet Take 1 tablet (50 mg total) by mouth daily. Hold this medication until you see nephro 11/05/22   Trudy Anthony HERO, MD      VITAL SIGNS:  Blood pressure 129/72, pulse (!) 118, temperature 97.9 F (36.6 C), temperature source Oral, resp. rate 20, height 5'  3 (1.6 m), weight 63.5 kg, SpO2 100%.  PHYSICAL EXAMINATION:  Physical Exam  GENERAL:  83 y.o.-year-old patient lying  in the bed with no acute distress.  EYES: Pupils equal, round, reactive to light and accommodation. No scleral icterus. Extraocular muscles intact.  HEENT: Head atraumatic, normocephalic. Oropharynx and nasopharynx clear.  NECK:  Supple, no jugular venous distention. No thyroid  enlargement, no tenderness.  LUNGS: Normal breath sounds bilaterally, no wheezing, rales,rhonchi or crepitation. No use of accessory muscles of respiration.  CARDIOVASCULAR: Regular rate and rhythm, S1, S2 normal. No murmurs, rubs, or gallops.  ABDOMEN: Soft, nondistended, with mild generalized abdominal tenderness mainly in the lower abdomen.  No rebound tenderness guarding or rigidity.. Bowel sounds present. No organomegaly or mass.  EXTREMITIES: No pedal edema, cyanosis, or clubbing.  NEUROLOGIC: Cranial nerves II through XII are intact. Muscle strength 5/5 in all extremities. Sensation intact. Gait not checked.  PSYCHIATRIC: The patient is alert and oriented x 3.  Normal affect and good eye contact. SKIN: No obvious rash, lesion, or ulcer.   LABORATORY PANEL:   CBC Recent Labs  Lab 01/18/24 1756  WBC 5.1  HGB 7.2*  HCT 25.3*  PLT 209   ------------------------------------------------------------------------------------------------------------------  Chemistries  Recent Labs  Lab 01/18/24 1756  NA 130*  K 4.1  CL 95*  CO2 23  GLUCOSE 419*  BUN 13  CREATININE 1.12*  CALCIUM  9.4  AST 25  ALT 12  ALKPHOS 120  BILITOT 1.4*   ------------------------------------------------------------------------------------------------------------------  Cardiac Enzymes No results for input(s): TROPONINI in the last 168 hours. ------------------------------------------------------------------------------------------------------------------  RADIOLOGY:  CT ABDOMEN PELVIS W  CONTRAST Result Date: 01/18/2024 CLINICAL DATA:  Abdominal pain, anemia, weakness EXAM: CT ABDOMEN AND PELVIS WITH CONTRAST TECHNIQUE: Multidetector CT imaging of the abdomen and pelvis was performed using the standard protocol following bolus administration of intravenous contrast. RADIATION DOSE REDUCTION: This exam was performed according to the departmental dose-optimization program which includes automated exposure control, adjustment of the mA and/or kV according to patient size and/or use of iterative reconstruction technique. CONTRAST:  OMNIPAQUE  IOHEXOL  300 MG/ML  SOLN COMPARISON:  10/29/2022 FINDINGS: Lower chest: Stable subpleural 5 mm right middle lobe pulmonary nodule image 2/2 is unchanged, benign given long-term stability. No specific follow-up recommended. No acute pleural or parenchymal lung disease. Hepatobiliary: No focal liver abnormality is seen. Status post cholecystectomy. No biliary dilatation. Pancreas: Unremarkable. No pancreatic ductal dilatation or surrounding inflammatory changes. Spleen: Normal in size without focal abnormality. Adrenals/Urinary Tract: Stable 2 cm left adrenal nodule containing central coarse calcifications, consistent with benign adenoma. Right adrenal is unremarkable. Kidneys enhance normally and symmetrically. No urinary tract calculi or obstructive uropathy. The bladder is unremarkable. Stomach/Bowel: No bowel obstruction or ileus. Normal appendix right lower quadrant. There is short segment wall thickening of the ascending colon, which may reflect inflammatory or infectious colitis. Moderate stool within the rectal vault. Vascular/Lymphatic: Aortic atherosclerosis. Multiple subcentimeter retroperitoneal lymph nodes. No pathologic adenopathy. Reproductive: Status post hysterectomy. No adnexal masses. Other: Trace upper abdominal ascites. No free intraperitoneal gas. Stable fat containing umbilical hernia. No bowel herniation. Musculoskeletal: No acute or  destructive bony abnormalities. Chronic T11 and L3 compression fractures. Reconstructed images demonstrate no additional findings. IMPRESSION: 1. Segmental wall thickening of the ascending colon, which may reflect inflammatory or infectious colitis. 2. Trace ascites. 3. Fat containing umbilical hernia.  No bowel herniation. 4. Moderate stool in the rectal vault, which could reflect fecal impaction. 5.  Aortic Atherosclerosis (ICD10-I70.0). Electronically Signed   By: Ozell Daring M.D.   On: 01/18/2024 21:01   US  ABDOMEN LIMITED RUQ (LIVER/GB) Result Date: 01/18/2024 CLINICAL DATA:  Upper abdominal  pain, cholecystectomy EXAM: ULTRASOUND ABDOMEN LIMITED RIGHT UPPER QUADRANT COMPARISON:  10/29/2022 FINDINGS: Gallbladder: Surgically absent Common bile duct: Diameter: 4 mm Liver: Mild increased liver echotexture compatible with hepatic steatosis. No focal liver abnormality or intrahepatic biliary duct dilation. Portal vein is patent on color Doppler imaging with normal direction of blood flow towards the liver. Other: Trace free fluid within the right upper quadrant, nonspecific. IMPRESSION: 1. Trace free fluid in the right upper quadrant, nonspecific. 2. Increased liver echotexture consistent with hepatic steatosis. 3. Prior cholecystectomy. Electronically Signed   By: Ozell Daring M.D.   On: 01/18/2024 20:03      IMPRESSION AND PLAN:  Assessment and Plan: * Acute colitis - The patient will be admitted to a medical telemetry bed. - She will be placed on antibiotic therapy with IV Rocephin  and Flagyl . - Will continue hydration with IV LR - She will be placed on clear liquids. - Pain management will be provided.   Sepsis due to undetermined organism [facility]) - This is likely secondary to #1. - Management as above. - She will be hydrated IV lactated Ringer . - Will follow blood cultures.  Uncontrolled type 2 diabetes mellitus with hyperglycemia, with long-term current use of insulin  (HCC) - The  patient will be placed on supplemental coverage with NovoLog . -Will continue hydration as mentioned above.  Iron  deficiency anemia - Stool Hemoccult was negative X1.  Repeat occult blood in stools. - Will place the patient on Protonix . - Will obtain further anemia workup.  Dyslipidemia - Will continue antihyperlipidemics.  Essential hypertension - Will continue antihypertensive therapy.  GERD without esophagitis - We will continue PPI therapy.  Peripheral neuropathy - Will continue Neurontin .  Depression - Will continue Zoloft .    DVT prophylaxis: SCDs.  Given acute colitis and risk of bleeding medical prophylaxis is being held off. Advanced Care Planning:  Code Status: full code. Family Communication:  The plan of care was discussed in details with the patient (and family). I answered all questions. The patient agreed to proceed with the above mentioned plan. Further management will depend upon hospital course. Disposition Plan: Back to previous home environment Consults called: none. All the records are reviewed and case discussed with ED provider.  Status is: Inpatient    At the time of the admission, it appears that the appropriate admission status for this patient is inpatient.  This is judged to be reasonable and necessary in order to provide the required intensity of service to ensure the patient's safety given the presenting symptoms, physical exam findings and initial radiographic and laboratory data in the context of comorbid conditions.  The patient requires inpatient status due to high intensity of service, high risk of further deterioration and high frequency of surveillance required.  I certify that at the time of admission, it is my clinical judgment that the patient will require inpatient hospital care extending more than 2 midnights.                            Dispo: The patient is from: Home              Anticipated d/c is to: Home              Patient  currently is not medically stable to d/c.              Difficult to place patient: No  [place] DELENA Peaches M.D on 01/19/2024 at 6:17 AM  [facility]   From 7 PM-7 AM, contact night-coverage [email or web address]  CC: Primary care physician; Lenon Layman ORN, MD

## 2024-01-18 NOTE — ED Triage Notes (Signed)
 Patients caregiver states patient had lab work at PCP today and was called stating hgb 7.0; denies blood in vomit or stool but has had generalized weakness x 1.5 weeks.

## 2024-01-18 NOTE — ED Notes (Signed)
 Patient to CT at this time

## 2024-01-18 NOTE — ED Notes (Signed)
Patient placed on a purewick.  

## 2024-01-19 DIAGNOSIS — F32A Depression, unspecified: Secondary | ICD-10-CM | POA: Insufficient documentation

## 2024-01-19 DIAGNOSIS — E1165 Type 2 diabetes mellitus with hyperglycemia: Secondary | ICD-10-CM

## 2024-01-19 DIAGNOSIS — E785 Hyperlipidemia, unspecified: Secondary | ICD-10-CM

## 2024-01-19 DIAGNOSIS — G629 Polyneuropathy, unspecified: Secondary | ICD-10-CM

## 2024-01-19 DIAGNOSIS — K529 Noninfective gastroenteritis and colitis, unspecified: Secondary | ICD-10-CM | POA: Diagnosis not present

## 2024-01-19 DIAGNOSIS — K219 Gastro-esophageal reflux disease without esophagitis: Secondary | ICD-10-CM | POA: Insufficient documentation

## 2024-01-19 DIAGNOSIS — A419 Sepsis, unspecified organism: Secondary | ICD-10-CM

## 2024-01-19 LAB — BASIC METABOLIC PANEL WITH GFR
Anion gap: 8 (ref 5–15)
BUN: 12 mg/dL (ref 8–23)
CO2: 27 mmol/L (ref 22–32)
Calcium: 8.5 mg/dL — ABNORMAL LOW (ref 8.9–10.3)
Chloride: 100 mmol/L (ref 98–111)
Creatinine, Ser: 0.95 mg/dL (ref 0.44–1.00)
GFR, Estimated: >60 mL/min (ref >60)
Glucose, Bld: 80 mg/dL (ref 70–99)
Potassium: 3.6 mmol/L (ref 3.5–5.1)
Sodium: 135 mmol/L (ref 135–145)

## 2024-01-19 LAB — RETICULOCYTES
Immature Retic Fract: 17.1 % — ABNORMAL HIGH (ref 2.3–15.9)
RBC.: 3.42 MIL/uL — ABNORMAL LOW (ref 3.87–5.11)
Retic Count, Absolute: 59.9 K/uL (ref 19.0–186.0)
Retic Ct Pct: 1.8 % (ref 0.4–3.1)

## 2024-01-19 LAB — CBC
HCT: 25.3 % — ABNORMAL LOW (ref 36.0–46.0)
Hemoglobin: 7.6 g/dL — ABNORMAL LOW (ref 12.0–15.0)
MCH: 22 pg — ABNORMAL LOW (ref 26.0–34.0)
MCHC: 30 g/dL (ref 30.0–36.0)
MCV: 73.3 fL — ABNORMAL LOW (ref 80.0–100.0)
Platelets: 160 K/uL (ref 150–400)
RBC: 3.45 MIL/uL — ABNORMAL LOW (ref 3.87–5.11)
RDW: 17.7 % — ABNORMAL HIGH (ref 11.5–15.5)
WBC: 4.4 K/uL (ref 4.0–10.5)
nRBC: 0 % (ref 0.0–0.2)

## 2024-01-19 LAB — CBG MONITORING, ED
Glucose-Capillary: 95 mg/dL (ref 70–99)
Glucose-Capillary: 146 mg/dL — ABNORMAL HIGH (ref 70–99)
Glucose-Capillary: 195 mg/dL — ABNORMAL HIGH (ref 70–99)

## 2024-01-19 LAB — PROTIME-INR
INR: 1.2 (ref 0.8–1.2)
Prothrombin Time: 15.6 s — ABNORMAL HIGH (ref 11.4–15.2)

## 2024-01-19 LAB — FOLATE: Folate: 17.9 ng/mL (ref >5.9)

## 2024-01-19 LAB — VITAMIN B12: Vitamin B-12: 1188 pg/mL — ABNORMAL HIGH (ref 180–914)

## 2024-01-19 LAB — HEMOGLOBIN A1C
Hgb A1c MFr Bld: 11.5 % — ABNORMAL HIGH (ref 4.8–5.6)
Mean Plasma Glucose: 283.35 mg/dL

## 2024-01-19 LAB — GLUCOSE, CAPILLARY: Glucose-Capillary: 215 mg/dL — ABNORMAL HIGH (ref 70–99)

## 2024-01-19 LAB — CORTISOL-AM, BLOOD: Cortisol - AM: 7.8 ug/dL (ref 6.7–22.6)

## 2024-01-19 MED ORDER — CHLORHEXIDINE GLUCONATE CLOTH 2 % EX PADS
6.0000 | MEDICATED_PAD | Freq: Every day | CUTANEOUS | Status: DC
  Administered 2024-01-20 – 2024-01-23 (×4): 6 via TOPICAL
  Filled 2024-01-19: qty 6

## 2024-01-19 MED ORDER — INSULIN ASPART 100 UNIT/ML IJ SOLN
0.0000 [IU] | Freq: Every day | INTRAMUSCULAR | Status: DC
  Administered 2024-01-19: 2 [IU] via SUBCUTANEOUS
  Administered 2024-01-20: 3 [IU] via SUBCUTANEOUS
  Administered 2024-01-21 – 2024-01-23 (×3): 4 [IU] via SUBCUTANEOUS
  Filled 2024-01-19 (×5): qty 1

## 2024-01-19 MED ORDER — TORSEMIDE 20 MG PO TABS
100.0000 mg | ORAL_TABLET | Freq: Every day | ORAL | Status: DC
Start: 2024-01-19 — End: 2024-01-24
  Administered 2024-01-19 – 2024-01-24 (×6): 100 mg via ORAL
  Filled 2024-01-19 (×6): qty 5

## 2024-01-19 MED ORDER — INSULIN ASPART 100 UNIT/ML IJ SOLN
0.0000 [IU] | Freq: Three times a day (TID) | INTRAMUSCULAR | Status: DC
  Administered 2024-01-19: 2 [IU] via SUBCUTANEOUS
  Administered 2024-01-20: 9 [IU] via SUBCUTANEOUS
  Administered 2024-01-20: 2 [IU] via SUBCUTANEOUS
  Administered 2024-01-20 – 2024-01-21 (×2): 5 [IU] via SUBCUTANEOUS
  Administered 2024-01-21: 9 [IU] via SUBCUTANEOUS
  Administered 2024-01-22: 7 [IU] via SUBCUTANEOUS
  Administered 2024-01-22: 5 [IU] via SUBCUTANEOUS
  Administered 2024-01-22: 9 [IU] via SUBCUTANEOUS
  Administered 2024-01-23: 7 [IU] via SUBCUTANEOUS
  Administered 2024-01-23: 2 [IU] via SUBCUTANEOUS
  Administered 2024-01-23 – 2024-01-24 (×2): 9 [IU] via SUBCUTANEOUS
  Administered 2024-01-24: 5 [IU] via SUBCUTANEOUS
  Filled 2024-01-19 (×12): qty 1

## 2024-01-19 NOTE — Assessment & Plan Note (Signed)
 -  We will continue PPI therapy

## 2024-01-19 NOTE — Assessment & Plan Note (Signed)
-   This is likely secondary to #1. - Management as above. - She will be hydrated IV lactated Ringer . - Will follow blood cultures.

## 2024-01-19 NOTE — Assessment & Plan Note (Signed)
-   The patient will be placed on supplemental coverage with NovoLog . -Will continue hydration as mentioned above.

## 2024-01-19 NOTE — Assessment & Plan Note (Signed)
Will continue Zoloft. 

## 2024-01-19 NOTE — Assessment & Plan Note (Signed)
-   Will continue Neurontin .

## 2024-01-19 NOTE — Assessment & Plan Note (Addendum)
-   The patient will be admitted to a medical telemetry bed. - She will be placed on antibiotic therapy with IV Rocephin  and Flagyl . - Will continue hydration with IV LR - She will be placed on clear liquids. - Pain management will be provided.

## 2024-01-19 NOTE — Assessment & Plan Note (Signed)
-   Stool Hemoccult was negative X1.  Repeat occult blood in stools. - Will place the patient on Protonix . - Will obtain further anemia workup.

## 2024-01-19 NOTE — Assessment & Plan Note (Signed)
-   Will continue antihypertensive therapy.

## 2024-01-19 NOTE — ED Notes (Signed)
Paper consent signed by patient for blood transfusion

## 2024-01-19 NOTE — Progress Notes (Signed)
 Progress Note   Patient: Rhonda Tyler FMW:[id number] DOB: 07/18/1940 DOA: 01/18/2024     1 DOS: the patient was seen and examined on 01/19/2024   Brief hospital course: From HPI DENYS LABREE is a 83 y.o. female with medical history significant for osteoarthritis, liver cirrhosis, coronary artery disease, coronary artery disease, OSA, type 2 diabetes mellitus with peripheral neuropathy, hypertension and dyslipidemia, who presented to the emergency room with acute onset of nausea with associated pain across her abdomen without bowel.  She denied any fever or chills.  She did not have a bowel movement during the day.  She denied any diarrhea or melena or bright red blood per rectum or constipation.  She has not been feeling well since earlier in the morning.  No chest pain or palpitations.  No cough or wheezing or dyspnea.  No dysuria, oliguria or hematuria or flank pain.   ED Course: Upon presentation to the ER, BP was 143/66 with heart rate of 119 and respiratory rate 26 with otherwise normal vital signs.  Labs revealed hyponatremia 130 and hypochloremia 95 and blood glucose of 419 with creatinine 1.12 and albumin 3.3.  Total bili was 1.4.  CBC showed anemia with hemoglobin 7.2 hematocrit 25.3 down from 13/37.7 on 06/04/2023.  Stool Hemoccult came back negative.  The patient had low RBC indices.  UA showed more than 500 glucose and was otherwise negative. EKG as reviewed by me :  EKG showed atrial fibrillation with rapid ventricular response of 130 with left axis deviation. Imaging: 2 view chest x-ray showed no acute cardiopulmonary disease.    Assessment and Plan: Acute colitis - The patient will be admitted to a medical telemetry bed. - She will be placed on antibiotic therapy with IV Rocephin  and Flagyl . - Will continue hydration with IV LR - She will be placed on clear liquids. - Pain management will be provided.     Sepsis due to undetermined organism [facility]) - This is likely secondary to  #1. - Management as above. - She will be hydrated IV lactated Ringer . - Will follow blood cultures.   Uncontrolled type 2 diabetes mellitus with hyperglycemia, with long-term current use of insulin  (HCC) - The patient will be placed on supplemental coverage with NovoLog . -Will continue hydration as mentioned above.   Iron  deficiency anemia - Stool Hemoccult was negative X1.  Repeat occult blood in stools. - Will place the patient on Protonix . - Will obtain further anemia workup.   Dyslipidemia - Will continue antihyperlipidemics.   Essential hypertension - Will continue antihypertensive therapy.   GERD without esophagitis - We will continue PPI therapy.   Peripheral neuropathy - Will continue Neurontin .   Depression - Will continue Zoloft .       DVT prophylaxis: SCDs.  Given acute colitis and risk of bleeding medical prophylaxis is being held off. Advanced Care Planning:  Code Status: full code. Family Communication:  The plan of care was discussed in details with the patient (and family). I answered all questions. The patient agreed to proceed with the above mentioned plan. Further management will depend upon hospital course. Disposition Plan: Back to previous home environment Consults called: none.   Subjective:  Patient seen and examined at bedside this morning She tells me she is not feeling that well She has some nausea but denies vomiting worsening abdominal pain or diarrhea  Physical Exam:   GENERAL:  83 y.o.-year-old patient lying in the bed with no acute distress.  EYES: Pupils equal, round, reactive  to light and accommodation. No scleral icterus. Extraocular muscles intact.  HEENT: Head atraumatic, normocephalic. Oropharynx and nasopharynx clear.  NECK:  Supple, no jugular venous distention. No thyroid  enlargement, no tenderness.  LUNGS: Normal breath sounds bilaterally, no wheezing, rales,rhonchi or crepitation. No use of accessory muscles of respiration.   CARDIOVASCULAR: Regular rate and rhythm, S1, S2 normal. No murmurs, rubs, or gallops.  ABDOMEN: Soft, nondistended, with mild generalized abdominal tenderness mainly in the lower abdomen.  No rebound tenderness guarding or rigidity.. Bowel sounds present. No organomegaly or mass.  EXTREMITIES: No pedal edema, cyanosis, or clubbing.  NEUROLOGIC: Cranial nerves II through XII are intact. Muscle strength 5/5 in all extremities. Sensation intact. Gait not checked.  PSYCHIATRIC: The patient is alert and oriented x 3.  Normal affect and good eye contact. Vitals:   01/19/24 1400 01/19/24 1407 01/19/24 1530 01/19/24 1605  BP: (!) 108/53  (!) 112/55   Pulse: 70  76   Resp: 19  (!) 23   Temp:  97.6 F (36.4 C)  (!) 97.3 F (36.3 C)  TempSrc:  Oral  Oral  SpO2: 100%  100%   Weight:      Height:        Data Reviewed: CT scan of the abdomen reviewed showing findings of colitis    Latest Ref Rng & Units 01/19/2024    6:30 AM 01/18/2024    5:56 PM 06/04/2023    4:36 PM  CBC  WBC 4.0 - 10.5 K/uL 4.4  5.1  8.7   Hemoglobin 12.0 - 15.0 g/dL 7.6  7.2  86.9   Hematocrit 36.0 - 46.0 % 25.3  25.3  37.7   Platelets 150 - 400 K/uL 160  209  144        Latest Ref Rng & Units 01/19/2024    6:30 AM 01/18/2024    5:56 PM 06/04/2023    4:36 PM  BMP  Glucose 70 - 99 mg/dL 80  580  774   BUN 8 - 23 mg/dL 12  13  43   Creatinine 0.44 - 1.00 mg/dL 9.04  8.87  8.83   Sodium 135 - 145 mmol/L 135  130  138   Potassium 3.5 - 5.1 mmol/L 3.6  4.1  4.1   Chloride 98 - 111 mmol/L 100  95  103   CO2 22 - 32 mmol/L 27  23  18    Calcium  8.9 - 10.3 mg/dL 8.5  9.4  89.9      Family Communication: None present at bedside  Disposition: Status is: Inpatient   Author: Drue ONEIDA Potter, MD 01/19/2024 5:33 PM  For on call review [email or web address].

## 2024-01-19 NOTE — ED Notes (Signed)
 Pt moved to cpod room 34   report off to camilla rn

## 2024-01-19 NOTE — Inpatient Diabetes Management (Addendum)
 Inpatient Diabetes Program Recommendations  AACE/ADA: New Consensus Statement on Inpatient Glycemic Control DM2  Target Ranges:  Prepandial:   less than 140 mg/dL      Peak postprandial:   less than 180 mg/dL (1-2 hours)      Critically ill patients:  140 - 180 mg/dL    Latest Reference Range & Units 01/18/24 17:56 01/19/24 06:30  Glucose 70 - 99 mg/dL 580 (H) 80   Review of Glycemic Control  Diabetes history: DM2 Outpatient Diabetes medications: 70/30 20-25 units BID with breakfast and supper, Regular 0-20 units daily with lunch Current orders for Inpatient glycemic control: None  Inpatient Diabetes Program Recommendations:    Insulin :  Please consider ordering CBGs AC&HS and Novolog  0-6 units TID with meals and Novolog  0-5 units at bedtime.  NOTE: Patient is currently in ED admitted with colitis, sepsis, and anemia. Lab glucose 419 mg/dl on 0/0/74$MzfnczAzqnmzIZPI_AteaoVVZEDreIjzfvcRiktsefcwuSWMi$$MzfnczAzqnmzIZPI_AteaoVVZEDreIjzfvcRiktsefcwuSWMi$ :56 and 80 mg/dl this morning. NO insulin  given since arrival and no insulin  ordered.  Patient sees Dr. Cherilyn (Endocrinologist) and was last seen on 07/06/23. Per office note on 07/06/23 patient takes Relion 70/30 insulin , 20 units before breakfast and 25 units before supper), and 5-6 units of regular with lunch.   Addendum 01/19/24@11 :55-Spoke with patient at bedside in ED and patient was alert and able to provide DM control information. Patient states that she is taking 70/30 20-25 units with breakfast and lunch and also taking Regular 0-20 units with lunch. Patient is using FreeStyle Libre CGM for glucose monitoring. Patient reports that glucose is usually in 100's but goes up during the day especially depending on what she eats. Patient reports that she took 70/30 yesterday morning with breakfast and she does not think she took any Regular with lunch yesterday since she was feeling well. Discussed that while inpatient, we will start out using Novolog  insulin  and follow glucose trends to decide if we need to add basal insulin . Patient  verbalized understanding of information and has no questions at this time.  Thanks, Earnie Gainer, RN, MSN, CDCES Diabetes Coordinator Inpatient Diabetes Program [phone] (Team Pager from 8am to 5pm)

## 2024-01-19 NOTE — Assessment & Plan Note (Signed)
-   Will continue antihyperlipidemics.

## 2024-01-19 NOTE — ED Notes (Signed)
 1st unit of PRBC's started on pt.  Pt signed consent.  Afib on monitor.  2 iv's in place.  Siderails up x 2.

## 2024-01-20 DIAGNOSIS — K529 Noninfective gastroenteritis and colitis, unspecified: Secondary | ICD-10-CM | POA: Diagnosis not present

## 2024-01-20 LAB — CBC WITH DIFFERENTIAL/PLATELET
Abs Immature Granulocytes: 0.03 K/uL (ref 0.00–0.07)
Basophils Absolute: 0 K/uL (ref 0.0–0.1)
Basophils Relative: 0 %
Eosinophils Absolute: 0.3 K/uL (ref 0.0–0.5)
Eosinophils Relative: 5 %
HCT: 28.2 % — ABNORMAL LOW (ref 36.0–46.0)
Hemoglobin: 8.4 g/dL — ABNORMAL LOW (ref 12.0–15.0)
Immature Granulocytes: 1 %
Lymphocytes Relative: 16 %
Lymphs Abs: 0.9 K/uL (ref 0.7–4.0)
MCH: 21.7 pg — ABNORMAL LOW (ref 26.0–34.0)
MCHC: 29.8 g/dL — ABNORMAL LOW (ref 30.0–36.0)
MCV: 72.9 fL — ABNORMAL LOW (ref 80.0–100.0)
Monocytes Absolute: 0.5 K/uL (ref 0.1–1.0)
Monocytes Relative: 9 %
Neutro Abs: 3.8 K/uL (ref 1.7–7.7)
Neutrophils Relative %: 69 %
Platelets: 176 K/uL (ref 150–400)
RBC: 3.87 MIL/uL (ref 3.87–5.11)
RDW: 18 % — ABNORMAL HIGH (ref 11.5–15.5)
WBC: 5.4 K/uL (ref 4.0–10.5)
nRBC: 0.4 % — ABNORMAL HIGH (ref 0.0–0.2)

## 2024-01-20 LAB — BASIC METABOLIC PANEL WITH GFR
Anion gap: 9 (ref 5–15)
BUN: 12 mg/dL (ref 8–23)
CO2: 26 mmol/L (ref 22–32)
Calcium: 8.3 mg/dL — ABNORMAL LOW (ref 8.9–10.3)
Chloride: 100 mmol/L (ref 98–111)
Creatinine, Ser: 1.08 mg/dL — ABNORMAL HIGH (ref 0.44–1.00)
GFR, Estimated: 52 mL/min — ABNORMAL LOW (ref >60)
Glucose, Bld: 200 mg/dL — ABNORMAL HIGH (ref 70–99)
Potassium: 3.7 mmol/L (ref 3.5–5.1)
Sodium: 135 mmol/L (ref 135–145)

## 2024-01-20 LAB — GLUCOSE, CAPILLARY
Glucose-Capillary: 194 mg/dL — ABNORMAL HIGH (ref 70–99)
Glucose-Capillary: 315 mg/dL — ABNORMAL HIGH (ref 70–99)
Glucose-Capillary: 354 mg/dL — ABNORMAL HIGH (ref 70–99)
Glucose-Capillary: 293 mg/dL — ABNORMAL HIGH (ref 70–99)

## 2024-01-20 LAB — TYPE AND SCREEN
ABO/RH(D): A POS
Antibody Screen: NEGATIVE
Unit division: 0

## 2024-01-20 LAB — BPAM RBC
Blood Product Expiration Date: 202509212359
ISSUE DATE / TIME: 202509100019
Unit Type and Rh: 202509212359
Unit Type and Rh: 600

## 2024-01-20 NOTE — Inpatient Diabetes Management (Signed)
 Inpatient Diabetes Program Recommendations  AACE/ADA: New Consensus Statement on Inpatient Glycemic Control (2015)  Target Ranges:  Prepandial:   less than 140 mg/dL      Peak postprandial:   less than 180 mg/dL (1-2 hours)      Critically ill patients:  140 - 180 mg/dL    Latest Reference Range & Units 01/20/24 07:41 01/20/24 12:19  Glucose-Capillary 70 - 99 mg/dL 805 (H)  2 units Novolog   315 (H)  (H): Data is abnormally high    Home DM Meds: 70/30 Insulin  20-25 units BID with breakfast and supper Regular 0-20 units daily with lunch  Current Orders: Novolog  Sensitive Correction Scale/ SSI (0-9 units) TID AC + HS    MD- Note CBG 315 at 12pm today  Can we change her Novolog  SSi coverage to Q4 hours?  May also need small dose of basal insulin --Takes 70/30 Insulin  BID at home but not appropriate for 70/30 insulin  yet as only on CL diet  Please consider starting Lantus  10 units at bedtime (0.15 units/kg)     --Will follow patient during hospitalization--  Adina Rudolpho Arrow RN, MSN, CDCES Diabetes Coordinator Inpatient Glycemic Control Team Team Pager: [phone] (8a-5p)

## 2024-01-20 NOTE — Consult Note (Addendum)
 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   POC discussed with bedside nurse. WOC team will not follow. Re-consult if further needs arise.   Thank you,    Powell Bar MSN, RN-BC, [employer]

## 2024-01-20 NOTE — Care Management Important Message (Signed)
 Important Message  Patient Details  Name: KARIANNE NOGUEIRA MRN: [id number] Date of Birth: 01/02/1941   Important Message Given:  Yes - Medicare IM     Rojelio SHAUNNA Rattler 01/20/2024, 12:38 PM

## 2024-01-20 NOTE — Progress Notes (Signed)
 Progress Note   Patient: Rhonda Tyler FMW:[id number] DOB: [birth year] DOA: 01/18/2024     2 DOS: the patient was seen and examined on 01/20/2024     Brief hospital course: From HPI ILANNA DEIHL is a 83 y.o. female with medical history significant for osteoarthritis, liver cirrhosis, coronary artery disease, coronary artery disease, OSA, type 2 diabetes mellitus with peripheral neuropathy, hypertension and dyslipidemia, who presented to the emergency room with acute onset of nausea with associated pain across her abdomen without bowel.  She denied any fever or chills.  She did not have a bowel movement during the day.  She denied any diarrhea or melena or bright red blood per rectum or constipation.  She has not been feeling well since earlier in the morning.  No chest pain or palpitations.  No cough or wheezing or dyspnea.  No dysuria, oliguria or hematuria or flank pain.   ED Course: Upon presentation to the ER, BP was 143/66 with heart rate of 119 and respiratory rate 26 with otherwise normal vital signs.  Labs revealed hyponatremia 130 and hypochloremia 95 and blood glucose of 419 with creatinine 1.12 and albumin 3.3.  Total bili was 1.4.  CBC showed anemia with hemoglobin 7.2 hematocrit 25.3 down from 13/37.7 on 06/04/2023.  Stool Hemoccult came back negative.  The patient had low RBC indices.  UA showed more than 500 glucose and was otherwise negative. EKG as reviewed by me :  EKG showed atrial fibrillation with rapid ventricular response of 130 with left axis deviation. Imaging: 2 view chest x-ray showed no acute cardiopulmonary disease.     Assessment and Plan: Acute colitis - The patient will be admitted to a medical telemetry bed. - Continue with IV Rocephin  and Flagyl . - Will continue hydration with IV LR - She will be placed on clear liquids. - Pain management will be provided.     Sepsis due to undetermined organism [facility]) - This is likely secondary to #1. - Management as  above. - She will be hydrated IV lactated Ringer . - Will follow blood cultures.   Uncontrolled type 2 diabetes mellitus with hyperglycemia, with long-term current use of insulin  (HCC) - The patient will be placed on supplemental coverage with NovoLog . -Will continue hydration as mentioned above.   Iron  deficiency anemia - Stool Hemoccult was negative X1.  Repeat occult blood in stools. - Will place the patient on Protonix . - Follow-up on anemia panel   Dyslipidemia - Will continue antihyperlipidemics.   Essential hypertension - Will continue antihypertensive therapy.   GERD without esophagitis - We will continue PPI therapy.   Peripheral neuropathy - Will continue Neurontin .   Depression - Will continue Zoloft .       DVT prophylaxis: SCDs.  Given acute colitis and risk of bleeding medical prophylaxis is being held off.  Advanced Care Planning:  Code Status: full code.  Disposition Plan: Back to previous home environment Consults called: none.     Subjective:  Admits to some improvement in her general condition compared to upon presentation Denies nausea vomiting She did have some diarrhea   Physical Exam:     GENERAL:  83 y.o.-year-old patient lying in the bed with no acute distress.  EYES: Pupils equal, round, reactive to light and accommodation. No scleral icterus. Extraocular muscles intact.  HEENT: Head atraumatic, normocephalic. Oropharynx and nasopharynx clear.  NECK:  Supple, no jugular venous distention. No thyroid  enlargement, no tenderness.  LUNGS: Normal breath sounds bilaterally, no wheezing, rales,rhonchi or crepitation.  No use of accessory muscles of respiration.  CARDIOVASCULAR: Regular rate and rhythm, S1, S2 normal. No murmurs, rubs, or gallops.  ABDOMEN: Soft, nondistended, with mild generalized abdominal tenderness mainly in the lower abdomen.  No rebound tenderness guarding or rigidity.. Bowel sounds present. No organomegaly or mass.   EXTREMITIES: No pedal edema, cyanosis, or clubbing.  NEUROLOGIC: Cranial nerves II through XII are intact. Muscle strength 5/5 in all extremities. Sensation intact. Gait not checked.  PSYCHIATRIC: The patient is alert and oriented x 3.  Normal affect and good eye contact.  Data Reviewed: CT scan of the abdomen reviewed showing findings of colitis   Vitals:   01/19/24 2049 01/20/24 0418 01/20/24 0704 01/20/24 1417  BP: (!) 121/53 (!) 114/45 112/62 116/64  Pulse: 62 66 72 63  Resp: 16 16 16 16   Temp: 98.4 F (36.9 C) 98.4 F (36.9 C) 97.6 F (36.4 C) (!) 97.5 F (36.4 C)  TempSrc: Oral Oral Oral   SpO2: 100% 99% 96% 92%  Weight:      Height:          Latest Ref Rng & Units 01/20/2024    4:27 AM 01/19/2024    6:30 AM 01/18/2024    5:56 PM  CBC  WBC 4.0 - 10.5 K/uL 5.4  4.4  5.1   Hemoglobin 12.0 - 15.0 g/dL 8.4  7.6  7.2   Hematocrit 36.0 - 46.0 % 28.2  25.3  25.3   Platelets 150 - 400 K/uL 176  160  209        Latest Ref Rng & Units 01/20/2024    4:27 AM 01/19/2024    6:30 AM 01/18/2024    5:56 PM  BMP  Glucose 70 - 99 mg/dL 799  80  580   BUN 8 - 23 mg/dL 12  12  13    Creatinine 0.44 - 1.00 mg/dL 8.91  9.04  8.87   Sodium 135 - 145 mmol/L 135  135  130   Potassium 3.5 - 5.1 mmol/L 3.7  3.6  4.1   Chloride 98 - 111 mmol/L 100  100  95   CO2 22 - 32 mmol/L 26  27  23    Calcium  8.9 - 10.3 mg/dL 8.3  8.5  9.4      Author: Drue ONEIDA Potter, MD 01/20/2024 5:47 PM  For on call review [email or web address].

## 2024-01-20 NOTE — Plan of Care (Signed)

## 2024-01-21 DIAGNOSIS — K529 Noninfective gastroenteritis and colitis, unspecified: Secondary | ICD-10-CM | POA: Diagnosis not present

## 2024-01-21 LAB — CBC WITH DIFFERENTIAL/PLATELET
Abs Immature Granulocytes: 0.02 K/uL (ref 0.00–0.07)
Basophils Absolute: 0 K/uL (ref 0.0–0.1)
Basophils Relative: 0 %
Eosinophils Absolute: 0.2 K/uL (ref 0.0–0.5)
Eosinophils Relative: 4 %
HCT: 27.1 % — ABNORMAL LOW (ref 36.0–46.0)
Hemoglobin: 8 g/dL — ABNORMAL LOW (ref 12.0–15.0)
Immature Granulocytes: 0 %
Lymphocytes Relative: 21 %
Lymphs Abs: 0.9 K/uL (ref 0.7–4.0)
MCH: 21.4 pg — ABNORMAL LOW (ref 26.0–34.0)
MCHC: 29.5 g/dL — ABNORMAL LOW (ref 30.0–36.0)
MCV: 72.5 fL — ABNORMAL LOW (ref 80.0–100.0)
Monocytes Absolute: 0.5 K/uL (ref 0.1–1.0)
Monocytes Relative: 11 %
Neutro Abs: 2.8 K/uL (ref 1.7–7.7)
Neutrophils Relative %: 64 %
Platelets: 163 K/uL (ref 150–400)
RBC: 3.74 MIL/uL — ABNORMAL LOW (ref 3.87–5.11)
RDW: 19.2 % — ABNORMAL HIGH (ref 11.5–15.5)
WBC: 4.5 K/uL (ref 4.0–10.5)
nRBC: 0 % (ref 0.0–0.2)

## 2024-01-21 LAB — GLUCOSE, CAPILLARY
Glucose-Capillary: 264 mg/dL — ABNORMAL HIGH (ref 70–99)
Glucose-Capillary: 382 mg/dL — ABNORMAL HIGH (ref 70–99)
Glucose-Capillary: 342 mg/dL — ABNORMAL HIGH (ref 70–99)

## 2024-01-21 LAB — BASIC METABOLIC PANEL WITH GFR
Anion gap: 8 (ref 5–15)
BUN: 15 mg/dL (ref 8–23)
CO2: 30 mmol/L (ref 22–32)
Calcium: 8.3 mg/dL — ABNORMAL LOW (ref 8.9–10.3)
Chloride: 100 mmol/L (ref 98–111)
Creatinine, Ser: 1.05 mg/dL — ABNORMAL HIGH (ref 0.44–1.00)
GFR, Estimated: 53 mL/min — ABNORMAL LOW (ref >60)
Glucose, Bld: 156 mg/dL — ABNORMAL HIGH (ref 70–99)
Potassium: 4.3 mmol/L (ref 3.5–5.1)
Sodium: 138 mmol/L (ref 135–145)

## 2024-01-21 NOTE — Plan of Care (Signed)
  Problem: Fluid Volume: Goal: Hemodynamic stability will improve Outcome: Progressing   Problem: Clinical Measurements: Goal: Diagnostic test results will improve Outcome: Progressing Goal: Signs and symptoms of infection will decrease Outcome: Progressing   Problem: Respiratory: Goal: Ability to maintain adequate ventilation will improve Outcome: Progressing   Problem: Education: Goal: Ability to describe self-care measures that may prevent or decrease complications (Diabetes Survival Skills Education) will improve Outcome: Progressing Goal: Individualized Educational Video(s) Outcome: Progressing   Problem: Fluid Volume: Goal: Ability to maintain a balanced intake and output will improve Outcome: Progressing   Problem: Health Behavior/Discharge Planning: Goal: Ability to identify and utilize available resources and services will improve Outcome: Progressing Goal: Ability to manage health-related needs will improve Outcome: Progressing

## 2024-01-21 NOTE — Inpatient Diabetes Management (Addendum)
 Inpatient Diabetes Program Recommendations  AACE/ADA: New Consensus Statement on Inpatient Glycemic Control (2015)  Target Ranges:  Prepandial:   less than 140 mg/dL      Peak postprandial:   less than 180 mg/dL (1-2 hours)      Critically ill patients:  140 - 180 mg/dL    Latest Reference Range & Units 01/19/24 06:30  Hemoglobin A1C 4.8 - 5.6 % 11.5 (H)  283 mg/dl  (H): Data is abnormally high  Latest Reference Range & Units 01/20/24 07:41 01/20/24 12:19 01/20/24 16:43 01/20/24 21:35  Glucose-Capillary 70 - 99 mg/dL 805 (H)  2 units Novolog   315 (H)  5 units Novolog   354 (H)  9 units Novolog   293 (H)  3 units Novolog    (H): Data is abnormally high  Latest Reference Range & Units 01/21/24 13:08  Glucose-Capillary 70 - 99 mg/dL 735 (H)  (H): Data is abnormally high  Admit: Acute colitis/ Sepsis  Home DM Meds: 70/30 Insulin  20-25 units BID with breakfast and supper Regular 0-20 units daily with lunch  Patient states that she is taking 70/30 Insulin  (20-25 units with breakfast and lunch) and Taking Regular 0-20 units with lunch. Patient is using FreeStyle Libre CGM for glucose monitoring.    Current Orders: Novolog  Sensitive Correction Scale/ SSI (0-9 units) TID AC + HS     MD- No CBG taken this AM.  CBG 264 at Lunch  Please consider starting Lantus  10 units at bedtime (0.15 units/kg)    Met w/ pt at bedside today to review current A1c of 11.5%.  Explained what an A1c is and what it measures.  Reminded patient that her goal A1c is 7-8% or less per ADA standards to prevent both acute and long-term complications.  Explained to patient the extreme importance of good glucose control at home.  Encouraged patient to continue to check her CBGs at least TID AC at home (pt has traditional fingerstick meter) and to record all CBGs in a logbook for her PCP to review.  Pt sees Dr. Lenon with [facility] and Dr. Cherilyn for ENDO at [facility].   --Will follow patient during  hospitalization--  Adina Rudolpho Arrow RN, MSN, CDCES Diabetes Coordinator Inpatient Glycemic Control Team Team Pager: [phone] (8a-5p)

## 2024-01-21 NOTE — Progress Notes (Signed)
 Progress Note   Patient: Rhonda Tyler FMW:[id number] DOB: 05/28/1940 DOA: 01/18/2024     3 DOS: the patient was seen and examined on 01/21/2024    Brief hospital course: From HPI MAVI UN is a 83 y.o. female with medical history significant for osteoarthritis, liver cirrhosis, coronary artery disease, coronary artery disease, OSA, type 2 diabetes mellitus with peripheral neuropathy, hypertension and dyslipidemia, who presented to the emergency room with acute onset of nausea with associated pain across her abdomen without bowel.  She denied any fever or chills.  She did not have a bowel movement during the day.  She denied any diarrhea or melena or bright red blood per rectum or constipation.  She has not been feeling well since earlier in the morning.  No chest pain or palpitations.  No cough or wheezing or dyspnea.  No dysuria, oliguria or hematuria or flank pain.   ED Course: Upon presentation to the ER, BP was 143/66 with heart rate of 119 and respiratory rate 26 with otherwise normal vital signs.  Labs revealed hyponatremia 130 and hypochloremia 95 and blood glucose of 419 with creatinine 1.12 and albumin 3.3.  Total bili was 1.4.  CBC showed anemia with hemoglobin 7.2 hematocrit 25.3 down from 13/37.7 on 06/04/2023.  Stool Hemoccult came back negative.  The patient had low RBC indices.  UA showed more than 500 glucose and was otherwise negative. EKG as reviewed by me :  EKG showed atrial fibrillation with rapid ventricular response of 130 with left axis deviation. Imaging: 2 view chest x-ray showed no acute cardiopulmonary disease.     Assessment and Plan: Acute colitis with acute diarrhea - The patient will be admitted to a medical telemetry bed. - Continue with IV Rocephin  and Flagyl . - Will continue hydration with IV LR - Advance diet as tolerated Follow-up with GI panel - Pain management will be provided.     Sepsis due to undetermined organism [facility]) - This is likely secondary  to #1. - Management as above. - She will be hydrated IV lactated Ringer . - Will follow blood cultures.   Uncontrolled type 2 diabetes mellitus with hyperglycemia, with long-term current use of insulin  (HCC) - The patient will be placed on supplemental coverage with NovoLog . -Will continue hydration as mentioned above.   Iron  deficiency anemia - Stool Hemoccult was negative X1.  Repeat occult blood in stools. - Will place the patient on Protonix . - Follow-up on anemia panel   Dyslipidemia - Will continue antihyperlipidemics.   Essential hypertension - Will continue antihypertensive therapy.   GERD without esophagitis - We will continue PPI therapy.   Peripheral neuropathy - Will continue Neurontin .   Depression - Will continue Zoloft .       DVT prophylaxis: SCDs.  Given acute colitis and risk of bleeding medical prophylaxis is being held off.   Advanced Care Planning:  Code Status: full code.   Disposition Plan: Back to previous home environment Consults called: none.     Subjective:  Patient admits to having some watery diarrhea GI panel requested Denies nausea vomiting chest pain or cough   Physical Exam:     GENERAL:  83 y.o.-year-old patient lying in the bed with no acute distress.  EYES: Pupils equal, round, reactive to light and accommodation. No scleral icterus. Extraocular muscles intact.  HEENT: Head atraumatic, normocephalic. Oropharynx and nasopharynx clear.  NECK:  Supple, no jugular venous distention. No thyroid  enlargement, no tenderness.  LUNGS: Normal breath sounds bilaterally, no wheezing, rales,rhonchi  or crepitation. No use of accessory muscles of respiration.  CARDIOVASCULAR: Regular rate and rhythm, S1, S2 normal. No murmurs, rubs, or gallops.  ABDOMEN: Soft and nontender no masses palpable EXTREMITIES: No pedal edema, cyanosis, or clubbing.  NEUROLOGIC: Cranial nerves II through XII are intact. Muscle strength 5/5 in all extremities.  Sensation intact. Gait not checked.  PSYCHIATRIC: The patient is alert and oriented x 3.  Normal affect and good eye contact.   Data Reviewed:    Latest Ref Rng & Units 01/21/2024    5:20 AM 01/20/2024    4:27 AM 01/19/2024    6:30 AM  BMP  Glucose 70 - 99 mg/dL 843  799  80   BUN 8 - 23 mg/dL 15  12  12    Creatinine 0.44 - 1.00 mg/dL 8.94  8.91  9.04   Sodium 135 - 145 mmol/L 138  135  135   Potassium 3.5 - 5.1 mmol/L 4.3  3.7  3.6   Chloride 98 - 111 mmol/L 100  100  100   CO2 22 - 32 mmol/L 30  26  27    Calcium  8.9 - 10.3 mg/dL 8.3  8.3  8.5     Vitals:   01/20/24 1417 01/20/24 2013 01/21/24 0344 01/21/24 0953  BP: 116/64 118/64 (!) 124/50 (!) 114/46  Pulse: 63   72  Resp: 16 16 16 16   Temp: (!) 97.5 F (36.4 C) 97.8 F (36.6 C) 97.9 F (36.6 C) 97.8 F (36.6 C)  TempSrc:    Oral  SpO2: 92% 94% 98% 100%  Weight:      Height:        Author: Drue ONEIDA Potter, MD 01/21/2024 4:42 PM  For on call review [email or web address].

## 2024-01-21 NOTE — TOC Initial Note (Signed)
 Transition of Care [facility]) - Initial/Assessment Note    Patient Details  Name: Rhonda Tyler MRN: [id number] Date of Birth: 1941-03-14  Transition of Care [facility]) CM/SW Contact:    Alfonso Rummer, LCSW Phone Number: 01/21/2024, 1:41 PM  Clinical Narrative:                 Pt reports she lives alone however she hired a private duty home health aide to render care. Ms. Soltis reports Dr. Layman Piety is her pcp. Pt has walker, cane, safety measures in the bathroom and no stairs in the home.  Ms. Grey reports extended family lives in [place] and [place], [place]. Pt reports she does not drive and does not have any barriers getting to scheduled doctors appt or affording medication. Pt reports her preferred pharmacy is [facility].    Barriers to Discharge: Continued Medical Work up   Patient Goals and CMS Choice            Expected Discharge Plan and Services In-house Referral: Clinical Social Work Discharge Planning Services: CM Consult   Living arrangements for the past 2 months: Single Family Home                                      Prior Living Arrangements/Services Living arrangements for the past 2 months: Single Family Home Lives with:: Other (Comment) (Pt lives alone) Patient language and need for interpreter reviewed:: No Do you feel safe going back to the place where you live?: Yes      Need for Family Participation in Patient Care: No (Comment) Care giver support system in place?: Yes (comment) Current home services: Other (comment) (Pt reports she hired a Designer, fashion/clothing) Criminal Activity/Legal Involvement Pertinent to Current Situation/Hospitalization: No - Comment as needed  Activities of Daily Living      Permission Sought/Granted                  Emotional Assessment Appearance:: Appears stated age Attitude/Demeanor/Rapport: Engaged Affect (typically observed): Appropriate Orientation: : Oriented to Self, Oriented to  Place, Oriented to  Time   Psych Involvement: No (comment)  Admission diagnosis:  Colitis [K52.9] Chronic atrial fibrillation (HCC) [I48.20] Acute colitis [K52.9] Symptomatic anemia [D64.9] Type 2 diabetes mellitus without complication, with long-term current use of insulin  (HCC) [E11.9, Z79.4] Nausea and vomiting, unspecified vomiting type [R11.2] Patient Active Problem List   Diagnosis Date Noted   Sepsis due to undetermined organism (HCC) 01/19/2024   Uncontrolled type 2 diabetes mellitus with hyperglycemia, with long-term current use of insulin  (HCC) 01/19/2024   Depression 01/19/2024   Dyslipidemia 01/19/2024   Peripheral neuropathy 01/19/2024   GERD without esophagitis 01/19/2024   Acute colitis 01/18/2024   Edema of right lower extremity 12/29/2022   Muscle twitching 11/02/2022   Acute metabolic encephalopathy 11/02/2022   Chronic diastolic CHF (congestive heart failure) (HCC) 10/30/2022   Ground-level fall 10/29/2022   Humerus fracture 10/29/2022   Leukocytosis 10/29/2022   Pneumonia 09/13/2022   Elevated CK 09/13/2022   Generalized weakness 09/13/2022   Hyponatremia 10/14/2021   Hyperlipemia    Type II diabetes mellitus with renal manifestations (HCC)    SVT (supraventricular tachycardia) (HCC)    Elevated troponin    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   Periprosthetic fracture around internal prosthetic left knee joint 05/05/2021   Idiopathic gout, unspecified site 11/20/2020   Itching 11/20/2020  Anemia in chronic kidney disease 10/16/2020   Hyperparathyroidism due to renal insufficiency (HCC) 10/16/2020   Proteinuria, unspecified 10/16/2020   Chronic kidney disease, stage 4 (severe) (HCC) 06/03/2020   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 ([address]), [place] - [address] [place]) [place] [address] Phone: [phone] Fax: [phone]  [facility] [address] [place] [address] Phone: [phone] Fax: [phone]     Social Drivers of Health (SDOH) Social History: SDOH Screenings   Food Insecurity: No Food Insecurity (09/13/2022)  Housing: Unknown (10/07/2023)   [facility]  Transportation Needs: No Transportation Needs (09/13/2022)  Utilities: Not At Risk (09/13/2022)  Social Connections: Unknown (09/21/2021)   Received from [facility]  Tobacco Use: Medium Risk (01/18/2024)   SDOH Interventions:     Readmission Risk Interventions    09/15/2022   10:21 AM  Readmission Risk Prevention Plan  Transportation Screening Complete  PCP or Specialist Appt within 3-5 Days Complete  HRI or Home Care Consult Complete  Social Work Consult for Recovery Care Planning/Counseling Complete  Palliative Care Screening Complete  Medication Review [employer]) Complete

## 2024-01-22 DIAGNOSIS — K529 Noninfective gastroenteritis and colitis, unspecified: Secondary | ICD-10-CM | POA: Diagnosis not present

## 2024-01-22 LAB — CBC WITH DIFFERENTIAL/PLATELET
Abs Immature Granulocytes: 0.03 K/uL (ref 0.00–0.07)
Basophils Absolute: 0 K/uL (ref 0.0–0.1)
Basophils Relative: 0 %
Eosinophils Absolute: 0.2 K/uL (ref 0.0–0.5)
Eosinophils Relative: 3 %
HCT: 26.6 % — ABNORMAL LOW (ref 36.0–46.0)
Hemoglobin: 8.1 g/dL — ABNORMAL LOW (ref 12.0–15.0)
Immature Granulocytes: 1 %
Lymphocytes Relative: 21 %
Lymphs Abs: 1 K/uL (ref 0.7–4.0)
MCH: 21.7 pg — ABNORMAL LOW (ref 26.0–34.0)
MCHC: 30.5 g/dL (ref 30.0–36.0)
MCV: 71.1 fL — ABNORMAL LOW (ref 80.0–100.0)
Monocytes Absolute: 0.4 K/uL (ref 0.1–1.0)
Monocytes Relative: 9 %
Neutro Abs: 3.2 K/uL (ref 1.7–7.7)
Neutrophils Relative %: 66 %
Platelets: 143 K/uL — ABNORMAL LOW (ref 150–400)
RBC: 3.74 MIL/uL — ABNORMAL LOW (ref 3.87–5.11)
RDW: 20.1 % — ABNORMAL HIGH (ref 11.5–15.5)
WBC: 4.8 K/uL (ref 4.0–10.5)
nRBC: 0 % (ref 0.0–0.2)

## 2024-01-22 LAB — BASIC METABOLIC PANEL WITH GFR
Anion gap: 18 — ABNORMAL HIGH (ref 5–15)
BUN: 19 mg/dL (ref 8–23)
CO2: 26 mmol/L (ref 22–32)
Calcium: 8.7 mg/dL — ABNORMAL LOW (ref 8.9–10.3)
Chloride: 91 mmol/L — ABNORMAL LOW (ref 98–111)
Creatinine, Ser: 1.37 mg/dL — ABNORMAL HIGH (ref 0.44–1.00)
GFR, Estimated: 39 mL/min — ABNORMAL LOW (ref >60)
Glucose, Bld: 290 mg/dL — ABNORMAL HIGH (ref 70–99)
Potassium: 4.4 mmol/L (ref 3.5–5.1)
Sodium: 135 mmol/L (ref 135–145)

## 2024-01-22 LAB — GLUCOSE, CAPILLARY
Glucose-Capillary: 259 mg/dL — ABNORMAL HIGH (ref 70–99)
Glucose-Capillary: 424 mg/dL — ABNORMAL HIGH (ref 70–99)
Glucose-Capillary: 315 mg/dL — ABNORMAL HIGH (ref 70–99)
Glucose-Capillary: 304 mg/dL — ABNORMAL HIGH (ref 70–99)

## 2024-01-22 MED ORDER — INSULIN GLARGINE 100 UNIT/ML ~~LOC~~ SOLN
10.0000 [IU] | Freq: Every day | SUBCUTANEOUS | Status: DC
  Administered 2024-01-22 – 2024-01-23 (×2): 10 [IU] via SUBCUTANEOUS
  Filled 2024-01-22 (×3): qty 0.1

## 2024-01-22 MED ORDER — INSULIN GLARGINE 100 UNITS/ML SOLOSTAR PEN
10.0000 [IU] | PEN_INJECTOR | SUBCUTANEOUS | Status: DC
  Filled 2024-01-22: qty 3

## 2024-01-22 MED ORDER — INSULIN ASPART 100 UNIT/ML IJ SOLN
6.0000 [IU] | Freq: Once | INTRAMUSCULAR | Status: AC
  Administered 2024-01-22: 6 [IU] via SUBCUTANEOUS
  Filled 2024-01-22: qty 1

## 2024-01-22 NOTE — Progress Notes (Signed)
 Progress Note   Patient: Rhonda Tyler FMW:[id number] DOB: 01-31-1941 DOA: 01/18/2024     4 DOS: the patient was seen and examined on 01/22/2024    Brief hospital course: From HPI Rhonda Tyler is a 83 y.o. female with medical history significant for osteoarthritis, liver cirrhosis, coronary artery disease, coronary artery disease, OSA, type 2 diabetes mellitus with peripheral neuropathy, hypertension and dyslipidemia, who presented to the emergency room with acute onset of nausea with associated pain across her abdomen without bowel.  She denied any fever or chills.  She did not have a bowel movement during the day.  She denied any diarrhea or melena or bright red blood per rectum or constipation.  She has not been feeling well since earlier in the morning.  No chest pain or palpitations.  No cough or wheezing or dyspnea.  No dysuria, oliguria or hematuria or flank pain.   ED Course: Upon presentation to the ER, BP was 143/66 with heart rate of 119 and respiratory rate 26 with otherwise normal vital signs.  Labs revealed hyponatremia 130 and hypochloremia 95 and blood glucose of 419 with creatinine 1.12 and albumin 3.3.  Total bili was 1.4.  CBC showed anemia with hemoglobin 7.2 hematocrit 25.3 down from 13/37.7 on 06/04/2023.  Stool Hemoccult came back negative.  The patient had low RBC indices.  UA showed more than 500 glucose and was otherwise negative. EKG as reviewed by me :  EKG showed atrial fibrillation with rapid ventricular response of 130 with left axis deviation. Imaging: 2 view chest x-ray showed no acute cardiopulmonary disease.     Assessment and Plan: Acute colitis with acute diarrhea - The patient will be admitted to a medical telemetry bed. - Continue with IV Rocephin  and Flagyl . - Will continue hydration with IV LR - Advance diet as tolerated Follow-up with GI panel - Pain management will be provided.     Sepsis due to undetermined organism [facility]) - This is likely secondary  to #1. - Management as above. Patient received IV fluid - Will follow blood cultures.   Uncontrolled type 2 diabetes mellitus with hyperglycemia, with long-term current use of insulin  (HCC) - The patient will be placed on supplemental coverage with NovoLog . -Will continue hydration as mentioned above.   Iron  deficiency anemia - Stool Hemoccult was negative X1.  Repeat occult blood in stools. - Will place the patient on Protonix . - Follow-up on anemia panel   Dyslipidemia - Will continue antihyperlipidemics.   Essential hypertension - Will continue antihypertensive therapy.   GERD without esophagitis - We will continue PPI therapy.   Peripheral neuropathy - Will continue Neurontin .   Depression - Will continue Zoloft .       DVT prophylaxis: SCDs.  Given acute colitis and risk of bleeding medical prophylaxis is being held off.   Advanced Care Planning:  Code Status: full code.   Disposition Plan: Back to previous home environment Consults called: none.     Subjective:  She admits to improvement in diarrhea Denies nausea vomiting abdominal pain or chest pain   Physical Exam:     GENERAL:  83 y.o.-year-old patient lying in the bed with no acute distress.  EYES: Pupils equal, round, reactive to light and accommodation. No scleral icterus. Extraocular muscles intact.  HEENT: Head atraumatic, normocephalic. Oropharynx and nasopharynx clear.  NECK:  Supple, no jugular venous distention. No thyroid  enlargement, no tenderness.  LUNGS: Normal breath sounds bilaterally, no wheezing, rales,rhonchi or crepitation. No use of accessory muscles  of respiration.  CARDIOVASCULAR: Regular rate and rhythm, S1, S2 normal. No murmurs, rubs, or gallops.  ABDOMEN: Soft and nontender no masses palpable EXTREMITIES: No pedal edema, cyanosis, or clubbing.  NEUROLOGIC: Cranial nerves II through XII are intact. Muscle strength 5/5 in all extremities. Sensation intact. Gait not checked.   PSYCHIATRIC: The patient is alert and oriented x 3.  Normal affect and good eye contact.   Data Reviewed:  Vitals:   01/21/24 2013 01/22/24 0320 01/22/24 0823 01/22/24 1543  BP: (!) 116/50 99/75 (!) 109/42 (!) 125/97  Pulse: 65 70 66 70  Resp: 20 20 18 16   Temp: 98.1 F (36.7 C) 97.7 F (36.5 C) 98.7 F (37.1 C) 98.3 F (36.8 C)  TempSrc: Oral Oral    SpO2: 100% 97% 95% 95%  Weight:      Height:          Latest Ref Rng & Units 01/22/2024    7:03 AM 01/21/2024    5:20 AM 01/20/2024    4:27 AM  CBC  WBC 4.0 - 10.5 K/uL 4.8  4.5  5.4   Hemoglobin 12.0 - 15.0 g/dL 8.1  8.0  8.4   Hematocrit 36.0 - 46.0 % 26.6  27.1  28.2   Platelets 150 - 400 K/uL 143  163  176        Latest Ref Rng & Units 01/22/2024    8:01 AM 01/21/2024    5:20 AM 01/20/2024    4:27 AM  BMP  Glucose 70 - 99 mg/dL 709  843  799   BUN 8 - 23 mg/dL 19  15  12    Creatinine 0.44 - 1.00 mg/dL 8.62  8.94  8.91   Sodium 135 - 145 mmol/L 135  138  135   Potassium 3.5 - 5.1 mmol/L 4.4  4.3  3.7   Chloride 98 - 111 mmol/L 91  100  100   CO2 22 - 32 mmol/L 26  30  26    Calcium  8.9 - 10.3 mg/dL 8.7  8.3  8.3      Author: Drue ONEIDA Potter, MD 01/22/2024 4:26 PM  For on call review [email or web address].

## 2024-01-23 DIAGNOSIS — K529 Noninfective gastroenteritis and colitis, unspecified: Secondary | ICD-10-CM | POA: Diagnosis not present

## 2024-01-23 LAB — GLUCOSE, CAPILLARY
Glucose-Capillary: 193 mg/dL — ABNORMAL HIGH (ref 70–99)
Glucose-Capillary: 314 mg/dL — ABNORMAL HIGH (ref 70–99)
Glucose-Capillary: 386 mg/dL — ABNORMAL HIGH (ref 70–99)
Glucose-Capillary: 325 mg/dL — ABNORMAL HIGH (ref 70–99)

## 2024-01-23 LAB — BASIC METABOLIC PANEL WITH GFR
Anion gap: 12 (ref 5–15)
BUN: 21 mg/dL (ref 8–23)
CO2: 27 mmol/L (ref 22–32)
Calcium: 8.2 mg/dL — ABNORMAL LOW (ref 8.9–10.3)
Chloride: 92 mmol/L — ABNORMAL LOW (ref 98–111)
Creatinine, Ser: 1.3 mg/dL — ABNORMAL HIGH (ref 0.44–1.00)
GFR, Estimated: 41 mL/min — ABNORMAL LOW (ref >60)
Glucose, Bld: 244 mg/dL — ABNORMAL HIGH (ref 70–99)
Potassium: 3.9 mmol/L (ref 3.5–5.1)
Sodium: 131 mmol/L — ABNORMAL LOW (ref 135–145)

## 2024-01-23 MED ORDER — OXYCODONE HCL 5 MG PO TABS: 5.0000 mg | ORAL_TABLET | Freq: Four times a day (QID) | ORAL | Status: DC | PRN

## 2024-01-23 NOTE — Evaluation (Signed)
 Physical Therapy Evaluation Patient Details Name: TAUNIA FRASCO MRN: [id number] DOB: July 31, 1940 Today's Date: 01/23/2024  History of Present Illness  Pt is an 83 yo female that presented to ED for nausea, abdominal pain. PMH of OA, OSA, DM, neuropathy, HTN, HLD. Workup showed afib with RVR, acute colitis, sepsis.  Clinical Impression  Pt A&Ox4, agreeable to PT evaluation, denied pain throughout session. At baseline, pt reports being modI with mobility, intermittent RW use for amb, and has a PCA that comes during the week who assists with cooking, cleaning, and transportation. Pt was met seated in recliner, completed 2 STS from recliner with RW and supervision. Pt amb to bathroom at start of session with RW and CGA, supervision for STS from toilet and for pericare. Pt able to stand at sink to wash hands and assist with brief management with fair standing balance and CGA. Pt amb ~166ft with RW and CGA,1 encouraged standing rest break due to pt reporting SOB, no LOB, VC to maintain BOS within RW frame. Pt was left seated in recliner at end of session, all needs in reach, RN present in room. Pt would benefit from skilled PT intervention to address listed deficits (see PT problem list) and ensure safe return to PLOF.       If plan is discharge home, recommend the following: A little help with walking and/or transfers;A lot of help with bathing/dressing/bathroom;Assistance with cooking/housework;Direct supervision/assist for medications management;Assist for transportation;Help with stairs or ramp for entrance   Can travel by private vehicle        Equipment Recommendations None recommended by PT (pt has recommended DME)  Recommendations for Other Services       Functional Status Assessment Patient has had a recent decline in their functional status and demonstrates the ability to make significant improvements in function in a reasonable and predictable amount of time.     Precautions /  Restrictions Precautions Precautions: Fall Recall of Precautions/Restrictions: Intact Precaution/Restrictions Comments: enteric precautions Restrictions Weight Bearing Restrictions Per Provider Order: No      Mobility  Bed Mobility               General bed mobility comments: NT this session, pt in recliner at start/end of session    Transfers Overall transfer level: Needs assistance Equipment used: Rolling walker (2 wheels) Transfers: Sit to/from Stand Sit to Stand: Supervision           General transfer comment: 2 STS from recliner, 1 from toilet, each with RW and supervision with VC for hand placement    Ambulation/Gait Ambulation/Gait assistance: Contact guard assist Gait Distance (Feet): 100 Feet Assistive device: Rolling walker (2 wheels) Gait Pattern/deviations: Step-through pattern, Trunk flexed, Narrow base of support Gait velocity: decreased     General Gait Details: no LOB, slowed cadence, VC to maintain BOS within RW frame. 1 standing rest break encouraged due to pt citing SOB  Stairs            Wheelchair Mobility     Tilt Bed    Modified Rankin (Stroke Patients Only)       Balance Overall balance assessment: Needs assistance Sitting-balance support: Feet supported Sitting balance-Leahy Scale: Normal Sitting balance - Comments: steady static and dynamic sitting   Standing balance support: No upper extremity supported, Bilateral upper extremity supported Standing balance-Leahy Scale: Fair Standing balance comment: able to perform pericare in standing and assist with donning brief with CGA  Pertinent Vitals/Pain Pain Assessment Pain Assessment: No/denies pain    Home Living Family/patient expects to be discharged to:: Private residence Living Arrangements: Alone Available Help at Discharge: Personal care attendant;Available PRN/intermittently;Neighbor Type of Home: House Home Access:  Stairs to enter Entrance Stairs-Rails: None Entrance Stairs-Number of Steps: 1 clearance   Home Layout: One level Home Equipment: Agricultural consultant (2 wheels);Cane - single point Additional Comments: PCA comes 5 days a week    Prior Function Prior Level of Function : Needs assist;Independent/Modified Independent             Mobility Comments: pt cites intermittent RW use, no falls ADLs Comments: pt states PCA assists with cooking, housework, driving     Extremity/Trunk Assessment   Upper Extremity Assessment Upper Extremity Assessment: Defer to OT evaluation    Lower Extremity Assessment Lower Extremity Assessment: Generalized weakness       Communication   Communication Communication: Impaired Factors Affecting Communication: Hearing impaired    Cognition Arousal: Alert Behavior During Therapy: WFL for tasks assessed/performed   PT - Cognitive impairments: No apparent impairments                       PT - Cognition Comments: requires frequent redirection to task Following commands: Intact       Cueing Cueing Techniques: Verbal cues, Visual cues     General Comments      Exercises Other Exercises Other Exercises: HR in low 90s after completing amb, SpO2 88-90% on RA end of session   Assessment/Plan    PT Assessment Patient needs continued PT services  PT Problem List Decreased strength;Decreased activity tolerance;Decreased balance;Decreased mobility;Decreased knowledge of use of DME       PT Treatment Interventions DME instruction;Gait training;Stair training;Functional mobility training;Therapeutic activities;Therapeutic exercise;Balance training;Neuromuscular re-education;Cognitive remediation;Patient/family education    PT Goals (Current goals can be found in the Care Plan section)  Acute Rehab PT Goals Patient Stated Goal: to return home PT Goal Formulation: With patient Time For Goal Achievement: 02/06/24 Potential to Achieve Goals:  Good    Frequency Min 2X/week     Co-evaluation               AM-PAC PT 6 Clicks Mobility  Outcome Measure Help needed turning from your back to your side while in a flat bed without using bedrails?: None Help needed moving from lying on your back to sitting on the side of a flat bed without using bedrails?: A Little Help needed moving to and from a bed to a chair (including a wheelchair)?: A Little Help needed standing up from a chair using your arms (e.g., wheelchair or bedside chair)?: A Little Help needed to walk in hospital room?: A Little Help needed climbing 3-5 steps with a railing? : A Lot 6 Click Score: 18    End of Session   Activity Tolerance: Patient tolerated treatment well Patient left: in chair;with call bell/phone within reach;with chair alarm set;with nursing/sitter in room Nurse Communication: Mobility status PT Visit Diagnosis: Unsteadiness on feet (R26.81);Other abnormalities of gait and mobility (R26.89);Muscle weakness (generalized) (M62.81);Difficulty in walking, not elsewhere classified (R26.2)    Time: 8963-8894 PT Time Calculation (min) (ACUTE ONLY): 29 min   Charges:   PT Evaluation $PT Eval Low Complexity: 1 Low PT Treatments $Therapeutic Activity: 8-22 mins PT General Charges $$ ACUTE PT VISIT: 1 Visit         Janell Axe, SPT

## 2024-01-23 NOTE — Evaluation (Signed)
 Occupational Therapy Evaluation Patient Details Name: Rhonda Tyler MRN: [id number] DOB: [birth year] Today's Date: 01/23/2024   History of Present Illness   Pt is an 83 yo female that presented to ED for nausea, abdominal pain. PMH of OA, OSA, DM, neuropathy, HTN, HLD. Workup showed afib with RVR, acute colitis, sepsis.   Clinical Impressions Ms How was seen for OT evaluation this date. Prior to hospital admission, pt was MOD I using RW as needed. Pt lives alone with PCA on weekdays for IADL assistance. Pt currently requires SBA + RW for ADL t/f ~25 ft. SBA standing grooming tasks. SUPERVISION bed mobility, good balance sitting EOB reaching outside BOS. Pt would benefit from skilled OT to address noted impairments and functional limitations (see below for any additional details). Upon hospital discharge, recommend OT follow up and use of RW on return home.    If plan is discharge home, recommend the following:   Help with stairs or ramp for entrance     Functional Status Assessment   Patient has had a recent decline in their functional status and demonstrates the ability to make significant improvements in function in a reasonable and predictable amount of time.     Equipment Recommendations   BSC/3in1     Recommendations for Other Services         Precautions/Restrictions   Precautions Precautions: Fall Recall of Precautions/Restrictions: Intact Restrictions Weight Bearing Restrictions Per Provider Order: No     Mobility Bed Mobility Overal bed mobility: Needs Assistance Bed Mobility: Supine to Sit     Supine to sit: Supervision          Transfers Overall transfer level: Needs assistance Equipment used: Rolling walker (2 wheels) Transfers: Sit to/from Stand Sit to Stand: Supervision                  Balance Overall balance assessment: Needs assistance Sitting-balance support: No upper extremity supported, Feet supported Sitting balance-Leahy  Scale: Normal     Standing balance support: No upper extremity supported, During functional activity Standing balance-Leahy Scale: Fair                             ADL either performed or assessed with clinical judgement   ADL Overall ADL's : Needs assistance/impaired                                       General ADL Comments: SBA + RW for ADL t/f ~25 ft. SBA standing grooming tasks.     Vision         Perception         Praxis         Pertinent Vitals/Pain Pain Assessment Pain Assessment: No/denies pain     Extremity/Trunk Assessment Upper Extremity Assessment Upper Extremity Assessment: Overall WFL for tasks assessed   Lower Extremity Assessment Lower Extremity Assessment: Generalized weakness       Communication Communication Communication: Impaired Factors Affecting Communication: Hearing impaired   Cognition Arousal: Alert Behavior During Therapy: WFL for tasks assessed/performed Cognition: Cognition impaired       Memory impairment (select all impairments): Short-term memory                       Following commands: Intact       Cueing  General Comments  Exercises     Shoulder Instructions      Home Living Family/patient expects to be discharged to:: Private residence Living Arrangements: Alone Available Help at Discharge: Personal care attendant;Available PRN/intermittently;Neighbor Type of Home: House Home Access: Stairs to enter [employer] of Steps: 1   Home Layout: One level               Home Equipment: Agricultural consultant (2 wheels);Cane - single point          Prior Functioning/Environment Prior Level of Function : Independent/Modified Independent             Mobility Comments: uses RW as needed ADLs Comments: PCA visits on weekdays for IADLs and driving, son on weekends    OT Problem List: Decreased range of motion;Decreased activity  tolerance;Decreased strength   OT Treatment/Interventions: Self-care/ADL training;Therapeutic exercise;Energy conservation;DME and/or AE instruction;Therapeutic activities;Patient/family education;Balance training      OT Goals(Current goals can be found in the care plan section)   Acute Rehab OT Goals Patient Stated Goal: to go home OT Goal Formulation: With patient Time For Goal Achievement: 02/06/24 Potential to Achieve Goals: Good ADL Goals Pt Will Perform Grooming: with modified independence;standing Pt Will Perform Lower Body Dressing: with modified independence;sit to/from stand Pt Will Transfer to Toilet: with modified independence;ambulating;regular height toilet   OT Frequency:  Min 3X/week    Co-evaluation              AM-PAC OT 6 Clicks Daily Activity     Outcome Measure Help from another person eating meals?: None Help from another person taking care of personal grooming?: A Little Help from another person toileting, which includes using toliet, bedpan, or urinal?: A Little Help from another person bathing (including washing, rinsing, drying)?: A Lot Help from another person to put on and taking off regular upper body clothing?: A Little Help from another person to put on and taking off regular lower body clothing?: A Lot 6 Click Score: 17   End of Session Equipment Utilized During Treatment: Rolling walker (2 wheels)  Activity Tolerance: Patient tolerated treatment well Patient left: in chair;with call bell/phone within reach;with chair alarm set  OT Visit Diagnosis: Other abnormalities of gait and mobility (R26.89);Muscle weakness (generalized) (M62.81)                Time: 9079-9060 OT Time Calculation (min): 19 min Charges:  OT General Charges $OT Visit: 1 Visit OT Evaluation $OT Eval Low Complexity: 1 Low OT Treatments $Self Care/Home Management : 8-22 mins  Elston Slot, M.S. OTR/L  01/23/24, 10:30 AM  ascom [phone]

## 2024-01-23 NOTE — Progress Notes (Signed)
 Progress Note   Patient: Rhonda Tyler FMW:[id number] DOB: [birth year] DOA: 01/18/2024     5 DOS: the patient was seen and examined on 01/23/2024      Brief hospital course: From HPI JANUS VLCEK is a 83 y.o. female with medical history significant for osteoarthritis, liver cirrhosis, coronary artery disease, coronary artery disease, OSA, type 2 diabetes mellitus with peripheral neuropathy, hypertension and dyslipidemia, who presented to the emergency room with acute onset of nausea with associated pain across her abdomen without bowel.  She denied any fever or chills.  She did not have a bowel movement during the day.  She denied any diarrhea or melena or bright red blood per rectum or constipation.  She has not been feeling well since earlier in the morning.  No chest pain or palpitations.  No cough or wheezing or dyspnea.  No dysuria, oliguria or hematuria or flank pain.   ED Course: Upon presentation to the ER, BP was 143/66 with heart rate of 119 and respiratory rate 26 with otherwise normal vital signs.  Labs revealed hyponatremia 130 and hypochloremia 95 and blood glucose of 419 with creatinine 1.12 and albumin 3.3.  Total bili was 1.4.  CBC showed anemia with hemoglobin 7.2 hematocrit 25.3 down from 13/37.7 on 06/04/2023.  Stool Hemoccult came back negative.  The patient had low RBC indices.  UA showed more than 500 glucose and was otherwise negative. EKG as reviewed by me :  EKG showed atrial fibrillation with rapid ventricular response of 130 with left axis deviation. Imaging: 2 view chest x-ray showed no acute cardiopulmonary disease.     Assessment and Plan: Acute colitis with acute diarrhea - The patient will be admitted to a medical telemetry bed. Will complete a course of ceftriaxone  and Flagyl  - Will continue hydration with IV LR - Advance diet as tolerated Follow-up with GI panel - Pain management will be provided.     Sepsis due to undetermined organism [facility]) - This is  likely secondary to #1. - Management as above. Patient received IV fluid - Will follow blood cultures.   Uncontrolled type 2 diabetes mellitus with hyperglycemia, with long-term current use of insulin  (HCC) - The patient will be placed on supplemental coverage with NovoLog . -Will continue hydration as mentioned above.   Iron  deficiency anemia - Stool Hemoccult was negative X1.  Repeat occult blood in stools. - Will place the patient on Protonix . - Follow-up on anemia panel   Dyslipidemia - Will continue antihyperlipidemics.   Essential hypertension - Will continue antihypertensive therapy.   GERD without esophagitis - We will continue PPI therapy.   Peripheral neuropathy - Will continue Neurontin .   Depression - Will continue Zoloft .       DVT prophylaxis: SCDs.  Given acute colitis and risk of bleeding medical prophylaxis is being held off.   Advanced Care Planning:  Code Status: full code.   Disposition Plan: Back to previous home environment Consults called: none.     Subjective:  She admits to improvement in diarrhea Patient continues to deny nausea vomiting abdominal pain or chest pain   Physical Exam:     GENERAL:  83 y.o.-year-old patient lying in the bed with no acute distress.  EYES: Pupils equal, round, reactive to light and accommodation. No scleral icterus. Extraocular muscles intact.  HEENT: Head atraumatic, normocephalic. Oropharynx and nasopharynx clear.  NECK:  Supple, no jugular venous distention. No thyroid  enlargement, no tenderness.  LUNGS: Normal breath sounds bilaterally, no wheezing, rales,rhonchi or  crepitation. No use of accessory muscles of respiration.  CARDIOVASCULAR: Regular rate and rhythm, S1, S2 normal. No murmurs, rubs, or gallops.  ABDOMEN: Soft and nontender no masses palpable EXTREMITIES: No pedal edema, cyanosis, or clubbing.  NEUROLOGIC: Cranial nerves II through XII are intact. Muscle strength 5/5 in all extremities.  Sensation intact. Gait not checked.  PSYCHIATRIC: The patient is alert and oriented x 3.  Normal affect and good eye contact.   Data Reviewed:      Latest Ref Rng & Units 01/22/2024    7:03 AM 01/21/2024    5:20 AM 01/20/2024    4:27 AM  CBC  WBC 4.0 - 10.5 K/uL 4.8  4.5  5.4   Hemoglobin 12.0 - 15.0 g/dL 8.1  8.0  8.4   Hematocrit 36.0 - 46.0 % 26.6  27.1  28.2   Platelets 150 - 400 K/uL 143  163  176       Vitals:   01/22/24 1930 01/23/24 0347 01/23/24 0840 01/23/24 1417  BP: 137/67 (!) 109/91 120/62 126/81  Pulse: 75 82 78 74  Resp: 18 16 18 16   Temp: 98 F (36.7 C) 98.5 F (36.9 C) 98.2 F (36.8 C) 98 F (36.7 C)  TempSrc:   Oral Oral  SpO2: 100% 90% 100% 100%  Weight:      Height:        Author: Drue ONEIDA Potter, MD 01/23/2024 3:10 PM  For on call review [email or web address].

## 2024-01-24 DIAGNOSIS — K529 Noninfective gastroenteritis and colitis, unspecified: Secondary | ICD-10-CM | POA: Diagnosis not present

## 2024-01-24 LAB — GASTROINTESTINAL PANEL BY PCR, STOOL (REPLACES STOOL CULTURE)

## 2024-01-24 LAB — GLUCOSE, CAPILLARY
Glucose-Capillary: 341 mg/dL — ABNORMAL HIGH (ref 70–99)
Glucose-Capillary: 313 mg/dL — ABNORMAL HIGH (ref 70–99)
Glucose-Capillary: 275 mg/dL — ABNORMAL HIGH (ref 70–99)

## 2024-01-24 MED ORDER — INSULIN GLARGINE 100 UNIT/ML ~~LOC~~ SOLN
15.0000 [IU] | Freq: Every day | SUBCUTANEOUS | Status: DC
  Administered 2024-01-24: 15 [IU] via SUBCUTANEOUS
  Filled 2024-01-24: qty 0.15

## 2024-01-24 NOTE — Discharge Summary (Signed)
 Physician Discharge Summary   Patient: Rhonda Tyler MRN: [id number] DOB: [birth year]  Admit date:     01/18/2024  Discharge date: 01/24/24  Discharge Physician: Drue ONEIDA Potter   PCP: Lenon Layman ORN, MD   Recommendations at discharge:  Follow-up with PCP  Discharge Diagnoses: Principal Problem:   Acute colitis Active Problems:   Sepsis due to undetermined organism (HCC)   Iron  deficiency anemia   Uncontrolled type 2 diabetes mellitus with hyperglycemia, with long-term current use of insulin  (HCC)   Essential hypertension   Dyslipidemia   Depression   Peripheral neuropathy   GERD without esophagitis  Resolved Problems:   * No resolved hospital problems. [facility] Course:   From HPI Rhonda Tyler is a 83 y.o. female with medical history significant for osteoarthritis, liver cirrhosis, coronary artery disease, coronary artery disease, OSA, type 2 diabetes mellitus with peripheral neuropathy, hypertension and dyslipidemia, who presented to the emergency room with acute onset of nausea with associated pain across her abdomen without bowel.  She denied any fever or chills.  She did not have a bowel movement during the day.  She denied any diarrhea or melena or bright red blood per rectum or constipation.  She has not been feeling well since earlier in the morning.  No chest pain or palpitations.  No cough or wheezing or dyspnea.  No dysuria, oliguria or hematuria or flank pain.   ED Course: Upon presentation to the ER, BP was 143/66 with heart rate of 119 and respiratory rate 26 with otherwise normal vital signs.  Labs revealed hyponatremia 130 and hypochloremia 95 and blood glucose of 419 with creatinine 1.12 and albumin 3.3.  Total bili was 1.4.  CBC showed anemia with hemoglobin 7.2 hematocrit 25.3 down from 13/37.7 on 06/04/2023.  Stool Hemoccult came back negative.  The patient had low RBC indices.  UA showed more than 500 glucose and was otherwise negative. EKG as reviewed by me  :  EKG showed atrial fibrillation with rapid ventricular response of 130 with left axis deviation. Imaging: 2 view chest x-ray showed no acute cardiopulmonary disease.     Other hospital course as noted below:  Assessment and Plan: Acute colitis with acute diarrhea - The patient will be admitted to a medical telemetry bed. Has completed a course of ceftriaxone  and Flagyl  Received IV hydration GI panel was negative Abdominal pain resolved     Sepsis due to undetermined organism [facility]) - This is likely secondary to #1. - Management as above. Patient received IV fluid   Uncontrolled type 2 diabetes mellitus with hyperglycemia, with long-term current use of insulin  (HCC) - The patient will be placed on supplemental coverage with NovoLog . Continue oral hydration as well as glucose monitoring   Iron  deficiency anemia - Stool Hemoccult was negative X1.  Repeat occult blood in stools.    Dyslipidemia - Will continue antihyperlipidemics.   Essential hypertension - Will continue antihypertensive therapy.   GERD without esophagitis - We will continue PPI therapy.   Peripheral neuropathy - Will continue Neurontin .   Depression - Will continue Zoloft .   Consultants: None Procedures performed: None Disposition: Home health Diet recommendation:  Cardiac and Carb modified diet DISCHARGE MEDICATION: Allergies as of 01/24/2024       Reactions   Levaquin [levofloxacin In D5w] Shortness Of Breath   Penicillins Hives   Codeine Other (See Comments)   Hallucinate   Lipitor [atorvastatin Calcium ] Other (See Comments)   Muscle pain   Lopressor  [metoprolol   Tartrate] Other (See Comments)   Heart races   Potassium-containing Compounds Other (See Comments)    Facial flushing   Procardia [nifedipine] Other (See Comments)   Heart races   Sucralfate Nausea Only   Tramadol Other (See Comments)   Confusion   Allegra [fexofenadine] Rash   Naltrexone Other (See Comments)   Severe  headache, nausea, body flashes.   Sulfa Antibiotics Rash        Medication List     STOP taking these medications    spironolactone  50 MG tablet Commonly known as: ALDACTONE        TAKE these medications    acetaminophen  500 MG tablet Commonly known as: TYLENOL  Take 1 tablet (500 mg total) by mouth every 6 (six) hours as needed.   albuterol  108 (90 Base) MCG/ACT inhaler Commonly known as: VENTOLIN  HFA Inhale 2 puffs into the lungs every 4 (four) hours as needed for wheezing.   alum & mag hydroxide-simeth 200-200-20 MG/5ML suspension Commonly known as: MAALOX/MYLANTA Take 30 mLs by mouth every 4 (four) hours as needed for indigestion or heartburn.   ascorbic acid  500 MG tablet Commonly known as: VITAMIN C  Take 1 tablet (500 mg total) by mouth daily.   B-12 2500 MCG Tabs Take 2,500 mcg by mouth daily.   cholecalciferol  25 MCG (1000 UNIT) tablet Commonly known as: VITAMIN D3 Take 1,000 Units by mouth daily.   diltiazem  120 MG 24 hr capsule Commonly known as: CARDIZEM  CD Take 1 capsule (120 mg total) by mouth daily.   fenofibrate  160 MG tablet Take 160 mg by mouth daily.   gabapentin  100 MG capsule Commonly known as: NEURONTIN  Take 1 capsule by mouth 3 (three) times daily.   hydrOXYzine  25 MG tablet Commonly known as: ATARAX  Take 25 mg by mouth daily as needed for itching.   insulin  regular 100 units/mL injection Commonly known as: NOVOLIN  R Inject 1-10 Units into the skin daily. lunchtime   Iron  325 (65 Fe) MG Tabs Take 1 tablet (325 mg total) by mouth daily.   lisinopril  5 MG tablet Commonly known as: ZESTRIL  Take 1 tablet (5 mg total) by mouth daily. Hold this medication until you see nephro   loratadine  10 MG tablet Commonly known as: CLARITIN  Take 10 mg by mouth daily.   multivitamin with minerals Tabs tablet Take 1 tablet by mouth at bedtime.   nitroGLYCERIN  0.4 MG SL tablet Commonly known as: NITROSTAT  Place 0.4 mg under the tongue  every 5 (five) minutes as needed for chest pain.   NovoLIN  70/30 Kwikpen (70-30) 100 UNIT/ML KwikPen Generic drug: insulin  isophane & regular human KwikPen Inject 20-25 Units into the skin daily with breakfast.   ondansetron  4 MG tablet Commonly known as: ZOFRAN  Take 4 mg by mouth every 8 (eight) hours as needed for vomiting or nausea.   pantoprazole  40 MG tablet Commonly known as: PROTONIX  Take 40 mg by mouth 2 (two) times daily.   polyethylene glycol powder 17 GM/SCOOP powder Commonly known as: GLYCOLAX /MIRALAX  Take 17 g by mouth 2 (two) times daily. What changed:  when to take this reasons to take this   propranolol  ER 60 MG 24 hr capsule Commonly known as: Inderal  LA Take 1 capsule (60 mg total) by mouth daily.   senna 8.6 MG tablet Commonly known as: SENOKOT Take 1 tablet by mouth daily as needed.   sertraline  100 MG tablet Commonly known as: ZOLOFT  Take 100 mg by mouth daily.   torsemide  100 MG tablet Commonly known as: DEMADEX   Take 100 mg by mouth daily.        Discharge Exam: Filed Weights   01/18/24 1754  Weight: 63.5 kg    GENERAL:  83 y.o.-year-old patient lying in the bed with no acute distress.  EYES: Pupils equal, round, reactive to light and accommodation. No scleral icterus. Extraocular muscles intact.  HEENT: Head atraumatic, normocephalic. Oropharynx and nasopharynx clear.  NECK:  Supple, no jugular venous distention. No thyroid  enlargement, no tenderness.  LUNGS: Normal breath sounds bilaterally, no wheezing, rales,rhonchi or crepitation. No use of accessory muscles of respiration.  CARDIOVASCULAR: Regular rate and rhythm, S1, S2 normal. No murmurs, rubs, or gallops.  ABDOMEN: Soft and nontender no masses palpable EXTREMITIES: No pedal edema, cyanosis, or clubbing.  NEUROLOGIC: Cranial nerves II through XII are intact. Muscle strength 5/5 in all extremities. Sensation intact. Gait not checked.  PSYCHIATRIC: The patient is alert and oriented x  3.  Normal affect and good eye contact.    Condition at discharge: good  The results of significant diagnostics from this hospitalization (including imaging, microbiology, ancillary and laboratory) are listed below for reference.   Imaging Studies: CT ABDOMEN PELVIS W CONTRAST Result Date: 01/18/2024 CLINICAL DATA:  Abdominal pain, anemia, weakness EXAM: CT ABDOMEN AND PELVIS WITH CONTRAST TECHNIQUE: Multidetector CT imaging of the abdomen and pelvis was performed using the standard protocol following bolus administration of intravenous contrast. RADIATION DOSE REDUCTION: This exam was performed according to the departmental dose-optimization program which includes automated exposure control, adjustment of the mA and/or kV according to patient size and/or use of iterative reconstruction technique. CONTRAST:  OMNIPAQUE  IOHEXOL  300 MG/ML  SOLN COMPARISON:  10/29/2022 FINDINGS: Lower chest: Stable subpleural 5 mm right middle lobe pulmonary nodule image 2/2 is unchanged, benign given long-term stability. No specific follow-up recommended. No acute pleural or parenchymal lung disease. Hepatobiliary: No focal liver abnormality is seen. Status post cholecystectomy. No biliary dilatation. Pancreas: Unremarkable. No pancreatic ductal dilatation or surrounding inflammatory changes. Spleen: Normal in size without focal abnormality. Adrenals/Urinary Tract: Stable 2 cm left adrenal nodule containing central coarse calcifications, consistent with benign adenoma. Right adrenal is unremarkable. Kidneys enhance normally and symmetrically. No urinary tract calculi or obstructive uropathy. The bladder is unremarkable. Stomach/Bowel: No bowel obstruction or ileus. Normal appendix right lower quadrant. There is short segment wall thickening of the ascending colon, which may reflect inflammatory or infectious colitis. Moderate stool within the rectal vault. Vascular/Lymphatic: Aortic atherosclerosis. Multiple subcentimeter  retroperitoneal lymph nodes. No pathologic adenopathy. Reproductive: Status post hysterectomy. No adnexal masses. Other: Trace upper abdominal ascites. No free intraperitoneal gas. Stable fat containing umbilical hernia. No bowel herniation. Musculoskeletal: No acute or destructive bony abnormalities. Chronic T11 and L3 compression fractures. Reconstructed images demonstrate no additional findings. IMPRESSION: 1. Segmental wall thickening of the ascending colon, which may reflect inflammatory or infectious colitis. 2. Trace ascites. 3. Fat containing umbilical hernia.  No bowel herniation. 4. Moderate stool in the rectal vault, which could reflect fecal impaction. 5.  Aortic Atherosclerosis (ICD10-I70.0). Electronically Signed   By: Ozell Daring M.D.   On: 01/18/2024 21:01   US  ABDOMEN LIMITED RUQ (LIVER/GB) Result Date: 01/18/2024 CLINICAL DATA:  Upper abdominal pain, cholecystectomy EXAM: ULTRASOUND ABDOMEN LIMITED RIGHT UPPER QUADRANT COMPARISON:  10/29/2022 FINDINGS: Gallbladder: Surgically absent Common bile duct: Diameter: 4 mm Liver: Mild increased liver echotexture compatible with hepatic steatosis. No focal liver abnormality or intrahepatic biliary duct dilation. Portal vein is patent on color Doppler imaging with normal direction of blood flow towards the  liver. Other: Trace free fluid within the right upper quadrant, nonspecific. IMPRESSION: 1. Trace free fluid in the right upper quadrant, nonspecific. 2. Increased liver echotexture consistent with hepatic steatosis. 3. Prior cholecystectomy. Electronically Signed   By: Ozell Daring M.D.   On: 01/18/2024 20:03    Microbiology: Results for orders placed or performed during the hospital encounter of 01/18/24  Resp panel by RT-PCR (RSV, Flu A&B, Covid) Anterior Nasal Swab     Status: None   Collection Time: 01/18/24  6:58 PM   Specimen: Anterior Nasal Swab  Result Value Ref Range Status   SARS Coronavirus 2 by RT PCR NEGATIVE NEGATIVE Final     Comment: (NOTE) SARS-CoV-2 target nucleic acids are NOT DETECTED.  The SARS-CoV-2 RNA is generally detectable in upper respiratory specimens during the acute phase of infection. The lowest concentration of SARS-CoV-2 viral copies this assay can detect is 138 copies/mL. A negative result does not preclude SARS-Cov-2 infection and should not be used as the sole basis for treatment or other patient management decisions. A negative result may occur with  improper specimen collection/handling, submission of specimen other than nasopharyngeal swab, presence of viral mutation(s) within the areas targeted by this assay, and inadequate number of viral copies(<138 copies/mL). A negative result must be combined with clinical observations, patient history, and epidemiological information. The expected result is Negative.  Fact Sheet for Patients:  BloggerCourse.com  Fact Sheet for Healthcare Providers:  SeriousBroker.it  This test is no t yet approved or cleared by the [place]  FDA and  has been authorized for detection and/or diagnosis of SARS-CoV-2 by FDA under an Emergency Use Authorization (EUA). This EUA will remain  in effect (meaning this test can be used) for the duration of the COVID-19 declaration under Section 564(b)(1) of the Act, 21 U.S.C.section 360bbb-3(b)(1), unless the authorization is terminated  or revoked sooner.       Influenza A by PCR NEGATIVE NEGATIVE Final   Influenza B by PCR NEGATIVE NEGATIVE Final    Comment: (NOTE) The Xpert Xpress SARS-CoV-2/FLU/RSV plus assay is intended as an aid in the diagnosis of influenza from Nasopharyngeal swab specimens and should not be used as a sole basis for treatment. Nasal washings and aspirates are unacceptable for Xpert Xpress SARS-CoV-2/FLU/RSV testing.  Fact Sheet for Patients: BloggerCourse.com  Fact Sheet for Healthcare  Providers: SeriousBroker.it  This test is not yet approved or cleared by the [place]  FDA and has been authorized for detection and/or diagnosis of SARS-CoV-2 by FDA under an Emergency Use Authorization (EUA). This EUA will remain in effect (meaning this test can be used) for the duration of the COVID-19 declaration under Section 564(b)(1) of the Act, 21 U.S.C. section 360bbb-3(b)(1), unless the authorization is terminated or revoked.     Resp Syncytial Virus by PCR NEGATIVE NEGATIVE Final    Comment: (NOTE) Fact Sheet for Patients: BloggerCourse.com  Fact Sheet for Healthcare Providers: SeriousBroker.it  This test is not yet approved or cleared by the [place]  FDA and has been authorized for detection and/or diagnosis of SARS-CoV-2 by FDA under an Emergency Use Authorization (EUA). This EUA will remain in effect (meaning this test can be used) for the duration of the COVID-19 declaration under Section 564(b)(1) of the Act, 21 U.S.C. section 360bbb-3(b)(1), unless the authorization is terminated or revoked.  Performed at [facility], [address] [place], [place] [address]   Gastrointestinal Panel by PCR , Stool     Status: None   Collection Time:  01/24/24  8:10 AM   Specimen: Stool  Result Value Ref Range Status   Campylobacter species NOT DETECTED NOT DETECTED Final   Plesimonas shigelloides NOT DETECTED NOT DETECTED Final   Salmonella species NOT DETECTED NOT DETECTED Final   Yersinia enterocolitica NOT DETECTED NOT DETECTED Final   Vibrio species NOT DETECTED NOT DETECTED Final   Vibrio cholerae NOT DETECTED NOT DETECTED Final   Enteroaggregative E coli (EAEC) NOT DETECTED NOT DETECTED Final   Enteropathogenic E coli (EPEC) NOT DETECTED NOT DETECTED Final   Enterotoxigenic E coli (ETEC) NOT DETECTED NOT DETECTED Final   Shiga like toxin producing E coli (STEC) NOT DETECTED  NOT DETECTED Final   Shigella/Enteroinvasive E coli (EIEC) NOT DETECTED NOT DETECTED Final   Cryptosporidium NOT DETECTED NOT DETECTED Final   Cyclospora cayetanensis NOT DETECTED NOT DETECTED Final   Entamoeba histolytica NOT DETECTED NOT DETECTED Final   Giardia lamblia NOT DETECTED NOT DETECTED Final   Adenovirus F40/41 NOT DETECTED NOT DETECTED Final   Astrovirus NOT DETECTED NOT DETECTED Final   Norovirus GI/GII NOT DETECTED NOT DETECTED Final   Rotavirus A NOT DETECTED NOT DETECTED Final   Sapovirus (I, II, IV, and V) NOT DETECTED NOT DETECTED Final    Comment: Performed at [facility], [address] [place], [place] [address]    Labs: CBC: Recent Labs  Lab 01/18/24 1756 01/19/24 0630 01/20/24 0427 01/21/24 0520 01/22/24 0703  WBC 5.1 4.4 5.4 4.5 4.8  NEUTROABS  --   --  3.8 2.8 3.2  HGB 7.2* 7.6* 8.4* 8.0* 8.1*  HCT 25.3* 25.3* 28.2* 27.1* 26.6*  MCV 69.1* 73.3* 72.9* 72.5* 71.1*  PLT 209 160 176 163 143*   Basic Metabolic Panel: Recent Labs  Lab 01/19/24 0630 01/20/24 0427 01/21/24 0520 01/22/24 0801 01/23/24 0448  NA 135 135 138 135 131*  K 3.6 3.7 4.3 4.4 3.9  CL 100 100 100 91* 92*  CO2 27 26 30 26 27   GLUCOSE 80 200* 156* 290* 244*  BUN 12 12 15 19 21   CREATININE 0.95 1.08* 1.05* 1.37* 1.30*  CALCIUM  8.5* 8.3* 8.3* 8.7* 8.2*   Liver Function Tests: Recent Labs  Lab 01/18/24 1756  AST 25  ALT 12  ALKPHOS 120  BILITOT 1.4*  PROT 6.9  ALBUMIN 3.3*   CBG: Recent Labs  Lab 01/23/24 1625 01/23/24 2055 01/24/24 0807 01/24/24 0902 01/24/24 1222  GLUCAP 386* 325* 341* 313* 275*    Discharge time spent:  34 minutes.  Signed: Drue ONEIDA Potter, MD [facility] 01/24/2024

## 2024-01-24 NOTE — Progress Notes (Addendum)
   01/24/24 1100  Mobility  Activity Ambulated with assistance;Stood at bedside  Level of Assistance Contact guard assist, steadying assist  Assistive Device Front wheel walker  Distance Ambulated (ft) 100 ft  Range of Motion/Exercises Active Assistive  Activity Response Tolerated fair  Mobility Referral Yes  Mobility visit 1 Mobility  Mobility Specialist Start Time (ACUTE ONLY) 1025  Mobility Specialist Stop Time (ACUTE ONLY) 1050  Mobility Specialist Time Calculation (min) (ACUTE ONLY) 25 min   Mobility Specialist - Progress Note Pt was in the bed upon arrival. Pt agreed to mobility. Pt did state that she had an upset stomach. Pt was able to get to the EOB independently. Pt was also able to move from STS position and hold for 3 min. Pt did describe a bit of vertigo. Pt was ok to ambulate with 2 WW to the door and back to the recliner twice. Pt was also able to extend legs and ankle at the EOB. Pt returned to the bed after activity with bed alarm on and needs were in reach.  Clem Rodes Mobility Specialist 01/24/24, 11:57 AM

## 2024-01-24 NOTE — TOC Transition Note (Signed)
 Transition of Care [facility]) - Discharge Note   Patient Details  Name: Rhonda Tyler MRN: [id number] Date of Birth: January 26, 1941  [facility] CM/SW Contact:  Corean ONEIDA Haddock, RN Phone Number: 01/24/2024, 10:46 AM   Clinical Narrative:     Patient to discharge today Therapy recommending Home health. Patient in agreement and states she does not have a preference of agency.   Referral made and accepted by [facility] with [facility], per patient ping patient was last active with them in 2024    Barriers to Discharge: Continued Medical Work up   Patient Goals and CMS Choice            Discharge Placement                       Discharge Plan and Services Additional resources added to the After Visit Summary for   In-house Referral: Clinical Social Work Discharge Planning Services: CM Consult                                 Social Drivers of Health (SDOH) Interventions SDOH Screenings   Food Insecurity: No Food Insecurity (09/13/2022)  Housing: Unknown (10/07/2023)   Received from [facility]  Transportation Needs: No Transportation Needs (09/13/2022)  Utilities: Not At Risk (09/13/2022)  Social Connections: Unknown (09/21/2021)   Received from [facility]  Tobacco Use: Medium Risk (01/18/2024)     Readmission Risk Interventions    09/15/2022   10:21 AM  Readmission Risk Prevention Plan  Transportation Screening Complete  PCP or Specialist Appt within 3-5 Days Complete  HRI or Home Care Consult Complete  Social Work Consult for Recovery Care Planning/Counseling Complete  Palliative Care Screening Complete  Medication Review Oceanographer) Complete

## 2024-02-24 ENCOUNTER — Other Ambulatory Visit: Payer: Self-pay

## 2024-02-24 ENCOUNTER — Emergency Department
Admission: EM | Admit: 2024-02-24 | Discharge: 2024-02-25 | Disposition: A | Discharge status: Home / Self Care | Attending: Emergency Medicine | Admitting: Emergency Medicine

## 2024-02-24 DIAGNOSIS — R945 Abnormal results of liver function studies: Secondary | ICD-10-CM | POA: Insufficient documentation

## 2024-02-24 DIAGNOSIS — N189 Chronic kidney disease, unspecified: Secondary | ICD-10-CM | POA: Diagnosis not present

## 2024-02-24 DIAGNOSIS — I129 Hypertensive chronic kidney disease with stage 1 through stage 4 chronic kidney disease, or unspecified chronic kidney disease: Secondary | ICD-10-CM | POA: Insufficient documentation

## 2024-02-24 DIAGNOSIS — E119 Type 2 diabetes mellitus without complications: Secondary | ICD-10-CM | POA: Insufficient documentation

## 2024-02-24 DIAGNOSIS — I482 Chronic atrial fibrillation, unspecified: Secondary | ICD-10-CM | POA: Diagnosis not present

## 2024-02-24 DIAGNOSIS — I2510 Atherosclerotic heart disease of native coronary artery without angina pectoris: Secondary | ICD-10-CM | POA: Diagnosis not present

## 2024-02-24 DIAGNOSIS — D649 Anemia, unspecified: Secondary | ICD-10-CM | POA: Insufficient documentation

## 2024-02-24 LAB — COMPREHENSIVE METABOLIC PANEL WITH GFR
ALT: 11 U/L (ref 0–44)
AST: 29 U/L (ref 15–41)
Albumin: 3 g/dL — ABNORMAL LOW (ref 3.5–5.0)
Alkaline Phosphatase: 99 U/L (ref 38–126)
Anion gap: 14 (ref 5–15)
BUN: 21 mg/dL (ref 8–23)
CO2: 23 mmol/L (ref 22–32)
Calcium: 8.5 mg/dL — ABNORMAL LOW (ref 8.9–10.3)
Chloride: 89 mmol/L — ABNORMAL LOW (ref 98–111)
Creatinine, Ser: 1.26 mg/dL — ABNORMAL HIGH (ref 0.44–1.00)
GFR, Estimated: 43 mL/min — ABNORMAL LOW (ref >60)
Glucose, Bld: 419 mg/dL — ABNORMAL HIGH (ref 70–99)
Potassium: 3.5 mmol/L (ref 3.5–5.1)
Sodium: 126 mmol/L — ABNORMAL LOW (ref 135–145)
Total Bilirubin: 2.5 mg/dL — ABNORMAL HIGH (ref 0.0–1.2)
Total Protein: 6.8 g/dL (ref 6.5–8.1)

## 2024-02-24 LAB — URINALYSIS, ROUTINE W REFLEX MICROSCOPIC
Bacteria, UA: NONE SEEN
Bilirubin Urine: NEGATIVE
Glucose, UA: >=500 mg/dL — AB
Hgb urine dipstick: NEGATIVE
Ketones, ur: NEGATIVE mg/dL
Leukocytes,Ua: NEGATIVE
Nitrite: NEGATIVE
Protein, ur: NEGATIVE mg/dL
Specific Gravity, Urine: 1.003 — ABNORMAL LOW (ref 1.005–1.030)
pH: 5 (ref 5.0–8.0)

## 2024-02-24 LAB — CBC
HCT: 25.9 % — ABNORMAL LOW (ref 36.0–46.0)
Hemoglobin: 7.7 g/dL — ABNORMAL LOW (ref 12.0–15.0)
MCH: 21.2 pg — ABNORMAL LOW (ref 26.0–34.0)
MCHC: 29.7 g/dL — ABNORMAL LOW (ref 30.0–36.0)
MCV: 71.2 fL — ABNORMAL LOW (ref 80.0–100.0)
Platelets: 145 K/uL — ABNORMAL LOW (ref 150–400)
RBC: 3.64 MIL/uL — ABNORMAL LOW (ref 3.87–5.11)
RDW: 21.9 % — ABNORMAL HIGH (ref 11.5–15.5)
WBC: 4.4 K/uL (ref 4.0–10.5)
nRBC: 0 % (ref 0.0–0.2)

## 2024-02-24 MED ORDER — SODIUM CHLORIDE 0.9 % IV SOLN
10.0000 mL/h | Freq: Once | INTRAVENOUS | Status: AC
  Administered 2024-02-25: 10 mL/h via INTRAVENOUS

## 2024-02-24 MED ORDER — ONDANSETRON HCL 4 MG/2ML IJ SOLN
4.0000 mg | Freq: Once | INTRAMUSCULAR | Status: AC
  Administered 2024-02-24: 4 mg via INTRAVENOUS
  Filled 2024-02-24: qty 2

## 2024-02-24 MED ORDER — SODIUM CHLORIDE 0.9 % IV BOLUS
1000.0000 mL | Freq: Once | INTRAVENOUS | Status: AC
  Administered 2024-02-24: 1000 mL via INTRAVENOUS

## 2024-02-24 NOTE — Progress Notes (Signed)
 Discharge Summaries - documented in this encounter  Dorinda Drue DASEN, MD - 01/24/2024 2:17 PM EDT Formatting of this note is different from the original. Images from the original note were not included.  Physician Discharge Summary  Patient: LUBERTA GRABINSKI MRN: [id number] DOB: [birth year] Admit date: 01/18/2024 Discharge date: 01/24/24 Discharge Physician: Drue DASEN Dorinda  PCP: Lenon Layman ORN, MD  Recommendations at discharge: Follow-up with PCP  Discharge Diagnoses: Principal Problem: Acute colitis Active Problems: Sepsis due to undetermined organism (HCC) Iron  deficiency anemia Uncontrolled type 2 diabetes mellitus with hyperglycemia, with long-term current use of insulin  (HCC) Essential hypertension Dyslipidemia Depression Peripheral neuropathy GERD without esophagitis  Resolved Problems: * No resolved hospital problems. [facility] Course:  From HPI ADDILYNE BACKS is a 83 y.o. female with medical history significant for osteoarthritis, liver cirrhosis, coronary artery disease, coronary artery disease, OSA, type 2 diabetes mellitus with peripheral neuropathy, hypertension and dyslipidemia, who presented to the emergency room with acute onset of nausea with associated pain across her abdomen without bowel. She denied any fever or chills. She did not have a bowel movement during the day. She denied any diarrhea or melena or bright red blood per rectum or constipation. She has not been feeling well since earlier in the morning. No chest pain or palpitations. No cough or wheezing or dyspnea. No dysuria, oliguria or hematuria or flank pain.  ED Course: Upon presentation to the ER, BP was 143/66 with heart rate of 119 and respiratory rate 26 with otherwise normal vital signs. Labs revealed hyponatremia 130 and hypochloremia 95 and blood glucose of 419 with creatinine 1.12 and albumin 3.3. Total bili was 1.4. CBC showed anemia with hemoglobin 7.2 hematocrit 25.3 down from 13/37.7 on  06/04/2023. Stool Hemoccult came back negative. The patient had low RBC indices. UA showed more than 500 glucose and was otherwise negative. EKG as reviewed by me : EKG showed atrial fibrillation with rapid ventricular response of 130 with left axis deviation. Imaging: 2 view chest x-ray showed no acute cardiopulmonary disease.   Other hospital course as noted below:  Assessment and Plan: Acute colitis with acute diarrhea - The patient will be admitted to a medical telemetry bed. Has completed a course of ceftriaxone  and Flagyl  Received IV hydration GI panel was negative Abdominal pain resolved   Sepsis due to undetermined organism [facility]) - This is likely secondary to #1. - Management as above. Patient received IV fluid  Uncontrolled type 2 diabetes mellitus with hyperglycemia, with long-term current use of insulin  (HCC) - The patient will be placed on supplemental coverage with NovoLog . Continue oral hydration as well as glucose monitoring  Iron  deficiency anemia - Stool Hemoccult was negative X1. Repeat occult blood in stools.   Dyslipidemia - Will continue antihyperlipidemics.  Essential hypertension - Will continue antihypertensive therapy.  GERD without esophagitis - We will continue PPI therapy.  Peripheral neuropathy - Will continue Neurontin .  Depression - Will continue Zoloft .  Consultants: None Procedures performed: None Disposition: Home health Diet recommendation: Cardiac and Carb modified diet DISCHARGE MEDICATION: Allergies as of 01/24/2024  Reactions Levaquin [levofloxacin In D5w] Shortness Of Breath Penicillins Hives Codeine Other (See Comments) Hallucinate Lipitor [atorvastatin Calcium ] Other (See Comments) Muscle pain Lopressor  [metoprolol  Tartrate] Other (See Comments) Heart races Potassium-containing Compounds Other (See Comments) Facial flushing Procardia [nifedipine] Other (See Comments) Heart races Sucralfate Nausea Only Tramadol  Other (See Comments) Confusion Allegra [fexofenadine] Rash Naltrexone Other (See Comments) Severe headache, nausea, body flashes. Sulfa Antibiotics Rash  Medication List   STOP taking these medications  spironolactone  50 MG tablet Commonly known as: ALDACTONE      TAKE these medications  acetaminophen  500 MG tablet Commonly known as: TYLENOL  Take 1 tablet (500 mg total) by mouth every 6 (six) hours as needed.  albuterol  108 (90 Base) MCG/ACT inhaler Commonly known as: VENTOLIN  HFA Inhale 2 puffs into the lungs every 4 (four) hours as needed for wheezing.  alum & mag hydroxide-simeth 200-200-20 MG/5ML suspension Commonly known as: MAALOX/MYLANTA Take 30 mLs by mouth every 4 (four) hours as needed for indigestion or heartburn.  ascorbic acid  500 MG tablet Commonly known as: VITAMIN C  Take 1 tablet (500 mg total) by mouth daily.  B-12 2500 MCG Tabs Take 2,500 mcg by mouth daily.  cholecalciferol  25 MCG (1000 UNIT) tablet Commonly known as: VITAMIN D3 Take 1,000 Units by mouth daily.  diltiazem  120 MG 24 hr capsule Commonly known as: CARDIZEM  CD Take 1 capsule (120 mg total) by mouth daily.  fenofibrate  160 MG tablet Take 160 mg by mouth daily.  gabapentin  100 MG capsule Commonly known as: NEURONTIN  Take 1 capsule by mouth 3 (three) times daily.  hydrOXYzine  25 MG tablet Commonly known as: ATARAX  Take 25 mg by mouth daily as needed for itching.  insulin  regular 100 units/mL injection Commonly known as: NOVOLIN  R Inject 1-10 Units into the skin daily. lunchtime  Iron  325 (65 Fe) MG Tabs Take 1 tablet (325 mg total) by mouth daily.  lisinopril  5 MG tablet Commonly known as: ZESTRIL  Take 1 tablet (5 mg total) by mouth daily. Hold this medication until you see nephro  loratadine  10 MG tablet Commonly known as: CLARITIN  Take 10 mg by mouth daily.  multivitamin with minerals Tabs tablet Take 1 tablet by mouth at bedtime.  nitroGLYCERIN  0.4 MG  SL tablet Commonly known as: NITROSTAT  Place 0.4 mg under the tongue every 5 (five) minutes as needed for chest pain.  NovoLIN  70/30 Kwikpen (70-30) 100 UNIT/ML KwikPen Generic drug: insulin  isophane & regular human KwikPen Inject 20-25 Units into the skin daily with breakfast.  ondansetron  4 MG tablet Commonly known as: ZOFRAN  Take 4 mg by mouth every 8 (eight) hours as needed for vomiting or nausea.  pantoprazole  40 MG tablet Commonly known as: PROTONIX  Take 40 mg by mouth 2 (two) times daily.  polyethylene glycol powder 17 GM/SCOOP powder Commonly known as: GLYCOLAX /MIRALAX  Take 17 g by mouth 2 (two) times daily. What changed: when to take this reasons to take this  propranolol  ER 60 MG 24 hr capsule Commonly known as: Inderal  LA Take 1 capsule (60 mg total) by mouth daily.  senna 8.6 MG tablet Commonly known as: SENOKOT Take 1 tablet by mouth daily as needed.  sertraline  100 MG tablet Commonly known as: ZOLOFT  Take 100 mg by mouth daily.  torsemide  100 MG tablet Commonly known as: DEMADEX  Take 100 mg by mouth daily.     Discharge Exam: Filed Weights 01/18/24 1754 Weight: 63.5 kg  GENERAL: 83 y.o.-year-old patient lying in the bed with no acute distress. EYES: Pupils equal, round, reactive to light and accommodation. No scleral icterus. Extraocular muscles intact. HEENT: Head atraumatic, normocephalic. Oropharynx and nasopharynx clear. NECK: Supple, no jugular venous distention. No thyroid  enlargement, no tenderness. LUNGS: Normal breath sounds bilaterally, no wheezing, rales,rhonchi or crepitation. No use of accessory muscles of respiration. CARDIOVASCULAR: Regular rate and rhythm, S1, S2 normal. No murmurs, rubs, or gallops. ABDOMEN: Soft and nontender no masses palpable EXTREMITIES: No pedal edema, cyanosis,  or clubbing. NEUROLOGIC: Cranial nerves II through XII are intact. Muscle strength 5/5 in all extremities. Sensation intact. Gait not  checked. PSYCHIATRIC: The patient is alert and oriented x 3. Normal affect and good eye contact.   Condition at discharge: good  The results of significant diagnostics from this hospitalization (including imaging, microbiology, ancillary and laboratory) are listed below for reference.  Imaging Studies: CT ABDOMEN PELVIS W CONTRAST Result Date: 01/18/2024 CLINICAL DATA: Abdominal pain, anemia, weakness EXAM: CT ABDOMEN AND PELVIS WITH CONTRAST TECHNIQUE: Multidetector CT imaging of the abdomen and pelvis was performed using the standard protocol following bolus administration of intravenous contrast. RADIATION DOSE REDUCTION: This exam was performed according to the departmental dose-optimization program which includes automated exposure control, adjustment of the mA and/or kV according to patient size and/or use of iterative reconstruction technique. CONTRAST: OMNIPAQUE  IOHEXOL  300 MG/ML SOLN COMPARISON: 10/29/2022 FINDINGS: Lower chest: Stable subpleural 5 mm right middle lobe pulmonary nodule image 2/2 is unchanged, benign given long-term stability. No specific follow-up recommended. No acute pleural or parenchymal lung disease. Hepatobiliary: No focal liver abnormality is seen. Status post cholecystectomy. No biliary dilatation. Pancreas: Unremarkable. No pancreatic ductal dilatation or surrounding inflammatory changes. Spleen: Normal in size without focal abnormality. Adrenals/Urinary Tract: Stable 2 cm left adrenal nodule containing central coarse calcifications, consistent with benign adenoma. Right adrenal is unremarkable. Kidneys enhance normally and symmetrically. No urinary tract calculi or obstructive uropathy. The bladder is unremarkable. Stomach/Bowel: No bowel obstruction or ileus. Normal appendix right lower quadrant. There is short segment wall thickening of the ascending colon, which may reflect inflammatory or infectious colitis. Moderate stool within the rectal vault.  Vascular/Lymphatic: Aortic atherosclerosis. Multiple subcentimeter retroperitoneal lymph nodes. No pathologic adenopathy. Reproductive: Status post hysterectomy. No adnexal masses. Other: Trace upper abdominal ascites. No free intraperitoneal gas. Stable fat containing umbilical hernia. No bowel herniation. Musculoskeletal: No acute or destructive bony abnormalities. Chronic T11 and L3 compression fractures. Reconstructed images demonstrate no additional findings. IMPRESSION: 1. Segmental wall thickening of the ascending colon, which may reflect inflammatory or infectious colitis. 2. Trace ascites. 3. Fat containing umbilical hernia. No bowel herniation. 4. Moderate stool in the rectal vault, which could reflect fecal impaction. 5. Aortic Atherosclerosis (ICD10-I70.0). Electronically Signed By: Ozell Daring M.D. On: 01/18/2024 21:01  US  ABDOMEN LIMITED RUQ (LIVER/GB) Result Date: 01/18/2024 CLINICAL DATA: Upper abdominal pain, cholecystectomy EXAM: ULTRASOUND ABDOMEN LIMITED RIGHT UPPER QUADRANT COMPARISON: 10/29/2022 FINDINGS: Gallbladder: Surgically absent Common bile duct: Diameter: 4 mm Liver: Mild increased liver echotexture compatible with hepatic steatosis. No focal liver abnormality or intrahepatic biliary duct dilation. Portal vein is patent on color Doppler imaging with normal direction of blood flow towards the liver. Other: Trace free fluid within the right upper quadrant, nonspecific. IMPRESSION: 1. Trace free fluid in the right upper quadrant, nonspecific. 2. Increased liver echotexture consistent with hepatic steatosis. 3. Prior cholecystectomy. Electronically Signed By: Ozell Daring M.D. On: 01/18/2024 20:03  Microbiology: Results for orders placed or performed during the hospital encounter of 01/18/24 Resp panel by RT-PCR (RSV, Flu A&B, Covid) Anterior Nasal Swab Status: None Collection Time: 01/18/24 6:58 PM Specimen: Anterior Nasal Swab Result Value Ref Range Status SARS Coronavirus 2  by RT PCR NEGATIVE NEGATIVE Final Comment: (NOTE) SARS-CoV-2 target nucleic acids are NOT DETECTED.  The SARS-CoV-2 RNA is generally detectable in upper respiratory specimens during the acute phase of infection. The lowest concentration of SARS-CoV-2 viral copies this assay can detect is 138 copies/mL. A negative result does not preclude SARS-Cov-2 infection and should  not be used as the sole basis for treatment or other patient management decisions. A negative result may occur with improper specimen collection/handling, submission of specimen other than nasopharyngeal swab, presence of viral mutation(s) within the areas targeted by this assay, and inadequate number of viral copies(<138 copies/mL). A negative result must be combined with clinical observations, patient history, and epidemiological information. The expected result is Negative.  Fact Sheet for Patients: BloggerCourse.com  Fact Sheet for Healthcare Providers: SeriousBroker.it  This test is no t yet approved or cleared by the [place]  FDA and has been authorized for detection and/or diagnosis of SARS-CoV-2 by FDA under an Emergency Use Authorization (EUA). This EUA will remain in effect (meaning this test can be used) for the duration of the COVID-19 declaration under Section 564(b)(1) of the Act, 21 U.S.C.section 360bbb-3(b)(1), unless the authorization is terminated or revoked sooner.   Influenza A by PCR NEGATIVE NEGATIVE Final Influenza B by PCR NEGATIVE NEGATIVE Final Comment: (NOTE) The Xpert Xpress SARS-CoV-2/FLU/RSV plus assay is intended as an aid in the diagnosis of influenza from Nasopharyngeal swab specimens and should not be used as a sole basis for treatment. Nasal washings and aspirates are unacceptable for Xpert Xpress SARS-CoV-2/FLU/RSV testing.  Fact Sheet for Patients: BloggerCourse.com  Fact Sheet for Healthcare  Providers: SeriousBroker.it  This test is not yet approved or cleared by the [place]  FDA and has been authorized for detection and/or diagnosis of SARS-CoV-2 by FDA under an Emergency Use Authorization (EUA). This EUA will remain in effect (meaning this test can be used) for the duration of the COVID-19 declaration under Section 564(b)(1) of the Act, 21 U.S.C. section 360bbb-3(b)(1), unless the authorization is terminated or revoked.   Resp Syncytial Virus by PCR NEGATIVE NEGATIVE Final Comment: (NOTE) Fact Sheet for Patients: BloggerCourse.com  Fact Sheet for Healthcare Providers: SeriousBroker.it  This test is not yet approved or cleared by the [place]  FDA and has been authorized for detection and/or diagnosis of SARS-CoV-2 by FDA under an Emergency Use Authorization (EUA). This EUA will remain in effect (meaning this test can be used) for the duration of the COVID-19 declaration under Section 564(b)(1) of the Act, 21 U.S.C. section 360bbb-3(b)(1), unless the authorization is terminated or revoked.  Performed at [facility], [address] [place], [place] [address]  Gastrointestinal Panel by PCR , Stool Status: None Collection Time: 01/24/24 8:10 AM Specimen: Stool Result Value Ref Range Status Campylobacter species NOT DETECTED NOT DETECTED Final Plesimonas shigelloides NOT DETECTED NOT DETECTED Final Salmonella species NOT DETECTED NOT DETECTED Final Yersinia enterocolitica NOT DETECTED NOT DETECTED Final Vibrio species NOT DETECTED NOT DETECTED Final Vibrio cholerae NOT DETECTED NOT DETECTED Final Enteroaggregative E coli (EAEC) NOT DETECTED NOT DETECTED Final Enteropathogenic E coli (EPEC) NOT DETECTED NOT DETECTED Final Enterotoxigenic E coli (ETEC) NOT DETECTED NOT DETECTED Final Shiga like toxin producing E coli (STEC) NOT DETECTED NOT DETECTED  Final Shigella/Enteroinvasive E coli (EIEC) NOT DETECTED NOT DETECTED Final Cryptosporidium NOT DETECTED NOT DETECTED Final Cyclospora cayetanensis NOT DETECTED NOT DETECTED Final Entamoeba histolytica NOT DETECTED NOT DETECTED Final Giardia lamblia NOT DETECTED NOT DETECTED Final Adenovirus F40/41 NOT DETECTED NOT DETECTED Final Astrovirus NOT DETECTED NOT DETECTED Final Norovirus GI/GII NOT DETECTED NOT DETECTED Final Rotavirus A NOT DETECTED NOT DETECTED Final Sapovirus (I, II, IV, and V) NOT DETECTED NOT DETECTED Final Comment: Performed at [facility], [address] [place], [place] [address]  Labs: CBC: Recent Labs Lab 01/18/24 1756 01/19/24 0630 01/20/24 0427 01/21/24 9479  01/22/24 0703 WBC 5.1 4.4 5.4 4.5 4.8 NEUTROABS -- -- 3.8 2.8 3.2 HGB 7.2* 7.6* 8.4* 8.0* 8.1* HCT 25.3* 25.3* 28.2* 27.1* 26.6* MCV 69.1* 73.3* 72.9* 72.5* 71.1* PLT 209 160 176 163 143*  Basic Metabolic Panel: Recent Labs Lab 01/19/24 0630 01/20/24 0427 01/21/24 0520 01/22/24 0801 01/23/24 0448 NA 135 135 138 135 131* K 3.6 3.7 4.3 4.4 3.9 CL 100 100 100 91* 92* CO2 27 26 30 26 27  GLUCOSE 80 200* 156* 290* 244* BUN 12 12 15 19 21  CREATININE 0.95 1.08* 1.05* 1.37* 1.30* CALCIUM  8.5* 8.3* 8.3* 8.7* 8.2*  Liver Function Tests: Recent Labs Lab 01/18/24 1756 AST 25 ALT 12 ALKPHOS 120 BILITOT 1.4* PROT 6.9 ALBUMIN 3.3*  CBG: Recent Labs Lab 01/23/24 1625 01/23/24 2055 01/24/24 0807 01/24/24 0902 01/24/24 1222 GLUCAP 386* 325* 341* 313* 275*  Discharge time spent: 34 minutes.  Signed: Drue ONEIDA Potter, MD [facility] 01/24/2024 Electronically signed by Potter Drue ONEIDA, MD at 01/24/2024 2:30 PM EDT   Iantha LULLA Clarity is a 83 y.o. female here for    CHIEF COMPLAINT:    Patient Active Problem List  Diagnosis  . Coronary atherosclerosis of autologous vein bypass graft  . Headache  . B12 deficiency   . Type 2 diabetes mellitus with stage 4 chronic  kidney disease (CMS/HHS-HCC)  . Hyperlipidemia associated with type 2 diabetes mellitus , unspecified (CMS-HCC)  . Health care maintenance  . Cutaneous sarcoidosis (CMS-HCC)  . OSA on CPAP  . Liver cirrhosis secondary to NASH (nonalcoholic steatohepatitis) (CMS/HHS-HCC)  . Idiopathic gout, unspecified site  . S/P revision of total knee  . SVT (supraventricular tachycardia) (HHS-HCC)  . Chronic diastolic CHF (congestive heart failure) (CMS/HHS-HCC)     HISTORY OF PRESENT ILLNESS:  SHWETA AMAN complains of continued abd pain and poor ability to eat, never really improved after above. Wt is down and fatigued, has a Comptroller at home often. Unclear if taking her insulin   Past Medical History:  Diagnosis Date  . Allergic state   . Anemia    iron  infusions beginning 07/08/2018  . Arthritis   . Asthma, unspecified asthma severity, unspecified whether complicated, unspecified whether persistent (HHS-HCC)   . Blockage of coronary artery of heart (CMS/HHS-HCC)   . Cervical disc disease   . Chest pain   . Cholecystolithiasis   . Chronic kidney disease   . Colon polyp   . Coronary artery disease   . Diabetes mellitus type 2, uncomplicated (CMS/HHS-HCC)   . Diabetic neuropathy (CMS/HHS-HCC)   . Fibroid   . Foot fracture   . GERD (gastroesophageal reflux disease)   . Heart attack (CMS/HHS-HCC)   . Heart failure, unspecified (CMS/HHS-HCC) 10/16/2020  . Heart murmur   . Hemorrhoids   . History of cataract   . Hyperlipidemia   . Hypertension   . IBS (irritable bowel syndrome)   . Reactive airway disease (HHS-HCC)   . Sleep apnea   . Urinary incontinence, mixed     Past Surgical History:  Procedure Laterality Date  . CORONARY ARTERY BYPASS GRAFT  1993  . HYSTERECTOMY SUPRACERVICAL ABDOMINAL W/REMOVAL TUBES &/OR OVARIES  1995  . CHOLECYSTECTOMY  1997  . Foot fracture repair  1999  . VESICULAR VAGINAL FISTULA  2002  . BACK SURGERY  2002   Neck Surgery- Dr Amon Morita  .  ENDOSCOPIC CARPAL TUNNEL RELEASE  2004  . COLONOSCOPY  03/15/2003  . EXCISION OF MASS  03/05/2004  . LEFT TOTAL KNEE REPLACEMENT  04/20/2006  . COLONOSCOPY  01/04/2008   To repeat 01/04/2008...AW, CMA**  . ARTHROSCOPIC ROTATOR CUFF REPAIR  07/18/2010  . COLONOSCOPY  06/01/2012   Within normal limits..Repeat 10 years/[place]..AW, CMA**  . EGD  06/01/2012   Multiple gastric polyps..No Repeat/[place]..AW, CMA**  . COLONOSCOPY  11/29/2017   Normal colon/Repeat 15yrs/MUS  . EGD  11/29/2017   Hyperplastic gastric polyps/Repeat 6 to 8 weeks/MUS  . EGD  03/07/2018   Gastric polyps/Repeat 15yr/MUS  . EGD  08/16/2019   Multiple gastric polyps/No Repeat/TKT  . Left total knee revision arthroplasty   05/07/2021   Dr Mardee  . BLADDER SURGERY  1996  . CARDIAC CATHETERIZATION    . CHOLECYSTECTOMY    . CORONARY ANGIOPLASTY    . DILATION AND CURETTAGE OF UTERUS  1968  . HYSTERECTOMY    . KNEE ARTHROSCOPY  2007  . OOPHORECTOMY    . PTCA and stent of the RCA       No fever chills or sweats   No nausea, vomiting or diarrhea  No chest pain, shortness of breath   Social History   Socioeconomic History  . Marital status: Widowed  . Number of children: 2  . Years of education: 7  . Highest education level: High school graduate  Occupational History  . Occupation: Retired  Tobacco Use  . Smoking status: Former    Current packs/day: 0.00    Types: Cigarettes    Quit date: 12/01/1970    Years since quitting: 53.2  . Smokeless tobacco: Never  Vaping Use  . Vaping status: Never Used  Substance and Sexual Activity  . Alcohol use: No  . Drug use: No  . Sexual activity: Never   Social Drivers of Health   Food Insecurity: No Food Insecurity (09/13/2022)   Received [facility]   Hunger Vital Sign   . Within the past 12 months, you worried that your food would run out before you got the money to buy more.: Never true   . Within the past 12 months, the food you bought just didn't last and you  didn't have money to get more.: Never true  Transportation Needs: No Transportation Needs (09/13/2022)   Received from [facility] - Transportation   . Lack of Transportation (Medical): No   . Lack of Transportation (Non-Medical): No   [facility]   Social Network  Housing Stability: Unknown (10/07/2023)   Housing Stability Vital Sign   . Homeless in the Last Year: No      Current Outpatient Medications:  .  acetaminophen  (TYLENOL ) 500 mg capsule, Take 1 capsule (500 mg total) by mouth every 4 (four) hours as needed for Fever, Disp: , Rfl:  .  albuterol  MDI, PROVENTIL , VENTOLIN , PROAIR , HFA 90 mcg/actuation inhaler, Inhale 2 inhalations into the lungs every 4 (four) hours as needed for Wheezing, Disp: 1 each, Rfl: 0 .  ascorbic acid , vitamin C , (VITAMIN C ) 500 MG tablet, Take 500 mg by mouth once daily, Disp: , Rfl:  .  blood glucose diagnostic (ONETOUCH ULTRA BLUE TEST STRIP) test strip, 3 (three) times daily Dx e11.65, Disp: 300 each, Rfl: 3 .  cholecalciferol  (VITAMIN D3) 1000 unit tablet, Take 1,000 Units by mouth once daily, Disp: , Rfl:  .  CYANOCOBALAMIN , VITAMIN B-12, (VITAMIN B-12 ORAL), Take 2,500 mcg by mouth once daily, Disp: , Rfl:  .  dilTIAZem  (CARDIZEM  CD) 120 MG XR capsule, Take 1 capsule by mouth once daily, Disp: 90 capsule,  Rfl: 3 .  fenofibrate  160 MG tablet, Take 1 tablet by mouth once daily, Disp: 60 tablet, Rfl: 11 .  flash glucose sensor (FREESTYLE LIBRE 2 SENSOR) Kit, Use 1 kit every 14 (fourteen) days, Disp: 9 each, Rfl: 3 .  fluticasone-umeclidinium-vilanterol (TRELEGY ELLIPTA) 100-62.5-25 mcg inhaler, Inhale 1 Puff into the lungs once daily, Disp: 1 each, Rfl: 5 .  FREESTYLE LIBRE 2 READER reader, Use to monitor blood glucose., Disp: 1 each, Rfl: 1 .  hydrOXYchloroQUINE  (PLAQUENIL ) 200 mg tablet, Take 1 tablet (200 mg total) by mouth once daily, Disp: , Rfl:  .  insulin  GLARGINE (LANTUS ) injection (concentration 100 units/mL), Inject 14  Units subcutaneously at bedtime, Disp: , Rfl:  .  insulin  NPH-REGULAR (NOVOLIN  70/30 U-100 INSULIN ) 100 unit/mL (70-30) injection, Inject 25 units subcutaneously in the morning and 20 units at night., Disp: 30 mL, Rfl: 3 .  lisinopriL  (ZESTRIL ) 5 MG tablet, Take 5 mg by mouth once daily, Disp: , Rfl:  .  loratadine  (CLARITIN ) 10 mg capsule, Take 1 capsule (10 mg total) by mouth once daily, Disp: , Rfl:  .  magnesium  oxide (MAG-OX) 400 mg (241.3 mg magnesium ) tablet, Take 1 tablet by mouth twice daily, Disp: 120 tablet, Rfl: 3 .  multivitamin tablet, Take 1 tablet by mouth once daily, Disp: , Rfl:  .  nitroGLYcerin  (NITROSTAT ) 0.4 MG SL tablet, DISSOLVE ONE TABLET UNDER THE TONGUE EVERY 5 MINUTES AS NEEDED FOR CHEST PAIN.  DO NOT EXCEED A TOTAL OF 3 DOSES IN 15 MINUTES, Disp: 25 tablet, Rfl: 0 .  pantoprazole  (PROTONIX ) 40 MG DR tablet, Take 1 tablet (40 mg total) by mouth 2 (two) times daily, Disp: 180 tablet, Rfl: 3 .  propranoloL  (INDERAL  LA) 60 MG LA capsule, Take 1 capsule by mouth once daily, Disp: 90 capsule, Rfl: 0 .  SANTYL ointment, Apply topically once daily, Disp: , Rfl:  .  scopolamine (TRANSDERM-SCOP) 1 mg over 3 days patch, Place 1 patch (1 mg total) onto the skin every third day for 180 days, Disp: 10 patch, Rfl: 5 .  sennosides (SENOKOT) 8.6 mg tablet, Take 1 tablet by mouth once daily, Disp: , Rfl:  .  spironolactone  (ALDACTONE ) 50 MG tablet, Take 1 tablet (50 mg total) by mouth once daily, Disp: 90 tablet, Rfl: 3 .  syringe with needle 1 mL 25 gauge x 1 syringe, Use as directed for up to 30 days, Disp: 100 each, Rfl: 0 .  TORsemide  (DEMADEX ) 100 MG tablet, Take 1 tablet (100 mg total) by mouth once daily, Disp: 90 tablet, Rfl: 3 .  triamcinolone 0.1 % cream, Apply 1 Application topically 2 (two) times daily as needed, Disp: , Rfl:   Vitals:   02/24/24 1257  BP: 121/78  Pulse: 98   Body mass index is 26.93 kg/m. No acute distress HEENT: Normocephalic atraumatic. TM's clear.  Op clear.  Lungs; clear to ascultation Heart; Regular rate and rhythm  Abdomen; Soft and flat, normal bowel sounds Extremities; No clubbing, cyanosis or edema  Office Visit on 01/18/2024  Component Date Value Ref Range Status  . Protein, Total 01/18/2024 6.2  6.1 - 7.9 g/dL Final  . Albumin 90/90/7974 3.6  3.5 - 4.8 g/dL Final  . Bilirubin, Total 01/18/2024 1.3 (H)  0.3 - 1.2 mg/dL Final  . Bilirubin, Conjugated 01/18/2024 0.33 (H)  0.00 - 0.20 mg/dL Final  . Alk Phos (alkaline Phosphatase) 01/18/2024 131 (H)  34 - 104 U/L Final  . AST  01/18/2024 9  8 -  39 U/L Final  . ALT  01/18/2024 9  5 - 38 U/L Final  . WBC (White Blood Cell Count) 01/18/2024 4.3  4.1 - 10.2 10^3/uL Final  . RBC (Red Blood Cell Count) 01/18/2024 3.59 (L)  4.04 - 5.48 10^6/uL Final  . Hemoglobin 01/18/2024 7.2 (L)  12.0 - 15.0 gm/dL Final  . Hematocrit 90/90/7974 24.4 (L)  35.0 - 47.0 % Final  . MCV (Mean Corpuscular Volume) 01/18/2024 68.0 (L)  80.0 - 100.0 fl Final  . MCH (Mean Corpuscular Hemoglobin) 01/18/2024 20.1 (L)  27.0 - 31.2 pg Final  . MCHC (Mean Corpuscular Hemoglobin * 01/18/2024 29.5 (L)  32.0 - 36.0 gm/dL Final  . Platelet Count 01/18/2024 214  150 - 450 10^3/uL Final  . RDW-CV (Red Cell Distribution Widt* 01/18/2024 15.4 (H)  11.6 - 14.8 % Final  . MPV (Mean Platelet Volume) 01/18/2024 11.1  9.4 - 12.4 fl Final  . Neutrophils 01/18/2024 3.29  1.50 - 7.80 10^3/uL Final  . Lymphocytes 01/18/2024 0.62 (L)  1.00 - 3.60 10^3/uL Final  . Monocytes 01/18/2024 0.29  0.00 - 1.50 10^3/uL Final  . Eosinophils 01/18/2024 0.06  0.00 - 0.55 10^3/uL Final  . Basophils 01/18/2024 0.01  0.00 - 0.09 10^3/uL Final  . Neutrophil % 01/18/2024 76.9 (H)  32.0 - 70.0 % Final  . Lymphocyte % 01/18/2024 14.5  10.0 - 50.0 % Final  . Monocyte % 01/18/2024 6.8  4.0 - 13.0 % Final  . Eosinophil % 01/18/2024 1.4  1.0 - 5.0 % Final  . Basophil% 01/18/2024 0.2  0.0 - 2.0 % Final  . Immature Granulocyte % 01/18/2024 0.2  <=0.7  % Final  . Immature Granulocyte Count 01/18/2024 0.01  <=0.06 10^3/L Final  . Glucose 01/18/2024 501 (HH)  70 - 110 mg/dL Final  . Sodium 90/90/7974 129 (L)  136 - 145 mmol/L Final  . Potassium 01/18/2024 4.2  3.6 - 5.1 mmol/L Final  . Chloride 01/18/2024 93 (L)  97 - 109 mmol/L Final  . Carbon Dioxide (CO2) 01/18/2024 26.3  22.0 - 32.0 mmol/L Final  . Calcium  01/18/2024 9.5  8.7 - 10.3 mg/dL Final  . Urea Nitrogen (BUN) 01/18/2024 10  7 - 25 mg/dL Final  . Creatinine 90/90/7974 0.9  0.6 - 1.1 mg/dL Final  . Glomerular Filtration Rate (eGFR) 01/18/2024 64  >60 mL/min/1.73sq m Final  . BUN/Crea Ratio 01/18/2024 11.1  6.0 - 20.0 Final  . Anion Gap w/K 01/18/2024 13.9  6.0 - 16.0 Final  . Prothrombin Time 01/18/2024 12.5  10.1 - 13.4 Sec Final  . Prothrombin INR 01/18/2024 1.1 (L)  2.0 - 3.0 Final  . Hemoglobin A1C 01/18/2024 14.6 (H)  4.2 - 5.6 % Final  . Average Blood Glucose (Calc) 01/18/2024 372  mg/dL Final    ASSESSMENT  AND PLAN:   Diagnoses and all orders for this visit:  Colitis, unspecified -     CBC w/auto Differential (5 Part) -     Hepatic Function Panel (HFP) -     Basic Metabolic Panel (BMP) -     Lipase -     CT abdomen pelvis with and without contrast; Future  Anemia, unspecified type -     CBC w/auto Differential (5 Part) -     Hepatic Function Panel (HFP) -     Basic Metabolic Panel (BMP) -     Lipase  Type 2 diabetes mellitus with hyperglycemia, with long-term current use of insulin  (CMS/HHS-HCC)   Still doing poorly post  [facility], may need placement and I discussed this with her, may need a transfusion etc also , fu 2 weeks, may need more tx pending above

## 2024-02-24 NOTE — ED Triage Notes (Signed)
 Arrived by [facility] from home. Abnormal liver enzymes.   History hypertension.  202/96 b/p 43-170s HR A-fib

## 2024-02-24 NOTE — ED Provider Notes (Signed)
 [facility] Provider Note   Event Date/Time   First MD Initiated Contact with Patient 02/24/24 2335     (approximate) History  Abnormal Labs  HPI ELIM PEALE is a 83 y.o. female with past medical history of persistent atrial fibrillation not on anticoagulation, CAD, NASH, CKD, type 2 diabetes, hyperlipidemia, and hypertension who presents after being called by her physician and told that she had abnormal liver enzymes and low hemoglobin.  Patient states that she has been feeling nauseated and weak over the past few days.  Patient denies any abdominal pain over these few days.  Patient endorses dyspnea on exertion.  Patient denies any hematochezia, melena, or hematemesis ROS: Patient currently denies any vision changes, tinnitus, difficulty speaking, facial droop, sore throat, chest pain, abdominal pain, vomiting/diarrhea, dysuria, or weakness/numbness/paresthesias in any extremity   Physical Exam  Triage Vital Signs: ED Triage Vitals  Encounter Vitals Group     BP 02/24/24 1817 (!) 144/73     Girls Systolic BP Percentile --      Girls Diastolic BP Percentile --      Boys Systolic BP Percentile --      Boys Diastolic BP Percentile --      Pulse Rate 02/24/24 1817 (!) 127     Resp 02/24/24 1817 20     Temp 02/24/24 1817 98.4 F (36.9 C)     Temp Source 02/24/24 1817 Oral     SpO2 02/24/24 1817 100 %     Weight 02/24/24 1816 150 lb (68 kg)     Height 02/24/24 1816 5' 3 (1.6 m)     Head Circumference --      Peak Flow --      Pain Score 02/24/24 1815 0     Pain Loc --      Pain Education --      Exclude from Growth Chart --    Most recent vital signs: Vitals:   02/25/24 0530 02/25/24 0534  BP: (!) 142/68 132/64  Pulse: (!) 113 (!) 108  Resp: 18   Temp:  97.7 F (36.5 C)  SpO2:  100%   General: Awake, oriented x4. CV:  Good peripheral perfusion. Resp:  Normal effort. Abd:  No distention. Other:  Elderly overweight Caucasian female resting  comfortably in no acute distress ED Results / Procedures / Treatments  Labs (all labs ordered are listed, but only abnormal results are displayed) Labs Reviewed  COMPREHENSIVE METABOLIC PANEL WITH GFR - Abnormal; Notable for the following components:      Result Value   Sodium 126 (*)    Chloride 89 (*)    Glucose, Bld 419 (*)    Creatinine, Ser 1.26 (*)    Calcium  8.5 (*)    Albumin 3.0 (*)    Total Bilirubin 2.5 (*)    GFR, Estimated 43 (*)    All other components within normal limits  CBC - Abnormal; Notable for the following components:   RBC 3.64 (*)    Hemoglobin 7.7 (*)    HCT 25.9 (*)    MCV 71.2 (*)    MCH 21.2 (*)    MCHC 29.7 (*)    RDW 21.9 (*)    Platelets 145 (*)    All other components within normal limits  URINALYSIS, ROUTINE W REFLEX MICROSCOPIC - Abnormal; Notable for the following components:   Color, Urine YELLOW (*)    APPearance CLEAR (*)    Specific Gravity, Urine 1.003 (*)  Glucose, UA >=500 (*)    All other components within normal limits  PREPARE RBC (CROSSMATCH)  TYPE AND SCREEN   EKG ED ECG REPORT I, Artist MARLA Kerns, the attending physician, personally viewed and interpreted this ECG. Date: 02/24/2024 EKG Time: 1818 Rate: 127 Rhythm: Atrial fibrillation with rapid ventricular response QRS Axis: normal Intervals: normal ST/T Wave abnormalities: normal Narrative Interpretation: Atrial fibrillation with rapid ventricular response no evidence of acute ischemia PROCEDURES: Critical Care performed: Yes, see critical care procedure note(s) Procedures CRITICAL CARE Performed by: Artist MARLA Kerns  Total critical care time: 33 minutes  Critical care time was exclusive of separately billable procedures and treating other patients.  Critical care was necessary to treat or prevent imminent or life-threatening deterioration.  Critical care was time spent personally by me on the following activities: development of treatment plan with patient  and/or surrogate as well as nursing, discussions with consultants, evaluation of patient's response to treatment, examination of patient, obtaining history from patient or surrogate, ordering and performing treatments and interventions, ordering and review of laboratory studies, ordering and review of radiographic studies, pulse oximetry and re-evaluation of patient's condition.  MEDICATIONS ORDERED IN ED: Medications  sodium chloride  0.9 % bolus 1,000 mL (0 mLs Intravenous Stopped 02/25/24 0047)  0.9 %  sodium chloride  infusion (0 mL/hr Intravenous Stopped 02/25/24 0203)  ondansetron  (ZOFRAN ) injection 4 mg (4 mg Intravenous Given 02/24/24 2351)  diltiazem  (CARDIZEM ) injection 10 mg (10 mg Intravenous Given 02/25/24 0111)  ondansetron  (ZOFRAN ) injection 4 mg (4 mg Intravenous Given 02/25/24 0203)   IMPRESSION / MDM / ASSESSMENT AND PLAN / ED COURSE  I reviewed the triage vital signs and the nursing notes.                             The patient is on the cardiac monitor to evaluate for evidence of arrhythmia and/or significant heart rate changes. Patient's presentation is most consistent with acute presentation with potential threat to life or bodily function. Patient is an 83 year old female with the above-stated past medical history who presents after being called by her physician with concerns for elevated liver enzymes as well as anemia. DDx: GI bleed, anemia of chronic disease, acute hepatitis, biliary disease Plan: CBC, CMP, UA, type and screen Plan for 1 unit transfusion given patient's hemoglobin is 7.7 history of cardiac disease Sodium 126 and will replete with IV normal saline Patient signs of atrial fibrillation with rapid ventricular response with rate around 120s.  Will plan to reassess after transfusion and fluids for resolution of this tachycardia.    Patient's heart rate greatly improved down to the 90s/100s.  Patient states she is feeling better and agrees with plan for  discharge home at this time with PCP follow-up as needed.  Patient given strict return precautions and all questions answered prior to discharge.  Dispo: Discharge home with PCP follow-up   FINAL CLINICAL IMPRESSION(S) / ED DIAGNOSES   Final diagnoses:  Symptomatic anemia  Chronic atrial fibrillation (HCC)   Rx / DC Orders   ED Discharge Orders     None      Note:  This document was prepared using Dragon voice recognition software and may include unintentional dictation errors.   Oronde Hallenbeck K, MD 02/25/24 [phone]

## 2024-02-24 NOTE — ED Triage Notes (Signed)
 Patient states she had outpatient labs, doctor called patient's son and told him to get patient to ED for elevated liver enzymes and low hemoglobin. Patient states she has been nauseated and feeling weak.

## 2024-02-25 ENCOUNTER — Other Ambulatory Visit: Payer: Self-pay | Admitting: Internal Medicine

## 2024-02-25 DIAGNOSIS — K529 Noninfective gastroenteritis and colitis, unspecified: Secondary | ICD-10-CM

## 2024-02-25 DIAGNOSIS — D649 Anemia, unspecified: Secondary | ICD-10-CM | POA: Diagnosis not present

## 2024-02-25 LAB — CBG MONITORING, ED: Glucose-Capillary: 303 mg/dL — ABNORMAL HIGH (ref 70–99)

## 2024-02-25 LAB — PREPARE RBC (CROSSMATCH)

## 2024-02-25 MED ORDER — DIPHENHYDRAMINE HCL 50 MG/ML IJ SOLN
25.0000 mg | Freq: Once | INTRAMUSCULAR | Status: AC
  Administered 2024-02-25: 25 mg via INTRAVENOUS
  Filled 2024-02-25: qty 1

## 2024-02-25 MED ORDER — DILTIAZEM HCL 25 MG/5ML IV SOLN
10.0000 mg | Freq: Once | INTRAVENOUS | Status: AC
  Administered 2024-02-25: 10 mg via INTRAVENOUS
  Filled 2024-02-25: qty 5

## 2024-02-25 MED ADMIN — Ondansetron HCl Inj 4 MG/2ML (2 MG/ML): 4 mg | INTRAVENOUS | NDC 60505613005

## 2024-02-25 MED FILL — Ondansetron HCl Inj 4 MG/2ML (2 MG/ML): 4.0000 mg | INTRAMUSCULAR | Qty: 2 | Status: AC

## 2024-02-25 NOTE — ED Notes (Signed)
 Pt soiled her bed. Pt's linen and brief changed.

## 2024-02-25 NOTE — TOC Transition Note (Signed)
 Transition of Care [facility]) - Discharge Note   Patient Details  Name: Rhonda Tyler MRN: [id number] Date of Birth: 10/18/1940  [facility] CM/SW Contact:  [place] L Aspyn Warnke, LCSW Phone Number: 02/25/2024, 9:09 AM   Clinical Narrative:      CSW spoke with patient son, Ozell, regarding discharge. He expressed concerns and inquired about home health. Based on the conversation, patient could benefit from PT, RN and SW. Patient was discharged before this conversation took place. CSW notified the attending and requested that HHA orders are entered.   Ozell advised that he has no preference. He is currently on a business trip in [place] and he won't return until Monday. He advised that he lives in [place]. Patient pays for a caregiver to assist her a few days out of the week. He stated that his mother is stubborn and she won't go to an ALF where she would be safe and get medication monitoring.   CSW contacted [facility] and spoke with Channing. [facility] has the availability to accept patients.        Patient Goals and CMS Choice            Discharge Placement                       Discharge Plan and Services Additional resources added to the After Visit Summary for                                       Social Drivers of Health (SDOH) Interventions SDOH Screenings   Food Insecurity: No Food Insecurity (09/13/2022)  Housing: Unknown (10/07/2023)   Received from [facility]  Transportation Needs: No Transportation Needs (09/13/2022)  Utilities: Not At Risk (09/13/2022)  Social Connections: Unknown (09/21/2021)   Received from [facility]  Tobacco Use: Medium Risk (02/24/2024)     Readmission Risk Interventions    09/15/2022   10:21 AM  Readmission Risk Prevention Plan  Transportation Screening Complete  PCP or Specialist Appt within 3-5 Days Complete  HRI or Home Care Consult Complete  Social Work Consult for Recovery Care  Planning/Counseling Complete  Palliative Care Screening Complete  Medication Review [employer]) Complete

## 2024-02-25 NOTE — ED Notes (Signed)
 Fall risk bundle is currently in place.

## 2024-02-25 NOTE — ED Notes (Signed)
 Spoke to pts caregiver, Cathryne Bertrand, ([phone]) updated on pts disposition. Caregiver Deanna stated that she will be around to get pt at 0900 this morning.

## 2024-02-25 NOTE — ED Notes (Signed)
 Pt given Arlyss crackers per pt request.

## 2024-02-25 NOTE — ED Notes (Signed)
 Answered call light, assisted RN Vernell with changing pt as her purewick was leaking, changed pt sheets, and placed chuck pad under pt.

## 2024-02-25 NOTE — ED Notes (Addendum)
 Answered call light, pt advised she is itching all of a sudden from the waist up. Notified, RN, Vernell. Also, updated VS

## 2024-02-26 LAB — BPAM RBC
Blood Product Expiration Date: 202511092359
ISSUE DATE / TIME: 202510170129
Unit Type and Rh: 6200

## 2024-02-26 LAB — TYPE AND SCREEN
ABO/RH(D): A POS
Antibody Screen: NEGATIVE
Unit division: 0

## 2024-03-07 ENCOUNTER — Other Ambulatory Visit
Admission: RE | Admit: 2024-03-07 | Discharge: 2024-03-07 | Disposition: A | Discharge status: Home / Self Care | Source: Ambulatory Visit | Attending: Internal Medicine | Admitting: Internal Medicine

## 2024-03-07 DIAGNOSIS — I5032 Chronic diastolic (congestive) heart failure: Secondary | ICD-10-CM | POA: Diagnosis present

## 2024-03-07 DIAGNOSIS — K7581 Nonalcoholic steatohepatitis (NASH): Secondary | ICD-10-CM | POA: Diagnosis present

## 2024-03-07 DIAGNOSIS — K7469 Other cirrhosis of liver: Secondary | ICD-10-CM | POA: Insufficient documentation

## 2024-03-07 LAB — AMMONIA: Ammonia: 13 umol/L (ref 9–35)

## 2024-05-08 IMAGING — CT CT ANGIO CHEST
2 of 6 series · 17 of 46 positions shown · IV contrast (APPLIED)
Comparison: 10/08/2021

CLINICAL DATA: Chest pain, shortness of breath

EXAM:
CT ANGIOGRAPHY CHEST WITH CONTRAST
TECHNIQUE: Multidetector CT imaging of the chest was performed using the
standard protocol during bolus administration of intravenous
contrast. Multiplanar CT image reconstructions and MIPs were
obtained to evaluate the vascular anatomy.

[Series 6: thins · axial · 0.67mm/px · z∈[+1258,+1510]mm · 14 of 395 slices shown]
[im 18/395  lung]
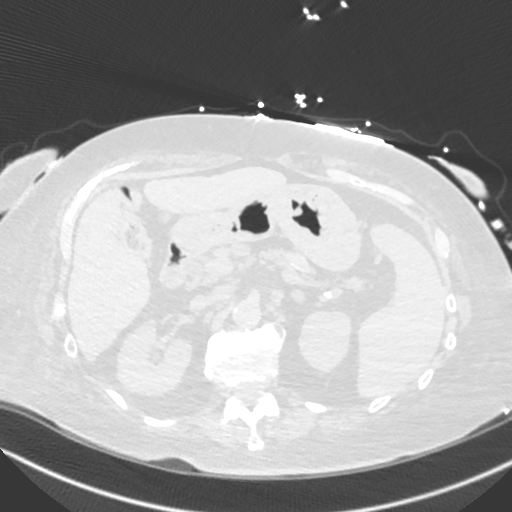
[im 52/395  soft-tissue]
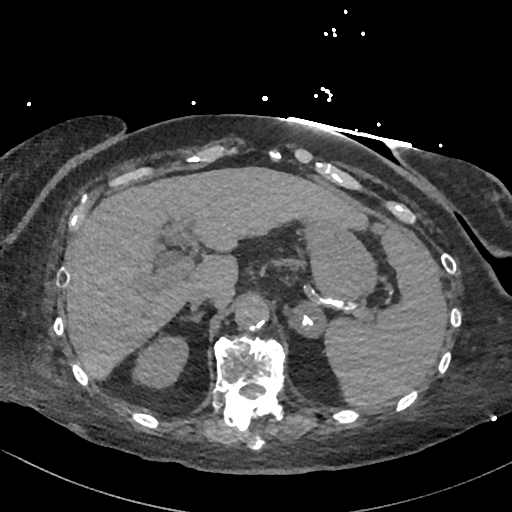
[im 69/395  lung]
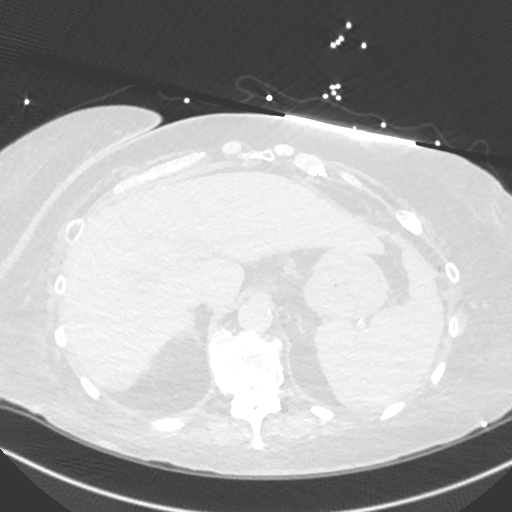
[im 103/395  soft-tissue]
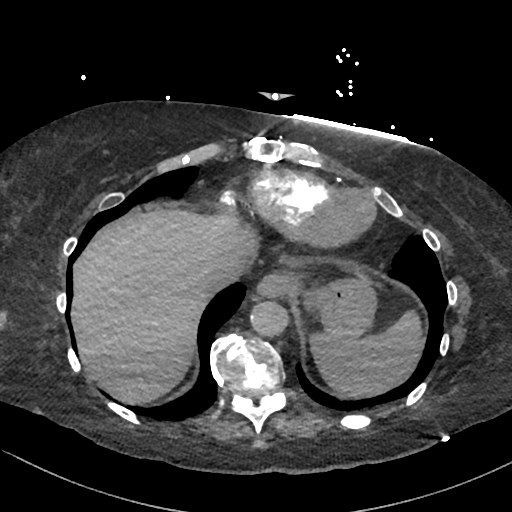
[im 138/395  lung]
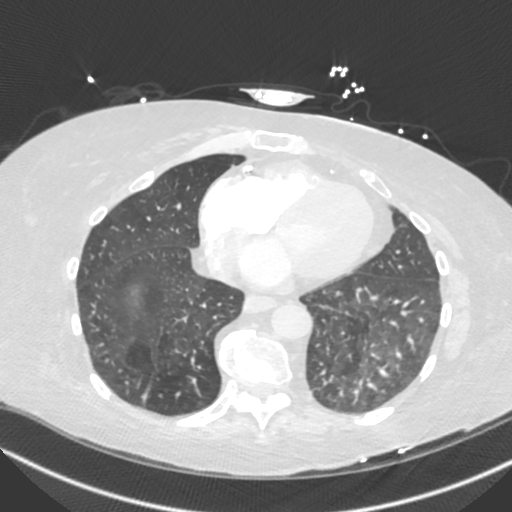
[im 155/395  soft-tissue]
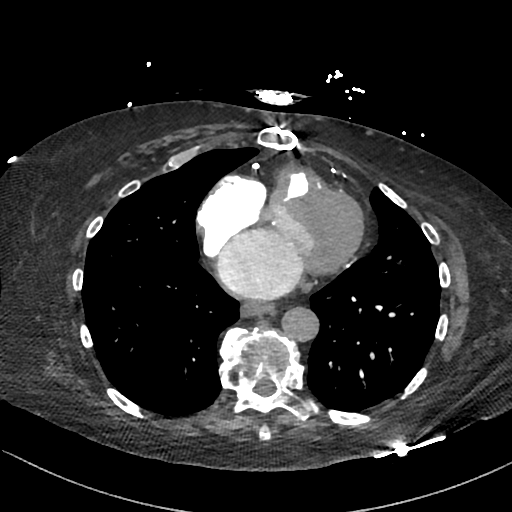
[im 189/395  lung]
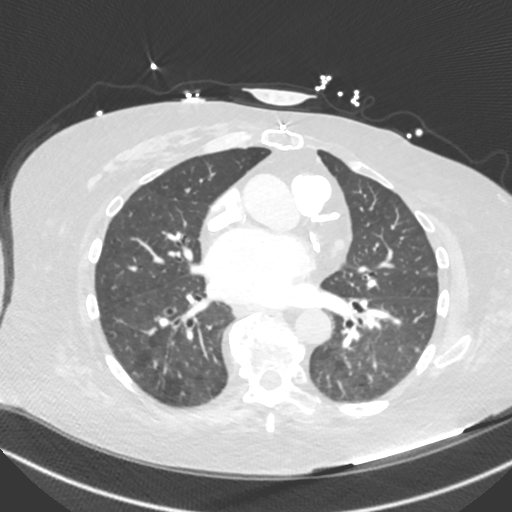
[im 206/395  soft-tissue]
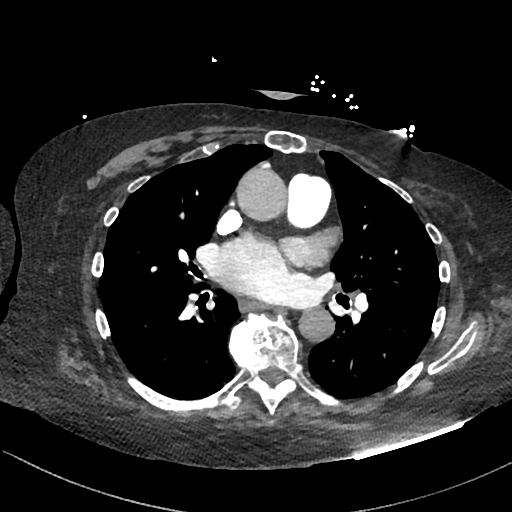
[im 240/395  lung]
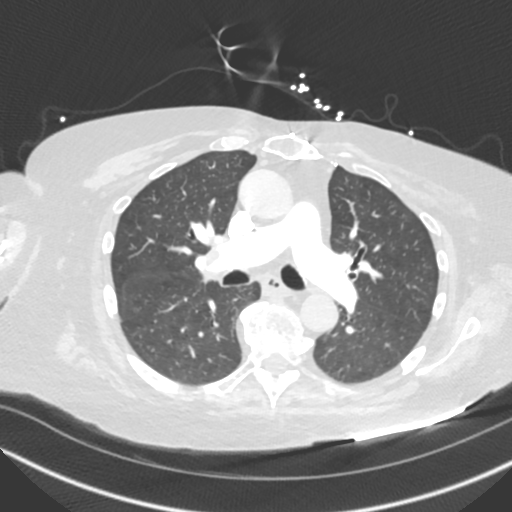
[im 257/395  soft-tissue]
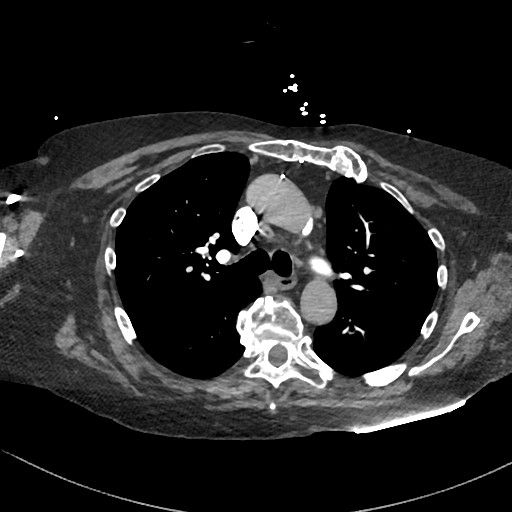
[im 292/395  lung]
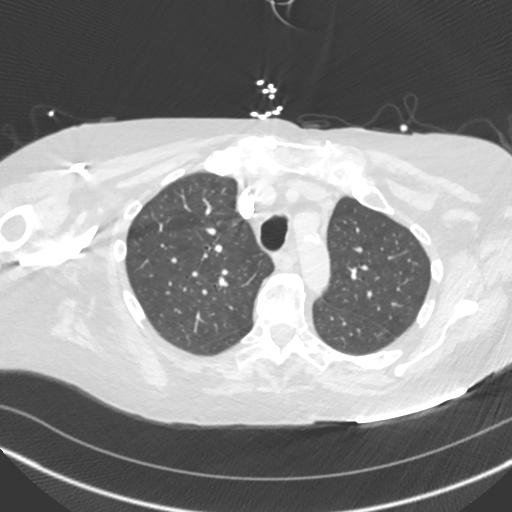
[im 326/395  soft-tissue]
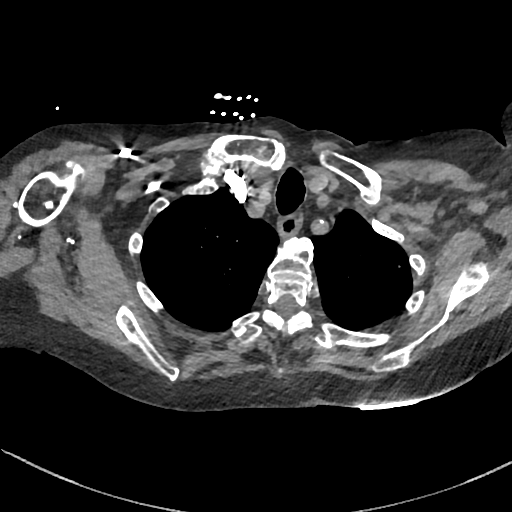
[im 343/395  lung]
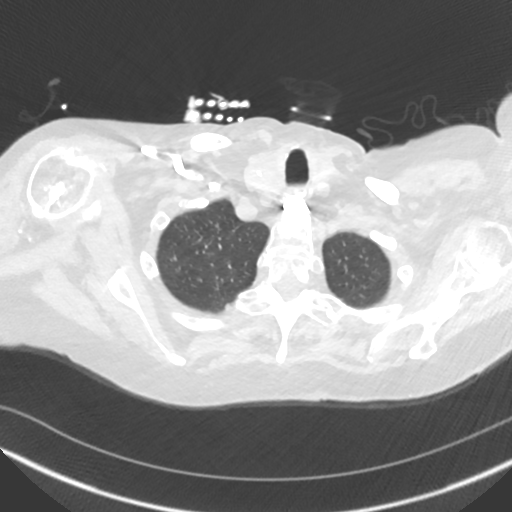
[im 377/395  soft-tissue]
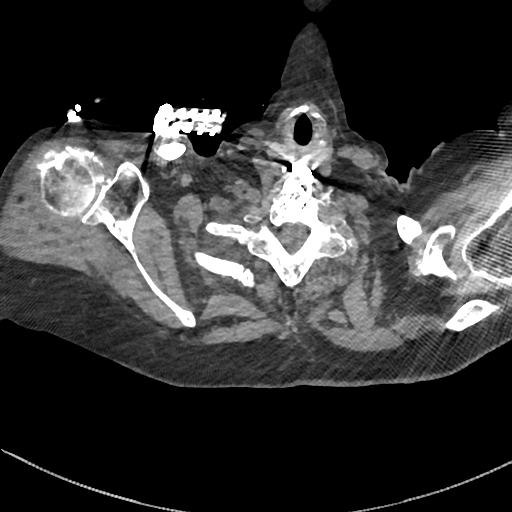

[Series 7: cor · coronal · 0.59mm/px · 3 of 137 slices shown]
[im 35/137  soft-tissue]
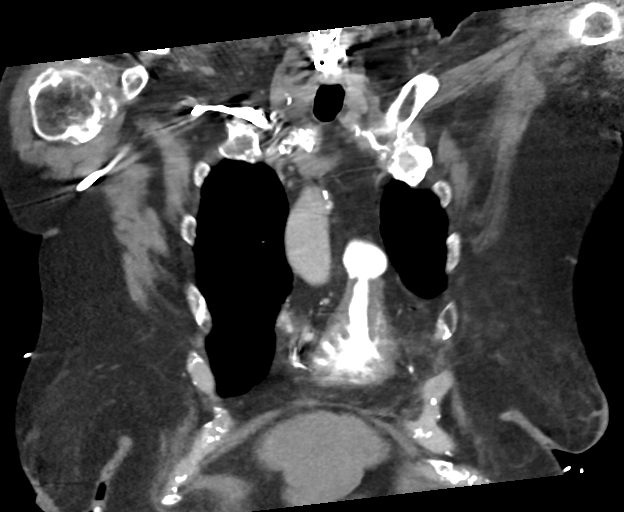
[im 69/137  soft-tissue]
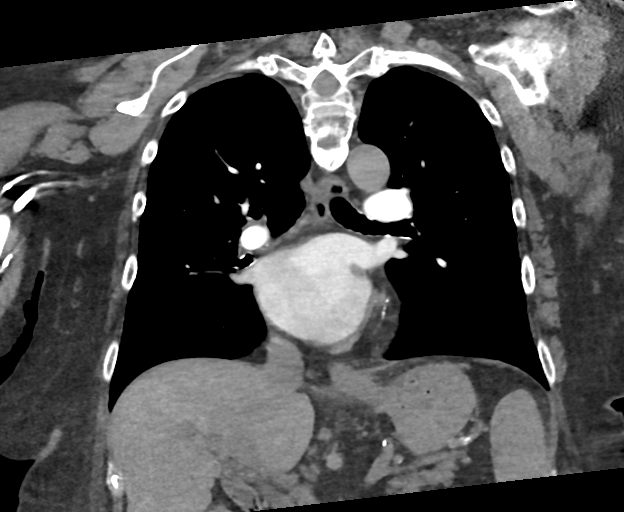
[im 103/137  soft-tissue]
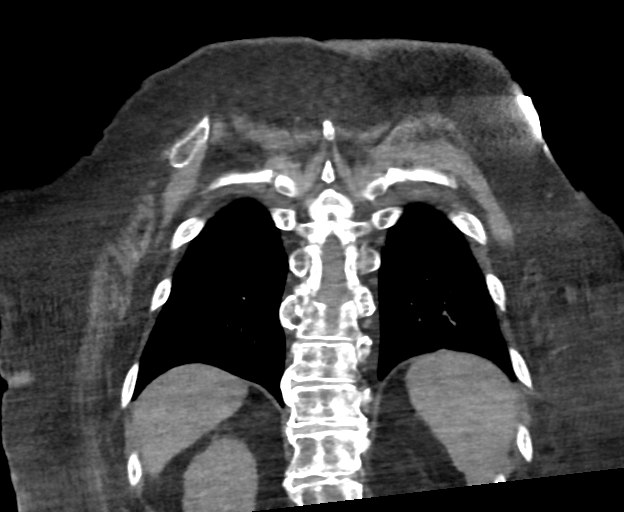

[17 of 46 positions shown; findings below may reference images not displayed]

RADIATION DOSE REDUCTION: This exam was performed according to the
departmental dose-optimization program which includes automated
exposure control, adjustment of the mA and/or kV according to
patient size and/or use of iterative reconstruction technique.

CONTRAST:  50mL OMNIPAQUE IOHEXOL 350 MG/ML SOLN
FINDINGS: Cardiovascular: There is homogeneous enhancement in thoracic aorta.
There is dilation left atrium. Coronary artery calcifications are
seen. There is previous coronary artery bypass surgery. There are no
intraluminal filling defects in the pulmonary artery branches.

Mediastinum/Nodes: No new significant lymphadenopathy seen. There is
inhomogeneous attenuation in the thyroid with low-density nodules
and coarse calcifications.

Lungs/Pleura: In image 25 of series 5, there is 4 mm nodule in the
left upper lobe. In image 41, there is a 2 mm nodule in the left
upper lobe. In image 73 there is a 3 mm nodule in the left lower
lobe. r in image 82, there is 5 mm nodule in the right middle lobe.
In image 19 there is 4 mm pleural-based nodule in the right upper
lobe. Small nodular densities in both lungs have not changed in
comparison with previous examinations dating as far back as
05/25/2013. There are faint ground-glass densities in the both mid
and both lower lung fields, more so in the left lower lobe. There is
no pleural effusion or pneumothorax.

Upper Abdomen: Surgical clips are seen in gallbladder fossa. There
is 2.2 cm nodule with coarse calcifications in the left adrenal
which appears stable.

Musculoskeletal: There is cervical fusion. There is marked spinal
stenosis at T11-T12 level in the lower thoracic spine with no
significant interval change.

Review of the MIP images confirms the above findings.
IMPRESSION: There is no evidence of pulmonary artery embolism. There is no
evidence of thoracic aortic dissection. Coronary artery disease.
Dilated left atrium.

Few scattered nodules in both lungs appear stable. There is interval
appearance of ground-glass densities in the both lungs, more so in
the left lower lobe suggesting possible interstitial pneumonia.

Marked spinal stenosis at T11-T12 level with no significant interval
change.

Other findings as described in the body of the report.

## 2024-05-08 IMAGING — CR DG CHEST 2V
1 series · 2 of 2 positions shown · non-contrast
Comparison: 05/11/2021

CLINICAL DATA: Shortness of breath.

EXAM:
CHEST - 2 VIEW

[Series 1: dg chest 2 view · 0.14mm/px · 2 of 2 slices shown]
[im 1/2]
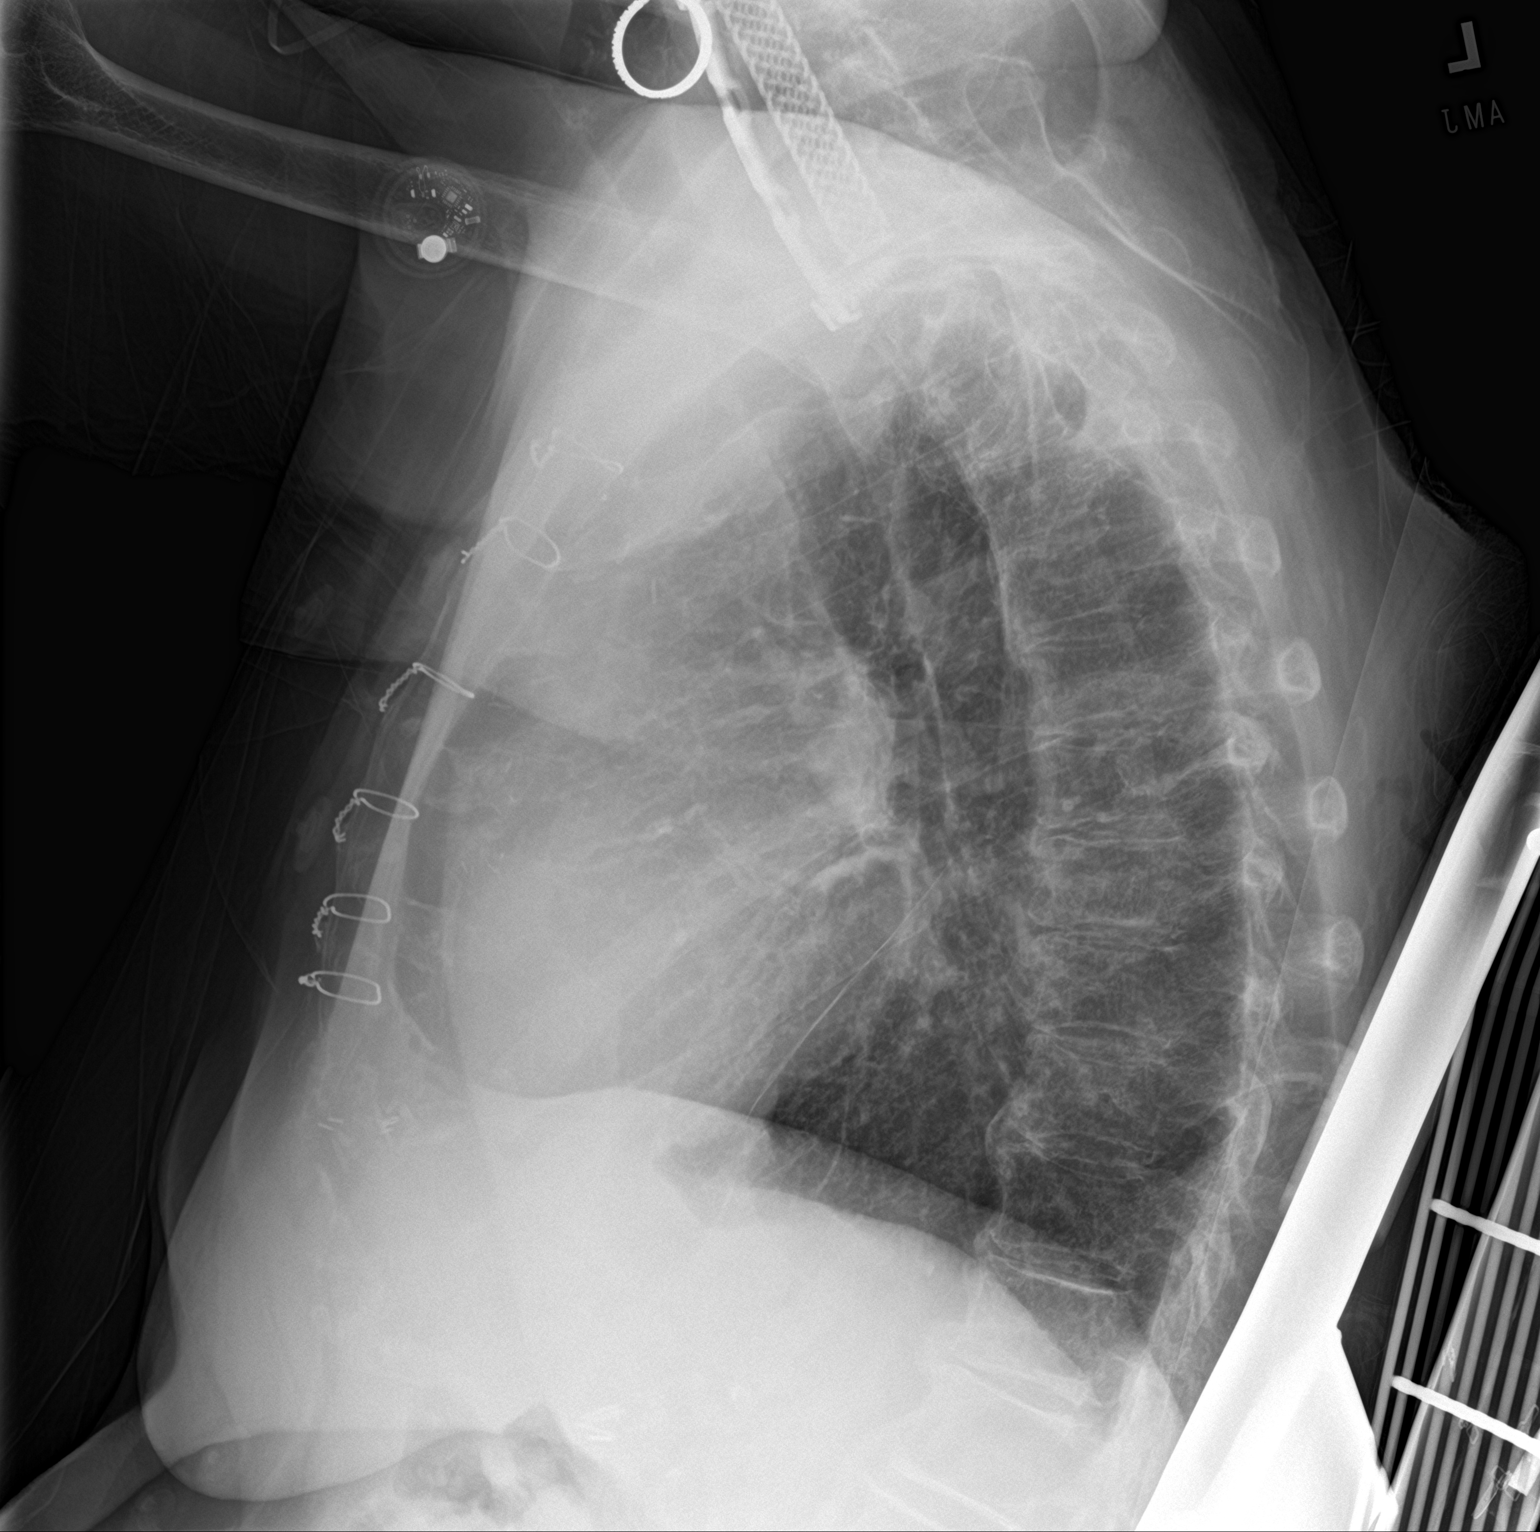
[im 2/2]
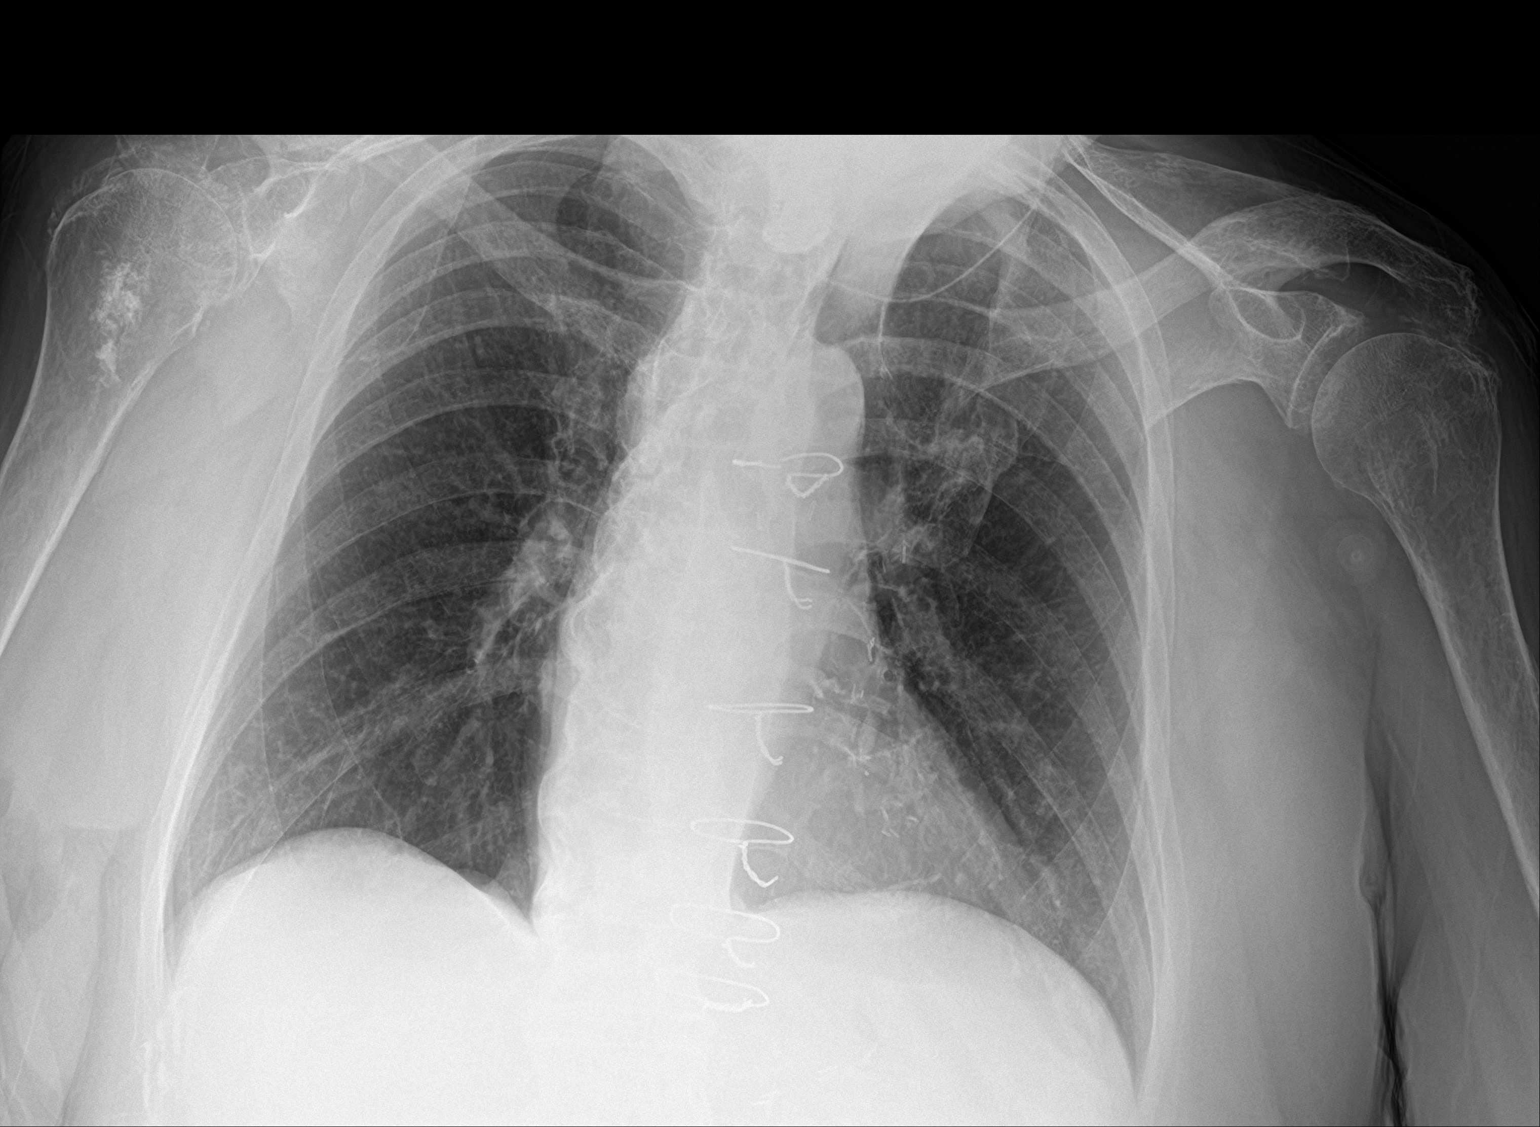

[2 of 2 positions shown; findings below may reference images not displayed]

FINDINGS: Patient is rotated to the left on the AP film. Lungs are
hyperexpanded. The lungs are clear without focal pneumonia, edema,
pneumothorax or pleural effusion. The cardiopericardial silhouette
is within normal limits for size. Bones are diffusely demineralized.
IMPRESSION: Hyperexpansion without acute cardiopulmonary findings.

## 2024-05-08 IMAGING — US US ABDOMEN COMPLETE
2 series · 15 of 25 positions shown · non-contrast
Comparison: 04/18/2021.  Chest CT of 10/13/2021

CLINICAL DATA: Cirrhosis.  HCC surveillance

EXAM:
ABDOMEN ULTRASOUND COMPLETE

[Series 1: us abdomen complete · 7 of 57 slices shown (1 of 2)]
[im 1/57]
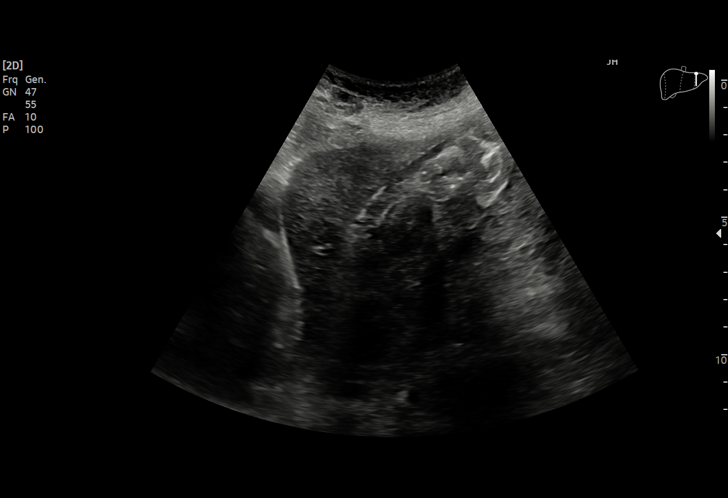
[im 12/57]
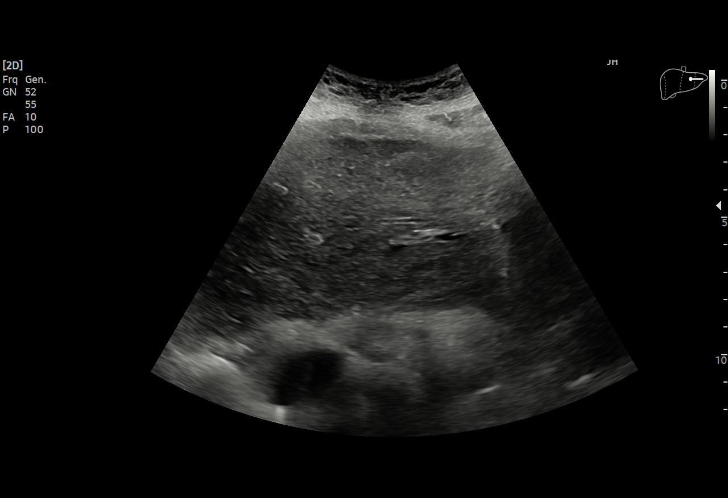
[im 23/57]
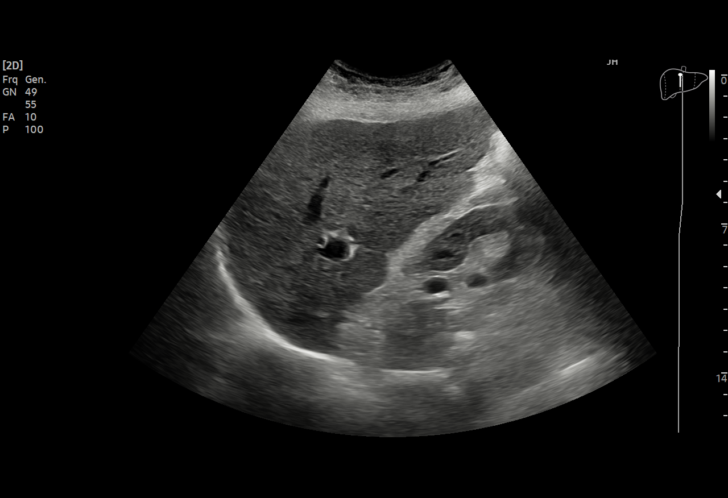
[im 29/57]
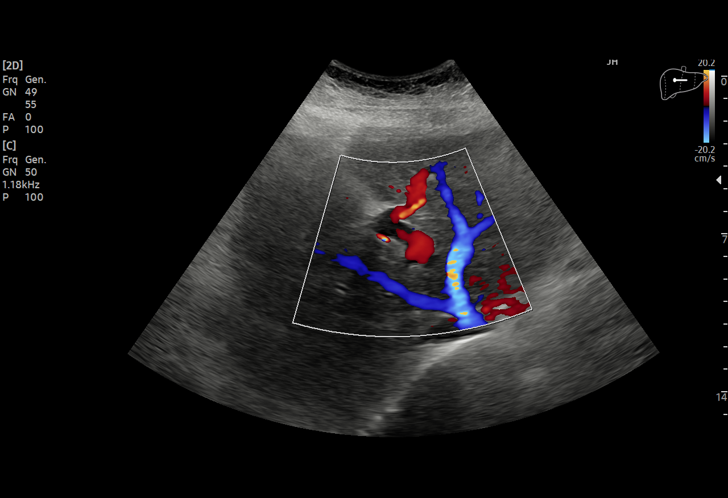
[im 40/57]
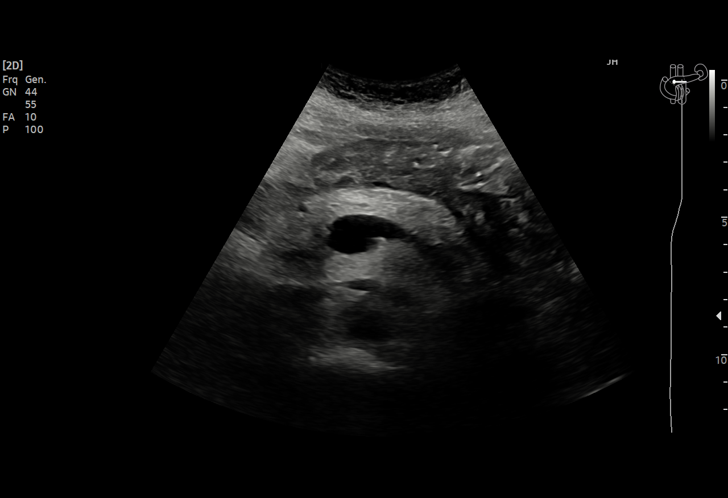
[im 51/57]
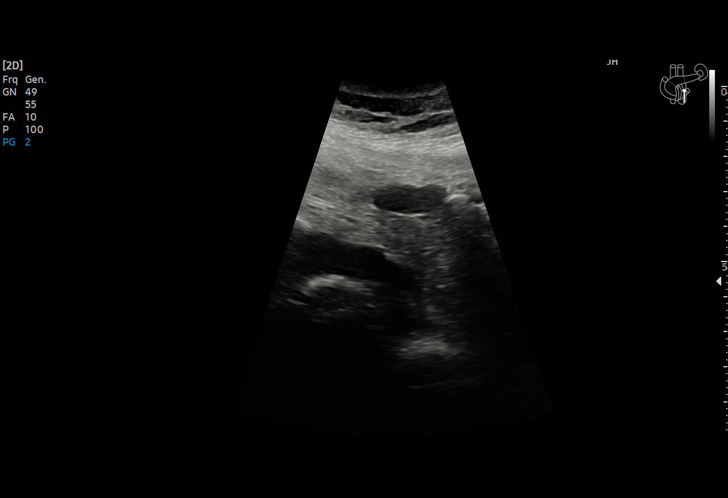
[im 57/57]
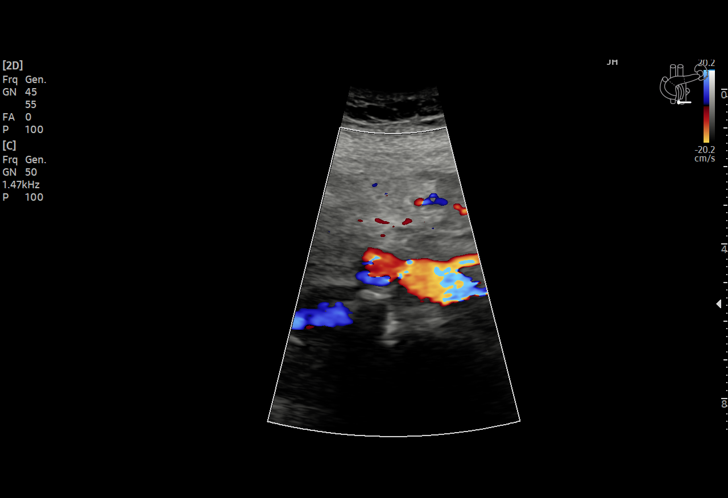

[Series 3: us abdomen complete · 8 of 73 slices shown (2 of 2)]
[im 6/73]
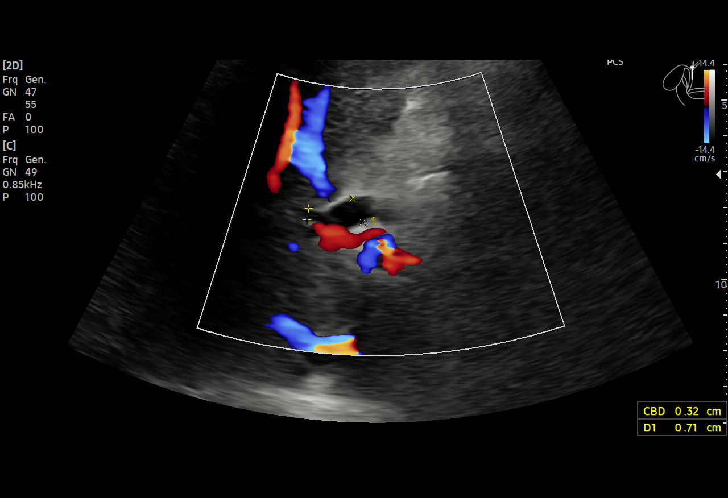
[im 17/73]
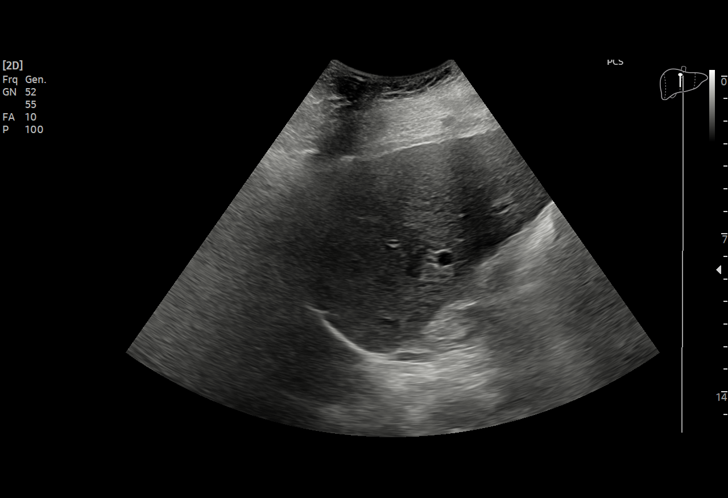
[im 23/73]
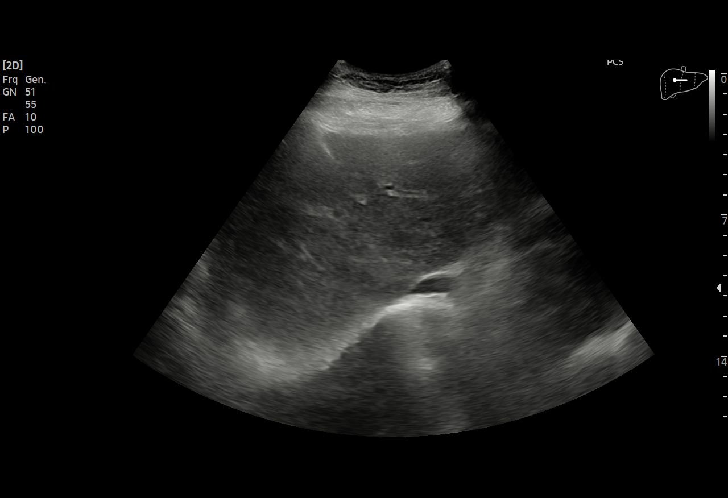
[im 34/73]
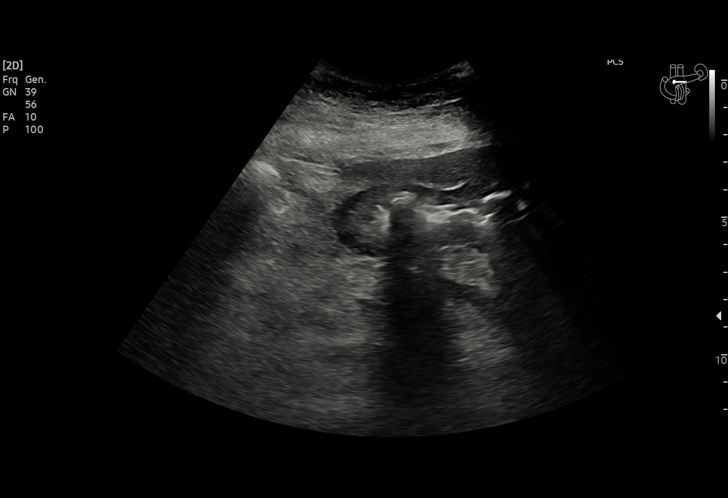
[im 45/73]
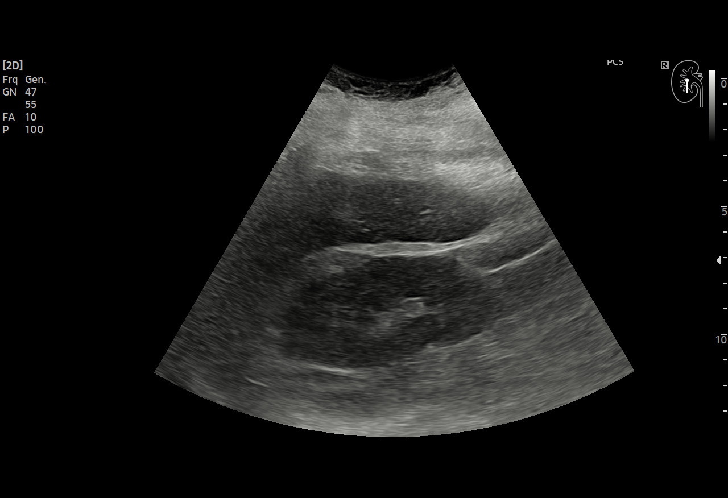
[im 50/73]
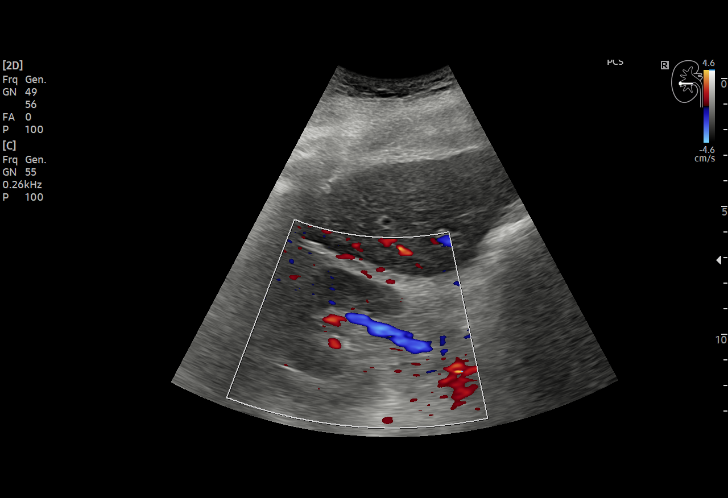
[im 61/73]
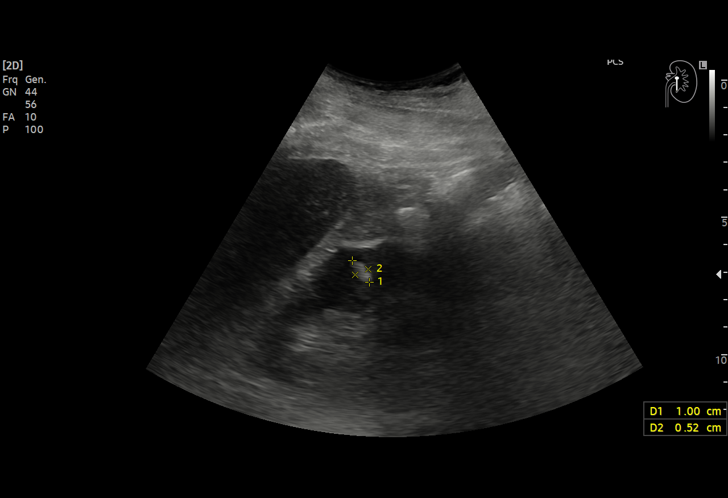
[im 73/73]
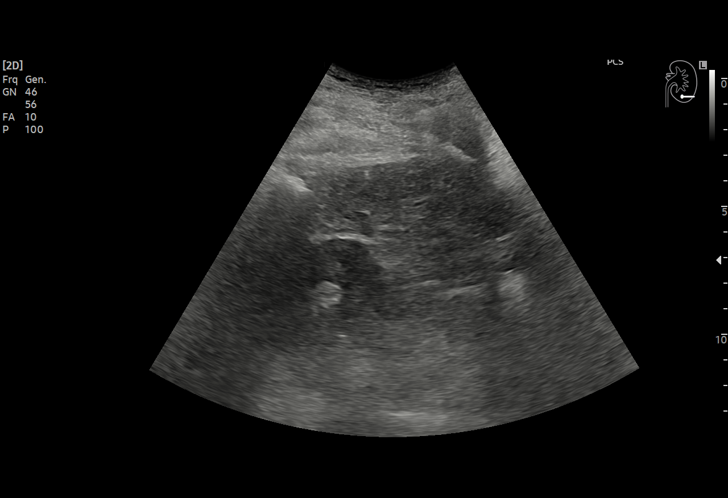

[15 of 25 positions shown; findings below may reference images not displayed]

FINDINGS: Gallbladder: Surgically absent

Common bile duct: Diameter: Normal, 3 mm

Liver: Irregular capsule with coarsened echotexture. No focal liver
lesion. Portal vein is patent on color Doppler imaging with normal
direction of blood flow towards the liver.

IVC: No abnormality visualized.

Pancreas: Poorly visualized due to overlying bowel gas.

Spleen: Size and appearance within normal limits.

Right Kidney: Length: 9.4 cm. Echogenicity within normal limits. No
mass or hydronephrosis visualized.

Left Kidney: Length: 8.9 cm echogenicity within normal limits. No
mass or hydronephrosis visualized. 8 mm interpolar echogenic focus
could be dystrophic or represent a peripheral collecting system
stone.

Abdominal aorta: No aneurysm visualized.

Other findings: None.
IMPRESSION: Cirrhosis without hepatocellular carcinoma.

Portions of anatomy obscured by overlying bowel gas.

Left renal dystrophic calcification versus less likely collecting
system calculus.

## 2024-05-12 ENCOUNTER — Other Ambulatory Visit: Payer: Self-pay

## 2024-05-12 ENCOUNTER — Encounter: Payer: Self-pay | Admitting: Oncology

## 2024-05-12 ENCOUNTER — Ambulatory Visit
Admission: RE | Admit: 2024-05-12 | Discharge: 2024-05-12 | Disposition: A | Discharge status: Home / Self Care | Source: Ambulatory Visit

## 2024-05-12 DIAGNOSIS — R634 Abnormal weight loss: Secondary | ICD-10-CM

## 2024-05-12 DIAGNOSIS — K921 Melena: Secondary | ICD-10-CM

## 2024-05-12 DIAGNOSIS — R110 Nausea: Secondary | ICD-10-CM | POA: Diagnosis present

## 2024-05-13 ENCOUNTER — Other Ambulatory Visit: Payer: Self-pay

## 2024-05-13 ENCOUNTER — Inpatient Hospital Stay
Admission: EM | Admit: 2024-05-13 | Discharge: 2024-05-16 | DRG: 641 | Disposition: A | Discharge status: Home Health | Attending: Student in an Organized Health Care Education/Training Program | Admitting: Student in an Organized Health Care Education/Training Program

## 2024-05-13 DIAGNOSIS — Z961 Presence of intraocular lens: Secondary | ICD-10-CM | POA: Diagnosis present

## 2024-05-13 DIAGNOSIS — Z885 Allergy status to narcotic agent status: Secondary | ICD-10-CM

## 2024-05-13 DIAGNOSIS — E871 Hypo-osmolality and hyponatremia: Secondary | ICD-10-CM | POA: Diagnosis present

## 2024-05-13 DIAGNOSIS — E1169 Type 2 diabetes mellitus with other specified complication: Secondary | ICD-10-CM | POA: Diagnosis present

## 2024-05-13 DIAGNOSIS — Z6822 Body mass index (BMI) 22.0-22.9, adult: Secondary | ICD-10-CM

## 2024-05-13 DIAGNOSIS — Z881 Allergy status to other antibiotic agents status: Secondary | ICD-10-CM

## 2024-05-13 DIAGNOSIS — Z87891 Personal history of nicotine dependence: Secondary | ICD-10-CM

## 2024-05-13 DIAGNOSIS — K3189 Other diseases of stomach and duodenum: Secondary | ICD-10-CM | POA: Diagnosis present

## 2024-05-13 DIAGNOSIS — E875 Hyperkalemia: Secondary | ICD-10-CM | POA: Diagnosis present

## 2024-05-13 DIAGNOSIS — R627 Adult failure to thrive: Principal | ICD-10-CM | POA: Diagnosis present

## 2024-05-13 DIAGNOSIS — G4733 Obstructive sleep apnea (adult) (pediatric): Secondary | ICD-10-CM | POA: Diagnosis present

## 2024-05-13 DIAGNOSIS — K449 Diaphragmatic hernia without obstruction or gangrene: Secondary | ICD-10-CM | POA: Diagnosis present

## 2024-05-13 DIAGNOSIS — I2510 Atherosclerotic heart disease of native coronary artery without angina pectoris: Secondary | ICD-10-CM | POA: Diagnosis present

## 2024-05-13 DIAGNOSIS — Z9841 Cataract extraction status, right eye: Secondary | ICD-10-CM

## 2024-05-13 DIAGNOSIS — I10 Essential (primary) hypertension: Secondary | ICD-10-CM | POA: Diagnosis present

## 2024-05-13 DIAGNOSIS — E7849 Other hyperlipidemia: Secondary | ICD-10-CM | POA: Diagnosis present

## 2024-05-13 DIAGNOSIS — D509 Iron deficiency anemia, unspecified: Secondary | ICD-10-CM | POA: Diagnosis present

## 2024-05-13 DIAGNOSIS — K219 Gastro-esophageal reflux disease without esophagitis: Secondary | ICD-10-CM | POA: Diagnosis present

## 2024-05-13 DIAGNOSIS — Z9842 Cataract extraction status, left eye: Secondary | ICD-10-CM

## 2024-05-13 DIAGNOSIS — K7581 Nonalcoholic steatohepatitis (NASH): Secondary | ICD-10-CM | POA: Diagnosis present

## 2024-05-13 DIAGNOSIS — Z96652 Presence of left artificial knee joint: Secondary | ICD-10-CM | POA: Diagnosis present

## 2024-05-13 DIAGNOSIS — Z90710 Acquired absence of both cervix and uterus: Secondary | ICD-10-CM

## 2024-05-13 DIAGNOSIS — D631 Anemia in chronic kidney disease: Secondary | ICD-10-CM | POA: Diagnosis present

## 2024-05-13 DIAGNOSIS — N179 Acute kidney failure, unspecified: Principal | ICD-10-CM | POA: Diagnosis present

## 2024-05-13 DIAGNOSIS — R634 Abnormal weight loss: Secondary | ICD-10-CM | POA: Diagnosis present

## 2024-05-13 DIAGNOSIS — K7460 Unspecified cirrhosis of liver: Secondary | ICD-10-CM | POA: Diagnosis present

## 2024-05-13 DIAGNOSIS — Z860100 Personal history of colon polyps, unspecified: Secondary | ICD-10-CM

## 2024-05-13 DIAGNOSIS — Z880 Allergy status to penicillin: Secondary | ICD-10-CM

## 2024-05-13 DIAGNOSIS — I5032 Chronic diastolic (congestive) heart failure: Secondary | ICD-10-CM | POA: Diagnosis present

## 2024-05-13 DIAGNOSIS — Z955 Presence of coronary angioplasty implant and graft: Secondary | ICD-10-CM

## 2024-05-13 DIAGNOSIS — K7469 Other cirrhosis of liver: Secondary | ICD-10-CM | POA: Diagnosis present

## 2024-05-13 DIAGNOSIS — M51379 Other intervertebral disc degeneration, lumbosacral region without mention of lumbar back pain or lower extremity pain: Secondary | ICD-10-CM | POA: Diagnosis present

## 2024-05-13 DIAGNOSIS — N1832 Chronic kidney disease, stage 3b: Secondary | ICD-10-CM | POA: Diagnosis present

## 2024-05-13 DIAGNOSIS — E1165 Type 2 diabetes mellitus with hyperglycemia: Secondary | ICD-10-CM | POA: Diagnosis present

## 2024-05-13 DIAGNOSIS — I130 Hypertensive heart and chronic kidney disease with heart failure and stage 1 through stage 4 chronic kidney disease, or unspecified chronic kidney disease: Secondary | ICD-10-CM | POA: Diagnosis present

## 2024-05-13 DIAGNOSIS — Z794 Long term (current) use of insulin: Secondary | ICD-10-CM

## 2024-05-13 DIAGNOSIS — N184 Chronic kidney disease, stage 4 (severe): Secondary | ICD-10-CM | POA: Diagnosis present

## 2024-05-13 DIAGNOSIS — Z79899 Other long term (current) drug therapy: Secondary | ICD-10-CM

## 2024-05-13 DIAGNOSIS — E1122 Type 2 diabetes mellitus with diabetic chronic kidney disease: Secondary | ICD-10-CM | POA: Diagnosis present

## 2024-05-13 DIAGNOSIS — Z882 Allergy status to sulfonamides status: Secondary | ICD-10-CM

## 2024-05-13 DIAGNOSIS — K269 Duodenal ulcer, unspecified as acute or chronic, without hemorrhage or perforation: Secondary | ICD-10-CM | POA: Diagnosis present

## 2024-05-13 DIAGNOSIS — E1140 Type 2 diabetes mellitus with diabetic neuropathy, unspecified: Secondary | ICD-10-CM | POA: Diagnosis present

## 2024-05-13 DIAGNOSIS — Z951 Presence of aortocoronary bypass graft: Secondary | ICD-10-CM

## 2024-05-13 DIAGNOSIS — Z8249 Family history of ischemic heart disease and other diseases of the circulatory system: Secondary | ICD-10-CM

## 2024-05-13 DIAGNOSIS — J069 Acute upper respiratory infection, unspecified: Secondary | ICD-10-CM | POA: Diagnosis present

## 2024-05-13 DIAGNOSIS — R4189 Other symptoms and signs involving cognitive functions and awareness: Secondary | ICD-10-CM | POA: Diagnosis present

## 2024-05-13 DIAGNOSIS — N189 Chronic kidney disease, unspecified: Principal | ICD-10-CM

## 2024-05-13 DIAGNOSIS — I252 Old myocardial infarction: Secondary | ICD-10-CM

## 2024-05-13 DIAGNOSIS — K317 Polyp of stomach and duodenum: Secondary | ICD-10-CM | POA: Diagnosis present

## 2024-05-13 LAB — URINALYSIS, ROUTINE W REFLEX MICROSCOPIC
Bilirubin Urine: NEGATIVE
Glucose, UA: 150 mg/dL — AB
Hgb urine dipstick: NEGATIVE
Ketones, ur: NEGATIVE mg/dL
Leukocytes,Ua: NEGATIVE
Nitrite: NEGATIVE
Protein, ur: NEGATIVE mg/dL
Specific Gravity, Urine: 1.008 (ref 1.005–1.030)
pH: 6 (ref 5.0–8.0)

## 2024-05-13 LAB — CBC
HCT: 34.9 % — ABNORMAL LOW (ref 36.0–46.0)
Hemoglobin: 11.6 g/dL — ABNORMAL LOW (ref 12.0–15.0)
MCH: 26.7 pg (ref 26.0–34.0)
MCHC: 33.2 g/dL (ref 30.0–36.0)
MCV: 80.2 fL (ref 80.0–100.0)
Platelets: 191 K/uL (ref 150–400)
RBC: 4.35 MIL/uL (ref 3.87–5.11)
RDW: 15.1 % (ref 11.5–15.5)
WBC: 7.9 K/uL (ref 4.0–10.5)
nRBC: 0 % (ref 0.0–0.2)

## 2024-05-13 LAB — FERRITIN: Ferritin: 57 ng/mL (ref 11–307)

## 2024-05-13 LAB — COMPREHENSIVE METABOLIC PANEL WITH GFR
ALT: 6 U/L (ref 0–44)
AST: 15 U/L (ref 15–41)
Albumin: 3.7 g/dL (ref 3.5–5.0)
Alkaline Phosphatase: 138 U/L — ABNORMAL HIGH (ref 38–126)
Anion gap: 11 (ref 5–15)
BUN: 68 mg/dL — ABNORMAL HIGH (ref 8–23)
CO2: 30 mmol/L (ref 22–32)
Calcium: 10.1 mg/dL (ref 8.9–10.3)
Chloride: 81 mmol/L — ABNORMAL LOW (ref 98–111)
Creatinine, Ser: 2.01 mg/dL — ABNORMAL HIGH (ref 0.44–1.00)
GFR, Estimated: 24 mL/min — ABNORMAL LOW
Glucose, Bld: 575 mg/dL (ref 70–99)
Potassium: 5.3 mmol/L — ABNORMAL HIGH (ref 3.5–5.1)
Sodium: 122 mmol/L — ABNORMAL LOW (ref 135–145)
Total Bilirubin: 0.5 mg/dL (ref 0.0–1.2)
Total Protein: 7.8 g/dL (ref 6.5–8.1)

## 2024-05-13 LAB — BASIC METABOLIC PANEL WITH GFR
Anion gap: 12 (ref 5–15)
BUN: 69 mg/dL — ABNORMAL HIGH (ref 8–23)
CO2: 29 mmol/L (ref 22–32)
Calcium: 10.4 mg/dL — ABNORMAL HIGH (ref 8.9–10.3)
Chloride: 85 mmol/L — ABNORMAL LOW (ref 98–111)
Creatinine, Ser: 1.85 mg/dL — ABNORMAL HIGH (ref 0.44–1.00)
GFR, Estimated: 27 mL/min — ABNORMAL LOW
Glucose, Bld: 231 mg/dL — ABNORMAL HIGH (ref 70–99)
Potassium: 4.8 mmol/L (ref 3.5–5.1)
Sodium: 125 mmol/L — ABNORMAL LOW (ref 135–145)

## 2024-05-13 LAB — CBG MONITORING, ED
Glucose-Capillary: 577 mg/dL (ref 70–99)
Glucose-Capillary: 291 mg/dL — ABNORMAL HIGH (ref 70–99)
Glucose-Capillary: 393 mg/dL — ABNORMAL HIGH (ref 70–99)

## 2024-05-13 LAB — LIPASE, BLOOD: Lipase: 53 U/L — ABNORMAL HIGH (ref 11–51)

## 2024-05-13 LAB — IRON AND TIBC
Iron: 27 ug/dL — ABNORMAL LOW (ref 28–170)
Saturation Ratios: 8 % — ABNORMAL LOW (ref 10.4–31.8)
TIBC: 347 ug/dL (ref 250–450)
UIBC: 320 ug/dL

## 2024-05-13 LAB — MAGNESIUM: Magnesium: 2.2 mg/dL (ref 1.7–2.4)

## 2024-05-13 LAB — RESP PANEL BY RT-PCR (RSV, FLU A&B, COVID)  RVPGX2
Influenza A by PCR: NEGATIVE
Influenza B by PCR: NEGATIVE
Resp Syncytial Virus by PCR: NEGATIVE
SARS Coronavirus 2 by RT PCR: NEGATIVE

## 2024-05-13 LAB — OSMOLALITY, URINE: Osmolality, Ur: 291 mosm/kg — ABNORMAL LOW (ref 300–900)

## 2024-05-13 LAB — GLUCOSE, CAPILLARY: Glucose-Capillary: 207 mg/dL — ABNORMAL HIGH (ref 70–99)

## 2024-05-13 MED ORDER — LACTATED RINGERS IV SOLN: INTRAVENOUS | Status: DC

## 2024-05-13 MED ORDER — CALCIUM GLUCONATE-NACL 1-0.675 GM/50ML-% IV SOLN
1.0000 g | Freq: Once | INTRAVENOUS | Status: AC
  Administered 2024-05-13: 1000 mg via INTRAVENOUS
  Filled 2024-05-13: qty 50

## 2024-05-13 MED ORDER — INSULIN ASPART 100 UNIT/ML IJ SOLN
10.0000 [IU] | Freq: Once | INTRAMUSCULAR | Status: AC
  Administered 2024-05-13: 10 [IU] via INTRAVENOUS
  Filled 2024-05-13: qty 10

## 2024-05-13 MED ORDER — ACETAMINOPHEN 650 MG RE SUPP: 650.0000 mg | Freq: Four times a day (QID) | RECTAL | Status: DC | PRN

## 2024-05-13 MED ORDER — HEPARIN SODIUM (PORCINE) 5000 UNIT/ML IJ SOLN
5000.0000 [IU] | Freq: Three times a day (TID) | INTRAMUSCULAR | Status: DC
  Administered 2024-05-13 – 2024-05-16 (×7): 5000 [IU] via SUBCUTANEOUS
  Filled 2024-05-13 (×7): qty 1

## 2024-05-13 MED ORDER — ONDANSETRON HCL 4 MG/2ML IJ SOLN
4.0000 mg | Freq: Once | INTRAMUSCULAR | Status: AC
  Administered 2024-05-13: 4 mg via INTRAVENOUS
  Filled 2024-05-13: qty 2

## 2024-05-13 MED ORDER — SENNOSIDES-DOCUSATE SODIUM 8.6-50 MG PO TABS: 1.0000 | ORAL_TABLET | Freq: Every evening | ORAL | Status: DC | PRN

## 2024-05-13 MED ORDER — SODIUM CHLORIDE 0.9% FLUSH
3.0000 mL | Freq: Two times a day (BID) | INTRAVENOUS | Status: DC
  Administered 2024-05-13 – 2024-05-16 (×7): 3 mL via INTRAVENOUS

## 2024-05-13 MED ORDER — ACETAMINOPHEN 325 MG PO TABS
650.0000 mg | ORAL_TABLET | Freq: Four times a day (QID) | ORAL | Status: DC | PRN
  Administered 2024-05-14 – 2024-05-16 (×4): 650 mg via ORAL
  Filled 2024-05-13 (×4): qty 2

## 2024-05-13 MED ORDER — PATIROMER SORBITEX CALCIUM 8.4 G PO PACK
25.2000 g | PACK | Freq: Once | ORAL | Status: AC
  Administered 2024-05-13: 25.2 g via ORAL
  Filled 2024-05-13: qty 3

## 2024-05-13 MED ORDER — ONDANSETRON HCL 4 MG/2ML IJ SOLN
4.0000 mg | Freq: Four times a day (QID) | INTRAMUSCULAR | Status: DC | PRN
  Administered 2024-05-15: 4 mg via INTRAVENOUS
  Filled 2024-05-13: qty 2

## 2024-05-13 MED ORDER — INSULIN ASPART 100 UNIT/ML IJ SOLN
0.0000 [IU] | Freq: Three times a day (TID) | INTRAMUSCULAR | Status: DC
  Administered 2024-05-13: 15 [IU] via SUBCUTANEOUS
  Administered 2024-05-14: 8 [IU] via SUBCUTANEOUS
  Administered 2024-05-14: 3 [IU] via SUBCUTANEOUS
  Administered 2024-05-14: 15 [IU] via SUBCUTANEOUS
  Administered 2024-05-15: 5 [IU] via SUBCUTANEOUS
  Administered 2024-05-15 – 2024-05-16 (×2): 8 [IU] via SUBCUTANEOUS
  Administered 2024-05-16: 11 [IU] via SUBCUTANEOUS
  Filled 2024-05-13: qty 15
  Filled 2024-05-13: qty 11
  Filled 2024-05-13: qty 3
  Filled 2024-05-13 (×3): qty 8
  Filled 2024-05-13: qty 5
  Filled 2024-05-13: qty 15

## 2024-05-13 MED ORDER — ONDANSETRON HCL 4 MG PO TABS
4.0000 mg | ORAL_TABLET | Freq: Four times a day (QID) | ORAL | Status: DC | PRN
  Administered 2024-05-15: 4 mg via ORAL
  Filled 2024-05-13: qty 1

## 2024-05-13 MED ORDER — SODIUM CHLORIDE 0.9 % IV BOLUS
2000.0000 mL | Freq: Once | INTRAVENOUS | Status: AC
  Administered 2024-05-13: 2000 mL via INTRAVENOUS

## 2024-05-13 MED ORDER — INSULIN ASPART 100 UNIT/ML IJ SOLN
0.0000 [IU] | Freq: Every day | INTRAMUSCULAR | Status: DC
  Administered 2024-05-13: 2 [IU] via SUBCUTANEOUS
  Filled 2024-05-13: qty 2

## 2024-05-13 NOTE — ED Provider Notes (Signed)
 "  [facility] Provider Note   Event Date/Time   First MD Initiated Contact with Patient 05/13/24 1102     (approximate) History  Abdominal Pain  HPI Rhonda Tyler is a 85 y.o. female with a past medical history of anemia of chronic disease, CKD, CAD, PSVT, type 2 diabetes, and cirrhosis who presents complaining of of abdominal pain with dark tarry stools, decreased appetite, nausea, abdominal pain, and generalized weakness.  Patient states that she was seen months ago and required a transfusion for this rectal bleeding.  Patient also states that she has lost 30 pounds in the past 2 months.  Patient is currently on antibiotic for upper respiratory infection that she believes is azithromycin .  Patient had a CT scan of the abdomen yesterday that did not show any evidence of acute abnormalities. ROS: Patient currently denies any vision changes, tinnitus, difficulty speaking, facial droop, sore throat, chest pain, shortness of breath, diarrhea, dysuria, or weakness/numbness/paresthesias in any extremity   Physical Exam  Triage Vital Signs: ED Triage Vitals [05/13/24 0912]  Encounter Vitals Group     BP (!) 138/58     Girls Systolic BP Percentile      Girls Diastolic BP Percentile      Boys Systolic BP Percentile      Boys Diastolic BP Percentile      Pulse Rate 63     Resp 18     Temp 98.4 F (36.9 C)     Temp Source Oral     SpO2 98 %     Weight      Height      Head Circumference      Peak Flow      Pain Score 4     Pain Loc      Pain Education      Exclude from Growth Chart    Most recent vital signs: Vitals:   05/13/24 1230 05/13/24 1336  BP: 123/61   Pulse: 88   Resp: 17   Temp:  98 F (36.7 C)  SpO2: 100%    General: Awake, oriented x4. CV:  Good peripheral perfusion. Resp:  Normal effort. Abd:  No distention.  Generalized tenderness to palpation Other:  Elderly overweight Caucasian female resting comfortably in no acute distress ED Results  / Procedures / Treatments  Labs (all labs ordered are listed, but only abnormal results are displayed) Labs Reviewed  LIPASE, BLOOD - Abnormal; Notable for the following components:      Result Value   Lipase 53 (*)    All other components within normal limits  COMPREHENSIVE METABOLIC PANEL WITH GFR - Abnormal; Notable for the following components:   Sodium 122 (*)    Potassium 5.3 (*)    Chloride 81 (*)    Glucose, Bld 575 (*)    BUN 68 (*)    Creatinine, Ser 2.01 (*)    Alkaline Phosphatase 138 (*)    GFR, Estimated 24 (*)    All other components within normal limits  CBC - Abnormal; Notable for the following components:   Hemoglobin 11.6 (*)    HCT 34.9 (*)    All other components within normal limits  BASIC METABOLIC PANEL WITH GFR - Abnormal; Notable for the following components:   Sodium 125 (*)    Chloride 85 (*)    Glucose, Bld 231 (*)    BUN 69 (*)    Creatinine, Ser 1.85 (*)    Calcium  10.4 (*)  GFR, Estimated 27 (*)    All other components within normal limits  CBG MONITORING, ED - Abnormal; Notable for the following components:   Glucose-Capillary 577 (*)    All other components within normal limits  CBG MONITORING, ED - Abnormal; Notable for the following components:   Glucose-Capillary 291 (*)    All other components within normal limits  RESP PANEL BY RT-PCR (RSV, FLU A&B, COVID)  RVPGX2  URINALYSIS, ROUTINE W REFLEX MICROSCOPIC  MAGNESIUM   RADIOLOGY ED MD interpretation: CT of the abdomen pelvis without IV contrast shows no acute inflammatory process - All radiology independently interpreted and agree with radiology assessment Official radiology report(s): No results found.  PROCEDURES: Critical Care performed: No Procedures MEDICATIONS ORDERED IN ED: Medications  sodium chloride  flush (NS) 0.9 % injection 3 mL (has no administration in time range)  acetaminophen  (TYLENOL ) tablet 650 mg (has no administration in time range)    Or  acetaminophen   (TYLENOL ) suppository 650 mg (has no administration in time range)  senna-docusate (Senokot-S) tablet 1 tablet (has no administration in time range)  heparin  injection 5,000 Units (has no administration in time range)  insulin  aspart (novoLOG ) injection 0-15 Units (has no administration in time range)  insulin  aspart (novoLOG ) injection 0-5 Units (has no administration in time range)  ondansetron  (ZOFRAN ) tablet 4 mg (has no administration in time range)    Or  ondansetron  (ZOFRAN ) injection 4 mg (has no administration in time range)  sodium chloride  0.9 % bolus 2,000 mL (2,000 mLs Intravenous New Bag/Given 05/13/24 1210)  insulin  aspart (novoLOG ) injection 10 Units (10 Units Intravenous Given 05/13/24 1154)  calcium  gluconate 1 g/ 50 mL sodium chloride  IVPB (1,000 mg Intravenous New Bag/Given 05/13/24 1159)  patiromer  (VELTASSA ) packet 25.2 g (25.2 g Oral Given 05/13/24 1217)  ondansetron  (ZOFRAN ) injection 4 mg (4 mg Intravenous Given 05/13/24 1338)   IMPRESSION / MDM / ASSESSMENT AND PLAN / ED COURSE  I reviewed the triage vital signs and the nursing notes.                             The patient is on the cardiac monitor to evaluate for evidence of arrhythmia and/or significant heart rate changes. Patient's presentation is most consistent with acute presentation with potential threat to life or bodily function. Patient is an 84 year old female with the above-stated past medical history who presents complaining of abdominal pain, nausea, decreased appetite, and generalized weakness that is been worsening over the past month and a setting of intermittent dark tarry stools DDx: Symptomatic anemia, hyponatremia, protein calorie malnutrition, DKA Plan: CBC, CMP, UA, lipase  Patient's laboratory evaluation significant for hyperglycemia without evidence of DKA (anion gap 11), hyponatremia, hyperkalemia, and worsening kidney function.  As patient does show signs of dehydration and decreased p.o. intake,  will try shifting medications as well as hydration and repeat BMP  On repeat BMP, patient's potassium normalized to 4.8 however patient's sodium still lower at 125 and creatinine remains elevated at 1.85.  This is a significant decrease from previous (GFR 43-->27).  Given only partial resolution of patient's symptoms, she will require admission to the internal medicine service for further evaluation and management.  Dispo: Admit to medicine   FINAL CLINICAL IMPRESSION(S) / ED DIAGNOSES   Final diagnoses:  Acute kidney injury superimposed on chronic kidney disease  Hyponatremia   Rx / DC Orders   ED Discharge Orders     None  Note:  This document was prepared using Dragon voice recognition software and may include unintentional dictation errors.   Rubina Basinski K, MD 05/13/24 1513  "

## 2024-05-13 NOTE — ED Triage Notes (Addendum)
 Pt to ED via POV from home. Pt reports abd pain x1 month with dark tarry stools. Pt also had CT scans of abdomen yesterday. Pt also reports decreased appetite, nausea and abd pain. HGB 9.8 yesterday with hx of blood transfusions. Pt currently on antibiotic for URI. Pt also with 30lbs wt loss in last 2 months.

## 2024-05-13 NOTE — H&P (Signed)
 " History and Physical    Rhonda Tyler FMW:[id number] DOB: Jul 22, 1940 DOA: 05/13/2024  DOS: the patient was seen and examined on 05/13/2024  PCP: Lenon Layman ORN, MD   Patient coming from: Home  I have personally briefly reviewed patient's old medical records in [facility] and CareEverywhere  HPI:   Rhonda Tyler is a 84 y.o. 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  She was seen by her PCP yesterday and I reviewed this note for patient reported having GI symptoms for 1 year including nausea, dark stools and decreased appetite.  This resulted in patient losing around 30 pounds in the last 3 months per PCP notes.  She also had a complaint of URI symptoms and was given Zithromax .  Stat imaging was pursued that did not show any acute findings but given the patient was having persistent symptoms came to the ED. On arrival to the ED patient was noted to be HDS stable.  Lab work obtained.  CBC without leukocytosis, hemoglobin improved from baseline which is likely secondary to hemoconcentration.  CMP showed significant hyponatremia, mild hyperkalemia and significant hyperglycemia along with significant AKI.  Lipase was mildly elevated at 53.  Given these metabolic derangements, [facility] contacted for admission.  Review of Systems: As mentioned in the history of present illness. All other systems reviewed and are negative.   Past Medical History:  Diagnosis Date   Anginal pain    Arthritis    B12 deficiency anemia    Cervical disc disease    Cervical disc disease    Chest pain    Cholecystolithiasis    Chronic kidney disease    Cirrhosis of liver (HCC) 11/2019   Colon polyp    Coronary artery disease    Cutaneous sarcoidosis (HCC)    Diabetes mellitus without complication (HCC)     Pt takes insulin    Diabetic neuropathy (HCC)    GERD (gastroesophageal reflux disease)    Headache    migaines in the past   Hemorrhoid    History of diabetic neuropathy    Hypercalcemia    Hyperlipemia    Hypertension    IBS (irritable bowel syndrome)    Iron  deficiency anemia    Myocardial infarction (HCC)    Pneumonia    Sleep apnea    NO CPAP    Past Surgical History:  Procedure Laterality Date   ABDOMINAL HYSTERECTOMY     arthroscopic rotator cuff repair     BACK SURGERY     CARDIAC CATHETERIZATION     CATARACT EXTRACTION W/PHACO Right 01/26/2022   Procedure: CATARACT EXTRACTION PHACO AND INTRAOCULAR LENS PLACEMENT (IOC) RIGHT DIABETIC;  Surgeon: Myrna Adine Anes, MD;  Location: [facility];  Service: Ophthalmology;  Laterality: Right;  2.90 0:29.3   CATARACT EXTRACTION W/PHACO Left 02/09/2022   Procedure: CATARACT EXTRACTION PHACO AND INTRAOCULAR LENS PLACEMENT (IOC) LEFT DIABETIC 3.54 00:32.3;  Surgeon: Myrna Adine Anes, MD;  Location: [facility];  Service: Ophthalmology;  Laterality: Left;  Diabetic   CHOLECYSTECTOMY     COLONOSCOPY     COLONOSCOPY WITH PROPOFOL  N/A 11/29/2017   Procedure: COLONOSCOPY WITH PROPOFOL ;  Surgeon: Gaylyn Gladis PENNER, MD;  Location: [facility];  Service: Endoscopy;  Laterality: N/A;   CORONARY ANGIOPLASTY     PTCA and stent of RCA  CORONARY ARTERY BYPASS GRAFT     ESOPHAGOGASTRODUODENOSCOPY     ESOPHAGOGASTRODUODENOSCOPY (EGD) WITH PROPOFOL  N/A 11/29/2017   Procedure: ESOPHAGOGASTRODUODENOSCOPY (EGD) WITH PROPOFOL ;  Surgeon: Gaylyn Gladis PENNER, MD;  Location: [facility] ENDOSCOPY;  Service: Endoscopy;  Laterality: N/A;   ESOPHAGOGASTRODUODENOSCOPY (EGD) WITH PROPOFOL  N/A 03/07/2018   Procedure: ESOPHAGOGASTRODUODENOSCOPY (EGD) WITH PROPOFOL ;  Surgeon: Gaylyn Gladis PENNER, MD;  Location: [facility] END[facility];  Service: Endoscopy;  Laterality: N/A;   ESOPHAGOGASTRODUODENOSCOPY (EGD) WITH PROPOFOL  N/A 08/16/2019   Procedure:  ESOPHAGOGASTRODUODENOSCOPY (EGD) WITH PROPOFOL ;  Surgeon: Toledo, Ladell POUR, MD;  Location: [facility] ENDOSCOPY;  Service: Gastroenterology;  Laterality: N/A;   EXCISION OF ADNEXAL MASS     JOINT REPLACEMENT  04/2021   left total knee replacement   OOPHORECTOMY     TOTAL KNEE REVISION Left 05/07/2021   Procedure: TOTAL KNEE REVISION;  Surgeon: Mardee Lynwood SQUIBB, MD;  Location: [facility] ORS;  Service: Orthopedics;  Laterality: Left;   vesicular vaginal fistula       Allergies[1]  Family History  Problem Relation Age of Onset   Hypertension Mother    Heart attack Mother    Hypertension Father    Heart attack Father    Skin cancer Father    Heart attack Sister    Breast cancer Neg Hx     Prior to Admission medications  Medication Sig Start Date End Date Taking? Authorizing Provider  acetaminophen  (TYLENOL ) 500 MG tablet Take 1 tablet (500 mg total) by mouth every 6 (six) hours as needed. 06/04/23   Janit Kast, PA-C  albuterol  (VENTOLIN  HFA) 108 (90 Base) MCG/ACT inhaler Inhale 2 puffs into the lungs every 4 (four) hours as needed for wheezing. 12/24/23 12/23/24  [provider]  alum & mag hydroxide-simeth (MAALOX/MYLANTA) 200-200-20 MG/5ML suspension Take 30 mLs by mouth every 4 (four) hours as needed for indigestion or heartburn. 09/22/22   Milissa Tod PARAS, MD  ascorbic acid  (VITAMIN C ) 500 MG tablet Take 1 tablet (500 mg total) by mouth daily. 09/22/22   Milissa Tod PARAS, MD  cholecalciferol  (VITAMIN D3) 25 MCG (1000 UNIT) tablet Take 1,000 Units by mouth daily.    [provider]  Cyanocobalamin  (B-12) 2500 MCG TABS Take 2,500 mcg by mouth daily.    [provider]  diltiazem  (CARDIZEM  CD) 120 MG 24 hr capsule Take 1 capsule (120 mg total) by mouth daily. Patient not taking: Reported on 01/19/2024 10/15/21   Laurita Pillion, MD  fenofibrate  160 MG tablet Take 160 mg by mouth daily.    [provider]  Ferrous Sulfate  (IRON ) 325 (65 Fe) MG TABS Take 1 tablet (325  mg total) by mouth daily. 04/27/23   Babara Call, MD  gabapentin  (NEURONTIN ) 100 MG capsule Take 1 capsule by mouth 3 (three) times daily. 02/19/22   [provider]  hydrOXYzine  (ATARAX ) 25 MG tablet Take 25 mg by mouth daily as needed for itching.    [provider]  insulin  isophane & regular human KwikPen (NOVOLIN  70/30 KWIKPEN) (70-30) 100 UNIT/ML KwikPen Inject 20-25 Units into the skin daily with breakfast.    [provider]  insulin  regular (NOVOLIN  R) 100 units/mL injection Inject 1-10 Units into the skin daily. lunchtime    [provider]  lisinopril  (ZESTRIL ) 5 MG tablet Take 1 tablet (5 mg total) by mouth daily. Hold this medication until you see nephro 11/05/22   Trudy Anthony HERO, MD  loratadine  (CLARITIN ) 10 MG tablet Take 10 mg by mouth daily.    [provider]  Multiple Vitamin (MULTIVITAMIN WITH MINERALS) TABS tablet Take 1 tablet by mouth at bedtime.    [provider]  nitroGLYCERIN  (NITROSTAT ) 0.4 MG SL tablet Place 0.4 mg under the tongue every 5 (five) minutes as needed for chest pain.    [provider]  ondansetron  (ZOFRAN ) 4 MG tablet Take 4 mg by mouth every 8 (eight) hours as needed for vomiting or nausea. 09/19/20   [provider]  pantoprazole  (PROTONIX ) 40 MG tablet Take 40 mg by mouth 2 (two) times daily.    [provider]  polyethylene glycol powder (GLYCOLAX /MIRALAX ) 17 GM/SCOOP powder Take 17 g by mouth 2 (two) times daily. Patient taking differently: Take 17 g by mouth 2 (two) times daily as needed for mild constipation. 09/22/22   Milissa Tod PARAS, MD  propranolol  ER (INDERAL  LA) 60 MG 24 hr capsule Take 1 capsule (60 mg total) by mouth daily. 10/14/21 01/19/24  Laurita Pillion, MD  senna (SENOKOT) 8.6 MG tablet Take 1 tablet by mouth daily as needed.    [provider]  sertraline  (ZOLOFT ) 100 MG tablet Take 100 mg by mouth daily.    [provider]  torsemide  (DEMADEX )  100 MG tablet Take 100 mg by mouth daily.    [provider]    Social History:  reports that she quit smoking about 53 years ago. Her smoking use included cigarettes. She has never used smokeless tobacco. She reports that she does not currently use alcohol. She reports that she does not use drugs.    Physical Exam: Vitals:   05/13/24 0912 05/13/24 1215 05/13/24 1230 05/13/24 1336  BP: (!) 138/58 (!) 109/46 123/61   Pulse: 63 69 88   Resp: 18  17   Temp: 98.4 F (36.9 C)   98 F (36.7 C)  TempSrc: Oral   Oral  SpO2: 98% 100% 100%     Gen: NAD HENT: NCAT CV: normal heart sounds Lung: CTAB Abd: No TTP of abdomen, normal bowel sounds MSK: No asymmetry, good bulk and tone Neuro: alert and oriented   Labs on Admission: I have personally reviewed following labs and imaging studies  CBC: Recent Labs  Lab 05/13/24 0914  WBC 7.9  HGB 11.6*  HCT 34.9*  MCV 80.2  PLT 191   Basic Metabolic Panel: Recent Labs  Lab 05/13/24 0914 05/13/24 1349  NA 122* 125*  K 5.3* 4.8  CL 81* 85*  CO2 30 29  GLUCOSE 575* 231*  BUN 68* 69*  CREATININE 2.01* 1.85*  CALCIUM  10.1 10.4*   GFR: CrCl cannot be calculated (Unknown ideal weight.). Liver Function Tests: Recent Labs  Lab 05/13/24 0914  AST 15  ALT 6  ALKPHOS 138*  BILITOT 0.5  PROT 7.8  ALBUMIN 3.7   Recent Labs  Lab 05/13/24 0914  LIPASE 53*   No results for input(s): AMMONIA in the last 168 hours. Coagulation Profile: No results for input(s): INR, PROTIME in the last 168 hours. Cardiac Enzymes: No results for input(s): CKTOTAL, CKMB, CKMBINDEX, TROPONINI, TROPONINIHS in the last 168 hours. BNP (last 3 results) No results for input(s): BNP in the last 8760 hours. HbA1C: No results for input(s): HGBA1C in the last 72 hours. CBG: Recent Labs  Lab 05/13/24 1156 05/13/24 1332  GLUCAP 577* 291*   Lipid Profile: No results for input(s): CHOL, HDL, LDLCALC, TRIG,  CHOLHDL, LDLDIRECT in the last 72 hours. Thyroid  Function Tests: No results for input(s): TSH, T4TOTAL, FREET4, T3FREE, THYROIDAB in the last 72 hours.  Anemia Panel: No results for input(s): VITAMINB12, FOLATE, FERRITIN, TIBC, IRON , RETICCTPCT in the last 72 hours. Urine analysis:    Component Value Date/Time   COLORURINE YELLOW (A) 02/24/2024 2042   APPEARANCEUR CLEAR (A) 02/24/2024 2042   LABSPEC 1.003 (L) 02/24/2024 2042   PHURINE 5.0 02/24/2024 2042   GLUCOSEU >=500 (A) 02/24/2024 2042   HGBUR NEGATIVE 02/24/2024 2042   BILIRUBINUR NEGATIVE 02/24/2024 2042   KETONESUR NEGATIVE 02/24/2024 2042   PROTEINUR NEGATIVE 02/24/2024 2042   NITRITE NEGATIVE 02/24/2024 2042   LEUKOCYTESUR NEGATIVE 02/24/2024 2042    Radiological Exams on Admission: I have personally reviewed images CT ABDOMEN PELVIS WO CONTRAST Result Date: 05/12/2024 CLINICAL DATA:  Nausea.  Melena.  Abnormal weight loss. EXAM: CT ABDOMEN AND PELVIS WITHOUT CONTRAST TECHNIQUE: Multidetector CT imaging of the abdomen and pelvis was performed following the standard protocol without IV contrast. RADIATION DOSE REDUCTION: This exam was performed according to the departmental dose-optimization program which includes automated exposure control, adjustment of the mA and/or kV according to patient size and/or use of iterative reconstruction technique. COMPARISON:  CT scan abdomen and pelvis from 01/18/2024. FINDINGS: Lower chest: The lung bases are clear. No pleural effusion. The heart is normal in size. No pericardial effusion. Hepatobiliary: The liver is normal in size. Non-cirrhotic configuration. No suspicious mass. No intrahepatic or extrahepatic bile duct dilation. Gallbladder is surgically absent. Pancreas: Unremarkable. No pancreatic ductal dilatation or surrounding inflammatory changes. Spleen: Within normal limits. No focal lesion. Adrenals/Urinary Tract: Unremarkable right adrenal gland. There is a stable  well-circumscribed left adrenal nodule with central dystrophic calcifications, favored to represent an adenoma. No suspicious renal mass within the limitations of this unenhanced exam. No nephroureterolithiasis or obstructive uropathy. Unremarkable urinary bladder. Stomach/Bowel: No disproportionate dilation of the small or large bowel loops. No evidence of abnormal bowel wall thickening or inflammatory changes. The appendix was not visualized; however there is no acute inflammatory process in the right lower quadrant. Vascular/Lymphatic: Redemonstration of diffuse fat stranding mainly in the central pelvis, which is essentially similar to the prior study. There is resolution of previously noted ascites. No abscess or loculated collection. No pneumoperitoneum. No abdominal or pelvic lymphadenopathy, by size criteria. No aneurysmal dilation of the major abdominal arteries. There are moderate peripheral atherosclerotic vascular calcifications of the aorta and its major branches. Reproductive: The uterus is surgically absent. No large adnexal mass. Other: There is small fat containing periumbilical hernia. There are soft tissue density areas in the anterior abdominal wall subcutaneous tissue, most likely due to medication injections. Musculoskeletal: No suspicious osseous lesions. There are moderate - marked multilevel degenerative changes in the visualized spine. Redemonstration of mild compression deformities of T11 and L3 vertebrae, similar to the prior study. No significant retropulsion or spinal canal compromise. IMPRESSION: 1. No acute inflammatory process identified within the abdomen or pelvis. 2. Redemonstration of diffuse fat stranding mainly in the central pelvis, which is essentially similar to the prior study. There is resolution of previously noted ascites. No abscess or loculated collection. No pneumoperitoneum. 3. Multiple other nonacute observations, as described above. Aortic Atherosclerosis  (ICD10-I70.0). Electronically Signed   By: Ree Molt M.D.   On: 05/12/2024 13:06    EKG: My personal interpretation of EKG shows: pending    Assessment/Plan Principal Problem:   Failure to thrive in adult Active Problems:   Iron  deficiency anemia   Uncontrolled type 2 diabetes mellitus with hyperglycemia, with long-term current use of insulin  (HCC)   Essential hypertension   Liver cirrhosis secondary  to NASH (nonalcoholic steatohepatitis) (HCC)   Chronic kidney disease, stage 4 (severe) (HCC)   DDD (degenerative disc disease), lumbosacral   Hyperlipidemia due to type 2 diabetes mellitus (HCC)   OSA on CPAP   Chronic diastolic CHF (congestive heart failure) (HCC)   GERD without esophagitis   Patient with multiple complaints including longstanding GI symptoms including symptoms of nausea and poor oral intake along with recent weight loss of 30 pounds admitted for failure to thrive. These symptoms were reported by her caregiver as pt states she is feeling fine. Discussed with caregiver and she states she has cognitive decline.  She has multiple electrolyte abnormalities including significant AKI, hyponatremia, and uncontrolled diabetes.  Her symptoms may be secondary to gastroparesis in the setting of her uncontrolled diabetes.  Given the above abnormalities, patient will be admitted to progressive unit.  Will continue IV fluids.  Will consult gastroenterology regarding her symptoms as gastroparesis is high on the differential.  She is supposed to see GI in the outpatient setting given her cirrhosis but appears to have been lost to follow-up.  Hyponatremia: Usually multifactorial in the setting of severe hyperglycemia and poor oral intake.  Initially was 122 but with correction of her glucose, it has improved to 125.  Will get urine studies.  Will start low rate IVF after urine studies have been collected.    AKI: Likely prerenal in the setting of poor oral intake.  Will monitor with IVF.   Avoid nephrotoxic agents.  Renally dose medications.  Cirrhosis: at baseline.  Will await correction of metabolic abnormalities prior to calculating MELD score.  She does need outpatient GI follow-up for [facility] surveillance and EGD.  T2DM: Severe hyperglycemia on presentation.  Not in DKA.  Will place on SSI given consistent p.o. intake.  Hypertension: Holding home medicine given AKI.  Will continue to monitor blood pressure and can use IV medicines as needed.  GERD: Continue home PPI.  CHF: Holding home diuretic given significant hyponatremia.  Iron  deficiency anemia: holding her iron  supplementation currently.  If no other cause for her GI symptoms, iron  supplementation can be considered as an etiology.  She follows with oncology for this.  Cognitive Impairment: Seen by neurology. On aricept. Will continue.   MDD: continue home zoloft .   OSA: per caregiver not using CPAP. Will continue here given PCP notes.   VTE prophylaxis:  SQ Heparin   Diet: CLD Code Status:  Full Code Telemetry:  Admission status: Observation, Progressive Patient is from: Home Anticipated d/c is to: Home Anticipated d/c is in: 1-2 days   Family Communication: Press Photographer requesting daily update.   Consults called: GI   Severity of Illness: The appropriate patient status for this patient is OBSERVATION. Observation status is judged to be reasonable and necessary in order to provide the required intensity of service to ensure the patient's safety. The patient's presenting symptoms, physical exam findings, and initial radiographic and laboratory data in the context of their medical condition is felt to place them at decreased risk for further clinical deterioration. Furthermore, it is anticipated that the patient will be medically stable for discharge from the hospital within 2 midnights of admission.    Morene Bathe, MD Jolynn DEL. [facility]      [1]  Allergies Allergen Reactions    Levaquin [Levofloxacin In D5w] Shortness Of Breath   Penicillins Hives   Codeine Other (See Comments)    Hallucinate   Lipitor [Atorvastatin Calcium ] Other (See Comments)    Muscle  pain   Lopressor  [Metoprolol  Tartrate] Other (See Comments)    Heart races   Potassium-Containing Compounds Other (See Comments)     Facial flushing   Procardia [Nifedipine] Other (See Comments)    Heart races   Sucralfate Nausea Only   Tramadol Other (See Comments)    Confusion   Allegra [Fexofenadine] Rash   Naltrexone Other (See Comments)    Severe headache, nausea, body flashes.    Sulfa Antibiotics Rash   "

## 2024-05-13 NOTE — ED Notes (Addendum)
 Pt's caregiver Addie Bertrand [phone] would like to be contacted if pt will be admitted.

## 2024-05-14 DIAGNOSIS — R627 Adult failure to thrive: Secondary | ICD-10-CM | POA: Diagnosis not present

## 2024-05-14 LAB — BASIC METABOLIC PANEL WITH GFR
Anion gap: 10 (ref 5–15)
BUN: 49 mg/dL — ABNORMAL HIGH (ref 8–23)
CO2: 28 mmol/L (ref 22–32)
Calcium: 9.8 mg/dL (ref 8.9–10.3)
Chloride: 94 mmol/L — ABNORMAL LOW (ref 98–111)
Creatinine, Ser: 1.34 mg/dL — ABNORMAL HIGH (ref 0.44–1.00)
GFR, Estimated: 40 mL/min — ABNORMAL LOW
Glucose, Bld: 241 mg/dL — ABNORMAL HIGH (ref 70–99)
Potassium: 5.3 mmol/L — ABNORMAL HIGH (ref 3.5–5.1)
Sodium: 132 mmol/L — ABNORMAL LOW (ref 135–145)

## 2024-05-14 LAB — CBC
HCT: 31.7 % — ABNORMAL LOW (ref 36.0–46.0)
Hemoglobin: 10.5 g/dL — ABNORMAL LOW (ref 12.0–15.0)
MCH: 26.9 pg (ref 26.0–34.0)
MCHC: 33.1 g/dL (ref 30.0–36.0)
MCV: 81.1 fL (ref 80.0–100.0)
Platelets: 152 K/uL (ref 150–400)
RBC: 3.91 MIL/uL (ref 3.87–5.11)
RDW: 15.2 % (ref 11.5–15.5)
WBC: 6 K/uL (ref 4.0–10.5)
nRBC: 0 % (ref 0.0–0.2)

## 2024-05-14 LAB — SODIUM
Sodium: 128 mmol/L — ABNORMAL LOW (ref 135–145)
Sodium: 128 mmol/L — ABNORMAL LOW (ref 135–145)

## 2024-05-14 LAB — GLUCOSE, CAPILLARY
Glucose-Capillary: 256 mg/dL — ABNORMAL HIGH (ref 70–99)
Glucose-Capillary: 366 mg/dL — ABNORMAL HIGH (ref 70–99)
Glucose-Capillary: 180 mg/dL — ABNORMAL HIGH (ref 70–99)
Glucose-Capillary: 148 mg/dL — ABNORMAL HIGH (ref 70–99)

## 2024-05-14 LAB — CORTISOL-AM, BLOOD: Cortisol - AM: 13.1 ug/dL (ref 6.7–22.6)

## 2024-05-14 LAB — HEMOGLOBIN A1C
Hgb A1c MFr Bld: 12.2 % — ABNORMAL HIGH (ref 4.8–5.6)
Mean Plasma Glucose: 303.44 mg/dL

## 2024-05-14 LAB — SODIUM, URINE, RANDOM: Sodium, Ur: 50 mmol/L

## 2024-05-14 MED ORDER — ORAL CARE MOUTH RINSE: 15.0000 mL | OROMUCOSAL | Status: DC | PRN

## 2024-05-14 MED ORDER — SODIUM CHLORIDE 0.9 % IV SOLN: INTRAVENOUS | Status: DC

## 2024-05-14 MED ORDER — INSULIN GLARGINE 100 UNIT/ML ~~LOC~~ SOLN
5.0000 [IU] | Freq: Every day | SUBCUTANEOUS | Status: DC
  Administered 2024-05-14 – 2024-05-15 (×2): 5 [IU] via SUBCUTANEOUS
  Filled 2024-05-14 (×3): qty 0.05

## 2024-05-14 NOTE — Plan of Care (Signed)

## 2024-05-14 NOTE — Consult Note (Signed)
 "  Rhonda JONELLE Brooklyn, MD [address]  [place], [place] [address]  Main: [phone] Fax:  [phone] Pager: [phone]   Consultation  Referring Provider:     No ref. provider found Primary Care Physician:  Lenon Layman ORN, MD Primary Gastroenterologist:   [facility]         Reason for Consultation: Failure to thrive, nausea, vomiting  Date of Admission:  05/13/2024 Date of Consultation:  05/14/2024         HPI:   Rhonda Tyler is a 84 y.o. female with poorly controlled diabetes, chronic iron  deficiency anemia, chronic kidney disease, known history of bleeding from gastric polyps, s/p resection in the past presented with 1 month history of abdominal pain, nausea, vomiting, dark stools as well as weight loss of 30 pounds within last 2 months.  She is currently being treated for upper respiratory infection with azithromycin .  She underwent CT abdomen and pelvis yesterday which did not reveal any acute intra-abdominal pathology.  Patient is hemodynamically stable in the ER, labs reveal normocytic anemia, likely hemoconcentration, hyponatremia and mild hyperkalemia, AKI.  Patient is admitted for further evaluation.  GI is consulted for failure to thrive   NSAIDs: None  Antiplts/Anticoagulants/Anti thrombotics: None  GI Procedures:  Upper endoscopy 08/16/2019 - Multiple gastric polyps. Biopsied. - Normal examined duodenum. - The examination was otherwise normal.  Upper endoscopy 03/07/2018 - Normal examined duodenum. - Many gastric polyps. Resected and retrieved. - Normal esophagus.  Upper endoscopy and colonoscopy 11/29/2017 - Normal esophagus. - Multiple gastric polyps. The largest with stigmata of bleeding resected and retrieved. Clip ( MR conditional) was placed. - Normal examined duodenum.  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   Colon polyp    Coronary artery disease    Cutaneous sarcoidosis (HCC)    Diabetes mellitus without complication (HCC)    Pt takes insulin    Diabetic neuropathy (HCC)    GERD (gastroesophageal reflux disease)    Headache    migaines in the past   Hemorrhoid    History of diabetic neuropathy    Hypercalcemia    Hyperlipemia    Hypertension    IBS (irritable bowel syndrome)    Iron  deficiency anemia    Myocardial infarction (HCC)    Pneumonia    Sleep apnea    NO CPAP    Past Surgical History:  Procedure Laterality Date   ABDOMINAL HYSTERECTOMY     arthroscopic rotator cuff repair     BACK SURGERY     CARDIAC CATHETERIZATION     CATARACT EXTRACTION W/PHACO Right 01/26/2022   Procedure: CATARACT EXTRACTION PHACO AND INTRAOCULAR LENS PLACEMENT (IOC) RIGHT DIABETIC;  Surgeon: Myrna Adine Anes, MD;  Location: [facility];  Service: Ophthalmology;  Laterality: Right;  2.90 0:29.3   CATARACT EXTRACTION W/PHACO Left 02/09/2022   Procedure: CATARACT EXTRACTION PHACO AND INTRAOCULAR LENS PLACEMENT (IOC) LEFT DIABETIC 3.54 00:32.3;  Surgeon: Myrna Adine Anes, MD;  Location: [facility];  Service: Ophthalmology;  Laterality: Left;  Diabetic   CHOLECYSTECTOMY     COLONOSCOPY     COLONOSCOPY WITH PROPOFOL  N/A 11/29/2017   Procedure: COLONOSCOPY WITH PROPOFOL ;  Surgeon: Gaylyn Gladis PENNER, MD;  Location: [facility];  Service: Endoscopy;  Laterality: N/A;   CORONARY ANGIOPLASTY     PTCA and stent of RCA   CORONARY ARTERY BYPASS GRAFT     ESOPHAGOGASTRODUODENOSCOPY     ESOPHAGOGASTRODUODENOSCOPY (EGD) WITH PROPOFOL  N/A 11/29/2017   Procedure: ESOPHAGOGASTRODUODENOSCOPY (EGD) WITH PROPOFOL ;  Surgeon: Gaylyn Gladis PENNER, MD;  Location: [facility];  Service: Endoscopy;  Laterality: N/A;   ESOPHAGOGASTRODUODENOSCOPY (EGD) WITH PROPOFOL  N/A 03/07/2018   Procedure:  ESOPHAGOGASTRODUODENOSCOPY (EGD) WITH PROPOFOL ;  Surgeon: Gaylyn Gladis PENNER, MD;  Location: [facility];  Service: Endoscopy;  Laterality: N/A;   ESOPHAGOGASTRODUODENOSCOPY (EGD) WITH PROPOFOL  N/A 08/16/2019   Procedure: ESOPHAGOGASTRODUODENOSCOPY (EGD) WITH PROPOFOL ;  Surgeon: Toledo, Ladell POUR, MD;  Location: [facility];  Service: Gastroenterology;  Laterality: N/A;   EXCISION OF ADNEXAL MASS     JOINT REPLACEMENT  04/2021   left total knee replacement   OOPHORECTOMY     TOTAL KNEE REVISION Left 05/07/2021   Procedure: TOTAL KNEE REVISION;  Surgeon: Mardee Lynwood SQUIBB, MD;  Location: [facility];  Service: Orthopedics;  Laterality: Left;   vesicular vaginal fistula      Prior to Admission medications  Medication Sig Start Date End Date Taking? Authorizing Provider  acetaminophen  (TYLENOL ) 500 MG tablet Take 1 tablet (500 mg total) by mouth every 6 (six) hours as needed. 06/04/23  Yes Evans, Alexandra, PA-C  albuterol  (VENTOLIN  HFA) 108 (90 Base) MCG/ACT inhaler Inhale 2 puffs into the lungs every 4 (four) hours as needed for wheezing. 12/24/23 12/23/24 Yes [provider]  azithromycin  (ZITHROMAX ) 250 MG tablet Take 250 mg by mouth once a week. 05/12/24 05/17/24 Yes [provider]  diltiazem  (CARDIZEM  CD) 120 MG 24 hr capsule Take 1 capsule (120 mg total) by mouth daily. 10/15/21  Yes Laurita Pillion, MD  fenofibrate  160 MG tablet Take 160 mg by mouth daily.   Yes [provider]  Ferrous Sulfate  (IRON ) 325 (65 Fe) MG TABS Take 1 tablet (325 mg total) by mouth daily. 04/27/23  Yes Babara Call, MD  gabapentin  (NEURONTIN ) 100 MG capsule Take 1 capsule by mouth 3 (three) times daily. 02/19/22  Yes [provider]  hydrOXYzine  (ATARAX ) 25 MG tablet Take 25 mg by mouth daily as needed for itching.   Yes [provider]  insulin  isophane & regular human KwikPen (NOVOLIN  70/30 KWIKPEN) (70-30) 100 UNIT/ML KwikPen Inject 20-25 Units into the skin daily with breakfast.    Yes [provider]  insulin  regular (NOVOLIN  R) 100 units/mL injection Inject 1-10 Units into the skin daily. lunchtime   Yes [provider]  lisinopril  (ZESTRIL ) 5 MG tablet Take 1 tablet (5 mg total) by mouth daily. Hold this medication until you see nephro 11/05/22  Yes Trudy Anthony HERO, MD  loratadine  (CLARITIN ) 10 MG tablet Take 10 mg by mouth daily.   Yes [provider]  nitroGLYCERIN  (NITROSTAT ) 0.4 MG SL tablet Place 0.4 mg under the tongue every 5 (five) minutes as needed for chest pain.   Yes [provider]  ondansetron  (ZOFRAN ) 4 MG tablet Take 4 mg by mouth every 8 (eight) hours as needed for vomiting or nausea. 09/19/20  Yes [provider]  pantoprazole  (PROTONIX ) 40 MG tablet Take 40 mg by mouth 2 (two) times daily.   Yes [provider]  polyethylene glycol powder (GLYCOLAX /MIRALAX ) 17 GM/SCOOP powder Take 17 g by mouth 2 (two) times daily. Patient taking differently: Take 17 g by mouth 2 (two) times daily as needed for mild constipation. 09/22/22  Yes Milissa Tod PARAS, MD  propranolol  ER (INDERAL  LA) 60 MG 24 hr capsule Take 1 capsule (60 mg total) by mouth daily. 10/14/21 05/13/24 Yes Zhang, Dekui, MD  senna (SENOKOT) 8.6 MG tablet Take 1 tablet by mouth daily as needed.   Yes [provider]  sertraline  (ZOLOFT ) 100 MG tablet Take 100 mg by mouth daily.   Yes [provider]  spironolactone  (ALDACTONE ) 50 MG tablet Take 50 mg by mouth daily.   Yes [provider]  torsemide  (DEMADEX ) 100 MG tablet Take 100 mg by mouth daily.   Yes [provider]  alum & mag hydroxide-simeth (MAALOX/MYLANTA) 200-200-20 MG/5ML suspension Take 30 mLs by mouth every 4 (four) hours as needed for indigestion or heartburn. 09/22/22   Milissa Tod PARAS, MD  ascorbic acid  (VITAMIN C ) 500 MG tablet Take 1 tablet (500 mg total) by mouth daily. 09/22/22   Milissa Tod PARAS, MD  cholecalciferol  (VITAMIN D3) 25 MCG (1000 UNIT)  tablet Take 1,000 Units by mouth daily.    [provider]  Cyanocobalamin  (B-12) 2500 MCG TABS Take 2,500 mcg by mouth daily.    [provider]  Multiple Vitamin (MULTIVITAMIN WITH MINERALS) TABS tablet Take 1 tablet by mouth at bedtime.    [provider]    Family History  Problem Relation Age of Onset   Hypertension Mother    Heart attack Mother    Hypertension Father    Heart attack Father    Skin cancer Father    Heart attack Sister    Breast cancer Neg Hx      Social History[1]  Allergies as of 05/13/2024 - Review Complete 05/13/2024  Allergen Reaction Noted   Levaquin [levofloxacin in d5w] Shortness Of Breath    Penicillins Hives 03/04/2018   Codeine Other (See Comments) 11/26/2017   Lipitor [atorvastatin calcium ] Other (See Comments) 11/26/2017   Lopressor  [metoprolol  tartrate] Other (See Comments) 11/29/2017   Potassium-containing compounds Other (See Comments)    Procardia [nifedipine] Other (See Comments) 11/26/2017   Sucralfate Nausea Only 11/26/2017   Tramadol Other (See Comments) 11/26/2017   Allegra [fexofenadine] Rash 11/26/2017   Naltrexone Other (See Comments) 12/13/2020   Sulfa antibiotics Rash 11/26/2017    Review of Systems:    All systems reviewed and negative except where noted in HPI.   Physical Exam:  Vital signs in last 24 hours: Temp:  [97.6 F (36.4 C)-98.8 F (37.1 C)] 97.8 F (36.6 C) (01/04 1341) Pulse Rate:  [69-77] 70 (01/04 1341) Resp:  [13-18] 18 (01/04 1341) BP: (108-148)/(51-92) 127/58 (01/04 1341) SpO2:  [99 %-100 %] 100 % (01/04 1341) FiO2 (%):  [21 %] 21 % (01/03 2300) Weight:  [64.8 kg] 64.8 kg (01/03 2020) Last BM Date : 05/14/24 General:   Alert,  Well-developed, well-nourished, pleasant and cooperative in NAD Eyes:  Sclera clear, no icterus.   Conjunctiva pink. Lungs:  Respirations even and unlabored.  Clear throughout to auscultation.   No wheezes, crackles, or rhonchi. No acute  distress. Heart:  Regular rate and rhythm; no murmurs, clicks, rubs, or gallops. Abdomen:  Normal bowel sounds. Soft, non-tender and non-distended without masses, hepatosplenomegaly or hernias noted.  No guarding or rebound tenderness.   Rectal: Not performed Extremities:  No clubbing or edema.  No cyanosis. Neurologic:  Alert and oriented x3 Skin:  Intact without significant lesions or rashes. No jaundice. Psych:  Alert and cooperative. Normal mood and affect.  LAB RESULTS:    Latest Ref Rng & Units  05/14/2024    4:54 AM 05/13/2024    9:14 AM 02/24/2024    6:20 PM  CBC  WBC 4.0 - 10.5 K/uL 6.0  7.9  4.4   Hemoglobin 12.0 - 15.0 g/dL 89.4  88.3  7.7   Hematocrit 36.0 - 46.0 % 31.7  34.9  25.9   Platelets 150 - 400 K/uL 152  191  145     BMET    Latest Ref Rng & Units 05/14/2024   10:54 AM 05/14/2024    4:54 AM 05/13/2024   11:36 PM  BMP  Glucose 70 - 99 mg/dL  758    BUN 8 - 23 mg/dL  49    Creatinine 9.55 - 1.00 mg/dL  8.65    Sodium 864 - 854 mmol/L 128  132  128   Potassium 3.5 - 5.1 mmol/L  5.3    Chloride 98 - 111 mmol/L  94    CO2 22 - 32 mmol/L  28    Calcium  8.9 - 10.3 mg/dL  9.8      LFT    Latest Ref Rng & Units 05/13/2024    9:14 AM 02/24/2024    6:20 PM 01/18/2024    5:56 PM  Hepatic Function  Total Protein 6.5 - 8.1 g/dL 7.8  6.8  6.9   Albumin 3.5 - 5.0 g/dL 3.7  3.0  3.3   AST 15 - 41 U/L 15  29  25    ALT 0 - 44 U/L 6  11  12    Alk Phosphatase 38 - 126 U/L 138  99  120   Total Bilirubin 0.0 - 1.2 mg/dL 0.5  2.5  1.4      STUDIES: No results found.    Impression / Plan:   ELON LOMELI is a 84 y.o. female with poorly controlled diabetes, CKD, known history of multiple gastric polyps, status postresection, etiology of chronic iron  deficiency anemia is admitted with unintentional weight loss, nausea, vomiting and dark stools for last 1 to 2 months  Failure to thrive, nausea, vomiting with dark stools, AKI Elevated BUN/creatinine ratio Hemoglobin elevated  compared to baseline, partially secondary to hemoconcentration given AKI With hyponatremia and hyperkalemia, recommend to check a.m. cortisol levels Serum sodium levels have improved since admission Recommend upper endoscopy for further evaluation, possible colonoscopy since her last colonoscopy was in 2019, and video capsule endoscopy based on EGD findings Monitor CBC closely N.p.o. effective 5 AM tomorrow Okay with clear liquid diet today Scheduled antiemetics, if needed try Reglan  5 mg 3 times daily before each meal short-term Follow-up with GI as outpatient  Of note, patient does not have cirrhosis of liver, has chronic mild thrombocytopenia No evidence of splenomegaly or portal hypertension Follow-up with hematology for workup of thrombocytopenia  Poorly controlled diabetes HbA1c 12, on insulin  Patient has not seen endocrinologist within last 1 year  Thank you for involving me in the care of this patient.  Dr. Jinny will cover from tomorrow    LOS: 0 days   Rhonda Brooklyn, MD  05/14/2024, 2:11 PM    Note: This dictation was prepared with Dragon dictation along with smaller phrase technology. Any transcriptional errors that result from this process are unintentional.      [1]  Social History Tobacco Use   Smoking status: Former    Current packs/day: 0.00    Types: Cigarettes    Quit date: 12/01/1970    Years since quitting: 53.4   Smokeless tobacco: Never  Vaping Use  Vaping status: Never Used  Substance Use Topics   Alcohol use: Not Currently   Drug use: Never   "

## 2024-05-14 NOTE — Progress Notes (Signed)
 OT Cancellation Note  Patient Details Name: Rhonda Tyler MRN: [id number] DOB: [birth year]   Cancelled Treatment:    Reason Eval/Treat Not Completed: OT screened, no needs identified, will sign off. Pt ambulated in hallway using RW with PT/OT with good safety and stability. No acute OT needs identified, will sign off in house with pt in agreement.   Jaiya Mooradian E Chrismon 05/14/2024, 9:48 AM

## 2024-05-14 NOTE — Evaluation (Signed)
 Physical Therapy Evaluation Patient Details Name: Rhonda Tyler MRN: [id number] DOB: 12/14/1940 Today's Date: 05/14/2024  History of Present Illness  Patient is a 84 year old female with failure to thrive. History of longstanding GI symptoms with nausea, poor oral intake, weight loss of 30 pounds. 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  Clinical Impression  Patient is agreeable to PT evaluation. She reports she has caregiver assistance at home for driving, housework, but can complete ADLs without assistance. She has a rolling walker that she uses if needed at home for ambulation.  Today the patient required only supervision for mobility. She walked a lap in the hallway and in the room using rolling walker slowly but with no loss of balance. Encourage patient to use rolling walker at this time for safety and fall prevention. She reports feeling mild generalized weakness compared to her baseline. PT will follow up to maximize independence and facilitate return to prior level of function. Patient is hopeful for discharge home soon.       If plan is discharge home, recommend the following: Assistance with cooking/housework;Assist for transportation;Help with stairs or ramp for entrance   Can travel by private vehicle        Equipment Recommendations None recommended by PT  Recommendations for Other Services       Functional Status Assessment Patient has had a recent decline in their functional status and demonstrates the ability to make significant improvements in function in a reasonable and predictable amount of time.     Precautions / Restrictions Precautions Precautions: Fall Recall of Precautions/Restrictions: Intact Restrictions Weight Bearing Restrictions Per Provider Order: No      Mobility  Bed Mobility Overal bed mobility: Needs Assistance Bed Mobility: Supine to Sit, Sit to Supine     Supine to sit: Supervision Sit to supine:  Supervision   General bed mobility comments: increased time    Transfers Overall transfer level: Needs assistance Equipment used: Rolling walker (2 wheels) Transfers: Sit to/from Stand Sit to Stand: Supervision           General transfer comment: no physical assistance required for standing    Ambulation/Gait Ambulation/Gait assistance: Supervision Gait Distance (Feet): 170 Feet Assistive device: Rolling walker (2 wheels) Gait Pattern/deviations: Decreased stride length, Trunk flexed Gait velocity: decreased     General Gait Details: slow but steady with ambulation. patient reports feeling generalized weakness compared to baseline. heart rate 81-82 bpm after activity. encourage rolling walker at this time for safety and fall prevention  Stairs            Wheelchair Mobility     Tilt Bed    Modified Rankin (Stroke Patients Only)       Balance Overall balance assessment: Mild deficits observed, not formally tested                                           Pertinent Vitals/Pain Pain Assessment Pain Assessment: No/denies pain    Home Living Family/patient expects to be discharged to:: Private residence Living Arrangements: Alone Available Help at Discharge: Personal care attendant;Available PRN/intermittently;Neighbor Type of Home: House Home Access: Stairs to enter Entrance Stairs-Rails: None Entrance Stairs-Number of Steps: 1 clearance   Home Layout: One level Home Equipment: Agricultural Consultant (2 wheels);Cane - single point Additional Comments: PCA comes 5 days a week    Prior  Function Prior Level of Function : Needs assist;Independent/Modified Independent       Physical Assist : ADLs (physical)   ADLs (physical): IADLs Mobility Comments: PRN use of rolling walker, otherwise no device ADLs Comments: caregiver assist with housework, cooking, cleaning, driving     Extremity/Trunk Assessment   Upper Extremity Assessment Upper  Extremity Assessment: Overall WFL for tasks assessed    Lower Extremity Assessment Lower Extremity Assessment: Generalized weakness       Communication   Communication Communication: No apparent difficulties    Cognition Arousal: Alert Behavior During Therapy: WFL for tasks assessed/performed   PT - Cognitive impairments: Memory                         Following commands: Intact       Cueing Cueing Techniques: Verbal cues     General Comments General comments (skin integrity, edema, etc.): patient reports feeling slightly weaker than baseline. she is hopeful for discharge home soon    Exercises     Assessment/Plan    PT Assessment Patient needs continued PT services  PT Problem List Decreased strength;Decreased activity tolerance;Decreased mobility       PT Treatment Interventions DME instruction;Gait training;Stair training;Therapeutic activities;Functional mobility training;Therapeutic exercise;Balance training;Neuromuscular re-education;Cognitive remediation;Patient/family education    PT Goals (Current goals can be found in the Care Plan section)  Acute Rehab PT Goals Patient Stated Goal: home PT Goal Formulation: With patient Time For Goal Achievement: 05/28/24 Potential to Achieve Goals: Fair    Frequency Min 2X/week     Co-evaluation               AM-PAC PT 6 Clicks Mobility  Outcome Measure Help needed turning from your back to your side while in a flat bed without using bedrails?: None Help needed moving from lying on your back to sitting on the side of a flat bed without using bedrails?: A Little Help needed moving to and from a bed to a chair (including a wheelchair)?: A Little Help needed standing up from a chair using your arms (e.g., wheelchair or bedside chair)?: A Little Help needed to walk in hospital room?: A Little Help needed climbing 3-5 steps with a railing? : A Little 6 Click Score: 19    End of Session    Activity Tolerance: Patient tolerated treatment well Patient left: in bed;with call bell/phone within reach;with bed alarm set Nurse Communication: Mobility status PT Visit Diagnosis: Muscle weakness (generalized) (M62.81);Unsteadiness on feet (R26.81)    Time: 9178-9165 PT Time Calculation (min) (ACUTE ONLY): 13 min   Charges:   PT Evaluation $PT Eval Low Complexity: 1 Low   PT General Charges $$ ACUTE PT VISIT: 1 Visit         Randine Essex, PT, MPT   Randine LULLA Essex 05/14/2024, 9:01 AM

## 2024-05-14 NOTE — Progress Notes (Signed)
 " PROGRESS NOTE Rhonda Tyler    DOB: 02-25-1941, 84 y.o.  FMW:[id number]    Code Status: Full Code   DOA: 05/13/2024   LOS: 0  Brief hospital course  Rhonda Tyler is a 84 y.o. female with a PMH significant for hypertension, hyperlipidemia, type 2 diabetes, CKD stage 3b, CHF (EF 65% in 2024),hepatic steatosis, OSA. They presented with ongoing intolerance of PO and weight loss.   ED course: VSS. CBC without leukocytosis, hemoglobin  11.6. CMP showed significant hyponatremia, mild hyperkalemia and significant hyperglycemia along with significant AKI.  Lipase was mildly elevated at 53.   05/14/2024 -tolerating CLD well. Feels improved since admission. GI consulted. 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  - on a clear liquid diet currently and tolerating well - NPO after midnight for EGD in the am  Hyponatremia: Usually multifactorial in the setting of severe hyperglycemia and poor oral intake.  Sodium corrects to 135 today in setting of hyperglycemia  Hyperkalemia- received insulin , calcium  gluconate, and patiromer  - BMP am   AKI: Likely prerenal in the setting of poor oral intake.  - has resolved to baseline. Cr 1.3 today   T2DM with hyperglycemia on presentation.  Not in DKA.   - continue SSI    Hypertension: Holding home medicine given AKI.  Will continue  to monitor blood pressure and can use IV medicines as needed.   GERD: Continue home PPI.   CHF: Holding home diuretic given significant hyponatremia.   Iron  deficiency anemia: holding her iron  supplementation currently.  If no other cause for her GI symptoms, iron  supplementation can be considered as an etiology.  She follows with oncology for this.   Cognitive Impairment: Seen by neurology. On aricept. Will continue.    MDD: continue home zoloft .    OSA: per caregiver not using CPAP. Will continue here given PCP notes.  Body mass index is 25.31 kg/m.  VTE ppx: heparin  injection 5,000 Units Start: 05/13/24 2200  Diet:     Diet   Diet clear liquid Room service appropriate? Yes; Fluid consistency: Thin   Consultants: GI  Subjective 05/14/2024    Rhonda Tyler reports feeling well today. Improved since admission. Denies nausea and is tolerating her CLD well without issues.    Objective  Blood pressure (!) 122/92, pulse 75, temperature 98.8 F (37.1 C), resp. rate 18, height 5' 3 (1.6 m), weight 64.8 kg, SpO2 100%.  Intake/Output Summary (Last 24 hours) at 05/14/2024 0731 Last data filed at 05/14/2024 0400 Gross per 24 hour  Intake 720 ml  Output 2320 ml  Net -1600 ml   Filed Weights   05/13/24 2020  Weight: 64.8 kg    Physical Exam:  General: awake, alert, NAD HEENT: atraumatic, clear conjunctiva, anicteric sclera, MMM, hearing grossly normal Respiratory: normal respiratory effort. Cardiovascular: extremities well perfused, quick capillary refill, normal S1/S2, RRR, no  JVD, murmurs Gastrointestinal: soft, NT, ND Nervous: A&O x3. no gross focal neurologic deficits, normal speech Extremities: moves all equally, no edema, normal tone Skin: dry, intact, normal temperature, normal color. No rashes, lesions or ulcers on exposed skin Psychiatry: normal mood, congruent affect  Labs   I have personally reviewed the following labs and imaging studies CBC    Component Value Date/Time    WBC 6.0 05/14/2024 0454   RBC 3.91 05/14/2024 0454   HGB 10.5 (L) 05/14/2024 0454   HGB 11.1 (L) 04/26/2023 1107   HCT 31.7 (L) 05/14/2024 0454   PLT 152 05/14/2024 0454   PLT 165 04/26/2023 1107   MCV 81.1 05/14/2024 0454   MCH 26.9 05/14/2024 0454   MCHC 33.1 05/14/2024 0454   RDW 15.2 05/14/2024 0454   LYMPHSABS 1.0 01/22/2024 0703   MONOABS 0.4 01/22/2024 0703   EOSABS 0.2 01/22/2024 0703   BASOSABS 0.0 01/22/2024 0703      Latest Ref Rng & Units 05/14/2024    4:54 AM 05/13/2024   11:36 PM 05/13/2024    1:49 PM  BMP  Glucose 70 - 99 mg/dL 758   768   BUN 8 - 23 mg/dL 49   69   Creatinine 9.55 - 1.00 mg/dL 8.65   8.14   Sodium 864 - 145 mmol/L 132  128  125   Potassium 3.5 - 5.1 mmol/L 5.3   4.8   Chloride 98 - 111 mmol/L 94   85   CO2 22 - 32 mmol/L 28   29   Calcium  8.9 - 10.3 mg/dL 9.8   89.5     CT ABDOMEN PELVIS WO CONTRAST Result Date: 05/12/2024 CLINICAL DATA:  Nausea.  Melena.  Abnormal weight loss. EXAM: CT ABDOMEN AND PELVIS WITHOUT CONTRAST TECHNIQUE: Multidetector CT imaging of the abdomen and pelvis was performed following the standard protocol without IV contrast. RADIATION DOSE REDUCTION: This exam was performed according to the departmental dose-optimization program which includes automated exposure control, adjustment of the mA and/or kV according to patient size and/or use of iterative reconstruction technique. COMPARISON:  CT scan abdomen and pelvis from 01/18/2024. FINDINGS: Lower chest: The lung bases are clear. No pleural effusion. The heart is normal in size. No pericardial effusion. Hepatobiliary: The liver is normal in size. Non-cirrhotic configuration. No suspicious mass. No intrahepatic or extrahepatic bile duct dilation. Gallbladder is surgically absent. Pancreas: Unremarkable. No pancreatic ductal dilatation or surrounding inflammatory changes. Spleen: Within normal limits. No focal lesion. Adrenals/Urinary Tract: Unremarkable right adrenal gland. There is  a stable well-circumscribed left adrenal nodule with central dystrophic calcifications, favored to represent an adenoma. No suspicious renal mass within the limitations of this unenhanced exam. No nephroureterolithiasis or obstructive uropathy. Unremarkable urinary bladder. Stomach/Bowel: No disproportionate dilation of the small or large bowel loops. No evidence of abnormal bowel wall thickening or inflammatory changes. The appendix was not visualized; however there is no acute inflammatory process in the right lower quadrant. Vascular/Lymphatic: Redemonstration of diffuse fat stranding mainly in the central pelvis, which is essentially similar to the prior study. There is resolution of previously noted ascites. No abscess or loculated collection. No pneumoperitoneum. No abdominal or pelvic lymphadenopathy, by size criteria. No aneurysmal dilation of the major abdominal arteries. There are moderate peripheral atherosclerotic vascular calcifications of the aorta and its major branches. Reproductive: The uterus is surgically absent. No large adnexal mass. Other: There is small fat containing periumbilical hernia. There are soft tissue density areas in the anterior abdominal wall subcutaneous  tissue, most likely due to medication injections. Musculoskeletal: No suspicious osseous lesions. There are moderate - marked multilevel degenerative changes in the visualized spine. Redemonstration of mild compression deformities of T11 and L3 vertebrae, similar to the prior study. No significant retropulsion or spinal canal compromise. IMPRESSION: 1. No acute inflammatory process identified within the abdomen or pelvis. 2. Redemonstration of diffuse fat stranding mainly in the central pelvis, which is essentially similar to the prior study. There is resolution of previously noted ascites. No abscess or loculated collection. No pneumoperitoneum. 3. Multiple other nonacute observations, as described above. Aortic Atherosclerosis  (ICD10-I70.0). Electronically Signed   By: Ree Molt M.D.   On: 05/12/2024 13:06   Disposition Plan & Communication  Patient status: Observation  Admitted From: Home Planned disposition location: Home Anticipated discharge date: TBD pending clinical course  Family Communication: none at bedside     Author: Marien LITTIE Piety, DO [facility] 05/14/2024, 7:31 AM   Available by Epic secure chat 7AM-7PM. If 7PM-7AM, please contact night-coverage.  [facility] contact information found on [email or web address].  "

## 2024-05-14 NOTE — Plan of Care (Signed)
  Problem: Clinical Measurements: Goal: Cardiovascular complication will be avoided Outcome: Progressing   Problem: Activity: Goal: Risk for activity intolerance will decrease Outcome: Progressing   Problem: Elimination: Goal: Will not experience complications related to bowel motility Outcome: Progressing Goal: Will not experience complications related to urinary retention Outcome: Progressing   Problem: Pain Managment: Goal: General experience of comfort will improve and/or be controlled Outcome: Progressing   Problem: Safety: Goal: Ability to remain free from injury will improve Outcome: Progressing

## 2024-05-15 ENCOUNTER — Observation Stay: Admitting: Certified Registered"

## 2024-05-15 ENCOUNTER — Encounter: Payer: Self-pay | Attending: Student in an Organized Health Care Education/Training Program

## 2024-05-15 ENCOUNTER — Encounter: Payer: Self-pay | Admitting: Gastroenterology

## 2024-05-15 DIAGNOSIS — E875 Hyperkalemia: Secondary | ICD-10-CM

## 2024-05-15 DIAGNOSIS — K3184 Gastroparesis: Secondary | ICD-10-CM | POA: Diagnosis not present

## 2024-05-15 DIAGNOSIS — E1143 Type 2 diabetes mellitus with diabetic autonomic (poly)neuropathy: Secondary | ICD-10-CM

## 2024-05-15 DIAGNOSIS — E871 Hypo-osmolality and hyponatremia: Secondary | ICD-10-CM | POA: Diagnosis not present

## 2024-05-15 DIAGNOSIS — R627 Adult failure to thrive: Secondary | ICD-10-CM | POA: Diagnosis not present

## 2024-05-15 HISTORY — PX: BIOPSY OF SKIN SUBCUTANEOUS TISSUE AND/OR MUCOUS MEMBRANE: SHX6741

## 2024-05-15 HISTORY — PX: ESOPHAGOGASTRODUODENOSCOPY: SHX5428

## 2024-05-15 LAB — CBC
HCT: 32.2 % — ABNORMAL LOW (ref 36.0–46.0)
Hemoglobin: 10.7 g/dL — ABNORMAL LOW (ref 12.0–15.0)
MCH: 26.8 pg (ref 26.0–34.0)
MCHC: 33.2 g/dL (ref 30.0–36.0)
MCV: 80.5 fL (ref 80.0–100.0)
Platelets: 164 K/uL (ref 150–400)
RBC: 4 MIL/uL (ref 3.87–5.11)
RDW: 14.8 % (ref 11.5–15.5)
WBC: 4.4 K/uL (ref 4.0–10.5)
nRBC: 0 % (ref 0.0–0.2)

## 2024-05-15 LAB — RESPIRATORY PANEL BY PCR

## 2024-05-15 LAB — BASIC METABOLIC PANEL WITH GFR
Anion gap: 8 (ref 5–15)
BUN: 30 mg/dL — ABNORMAL HIGH (ref 8–23)
CO2: 28 mmol/L (ref 22–32)
Calcium: 10.2 mg/dL (ref 8.9–10.3)
Chloride: 97 mmol/L — ABNORMAL LOW (ref 98–111)
Creatinine, Ser: 1.19 mg/dL — ABNORMAL HIGH (ref 0.44–1.00)
GFR, Estimated: 46 mL/min — ABNORMAL LOW
Glucose, Bld: 184 mg/dL — ABNORMAL HIGH (ref 70–99)
Potassium: 5.3 mmol/L — ABNORMAL HIGH (ref 3.5–5.1)
Sodium: 133 mmol/L — ABNORMAL LOW (ref 135–145)

## 2024-05-15 LAB — GLUCOSE, CAPILLARY
Glucose-Capillary: 235 mg/dL — ABNORMAL HIGH (ref 70–99)
Glucose-Capillary: 256 mg/dL — ABNORMAL HIGH (ref 70–99)
Glucose-Capillary: 120 mg/dL — ABNORMAL HIGH (ref 70–99)
Glucose-Capillary: 387 mg/dL — ABNORMAL HIGH (ref 70–99)

## 2024-05-15 MED ORDER — PSEUDOEPHEDRINE HCL ER 120 MG PO TB12
120.0000 mg | ORAL_TABLET | Freq: Two times a day (BID) | ORAL | Status: AC
  Administered 2024-05-15 (×2): 120 mg via ORAL
  Filled 2024-05-15 (×2): qty 1

## 2024-05-15 MED ORDER — PHENOL 1.4 % MT LIQD: 1.0000 | OROMUCOSAL | Status: DC | PRN

## 2024-05-15 MED ORDER — IPRATROPIUM-ALBUTEROL 0.5-2.5 (3) MG/3ML IN SOLN
3.0000 mL | Freq: Once | RESPIRATORY_TRACT | Status: AC
  Administered 2024-05-15: 3 mL via RESPIRATORY_TRACT
  Filled 2024-05-15: qty 3

## 2024-05-15 MED ORDER — ONDANSETRON HCL 4 MG/2ML IJ SOLN
INTRAMUSCULAR | Status: DC | PRN
  Administered 2024-05-15: 4 mg via INTRAVENOUS

## 2024-05-15 MED ORDER — SODIUM CHLORIDE 0.9 % IV SOLN: INTRAVENOUS | Status: DC

## 2024-05-15 MED ORDER — SODIUM ZIRCONIUM CYCLOSILICATE 5 G PO PACK
5.0000 g | PACK | Freq: Three times a day (TID) | ORAL | Status: AC
  Administered 2024-05-15 (×3): 5 g via ORAL
  Filled 2024-05-15 (×3): qty 1

## 2024-05-15 MED ORDER — SERTRALINE HCL 50 MG PO TABS
100.0000 mg | ORAL_TABLET | Freq: Every day | ORAL | Status: DC
  Administered 2024-05-15 – 2024-05-16 (×2): 100 mg via ORAL
  Filled 2024-05-15 (×2): qty 2

## 2024-05-15 MED ORDER — SALINE SPRAY 0.65 % NA SOLN: 1.0000 | NASAL | Status: DC | PRN

## 2024-05-15 MED ORDER — PROPOFOL 500 MG/50ML IV EMUL
INTRAVENOUS | Status: DC | PRN
  Administered 2024-05-15: 110 ug/kg/min via INTRAVENOUS

## 2024-05-15 MED ORDER — GUAIFENESIN-DM 100-10 MG/5ML PO SYRP
5.0000 mL | ORAL_SOLUTION | ORAL | Status: DC | PRN
  Administered 2024-05-15: 5 mL via ORAL
  Filled 2024-05-15: qty 10

## 2024-05-15 MED ORDER — SPIRONOLACTONE 25 MG PO TABS: 50.0000 mg | ORAL_TABLET | Freq: Every day | ORAL | Status: DC

## 2024-05-15 MED ORDER — LISINOPRIL 5 MG PO TABS
5.0000 mg | ORAL_TABLET | Freq: Every day | ORAL | Status: DC
  Administered 2024-05-16: 5 mg via ORAL
  Filled 2024-05-15: qty 1

## 2024-05-15 NOTE — Plan of Care (Signed)

## 2024-05-15 NOTE — Transfer of Care (Signed)
 Immediate Anesthesia Transfer of Care Note  Patient: Rhonda Tyler  Procedure(s) Performed: EGD (ESOPHAGOGASTRODUODENOSCOPY)  Patient Location: PACU  Anesthesia Type:General  Level of Consciousness: drowsy  Airway & Oxygen Therapy: Patient Spontanous Breathing  Post-op Assessment: Report given to RN, Post -op Vital signs reviewed and stable, and Patient moving all extremities X 4  Post vital signs: Reviewed and stable  Last Vitals:  Vitals Value Taken Time  BP 140/58   Temp    Pulse 96 05/15/24 14:26  Resp 22 05/15/24 14:26  SpO2 100 % 05/15/24 14:26  Vitals shown include unfiled device data.  Last Pain:  Vitals:   05/15/24 1325  TempSrc:   PainSc: 0-No pain      Patients Stated Pain Goal: 0 (05/14/24 2150)  Complications: No notable events documented.

## 2024-05-15 NOTE — Plan of Care (Signed)
  Problem: Clinical Measurements: Goal: Cardiovascular complication will be avoided Outcome: Progressing   Problem: Elimination: Goal: Will not experience complications related to urinary retention Outcome: Progressing   

## 2024-05-15 NOTE — Care Management Obs Status (Signed)
 MEDICARE OBSERVATION STATUS NOTIFICATION   Patient Details  Name: Rhonda Tyler MRN: [id number] Date of Birth: 1941/04/18   Medicare Observation Status Notification Given:  Yes    Rhonda Tyler 05/15/2024, 12:55 PM

## 2024-05-15 NOTE — Progress Notes (Signed)
 " PROGRESS NOTE Rhonda Tyler    DOB: July 23, 1940, 84 y.o.  FMW:[id number]    Code Status: Full Code   DOA: 05/13/2024   LOS: 0  Brief hospital course  Rhonda Tyler is a 84 y.o. female with a PMH significant for hypertension, hyperlipidemia, type 2 diabetes, CKD stage 3b, CHF (EF 65% in 2024),hepatic steatosis, OSA. They presented with ongoing intolerance of PO and weight loss.   ED course: VSS. CBC without leukocytosis, hemoglobin  11.6. CMP showed significant hyponatremia, mild hyperkalemia and significant hyperglycemia along with significant AKI.  Lipase was mildly elevated at 53.   05/15/2024 -nausea present this am. Underwent EGD  Assessment & Plan  Principal Problem:   Failure to thrive in adult Active Problems:   Iron  deficiency anemia   Uncontrolled type 2 diabetes mellitus with hyperglycemia, with long-term current use of insulin  (HCC)   Essential hypertension   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)   CKD stage 3b, GFR 30-44 ml/min (HCC)   DDD (degenerative disc disease), lumbosacral   Hyperlipidemia due to type 2 diabetes mellitus (HCC)   OSA on CPAP   Chronic diastolic CHF (congestive heart failure) (HCC)   GERD without esophagitis  nausea, dark stools and decreased appetite- present 1 year GI evaluated with EGD today and found Gastric polyps, duodenal ulcer. Bx taken for H pylori - will assess further for gastroparesis  - continue PPI - GI was consulted.  - SLP eval - continue regular diet per patient request - antiemetics PRN  Cough, congestion, respiratory distress- was on 2L and now weaned to room air RVP revealed positive MPV - supportive care  Hyponatremia: multifactorial in the setting of severe hyperglycemia and poor oral intake.  Sodium corrects to 135 today in setting of hyperglycemia - BMP am   Hyperkalemia- received insulin , calcium  gluconate, and patiromer  Giving lokelma  today - BMP am   AKI: Likely prerenal in the  setting of poor oral intake.  - has resolved to baseline. Cr 1.19  T2DM with hyperglycemia on presentation.  Not in DKA.   - continue SSI    Hypertension: Holding home medicine given AKI.  Will continue to monitor blood pressure and can use IV medicines as needed.   GERD: Continue home PPI.   CHF: Holding home diuretic given significant hyponatremia.   Iron  deficiency anemia: holding her iron  supplementation currently.  If no other cause for her GI symptoms, iron  supplementation can be considered as an etiology.  She follows with oncology for this.   Cognitive Impairment: Seen by neurology. On aricept. Will continue.    MDD: continue home zoloft .    OSA: per caregiver not using CPAP. Will continue here given PCP notes.  Body mass index is 22.46 kg/m.  VTE ppx: heparin  injection 5,000 Units Start: 05/13/24 2200  Diet:     Diet   Diet NPO time specified Except for: Sips with Meds, Ice Chips   Consultants: GI  Subjective 05/15/2024    Pt reports feeling nausea today. Also complains of nasal congestion and cough. Significant rhinorrhea.  Objective  Blood pressure (!) 122/92, pulse 75, temperature 98.8 F (37.1 C), resp. rate 18, height 5' 3 (1.6 m), weight 64.8 kg, SpO2 100%.  Intake/Output Summary (Last 24 hours) at 05/15/2024 0741 Last data filed at 05/15/2024 0500 Gross per 24 hour  Intake 847.75 ml  Output 1750 ml  Net -902.25 ml   Filed Weights   05/13/24 2020 05/15/24 0500  Weight: 64.8 kg 57.5  kg    Physical Exam:  General: awake, alert, NAD HEENT: atraumatic, clear conjunctiva, anicteric sclera, MMM, hearing grossly normal Respiratory: normal respiratory effort. Cardiovascular: extremities well perfused, quick capillary refill, normal S1/S2, RRR, no JVD, murmurs Gastrointestinal: soft, NT, ND Nervous: A&O x3. no gross focal neurologic deficits, normal speech Extremities: moves all equally, no edema, normal tone Skin: dry, intact, normal temperature, normal  color. No rashes, lesions or ulcers on exposed skin Psychiatry: normal mood, congruent affect  Labs   I have personally reviewed the following labs and imaging studies CBC    Component Value Date/Time   WBC 4.4 05/15/2024 0434   RBC 4.00 05/15/2024 0434   HGB 10.7 (L) 05/15/2024 0434   HGB 11.1 (L) 04/26/2023 1107   HCT 32.2 (L) 05/15/2024 0434   PLT 164 05/15/2024 0434   PLT 165 04/26/2023 1107   MCV 80.5 05/15/2024 0434   MCH 26.8 05/15/2024 0434   MCHC 33.2 05/15/2024 0434   RDW 14.8 05/15/2024 0434   LYMPHSABS 1.0 01/22/2024 0703   MONOABS 0.4 01/22/2024 0703   EOSABS 0.2 01/22/2024 0703   BASOSABS 0.0 01/22/2024 0703      Latest Ref Rng & Units 05/15/2024    4:34 AM 05/14/2024   10:54 AM 05/14/2024    4:54 AM  BMP  Glucose 70 - 99 mg/dL 815   758   BUN 8 - 23 mg/dL 30   49   Creatinine 9.55 - 1.00 mg/dL 8.80   8.65   Sodium 864 - 145 mmol/L 133  128  132   Potassium 3.5 - 5.1 mmol/L 5.3   5.3   Chloride 98 - 111 mmol/L 97   94   CO2 22 - 32 mmol/L 28   28   Calcium  8.9 - 10.3 mg/dL 89.7   9.8     No results found.  Disposition Plan & Communication  Patient status: Observation  Admitted From: Home Planned disposition location: Home Anticipated discharge date: TBD pending clinical course  Family Communication: none at bedside     Author: Marien LITTIE Piety, DO [facility] 05/15/2024, 7:41 AM   Available by Epic secure chat 7AM-7PM. If 7PM-7AM, please contact night-coverage.  [facility] contact information found on [email or web address].  "

## 2024-05-15 NOTE — Inpatient Diabetes Management (Addendum)
 Inpatient Diabetes Program Recommendations  AACE/ADA: New Consensus Statement on Inpatient Glycemic Control  Target Ranges:  Prepandial:   less than 140 mg/dL      Peak postprandial:   less than 180 mg/dL (1-2 hours)      Critically ill patients:  140 - 180 mg/dL    Latest Reference Range & Units 05/13/24 11:56 05/13/24 13:32 05/13/24 18:26 05/13/24 22:45 05/14/24 08:39 05/14/24 13:38 05/14/24 17:26 05/14/24 20:41 05/15/24 09:15  Glucose-Capillary 70 - 99 mg/dL 422 (HH) 708 (H) 606 (H) 207 (H) 256 (H) 366 (H) 180 (H) 148 (H) 235 (H)    Latest Reference Range & Units 01/19/24 06:30 05/14/24 04:54  Hemoglobin A1C 4.8 - 5.6 % 11.5 (H) 12.2 (H)   Review of Glycemic Control  Diabetes history: DM2 Outpatient Diabetes medications: 70/30 20-25 units QAM, Regular 1-10 units daily at lunch Current orders for Inpatient glycemic control: Lantus  5 units daily, Novolog  0-15 units TID with meals, Novolog  0-5 units QHS  Inpatient Diabetes Program Recommendations:    Insulin : Please consider increasing Lantus  to 9 units daily.    HbgA1C:  A1C 12.2% on 05/14/24 indicating an average glucose of 303 mg/dl over the past 2-3 months. Last A1C was 11.5% on 01/19/24 and patient was seen by inpatient diabetes coordinator on 01/19/24 and 01/21/24 during last hospital admission.  Addendum 05/15/24@13 :55-Went by patient's room to talk with patient about DM medication regimen and DM control. Patient off unit in Endoscopy. Will plan to follow up with patient tomorrow.  Thanks, Earnie Gainer, RN, MSN, CDCES Diabetes Coordinator Inpatient Diabetes Program [phone] (Team Pager from 8am to 5pm)

## 2024-05-15 NOTE — Anesthesia Preprocedure Evaluation (Signed)
 "                                  Anesthesia Evaluation  Patient identified by MRN, date of birth, ID band Patient awake    Reviewed: Allergy & Precautions, NPO status , Patient's Chart, lab work & pertinent test results  Airway Mallampati: II  TM Distance: >3 FB Neck ROM: full    Dental  (+) Upper Dentures, Lower Dentures   Pulmonary neg pulmonary ROS, sleep apnea , Patient abstained from smoking., former smoker   Pulmonary exam normal  + decreased breath sounds      Cardiovascular Exercise Tolerance: Poor hypertension, Pt. on medications + angina  + CAD, + Past MI, + CABG and +CHF  negative cardio ROS Normal cardiovascular exam Rhythm:Regular Rate:Normal     Neuro/Psych  Headaches   Depression    negative neurological ROS  negative psych ROS   GI/Hepatic negative GI ROS, Neg liver ROS,GERD  Medicated,,  Endo/Other  negative endocrine ROSdiabetes, Type 2    Renal/GU negative Renal ROS  negative genitourinary   Musculoskeletal   Abdominal   Peds negative pediatric ROS (+)  Hematology negative hematology ROS (+) Blood dyscrasia, anemia   Anesthesia Other Findings Past Medical History: No date: Anginal pain No date: Arthritis No date: B12 deficiency anemia No date: Cervical disc disease No date: Cervical disc disease No date: Chest pain No date: Cholecystolithiasis No date: Chronic kidney disease 11/2019: Cirrhosis of liver (HCC) No date: Colon polyp No date: Coronary artery disease 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) No date: Diabetes mellitus without complication (HCC)     Comment:  Pt takes insulin  No date: Diabetic neuropathy (HCC) No date: GERD (gastroesophageal reflux disease) No date: Headache     Comment:  migaines in the past No date: Hemorrhoid No date: History of diabetic neuropathy No date: Hypercalcemia No date: Hyperlipemia No date: Hypertension No date: IBS (irritable bowel syndrome) No date: Iron  deficiency  anemia No date: Myocardial infarction (HCC) No date: Pneumonia No date: Sleep apnea     Comment:  NO CPAP  Past Surgical History: No date: ABDOMINAL HYSTERECTOMY No date: arthroscopic rotator cuff repair No date: BACK SURGERY No date: CARDIAC CATHETERIZATION 01/26/2022: CATARACT EXTRACTION W/PHACO; Right     Comment:  Procedure: CATARACT EXTRACTION PHACO AND INTRAOCULAR               LENS PLACEMENT (IOC) RIGHT DIABETIC;  Surgeon: Myrna Adine Anes, MD;  Location: [facility];                Service: Ophthalmology;  Laterality: Right;  2.90 0:29.3 02/09/2022: CATARACT EXTRACTION W/PHACO; Left     Comment:  Procedure: CATARACT EXTRACTION PHACO AND INTRAOCULAR               LENS PLACEMENT (IOC) LEFT DIABETIC 3.54 00:32.3;                Surgeon: Myrna Adine Anes, MD;  Location: [facility];  Service: Ophthalmology;  Laterality: Left;              Diabetic No date: CHOLECYSTECTOMY No date: COLONOSCOPY 11/29/2017: COLONOSCOPY WITH PROPOFOL ; N/A     Comment:  Procedure: COLONOSCOPY WITH PROPOFOL ;  Surgeon:  Gaylyn Gladis PENNER, MD;  Location: [facility] ENDOSCOPY;                Service: Endoscopy;  Laterality: N/A; No date: CORONARY ANGIOPLASTY     Comment:  PTCA and stent of RCA No date: CORONARY ARTERY BYPASS GRAFT No date: ESOPHAGOGASTRODUODENOSCOPY 11/29/2017: ESOPHAGOGASTRODUODENOSCOPY (EGD) WITH PROPOFOL ; N/A     Comment:  Procedure: ESOPHAGOGASTRODUODENOSCOPY (EGD) WITH               PROPOFOL ;  Surgeon: Gaylyn Gladis PENNER, MD;  Location:               [facility] ENDOSCOPY;  Service: Endoscopy;  Laterality: N/A; 03/07/2018: ESOPHAGOGASTRODUODENOSCOPY (EGD) WITH PROPOFOL ; N/A     Comment:  Procedure: ESOPHAGOGASTRODUODENOSCOPY (EGD) WITH               PROPOFOL ;  Surgeon: Gaylyn Gladis PENNER, MD;  Location:               [facility] ENDOSCOPY;  Service: Endoscopy;  Laterality: N/A; 08/16/2019: ESOPHAGOGASTRODUODENOSCOPY (EGD) WITH PROPOFOL ;  N/A     Comment:  Procedure: ESOPHAGOGASTRODUODENOSCOPY (EGD) WITH               PROPOFOL ;  Surgeon: Toledo, Ladell POUR, MD;  Location:               [facility] ENDOSCOPY;  Service: Gastroenterology;  Laterality:               N/A; No date: EXCISION OF ADNEXAL MASS 04/2021: JOINT REPLACEMENT     Comment:  left total knee replacement No date: OOPHORECTOMY 05/07/2021: TOTAL KNEE REVISION; Left     Comment:  Procedure: TOTAL KNEE REVISION;  Surgeon: Mardee Lynwood SQUIBB, MD;  Location: [facility] ORS;  Service: Orthopedics;                Laterality: Left; No date: vesicular vaginal fistula  BMI    Body Mass Index: 22.46 kg/m      Reproductive/Obstetrics negative OB ROS                              Anesthesia Physical Anesthesia Plan  ASA: 3  Anesthesia Plan: General   Post-op Pain Management:    Induction: Intravenous  PONV Risk Score and Plan: Propofol  infusion and TIVA  Airway Management Planned: Natural Airway and Nasal Cannula  Additional Equipment:   Intra-op Plan:   Post-operative Plan:   Informed Consent: I have reviewed the patients History and Physical, chart, labs and discussed the procedure including the risks, benefits and alternatives for the proposed anesthesia with the patient or authorized representative who has indicated his/her understanding and acceptance.     Dental Advisory Given  Plan Discussed with: CRNA  Anesthesia Plan Comments:         Anesthesia Quick Evaluation  "

## 2024-05-15 NOTE — Anesthesia Procedure Notes (Signed)
 Procedure Name: MAC Date/Time: 05/15/2024 2:08 PM  Performed by: Brandy Almarie BROCKS, CRNAPre-anesthesia Checklist: Patient identified, Emergency Drugs available, Suction available and Patient being monitored Oxygen Delivery Method: Simple face mask

## 2024-05-15 NOTE — Progress Notes (Signed)
 Red purse and wallet sent home with caregiver Addie; purple robe, shoes, pants, top, blue jacket

## 2024-05-15 NOTE — Op Note (Signed)
 [facility] Patient Name: Rhonda Tyler Procedure Date: 05/15/2024 1:54 PM MRN: [id number] Account #: [id number] Date of Birth: 02/24/1941 Admit Type: Inpatient Age: 84 Room: [facility] ENDO ROOM 4 Gender: Female Note Status: Finalized Instrument Name: Upper GI Scope [id number] Procedure:             Upper GI endoscopy Indications:           Nausea with vomiting Providers:             Rogelia Copping MD, MD Medicines:             Propofol  per Anesthesia Complications:         No immediate complications. Procedure:             Pre-Anesthesia Assessment:                        - Prior to the procedure, a History and Physical was                         performed, and patient medications and allergies were                         reviewed. The patient's tolerance of previous                         anesthesia was also reviewed. The risks and benefits                         of the procedure and the sedation options and risks                         were discussed with the patient. All questions were                         answered, and informed consent was obtained. Prior                         Anticoagulants: The patient has taken no anticoagulant                         or antiplatelet agents. ASA Grade Assessment: II - A                         patient with mild systemic disease. After reviewing                         the risks and benefits, the patient was deemed in                         satisfactory condition to undergo the procedure.                        After obtaining informed consent, the endoscope was                         passed under direct vision. Throughout the procedure,                         the  patient's blood pressure, pulse, and oxygen                         saturations were monitored continuously. The Endoscope                         was introduced through the mouth, and advanced to the                         second part of  duodenum. The upper GI endoscopy was                         accomplished without difficulty. The patient tolerated                         the procedure well. Findings:      A small hiatal hernia was present.      A few pedunculated polyps were found in the gastric fundus and in the       gastric body. Biopsies were taken with a cold forceps for histology.      A few erosions without bleeding were found in the duodenal bulb. Impression:            - Small hiatal hernia.                        - A few gastric polyps. Biopsied.                        - Duodenal erosions without bleeding. Recommendation:        - Return patient to hospital ward for ongoing care.                        - Resume previous diet.                        - Continue present medications.                        - No cause for the nausea vomiting and weight loss                         seen. Procedure Code(s):     --- Professional ---                        [phone], Esophagogastroduodenoscopy, flexible,                         transoral; with biopsy, single or multiple Diagnosis Code(s):     --- Professional ---                        R11.2, Nausea with vomiting, unspecified CPT copyright 2022 American Medical Association. All rights reserved. The codes documented in this report are preliminary and upon coder review may  be revised to meet current compliance requirements. Rogelia Copping MD, MD 05/15/2024 2:28:17 PM This report has been signed electronically. Number of Addenda: 0 Note Initiated On: 05/15/2024 1:54 PM Estimated Blood Loss:  Estimated blood loss: none.      [facility]

## 2024-05-16 ENCOUNTER — Other Ambulatory Visit: Payer: Self-pay

## 2024-05-16 DIAGNOSIS — R112 Nausea with vomiting, unspecified: Secondary | ICD-10-CM

## 2024-05-16 DIAGNOSIS — R627 Adult failure to thrive: Secondary | ICD-10-CM | POA: Diagnosis not present

## 2024-05-16 DIAGNOSIS — E871 Hypo-osmolality and hyponatremia: Secondary | ICD-10-CM | POA: Diagnosis not present

## 2024-05-16 DIAGNOSIS — N179 Acute kidney failure, unspecified: Secondary | ICD-10-CM | POA: Diagnosis not present

## 2024-05-16 DIAGNOSIS — N189 Chronic kidney disease, unspecified: Secondary | ICD-10-CM | POA: Diagnosis not present

## 2024-05-16 LAB — COMPREHENSIVE METABOLIC PANEL WITH GFR
ALT: 10 U/L (ref 0–44)
AST: 17 U/L (ref 15–41)
Albumin: 3.3 g/dL — ABNORMAL LOW (ref 3.5–5.0)
Alkaline Phosphatase: 121 U/L (ref 38–126)
Anion gap: 10 (ref 5–15)
BUN: 22 mg/dL (ref 8–23)
CO2: 27 mmol/L (ref 22–32)
Calcium: 9.8 mg/dL (ref 8.9–10.3)
Chloride: 91 mmol/L — ABNORMAL LOW (ref 98–111)
Creatinine, Ser: 1.18 mg/dL — ABNORMAL HIGH (ref 0.44–1.00)
GFR, Estimated: 46 mL/min — ABNORMAL LOW
Glucose, Bld: 314 mg/dL — ABNORMAL HIGH (ref 70–99)
Potassium: 4.9 mmol/L (ref 3.5–5.1)
Sodium: 128 mmol/L — ABNORMAL LOW (ref 135–145)
Total Bilirubin: 0.5 mg/dL (ref 0.0–1.2)
Total Protein: 7.1 g/dL (ref 6.5–8.1)

## 2024-05-16 LAB — CBC
HCT: 33.2 % — ABNORMAL LOW (ref 36.0–46.0)
Hemoglobin: 10.7 g/dL — ABNORMAL LOW (ref 12.0–15.0)
MCH: 26.3 pg (ref 26.0–34.0)
MCHC: 32.2 g/dL (ref 30.0–36.0)
MCV: 81.6 fL (ref 80.0–100.0)
Platelets: 164 K/uL (ref 150–400)
RBC: 4.07 MIL/uL (ref 3.87–5.11)
RDW: 14.5 % (ref 11.5–15.5)
WBC: 4.4 K/uL (ref 4.0–10.5)
nRBC: 0 % (ref 0.0–0.2)

## 2024-05-16 LAB — GLUCOSE, CAPILLARY
Glucose-Capillary: 299 mg/dL — ABNORMAL HIGH (ref 70–99)
Glucose-Capillary: 331 mg/dL — ABNORMAL HIGH (ref 70–99)

## 2024-05-16 LAB — SURGICAL PATHOLOGY

## 2024-05-16 MED ORDER — INSULIN GLARGINE 100 UNIT/ML ~~LOC~~ SOLN
10.0000 [IU] | Freq: Every day | SUBCUTANEOUS | Status: DC
  Administered 2024-05-16: 10 [IU] via SUBCUTANEOUS
  Filled 2024-05-16: qty 0.1

## 2024-05-16 NOTE — Discharge Summary (Signed)
 " Physician Discharge Summary  Patient: Rhonda Tyler FMW:[id number] DOB: 1941-02-20   Code Status: Full Code Admit date: 05/13/2024 Discharge date: 05/16/2024 Disposition: Home, PT PCP: Lenon Layman ORN, MD  Recommendations for Outpatient Follow-up:  Follow up with PCP within 1-2 weeks Regarding general hospital follow up and preventative care Recommend CMP Consider gastric emptying study if symptoms return/continue Follow up Bx results from EGD  Discharge Diagnoses:  Principal Problem:   Failure to thrive in adult Active Problems:   Iron  deficiency anemia   Uncontrolled type 2 diabetes mellitus with hyperglycemia, with long-term current use of insulin  (HCC)   Essential hypertension   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)   CKD stage 3b, GFR 30-44 ml/min (HCC)   DDD (degenerative disc disease), lumbosacral   Hyperlipidemia due to type 2 diabetes mellitus (HCC)   OSA on CPAP   Chronic diastolic CHF (congestive heart failure) (HCC)   GERD without esophagitis  Brief Hospital Course Summary: Rhonda Tyler is a 84 y.o. female with a PMH significant for hypertension, hyperlipidemia, type 2 diabetes, CKD stage 3b, CHF (EF 65% in 2024),hepatic steatosis, OSA. They presented with ongoing intolerance of PO and weight loss.   ED course: VSS. CBC without leukocytosis, hemoglobin  11.6. CMP showed significant hyponatremia, mild hyperkalemia and significant hyperglycemia along with significant AKI.  Lipase was mildly elevated at 53.   nausea, dark stools and decreased appetite- present 1 year GI evaluated with EGD and found Gastric polyps, duodenal erosion. Bx taken for H pylori. Her diet was gradually increased after this evaluation and she tolerated well without vomiting. No indication here for her prolonged dry-heaving history.  - SLP evaluated and recommended continuation of a regular diet.  She may benefit from a gastric emptying study outpatient to assess  for gastroparesis.  - continue PPI - antiemetics PRN - PT/OT evaluated and recommended HH PT   Cough, congestion, respiratory distress- was on 2L on admission with considerable congestion. RVP revealed positive MPV. She was treated with supportive care and was weaned to room ai   Hyponatremia: multifactorial in the setting of severe hyperglycemia and poor oral intake.  Sodium corrects to 131 today in setting of hyperglycemia - BMP am    Hyperkalemia- received insulin , calcium  gluconate, and patiromer  and lokelma . Resolved to 4.9 on day of dc   AKI: Likely prerenal in the setting of poor oral intake.  - has resolved to baseline. Cr 1.19 - spironolactone  and torsemide  are paused until follow up given her lower-normal BP range while inpatient and the AKI.    T2DM with hyperglycemia on presentation.  Not in DKA.   - continue SSI    All other chronic conditions were treated with home medications.    Discharge Condition: Good, improved Recommended discharge diet: Regular healthy diet  Consultations: GI  Procedures/Studies: EGD  Allergies as of 05/16/2024       Reactions   Levaquin [levofloxacin In D5w] Shortness Of Breath   Penicillins Hives   Codeine Other (See Comments)   Hallucinate   Lipitor [atorvastatin Calcium ] Other (See Comments)   Muscle pain   Lopressor  [metoprolol  Tartrate] Other (See Comments)   Heart races   Potassium-containing Compounds Other (See Comments)    Facial flushing   Procardia [nifedipine] Other (See Comments)   Heart races   Sucralfate Nausea Only   Tramadol Other (See Comments)   Confusion   Allegra [fexofenadine] Rash   Naltrexone Other (See Comments)   Severe headache, nausea, body  flashes.   Sulfa Antibiotics Rash        Medication List     PAUSE taking these medications    spironolactone  50 MG tablet Wait to take this until your doctor or other care provider tells you to start again. Commonly known as: ALDACTONE  Take 50 mg  by mouth daily.   torsemide  100 MG tablet Wait to take this until your doctor or other care provider tells you to start again. Commonly known as: DEMADEX  Take 100 mg by mouth daily.       STOP taking these medications    azithromycin  250 MG tablet Commonly known as: ZITHROMAX        TAKE these medications    acetaminophen  500 MG tablet Commonly known as: TYLENOL  Take 1 tablet (500 mg total) by mouth every 6 (six) hours as needed.   albuterol  108 (90 Base) MCG/ACT inhaler Commonly known as: VENTOLIN  HFA Inhale 2 puffs into the lungs every 4 (four) hours as needed for wheezing.   alum & mag hydroxide-simeth 200-200-20 MG/5ML suspension Commonly known as: MAALOX/MYLANTA Take 30 mLs by mouth every 4 (four) hours as needed for indigestion or heartburn.   ascorbic acid  500 MG tablet Commonly known as: VITAMIN C  Take 1 tablet (500 mg total) by mouth daily.   B-12 2500 MCG Tabs Take 2,500 mcg by mouth daily.   cholecalciferol  25 MCG (1000 UNIT) tablet Commonly known as: VITAMIN D3 Take 1,000 Units by mouth daily.   diltiazem  120 MG 24 hr capsule Commonly known as: CARDIZEM  CD Take 1 capsule (120 mg total) by mouth daily.   fenofibrate  160 MG tablet Take 160 mg by mouth daily.   gabapentin  100 MG capsule Commonly known as: NEURONTIN  Take 1 capsule by mouth 3 (three) times daily.   hydrOXYzine  25 MG tablet Commonly known as: ATARAX  Take 25 mg by mouth daily as needed for itching.   insulin  regular 100 units/mL injection Commonly known as: NOVOLIN  R Inject 1-10 Units into the skin daily. lunchtime   Iron  325 (65 Fe) MG Tabs Take 1 tablet (325 mg total) by mouth daily.   lisinopril  5 MG tablet Commonly known as: ZESTRIL  Take 1 tablet (5 mg total) by mouth daily. Hold this medication until you see nephro   loratadine  10 MG tablet Commonly known as: CLARITIN  Take 10 mg by mouth daily.   multivitamin with minerals Tabs tablet Take 1 tablet by mouth at  bedtime.   nitroGLYCERIN  0.4 MG SL tablet Commonly known as: NITROSTAT  Place 0.4 mg under the tongue every 5 (five) minutes as needed for chest pain.   NovoLIN  70/30 Kwikpen (70-30) 100 UNIT/ML KwikPen Generic drug: insulin  isophane & regular human KwikPen Inject 20-25 Units into the skin daily with breakfast.   ondansetron  4 MG tablet Commonly known as: ZOFRAN  Take 4 mg by mouth every 8 (eight) hours as needed for vomiting or nausea.   pantoprazole  40 MG tablet Commonly known as: PROTONIX  Take 40 mg by mouth 2 (two) times daily.   polyethylene glycol powder 17 GM/SCOOP powder Commonly known as: GLYCOLAX /MIRALAX  Take 17 g by mouth 2 (two) times daily. What changed:  when to take this reasons to take this   propranolol  ER 60 MG 24 hr capsule Commonly known as: Inderal  LA Take 1 capsule (60 mg total) by mouth daily.   senna 8.6 MG tablet Commonly known as: SENOKOT Take 1 tablet by mouth daily as needed.   sertraline  100 MG tablet Commonly known as: ZOLOFT  Take 100 mg  by mouth daily.        Follow-up Information     Lenon Layman ORN, MD. Schedule an appointment as soon as possible for a visit in 1 week(s).   Specialty: Internal Medicine Contact information: [address] [facility] [place] [address] [phone]                 Subjective   Pt reports feeling well. Congestion is clearing. She is tolerating her diet normally without issues currently.   All questions and concerns were addressed at time of discharge.  Objective  Blood pressure (!) 141/67, pulse 100, temperature 98.6 F (37 C), resp. rate 18, height 5' 3 (1.6 m), weight 57.4 kg, SpO2 100%.   General: Pt is alert, awake, not in acute distress Cardiovascular: RRR, S1/S2 +, no rubs, no gallops Respiratory: CTA bilaterally, no wheezing, no rhonchi Abdominal: Soft, NT, ND, bowel sounds + Extremities: no edema, no cyanosis  The results of significant diagnostics  from this hospitalization (including imaging, microbiology, ancillary and laboratory) are listed below for reference.   Imaging studies: CT ABDOMEN PELVIS WO CONTRAST Result Date: 05/12/2024 CLINICAL DATA:  Nausea.  Melena.  Abnormal weight loss. EXAM: CT ABDOMEN AND PELVIS WITHOUT CONTRAST TECHNIQUE: Multidetector CT imaging of the abdomen and pelvis was performed following the standard protocol without IV contrast. RADIATION DOSE REDUCTION: This exam was performed according to the departmental dose-optimization program which includes automated exposure control, adjustment of the mA and/or kV according to patient size and/or use of iterative reconstruction technique. COMPARISON:  CT scan abdomen and pelvis from 01/18/2024. FINDINGS: Lower chest: The lung bases are clear. No pleural effusion. The heart is normal in size. No pericardial effusion. Hepatobiliary: The liver is normal in size. Non-cirrhotic configuration. No suspicious mass. No intrahepatic or extrahepatic bile duct dilation. Gallbladder is surgically absent. Pancreas: Unremarkable. No pancreatic ductal dilatation or surrounding inflammatory changes. Spleen: Within normal limits. No focal lesion. Adrenals/Urinary Tract: Unremarkable right adrenal gland. There is a stable well-circumscribed left adrenal nodule with central dystrophic calcifications, favored to represent an adenoma. No suspicious renal mass within the limitations of this unenhanced exam. No nephroureterolithiasis or obstructive uropathy. Unremarkable urinary bladder. Stomach/Bowel: No disproportionate dilation of the small or large bowel loops. No evidence of abnormal bowel wall thickening or inflammatory changes. The appendix was not visualized; however there is no acute inflammatory process in the right lower quadrant. Vascular/Lymphatic: Redemonstration of diffuse fat stranding mainly in the central pelvis, which is essentially similar to the prior study. There is resolution of  previously noted ascites. No abscess or loculated collection. No pneumoperitoneum. No abdominal or pelvic lymphadenopathy, by size criteria. No aneurysmal dilation of the major abdominal arteries. There are moderate peripheral atherosclerotic vascular calcifications of the aorta and its major branches. Reproductive: The uterus is surgically absent. No large adnexal mass. Other: There is small fat containing periumbilical hernia. There are soft tissue density areas in the anterior abdominal wall subcutaneous tissue, most likely due to medication injections. Musculoskeletal: No suspicious osseous lesions. There are moderate - marked multilevel degenerative changes in the visualized spine. Redemonstration of mild compression deformities of T11 and L3 vertebrae, similar to the prior study. No significant retropulsion or spinal canal compromise. IMPRESSION: 1. No acute inflammatory process identified within the abdomen or pelvis. 2. Redemonstration of diffuse fat stranding mainly in the central pelvis, which is essentially similar to the prior study. There is resolution of previously noted ascites. No abscess or loculated collection. No  pneumoperitoneum. 3. Multiple other nonacute observations, as described above. Aortic Atherosclerosis (ICD10-I70.0). Electronically Signed   By: Ree Molt M.D.   On: 05/12/2024 13:06    Labs: Basic Metabolic Panel: Recent Labs  Lab 05/13/24 0914 05/13/24 1349 05/13/24 2336 05/14/24 0454 05/14/24 1054 05/15/24 0434 05/16/24 0420  NA 122* 125* 128* 132* 128* 133* 128*  K 5.3* 4.8  --  5.3*  --  5.3* 4.9  CL 81* 85*  --  94*  --  97* 91*  CO2 30 29  --  28  --  28 27  GLUCOSE 575* 231*  --  241*  --  184* 314*  BUN 68* 69*  --  49*  --  30* 22  CREATININE 2.01* 1.85*  --  1.34*  --  1.19* 1.18*  CALCIUM  10.1 10.4*  --  9.8  --  10.2 9.8  MG  --  2.2  --   --   --   --   --    CBC: Recent Labs  Lab 05/13/24 0914 05/14/24 0454 05/15/24 0434 05/16/24 0420   WBC 7.9 6.0 4.4 4.4  HGB 11.6* 10.5* 10.7* 10.7*  HCT 34.9* 31.7* 32.2* 33.2*  MCV 80.2 81.1 80.5 81.6  PLT 191 152 164 164   Microbiology: Results for orders placed or performed during the hospital encounter of 05/13/24  Resp panel by RT-PCR (RSV, Flu A&B, Covid) Anterior Nasal Swab     Status: None   Collection Time: 05/13/24  7:25 PM   Specimen: Anterior Nasal Swab  Result Value Ref Range Status   SARS Coronavirus 2 by RT PCR NEGATIVE NEGATIVE Final    Comment: (NOTE) SARS-CoV-2 target nucleic acids are NOT DETECTED.  The SARS-CoV-2 RNA is generally detectable in upper respiratory specimens during the acute phase of infection. The lowest concentration of SARS-CoV-2 viral copies this assay can detect is 138 copies/mL. A negative result does not preclude SARS-Cov-2 infection and should not be used as the sole basis for treatment or other patient management decisions. A negative result may occur with  improper specimen collection/handling, submission of specimen other than nasopharyngeal swab, presence of viral mutation(s) within the areas targeted by this assay, and inadequate number of viral copies(<138 copies/mL). A negative result must be combined with clinical observations, patient history, and epidemiological information. The expected result is Negative.  Fact Sheet for Patients:  bloggercourse.com  Fact Sheet for Healthcare Providers:  seriousbroker.it  This test is no t yet approved or cleared by the [place]  FDA and  has been authorized for detection and/or diagnosis of SARS-CoV-2 by FDA under an Emergency Use Authorization (EUA). This EUA will remain  in effect (meaning this test can be used) for the duration of the COVID-19 declaration under Section 564(b)(1) of the Act, 21 U.S.C.section 360bbb-3(b)(1), unless the authorization is terminated  or revoked sooner.       Influenza A by PCR NEGATIVE NEGATIVE  Final   Influenza B by PCR NEGATIVE NEGATIVE Final    Comment: (NOTE) The Xpert Xpress SARS-CoV-2/FLU/RSV plus assay is intended as an aid in the diagnosis of influenza from Nasopharyngeal swab specimens and should not be used as a sole basis for treatment. Nasal washings and aspirates are unacceptable for Xpert Xpress SARS-CoV-2/FLU/RSV testing.  Fact Sheet for Patients: bloggercourse.com  Fact Sheet for Healthcare Providers: seriousbroker.it  This test is not yet approved or cleared by the [place]  FDA and has been authorized for detection and/or diagnosis of SARS-CoV-2 by FDA under  an Emergency Use Authorization (EUA). This EUA will remain in effect (meaning this test can be used) for the duration of the COVID-19 declaration under Section 564(b)(1) of the Act, 21 U.S.C. section 360bbb-3(b)(1), unless the authorization is terminated or revoked.     Resp Syncytial Virus by PCR NEGATIVE NEGATIVE Final    Comment: (NOTE) Fact Sheet for Patients: bloggercourse.com  Fact Sheet for Healthcare Providers: seriousbroker.it  This test is not yet approved or cleared by the [place]  FDA and has been authorized for detection and/or diagnosis of SARS-CoV-2 by FDA under an Emergency Use Authorization (EUA). This EUA will remain in effect (meaning this test can be used) for the duration of the COVID-19 declaration under Section 564(b)(1) of the Act, 21 U.S.C. section 360bbb-3(b)(1), unless the authorization is terminated or revoked.  Performed at [facility], [address] [place], [place] [address]   Respiratory (~20 pathogens) panel by PCR     Status: Abnormal   Collection Time: 05/15/24  8:33 AM   Specimen: Nasopharyngeal Swab; Respiratory  Result Value Ref Range Status   Adenovirus NOT DETECTED NOT DETECTED Final   Coronavirus 229E NOT DETECTED NOT DETECTED  Final    Comment: (NOTE) The Coronavirus on the Respiratory Panel, DOES NOT test for the novel  Coronavirus (2019 nCoV)    Coronavirus HKU1 NOT DETECTED NOT DETECTED Final   Coronavirus NL63 NOT DETECTED NOT DETECTED Final   Coronavirus OC43 NOT DETECTED NOT DETECTED Final   Metapneumovirus DETECTED (A) NOT DETECTED Final   Rhinovirus / Enterovirus NOT DETECTED NOT DETECTED Final   Influenza A NOT DETECTED NOT DETECTED Final   Influenza B NOT DETECTED NOT DETECTED Final   Parainfluenza Virus 1 NOT DETECTED NOT DETECTED Final   Parainfluenza Virus 2 NOT DETECTED NOT DETECTED Final   Parainfluenza Virus 3 NOT DETECTED NOT DETECTED Final   Parainfluenza Virus 4 NOT DETECTED NOT DETECTED Final   Respiratory Syncytial Virus NOT DETECTED NOT DETECTED Final   Bordetella pertussis NOT DETECTED NOT DETECTED Final   Bordetella Parapertussis NOT DETECTED NOT DETECTED Final   Chlamydophila pneumoniae NOT DETECTED NOT DETECTED Final   Mycoplasma pneumoniae NOT DETECTED NOT DETECTED Final    Comment: Performed at [facility], [address], [place], [place] [address]    Time coordinating discharge: Over 30 minutes  Rhonda LITTIE Piety, MD  [facility] 05/16/2024, 12:44 PM  "

## 2024-05-16 NOTE — Plan of Care (Signed)

## 2024-05-16 NOTE — TOC Transition Note (Signed)
 Transition of Care [facility]) - Discharge Note   Patient Details  Name: Rhonda Tyler MRN: [id number] Date of Birth: 18-Jul-1940  [facility]) CM/SW Contact:  Lauraine JAYSON Carpen, LCSW Phone Number: 05/16/2024, 1:14 PM   Clinical Narrative:   Patient has orders to discharge home today. Readmission prevention screen complete. CSW met with patient. Caregiver at bedside. CSW introduced role and explained that discharge planning would be discussed. PCP is Layman Piety, MD. Caregiver drives her to appointments. [facility]acy [facility] [address]. Caregiver reports sometimes having difficulty affording one of her diabetes medications. Patient lives home alone. [facility] is aware she is discharging today. Patient has a RW, BSC, and shower chair at home. No further concerns. Caregiver will transport her home today. CSW signing off.  Final next level of care: Home w Home Health Services Barriers to Discharge: Barriers Resolved   Patient Goals and CMS Choice     Choice offered to / list presented to : Patient      Discharge Placement                Patient to be transferred to facility by: Caregiver   Patient and family notified of of transfer: 05/16/24  Discharge Plan and Services Additional resources added to the After Visit Summary for                            [facility] Arranged: PT [facility] Agency: [facility] Date HH Agency Contacted: 05/16/24   Representative spoke with at [facility] Agency: [place]  Social Drivers of Health (SDOH) Interventions SDOH Screenings   Food Insecurity: No Food Insecurity (05/14/2024)  Housing: Low Risk (05/14/2024)  Transportation Needs: No Transportation Needs (05/14/2024)  Utilities: Not At Risk (05/14/2024)  Financial Resource Strain: Low Risk  (03/29/2024)   Received from [facility]  Social Connections: Moderately Isolated (05/14/2024)  Tobacco Use: Medium Risk (05/12/2024)   Received from [facility]     Readmission Risk Interventions    05/16/2024    1:14 PM 09/15/2022   10:21 AM  Readmission Risk Prevention Plan  Transportation Screening Complete Complete  PCP or Specialist Appt within 3-5 Days Complete Complete  HRI or Home Care Consult Complete Complete  Social Work Consult for Recovery Care Planning/Counseling Complete Complete  Palliative Care Screening Not Applicable Complete  Medication Review [facility]) Complete Complete

## 2024-05-16 NOTE — Progress Notes (Incomplete)
 " PROGRESS NOTE Rhonda Tyler    DOB: 1940-12-23, 84 y.o.  FMW:[id number]    Code Status: Full Code   DOA: 05/13/2024   LOS: 0  Brief hospital course  Rhonda Tyler is a 84 y.o. female with a PMH significant for hypertension, hyperlipidemia, type 2 diabetes, CKD stage 3b, CHF (EF 65% in 2024),hepatic steatosis, OSA. They presented with ongoing intolerance of PO and weight loss.   ED course: VSS. CBC without leukocytosis, hemoglobin  11.6. CMP showed significant hyponatremia, mild hyperkalemia and significant hyperglycemia along with significant AKI.  Lipase was mildly elevated at 53.   05/16/2024 -nausea present this am. Underwent EGD  Assessment & Plan  Principal Problem:   Failure to thrive in adult Active Problems:   Iron  deficiency anemia   Uncontrolled type 2 diabetes mellitus with hyperglycemia, with long-term current use of insulin  (HCC)   Essential hypertension   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)   CKD stage 3b, GFR 30-44 ml/min (HCC)   DDD (degenerative disc disease), lumbosacral   Hyperlipidemia due to type 2 diabetes mellitus (HCC)   OSA on CPAP   Chronic diastolic CHF (congestive heart failure) (HCC)   GERD without esophagitis  nausea, dark stools and decreased appetite- present 1 year GI evaluated with EGD today and found Gastric polyps, duodenal ulcer. Bx taken for H pylori - will assess further for gastroparesis  - continue PPI - GI was consulted.  - SLP eval - continue regular diet per patient request - antiemetics PRN  Cough, congestion, respiratory distress- was on 2L and now weaned to room air RVP revealed positive MPV - supportive care  Hyponatremia: multifactorial in the setting of severe hyperglycemia and poor oral intake.  Sodium corrects to 131 today in setting of hyperglycemia - BMP am   Hyperkalemia- received insulin , calcium  gluconate, and patiromer  Giving lokelma  today - BMP am   AKI: Likely prerenal in the  setting of poor oral intake.  - has resolved to baseline. Cr 1.19  T2DM with hyperglycemia on presentation.  Not in DKA.   - continue SSI    Hypertension: Holding home medicine given AKI.  Will continue to monitor blood pressure and can use IV medicines as needed.   GERD: Continue home PPI.   CHF: Holding home diuretic given significant hyponatremia.   Iron  deficiency anemia: holding her iron  supplementation currently.  If no other cause for her GI symptoms, iron  supplementation can be considered as an etiology.  She follows with oncology for this.   Cognitive Impairment: Seen by neurology. On aricept. Will continue.    MDD: continue home zoloft .    OSA: per caregiver not using CPAP. Will continue here given PCP notes.  Body mass index is 22.42 kg/m.  VTE ppx: heparin  injection 5,000 Units Start: 05/13/24 2200  Diet:     Diet   Diet regular Room service appropriate? Yes; Fluid consistency: Thin   Consultants: GI  Subjective 05/16/2024    Pt reports feeling nausea today. Also complains of nasal congestion and cough. Significant rhinorrhea.  Objective  Blood pressure (!) 122/92, pulse 75, temperature 98.8 F (37.1 C), resp. rate 18, height 5' 3 (1.6 m), weight 64.8 kg, SpO2 100%.  Intake/Output Summary (Last 24 hours) at 05/16/2024 0737 Last data filed at 05/15/2024 1926 Gross per 24 hour  Intake 520 ml  Output 400 ml  Net 120 ml   Filed Weights   05/13/24 2020 05/15/24 0500 05/16/24 0500  Weight: 64.8 kg 57.5  kg 57.4 kg    Physical Exam:  General: awake, alert, NAD HEENT: atraumatic, clear conjunctiva, anicteric sclera, MMM, hearing grossly normal Respiratory: normal respiratory effort. Cardiovascular: extremities well perfused, quick capillary refill, normal S1/S2, RRR, no JVD, murmurs Gastrointestinal: soft, NT, ND Nervous: A&O x3. no gross focal neurologic deficits, normal speech Extremities: moves all equally, no edema, normal tone Skin: dry, intact, normal  temperature, normal color. No rashes, lesions or ulcers on exposed skin Psychiatry: normal mood, congruent affect  Labs   I have personally reviewed the following labs and imaging studies CBC    Component Value Date/Time   WBC 4.4 05/16/2024 0420   RBC 4.07 05/16/2024 0420   HGB 10.7 (L) 05/16/2024 0420   HGB 11.1 (L) 04/26/2023 1107   HCT 33.2 (L) 05/16/2024 0420   PLT 164 05/16/2024 0420   PLT 165 04/26/2023 1107   MCV 81.6 05/16/2024 0420   MCH 26.3 05/16/2024 0420   MCHC 32.2 05/16/2024 0420   RDW 14.5 05/16/2024 0420   LYMPHSABS 1.0 01/22/2024 0703   MONOABS 0.4 01/22/2024 0703   EOSABS 0.2 01/22/2024 0703   BASOSABS 0.0 01/22/2024 0703      Latest Ref Rng & Units 05/16/2024    4:20 AM 05/15/2024    4:34 AM 05/14/2024   10:54 AM  BMP  Glucose 70 - 99 mg/dL 685  815    BUN 8 - 23 mg/dL 22  30    Creatinine 9.55 - 1.00 mg/dL 8.81  8.80    Sodium 864 - 145 mmol/L 128  133  128   Potassium 3.5 - 5.1 mmol/L 4.9  5.3    Chloride 98 - 111 mmol/L 91  97    CO2 22 - 32 mmol/L 27  28    Calcium  8.9 - 10.3 mg/dL 9.8  89.7      No results found.  Disposition Plan & Communication  Patient status: Observation  Admitted From: Home Planned disposition location: Home Anticipated discharge date: TBD pending clinical course  Family Communication: none at bedside     Author: Marien LITTIE Piety, DO [facility] 05/16/2024, 7:37 AM   Available by Epic secure chat 7AM-7PM. If 7PM-7AM, please contact night-coverage.  [facility] contact information found on [email or web address]  "

## 2024-05-16 NOTE — Progress Notes (Signed)
 "    Rogelia Copping, MD [facility]   [address], [address] [place], [place] [address] Phone: [phone] Fax : [phone]   Subjective: The patient reports that she is feeling well.  She had an upper endoscopy with a duodenal erosion seen but no blockage/obstruction was seen.  Biopsies of the antrum showed minimal chronic gastritis without any H. pylori.  Patient is inquiring when she can go home.   Objective: Vital signs in last 24 hours: Vitals:   05/16/24 0500 05/16/24 0815 05/16/24 1025 05/16/24 1132  BP:  (!) 145/64  (!) 141/67  Pulse:  (!) 109  100  Resp:  16 (!) 24 18  Temp:  98.5 F (36.9 C)  98.6 F (37 C)  TempSrc:      SpO2:  98%  100%  Weight: 57.4 kg     Height:       Weight change: -0.1 kg  Intake/Output Summary (Last 24 hours) at 05/16/2024 1252 Last data filed at 05/16/2024 9072 Gross per 24 hour  Intake 640 ml  Output --  Net 640 ml     Exam: General: The patient is alert and orientated sitting up in bed in no apparent distress   Lab Results: @LABTEST2 @ Micro Results: Recent Results (from the past 240 hours)  Resp panel by RT-PCR (RSV, Flu A&B, Covid) Anterior Nasal Swab     Status: None   Collection Time: 05/13/24  7:25 PM   Specimen: Anterior Nasal Swab  Result Value Ref Range Status   SARS Coronavirus 2 by RT PCR NEGATIVE NEGATIVE Final    Comment: (NOTE) SARS-CoV-2 target nucleic acids are NOT DETECTED.  The SARS-CoV-2 RNA is generally detectable in upper respiratory specimens during the acute phase of infection. The lowest concentration of SARS-CoV-2 viral copies this assay can detect is 138 copies/mL. A negative result does not preclude SARS-Cov-2 infection and should not be used as the sole basis for treatment or other patient management decisions. A negative result may occur with  improper specimen collection/handling, submission of specimen other than nasopharyngeal swab, presence of viral mutation(s) within the areas targeted by this  assay, and inadequate number of viral copies(<138 copies/mL). A negative result must be combined with clinical observations, patient history, and epidemiological information. The expected result is Negative.  Fact Sheet for Patients:  bloggercourse.com  Fact Sheet for Healthcare Providers:  seriousbroker.it  This test is no t yet approved or cleared by the [place]  FDA and  has been authorized for detection and/or diagnosis of SARS-CoV-2 by FDA under an Emergency Use Authorization (EUA). This EUA will remain  in effect (meaning this test can be used) for the duration of the COVID-19 declaration under Section 564(b)(1) of the Act, 21 U.S.C.section 360bbb-3(b)(1), unless the authorization is terminated  or revoked sooner.       Influenza A by PCR NEGATIVE NEGATIVE Final   Influenza B by PCR NEGATIVE NEGATIVE Final    Comment: (NOTE) The Xpert Xpress SARS-CoV-2/FLU/RSV plus assay is intended as an aid in the diagnosis of influenza from Nasopharyngeal swab specimens and should not be used as a sole basis for treatment. Nasal washings and aspirates are unacceptable for Xpert Xpress SARS-CoV-2/FLU/RSV testing.  Fact Sheet for Patients: bloggercourse.com  Fact Sheet for Healthcare Providers: seriousbroker.it  This test is not yet approved or cleared by the [place]  FDA and has been authorized for detection and/or diagnosis of SARS-CoV-2 by FDA under an Emergency Use Authorization (EUA). This EUA will remain in effect (meaning  this test can be used) for the duration of the COVID-19 declaration under Section 564(b)(1) of the Act, 21 U.S.C. section 360bbb-3(b)(1), unless the authorization is terminated or revoked.     Resp Syncytial Virus by PCR NEGATIVE NEGATIVE Final    Comment: (NOTE) Fact Sheet for Patients: bloggercourse.com  Fact Sheet for  Healthcare Providers: seriousbroker.it  This test is not yet approved or cleared by the [place]  FDA and has been authorized for detection and/or diagnosis of SARS-CoV-2 by FDA under an Emergency Use Authorization (EUA). This EUA will remain in effect (meaning this test can be used) for the duration of the COVID-19 declaration under Section 564(b)(1) of the Act, 21 U.S.C. section 360bbb-3(b)(1), unless the authorization is terminated or revoked.  Performed at [facility], [address] [place], [place] [address]   Respiratory (~20 pathogens) panel by PCR     Status: Abnormal   Collection Time: 05/15/24  8:33 AM   Specimen: Nasopharyngeal Swab; Respiratory  Result Value Ref Range Status   Adenovirus NOT DETECTED NOT DETECTED Final   Coronavirus 229E NOT DETECTED NOT DETECTED Final    Comment: (NOTE) The Coronavirus on the Respiratory Panel, DOES NOT test for the novel  Coronavirus (2019 nCoV)    Coronavirus HKU1 NOT DETECTED NOT DETECTED Final   Coronavirus NL63 NOT DETECTED NOT DETECTED Final   Coronavirus OC43 NOT DETECTED NOT DETECTED Final   Metapneumovirus DETECTED (A) NOT DETECTED Final   Rhinovirus / Enterovirus NOT DETECTED NOT DETECTED Final   Influenza A NOT DETECTED NOT DETECTED Final   Influenza B NOT DETECTED NOT DETECTED Final   Parainfluenza Virus 1 NOT DETECTED NOT DETECTED Final   Parainfluenza Virus 2 NOT DETECTED NOT DETECTED Final   Parainfluenza Virus 3 NOT DETECTED NOT DETECTED Final   Parainfluenza Virus 4 NOT DETECTED NOT DETECTED Final   Respiratory Syncytial Virus NOT DETECTED NOT DETECTED Final   Bordetella pertussis NOT DETECTED NOT DETECTED Final   Bordetella Parapertussis NOT DETECTED NOT DETECTED Final   Chlamydophila pneumoniae NOT DETECTED NOT DETECTED Final   Mycoplasma pneumoniae NOT DETECTED NOT DETECTED Final    Comment: Performed at [facility], [address] [place], [place] [address]    Studies/Results: No results found. Medications: I have reviewed the patient's current medications. Scheduled Meds:  heparin   5,000 Units Subcutaneous Q8H   insulin  aspart  0-15 Units Subcutaneous TID WC   insulin  glargine  10 Units Subcutaneous Daily   lisinopril   5 mg Oral Daily   sertraline   100 mg Oral Daily   sodium chloride  flush  3 mL Intravenous Q12H   Continuous Infusions: PRN Meds:.acetaminophen  **OR** acetaminophen , guaiFENesin -dextromethorphan , ondansetron  **OR** ondansetron  (ZOFRAN ) IV, mouth rinse, phenol, sodium chloride    Assessment: Principal Problem:   Failure to thrive in adult Active Problems:   Iron  deficiency anemia   Essential hypertension   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)   CKD stage 3b, GFR 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  It was suggested  that gastroparesis may be at play here and a gastric emptying study can be considered although I am not sure they do them as in patients.  The patient's biopsies of the stomach showed reactive gastropathy with minimal chronic gastritis without any H. pylori seen. This patient does not need anything further as an inpatient from a GI point of view.  I will sign off.  Please call if any further GI concerns or questions.  We would like to thank you for the opportunity to participate in the care of Rhonda Tyler.    LOS: 0 days   Rogelia Copping, MD.FACG 05/16/2024, 12:52 PM Pager [phone] 7am-5pm  Check AMION  for 5pm -7am coverage and on weekends  "

## 2024-05-16 NOTE — Evaluation (Signed)
 Clinical/Bedside Swallow Evaluation Patient Details  Name: Rhonda Tyler MRN: [id number] Date of Birth: 03-27-1941  Today's Date: 05/16/2024 Time: SLP Start Time (ACUTE ONLY): 1100 SLP Stop Time (ACUTE ONLY): 1200 SLP Time Calculation (min) (ACUTE ONLY): 60 min  Past Medical History:  Past Medical History:  Diagnosis Date   Anginal pain    Arthritis    B12 deficiency anemia    Cervical disc disease    Cervical disc disease    Chest pain    Cholecystolithiasis    Chronic kidney disease    Cirrhosis of liver (HCC) 11/2019   Colon polyp    Coronary artery disease    Cutaneous sarcoidosis (HCC)    Diabetes mellitus without complication (HCC)    Pt takes insulin    Diabetic neuropathy (HCC)    GERD (gastroesophageal reflux disease)    Headache    migaines in the past   Hemorrhoid    History of diabetic neuropathy    Hypercalcemia    Hyperlipemia    Hypertension    IBS (irritable bowel syndrome)    Iron  deficiency anemia    Myocardial infarction (HCC)    Pneumonia    Sleep apnea    NO CPAP   Past Surgical History:  Past Surgical History:  Procedure Laterality Date   ABDOMINAL HYSTERECTOMY     arthroscopic rotator cuff repair     BACK SURGERY     BIOPSY OF SKIN SUBCUTANEOUS TISSUE AND/OR MUCOUS MEMBRANE  05/15/2024   Procedure: BIOPSY, GI;  Surgeon: Jinny Carmine, MD;  Location: [facility] ENDOSCOPY;  Service: Endoscopy;;   CARDIAC CATHETERIZATION     CATARACT EXTRACTION W/PHACO Right 01/26/2022   Procedure: CATARACT EXTRACTION PHACO AND INTRAOCULAR LENS PLACEMENT (IOC) RIGHT DIABETIC;  Surgeon: Myrna Adine Anes, MD;  Location: [facility];  Service: Ophthalmology;  Laterality: Right;  2.90 0:29.3   CATARACT EXTRACTION W/PHACO Left 02/09/2022   Procedure: CATARACT EXTRACTION PHACO AND INTRAOCULAR LENS PLACEMENT (IOC) LEFT DIABETIC 3.54 00:32.3;  Surgeon: Myrna Adine Anes, MD;  Location: [facility];  Service: Ophthalmology;  Laterality: Left;  Diabetic    CHOLECYSTECTOMY     COLONOSCOPY     COLONOSCOPY WITH PROPOFOL  N/A 11/29/2017   Procedure: COLONOSCOPY WITH PROPOFOL ;  Surgeon: Gaylyn Gladis PENNER, MD;  Location: [facility] END[facility]COPY;  Service: Endoscopy;  Laterality: N/A;   CORONARY ANGIOPLASTY     PTCA and stent of RCA   CORONARY ARTERY BYPASS GRAFT     ESOPHAGOGASTRODUODENOSCOPY     ESOPHAGOGASTRODUODENOSCOPY N/A 05/15/2024   Procedure: EGD (ESOPHAGOGASTRODUODENOSCOPY);  Surgeon: Jinny Carmine, MD;  Location: [facility] ENDOSCOPY;  Service: Endoscopy;  Laterality: N/A;   ESOPHAGOGASTRODUODENOSCOPY (EGD) WITH PROPOFOL  N/A 11/29/2017   Procedure: ESOPHAGOGASTRODUODENOSCOPY (EGD) WITH PROPOFOL ;  Surgeon: Gaylyn Gladis PENNER, MD;  Location: [facility] ENDOSCOPY;  Service: Endoscopy;  Laterality: N/A;   ESOPHAGOGASTRODUODENOSCOPY (EGD) WITH PROPOFOL  N/A 03/07/2018   Procedure: ESOPHAGOGASTRODUODENOSCOPY (EGD) WITH PROPOFOL ;  Surgeon: Gaylyn Gladis PENNER, MD;  Location: [facility] ENDOSCOPY;  Service: Endoscopy;  Laterality: N/A;   ESOPHAGOGASTRODUODENOSCOPY (EGD) WITH PROPOFOL  N/A 08/16/2019   Procedure: ESOPHAGOGASTRODUODENOSCOPY (EGD) WITH PROPOFOL ;  Surgeon: Toledo, Ladell POUR, MD;  Location: [facility] ENDOSCOPY;  Service: Gastroenterology;  Laterality: N/A;   EXCISION OF ADNEXAL MASS     JOINT REPLACEMENT  04/2021   left total knee replacement   OOPHORECTOMY     TOTAL KNEE REVISION Left 05/07/2021   Procedure: TOTAL KNEE REVISION;  Surgeon: Mardee Lynwood SQUIBB, MD;  Location: [facility] ORS;  Service: Orthopedics;  Laterality: Left;   vesicular vaginal  fistula     HPI:  Pt is a 84 y.o. 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  She was seen by her PCP yesterday and I reviewed this note for patient reported having GI symptoms  for 1 year including nausea, dark stools and decreased appetite.  This resulted in patient losing around 30 pounds in the last 3 months per PCP notes.  She also had a complaint of URI symptoms and was given Zithromax .  Stat imaging was pursued that did not show any acute findings but given the patient was having persistent symptoms came to the ED. On arrival to the ED patient was noted to be HDS stable.  Lab work obtained.  CBC without leukocytosis, hemoglobin improved from baseline which is likely secondary to hemoconcentration.  CMP showed significant hyponatremia, mild hyperkalemia and significant hyperglycemia along with significant AKI.     CT Abd. this admit: Lower chest: The lung bases are clear. No pleural effusion.  Pt has been evaluated by GI this admit d/t Chronic issues ongoing: an upper endoscopy with a duodenal erosion seen but no blockage/obstruction was seen.  Biopsies of the antrum showed minimal chronic gastritis without any H. pylori.. Small hiatal hernia noted also.     Assessment / Plan / Recommendation  Clinical Impression   Pt seen for BSE today. Pt awake, verbal and engaged easily w/ this SLP and her Caregiver. Pt followed basic instructions w/ cue; fed self w/ setup. Pt and Caregiver had questions re: pt's insulin  which were passed along to NSG. Per chart, pt has a Baseline of Memory Impairment. Noted Missing Dentition and general Deconditioning. 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 Caregiver present and stated pt used to get out of the house more and do things. She endorsed general decline in recent months, including in pt's appetite/eating. On RA, afebrile. WBC WNL.  Pt appears to present w/ grossly functional oropharyngeal phase swallowing w/ No overt oropharyngeal phase dysphagia noted, No neuromuscular deficits noted. Pt consumed po trials w/ No overt, clinical s/s of aspiration during the  po trials.  Pt appears at reduced risk for aspiration following general aspiration precautions. 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 These factors can increase risk for dysphagia as well as decreased oral intake overall.   During po trials, pt consumed all consistencies w/ no overt coughing, decline in vocal quality, or change in respiratory presentation during/post trials. No decline in O2 sats. Oral phase appeared grossly [facility] w/ timely bolus management, mastication, and control of bolus propulsion for A-P transfer for swallowing. Oral clearing achieved w/ all trial consistencies -- moistened, soft foods given.  OM Exam appeared [facility] w/ no unilateral weakness noted. Noted Missing Dentition. Speech Clear. Pt fed self w/ full setup support and positioning upright in bed.   Recommend a Regular/mech soft consistency diet w/ well-Cut meats, moistened foods; Thin liquids -- monitor straw use, and pt should sit fully upright and drink slowly holding her own  Cup when drinking. Recommend general aspiration precautions including Reducing Distractions at meals(TV, talking), small bites/sips Slowly. Supervision for support as needed at meals. Pills WHOLE in Puree for safer, easier swallowing -- pt has done this w/ the Caregiver in the home w/ success. It was encouraged now and for D/C to the Caregiver for general safety.  Education given on Pills in Puree; food consistencies, prep, and easy to eat options; recommended frequent MINI meals to best support oral intake/medical conditions; general aspiration precautions to pt and Caregiver. No further skilled ST services indicated at this time. NSG to reconsult if any new needs arise. NSG updated, agreed. MD updated.  Recommend Dietician f/u for support. Precautions posted in room, chart.  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 Recommend general aspiration precautions including Reducing Distractions at meals(TV, talking), small bites/sips Slowly. Supervision for support as needed at meals.   Medication Administration: Whole meds with puree (for safer swallowing in general)    Other Recommendations Recommended Consults:  (Dietician f/u at D/C; Palliative Care for GOC discussion moving forward(chronic health issues)) Oral Care Recommendations: Oral care BID;Patient independent with oral care (support)     Swallow Evaluation Recommendations  See above   Assistance Recommended at Discharge  Intermittent at meals as need indicates  Functional Status Assessment Patient has not had a recent decline in their functional status  Frequency and Duration  (n/a)   (n/a)       Prognosis Prognosis for improved oropharyngeal function: Fair (-Good) Barriers to Reach Goals: Cognitive deficits;Motivation;Time post onset;Severity of deficits Barriers/Prognosis Comment: impact from 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  She was seen by her PCP yesterday and I reviewed this note for patient reported having GI symptoms for 1 year including nausea, dark stools and decreased appetite.  This resulted in patient losing around 30 pounds in the last 3 months per PCP notes.  She also had a complaint of URI symptoms and was given Zithromax .  Stat imaging was pursued that did not show any acute findings but given the patient was having persistent symptoms came to the ED. On arrival to the ED patient was noted to be HDS stable.  Lab work obtained.  CBC without leukocytosis, hemoglobin improved from baseline which is likely secondary to hemoconcentration.  CMP showed significant hyponatremia, mild hyperkalemia and significant hyperglycemia along with significant AKI.    CT Abd. this admit: Lower chest: The lung bases are clear. No pleural effusion.  Pt has been evaluated  by GI this admit d/t Chronic issues ongoing: an upper endoscopy with a duodenal erosion seen but no blockage/obstruction was seen.  Biopsies of the antrum showed minimal chronic gastritis without any H. pylori.. Small hiatal hernia noted also. Type of Study: Bedside Swallow Evaluation Previous Swallow Assessment: none Diet Prior to this Study: Regular;Thin liquids (Level 0) Temperature Spikes Noted: No (wbc 4.4) Respiratory Status: Room air History of Recent Intubation: No Behavior/Cognition: Alert;Cooperative;Pleasant mood;Distractible;Requires cueing (memory loss per chart) Oral Cavity Assessment: Within Functional Limits Oral Care Completed by SLP: Recent completion by staff Oral Cavity - Dentition: Poor condition;Missing dentition Vision: Functional for self-feeding Self-Feeding Abilities: Able to feed  self;Needs assist;Needs set up Patient Positioning: Upright in bed (FULL assist for upright sitting) Baseline Vocal Quality: Normal Volitional Cough: Strong Volitional Swallow: Able to elicit    Oral/Motor/Sensory Function Overall Oral Motor/Sensory Function: Within functional limits   Ice Chips Ice chips: Within functional limits Presentation: Spoon (fed; 1 trial)   Thin Liquid Thin Liquid: Within functional limits Presentation: Cup;Self Fed;Straw (~3 ozs total) Other Comments: water , juice    Nectar Thick Nectar Thick Liquid: Not tested   Honey Thick Honey Thick Liquid: Not tested   Puree Puree: Within functional limits Presentation: Self Fed;Spoon (9+ trials)   Solid     Solid: Within functional limits (grossly) Presentation: Self Fed (3+ trials - moistened)        Comer Portugal, MS, CCC-SLP Speech Language Pathologist Rehab Services; [facility] [place] [phone] (ascom) Cerina Leary 05/16/2024,5:19 PM

## 2024-05-16 NOTE — Plan of Care (Signed)
  Problem: Clinical Measurements: Goal: Respiratory complications will improve Outcome: Progressing   Problem: Activity: Goal: Risk for activity intolerance will decrease Outcome: Progressing   Problem: Pain Managment: Goal: General experience of comfort will improve and/or be controlled Outcome: Progressing

## 2024-05-16 NOTE — Discharge Instructions (Addendum)
 I'm glad that you are feeling better. You test positive for Metapneumovirus. I recommend you try to wear a mask when in company with others and distancing yourself as much as possible for the next couple days to help prevent spread.    Your EGD did not show a cause of your difficulties with eating.  Please follow the speech therapy recommendations for your diet.   Follow up with your PCP in 1-2 weeks. They can recheck your labs at that time. Your kidney function had decreased while you were dehydrated but has recovered well.  Please hold your torsemide  and spironolactone  until that follow up to give your kidneys some more time to recover.  Your other medications have stayed the same.

## 2024-05-16 NOTE — Anesthesia Postprocedure Evaluation (Signed)
"   Anesthesia Post Note  Patient: Rhonda Tyler  Procedure(s) Performed: EGD (ESOPHAGOGASTRODUODENOSCOPY) BIOPSY, GI  Patient location during evaluation: PACU Anesthesia Type: General Level of consciousness: awake Pain management: satisfactory to patient Vital Signs Assessment: post-procedure vital signs reviewed and stable Respiratory status: spontaneous breathing Cardiovascular status: stable Anesthetic complications: no   No notable events documented.   Last Vitals:  Vitals:   05/15/24 2035 05/16/24 0010  BP: (!) 150/69 109/71  Pulse: (!) 109 (!) 108  Resp: 20   Temp: 36.8 C 37 C  SpO2: 100% 100%    Last Pain:  Vitals:   05/16/24 0340  TempSrc:   PainSc: 0-No pain                 VAN STAVEREN,Rayden Scheper      "

## 2024-05-16 NOTE — Progress Notes (Signed)
 Physical Therapy Treatment Patient Details Name: Rhonda Tyler MRN: [id number] DOB: 14-Sep-1940 Today's Date: 05/16/2024   History of Present Illness Patient is a 84 year old female with failure to thrive. History of longstanding GI symptoms with nausea, poor oral intake, weight loss of 30 pounds. 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    PT Comments  Patient is agreeable to PT session. Her personal caregiver from home is at the bedside. The patient ambulated in the hallway with rolling walker, fatigue reported with no significant shortness of breath noted. Sp02 99-100% on room air. Education on energy conservation strategies for home setting. Recommend to continue PT to maximize independence and decrease caregiver burden.    If plan is discharge home, recommend the following: Assistance with cooking/housework;Assist for transportation;Help with stairs or ramp for entrance   Can travel by private vehicle        Equipment Recommendations  None recommended by PT    Recommendations for Other Services       Precautions / Restrictions Precautions Precautions: Fall Restrictions Weight Bearing Restrictions Per Provider Order: No     Mobility  Bed Mobility Overal bed mobility: Needs Assistance Bed Mobility: Supine to Sit, Sit to Supine     Supine to sit: Supervision Sit to supine: Supervision   General bed mobility comments: increased time required    Transfers Overall transfer level: Needs assistance Equipment used: Rolling walker (2 wheels) Transfers: Sit to/from Stand Sit to Stand: Contact guard assist           General transfer comment: CGA for standing from bed and from toilet. cues for technique for safety    Ambulation/Gait Ambulation/Gait assistance: Supervision Gait Distance (Feet): 90 Feet Assistive device: Rolling walker (2 wheels) Gait Pattern/deviations: Step-through pattern, Trunk flexed Gait velocity: decreased      General Gait Details: encourage continued use of rolling walker for safety and fall prevention. no significant shortness of breath noted, however patient does report fatigue with activity. education on energy conservation strateiges to use in home setting   Stairs             Wheelchair Mobility     Tilt Bed    Modified Rankin (Stroke Patients Only)       Balance Overall balance assessment: Mild deficits observed, not formally tested                                          Communication Communication Communication: No apparent difficulties  Cognition Arousal: Alert Behavior During Therapy: WFL for tasks assessed/performed   PT - Cognitive impairments: Memory                         Following commands: Intact      Cueing Cueing Techniques: Verbal cues  Exercises      General Comments General comments (skin integrity, edema, etc.): Sp02 99-100% on room air. of note, caregiver Addie in the room reports patient's purple robe is missing (searched the room without finding the robe and alerted the RN)      Pertinent Vitals/Pain Pain Assessment Pain Assessment: No/denies pain    Home Living                          Prior Function  PT Goals (current goals can now be found in the care plan section) Acute Rehab PT Goals Patient Stated Goal: home PT Goal Formulation: With patient Time For Goal Achievement: 05/28/24 Potential to Achieve Goals: Fair Progress towards PT goals: Progressing toward goals    Frequency    Min 2X/week      PT Plan      Co-evaluation              AM-PAC PT 6 Clicks Mobility   Outcome Measure  Help needed turning from your back to your side while in a flat bed without using bedrails?: None Help needed moving from lying on your back to sitting on the side of a flat bed without using bedrails?: A Little Help needed moving to and from a bed to a chair (including a  wheelchair)?: A Little Help needed standing up from a chair using your arms (e.g., wheelchair or bedside chair)?: A Little Help needed to walk in hospital room?: A Little Help needed climbing 3-5 steps with a railing? : A Little 6 Click Score: 19    End of Session   Activity Tolerance: Patient tolerated treatment well Patient left: in bed;with call bell/phone within reach;with bed alarm set (personal caregiver in the room) Nurse Communication: Mobility status PT Visit Diagnosis: Muscle weakness (generalized) (M62.81);Unsteadiness on feet (R26.81)     Time: 9050-8990 PT Time Calculation (min) (ACUTE ONLY): 20 min  Charges:    $Therapeutic Activity: 8-22 mins PT General Charges $$ ACUTE PT VISIT: 1 Visit                     Randine Essex, PT, MPT    Randine LULLA Essex 05/16/2024, 10:16 AM

## 2024-05-16 NOTE — Inpatient Diabetes Management (Addendum)
 Inpatient Diabetes Program Recommendations  AACE/ADA: New Consensus Statement on Inpatient Glycemic Control  Target Ranges:  Prepandial:   less than 140 mg/dL      Peak postprandial:   less than 180 mg/dL (1-2 hours)      Critically ill patients:  140 - 180 mg/dL    Latest Reference Range & Units 05/15/24 09:15 05/15/24 12:37 05/15/24 16:48 05/15/24 20:34 05/16/24 08:11  Glucose-Capillary 70 - 99 mg/dL 764 (H) 743 (H) 879 (H) 387 (H) 299 (H)    Latest Reference Range & Units 01/19/24 06:30 05/14/24 04:54  Hemoglobin A1C 4.8 - 5.6 % 11.5 (H) 12.2 (H)   Review of Glycemic Control  Diabetes history: DM2 Outpatient Diabetes medications: 70/30 20-25 units BID (with breakfast and supper), Regular 20-25 units daily at lunch Current orders for Inpatient glycemic control: Lantus  10 units daily, Novolog  0-15 units TID with meals, Novolog  0-5 units QHS   Inpatient Diabetes Program Recommendations:     Insulin : Noted Lantus  increased from 5 to 10 units daily today.  Please consider adding Novolog  0-5 units QHS. If post prandial glucose is consistently over 180 mg/dl, please consider adding Novolog  6 units TID with meals for meal coverage if patient eats at least 50% of meals and     HbgA1C:  A1C 12.2% on 05/14/24 indicating an average glucose of 303 mg/dl over the past 2-3 months. Last A1C was 11.5% on 01/19/24 and patient was seen by inpatient diabetes coordinator on 01/19/24 and 01/21/24 during last hospital admission.  Addendum 05/17/23@12 :45-Spoke with patient and a caregiver at bedside about diabetes and home regimen for diabetes control. Caregiver reports that she is with patient 5 days a week but no one is with her over the weekend. Caregiver reports that patient is taking 70/30 20-25 units BID with breakfast and supper and Regular 20-25 units with lunch.  Patient reports buying the 70/30 and Regular insulin  over the counter from [facility] and she is taking those insulins because they are more affordable  for her.  Patient reports being followed by PCP for diabetes management. Patient reports that when the caregiver is there, she gets her insulin  consistently but usually over the weekend she forgets to take the insulin .  Caregiver reports that patient's glucose is usually in the 300's on Monday morning due to not taking the insulin  over the weekend.  Discussed A1C results (12.2% on 05/14/24) and explained that current A1C indicates an average glucose of 303 mg/dl over the past 2-3 months. Discussed glucose and A1C goals. Discussed importance of checking CBGs and maintaining good CBG control to prevent long-term and short-term complications. Explained how hyperglycemia leads to damage within blood vessels which lead to the common complications seen with uncontrolled diabetes. Stressed to the patient the importance of improving glycemic control to prevent further complications from uncontrolled diabetes. Discussed possibly talking with PCP about referring her to Endocrinologist for assistance with getting DM under better control.  Also discussed that perhaps using an ultra long acting insulin  once a day along with short acting insulin  with meals may help as well to keep glucose better controlled especially if patient forgets to take the insulin  over the weekend.  Had outpatient [facility] pharmacy check to see if [place] insulin  is covered but it is not covered under patient's insurance.  Patient's caregiver states she will talk with patient's son to see if family could assist patient over the weekend to ensure she is taking the insulin  on Saturday and Sunday.  Encouraged patient to allow her  family to help her over the weekend with reminding her about the insulin .  Patient verbalized understanding of information discussed and reports no further questions at this time related to diabetes.  Thanks, Earnie Gainer, RN, MSN, CDCES Diabetes Coordinator Inpatient Diabetes Program [phone] (Team Pager from 8am to 5pm)

## 2024-05-16 NOTE — TOC Initial Note (Signed)
 Transition of Care [facility]) - Initial/Assessment Note    Patient Details  Name: Rhonda Tyler MRN: [id number] Date of Birth: 1940/12/01  [facility]) CM/SW Contact:    Lauraine JAYSON Carpen, LCSW Phone Number: 05/16/2024, 11:27 AM  Clinical Narrative:  Per Patient Cort, patient is active with [facility]. Liaison confirmed patient is receiving PT. Will continue to follow progress and facilitate return home once stable.                Expected Discharge Plan: Home w Home Health Services Barriers to Discharge: Continued Medical Work up   Patient Goals and CMS Choice            Expected Discharge Plan and Services       Living arrangements for the past 2 months: Single Family Home                           HH Arranged: PT HH Agency: [facility] Date HH Agency Contacted: 05/16/24   Representative spoke with at [facility] Agency: Leita  Prior Living Arrangements/Services Living arrangements for the past 2 months: Single Family Home Lives with:: Self Patient language and need for interpreter reviewed:: Yes        Need for Family Participation in Patient Care: Yes (Comment) Care giver support system in place?: Yes (comment) Current home services: Home PT Criminal Activity/Legal Involvement Pertinent to Current Situation/Hospitalization: No - Comment as needed  Activities of Daily Living   ADL Screening (condition at time of admission) Is the patient deaf or have difficulty hearing?: Yes Does the patient have difficulty seeing, even when wearing glasses/contacts?: No Does the patient have difficulty concentrating, remembering, or making decisions?: No  Permission Sought/Granted                  Emotional Assessment       Orientation: : Oriented to Self, Oriented to Place, Oriented to  Time, Oriented to Situation Alcohol / Substance Use: Not Applicable Psych Involvement: No (comment)  Admission diagnosis:  Hyponatremia  [E87.1] Failure to thrive in adult [R62.7] Acute kidney injury superimposed on chronic kidney disease [N17.9, N18.9] Patient Active Problem List   Diagnosis Date Noted   Failure to thrive in adult 05/13/2024   Sepsis due to undetermined organism (HCC) 01/19/2024   Uncontrolled type 2 diabetes mellitus with hyperglycemia, with long-term current use of insulin  (HCC) 01/19/2024   Depression 01/19/2024   Dyslipidemia 01/19/2024   Peripheral neuropathy 01/19/2024   GERD without esophagitis 01/19/2024   Acute colitis 01/18/2024   Edema of right lower extremity 12/29/2022   Muscle twitching 11/02/2022   Acute metabolic encephalopathy 11/02/2022   Chronic diastolic CHF (congestive heart failure) (HCC) 10/30/2022   Ground-level fall 10/29/2022   Humerus fracture 10/29/2022   Leukocytosis 10/29/2022   Pneumonia 09/13/2022   Elevated CK 09/13/2022   Generalized weakness 09/13/2022   Hyponatremia 10/14/2021   Hyperlipemia    Type II diabetes mellitus with renal manifestations (HCC)    SVT (supraventricular tachycardia)    Elevated troponin    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   Periprosthetic fracture around internal prosthetic left knee joint 05/05/2021   Idiopathic gout, unspecified site 11/20/2020   Itching 11/20/2020   Anemia in chronic kidney disease 10/16/2020   Hyperparathyroidism due to renal insufficiency 10/16/2020   Proteinuria, unspecified 10/16/2020   CKD stage 3b, GFR 30-44 ml/min (HCC) 06/03/2020   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) 01/22/2020   Arthritis of left shoulder region 01/06/2020   Iron  deficiency 01/06/2020   Chest pain 01/05/2020   Acute kidney injury superimposed on CKD    Essential hypertension    OSA on CPAP 09/05/2019   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   Hypercalcemia 02/13/2019   Iron  deficiency anemia 06/30/2018   Acute respiratory failure with hypoxia (HCC) 03/23/2018   Hyperlipidemia due to type 2  diabetes mellitus (HCC) 04/09/2014   Type 2 diabetes mellitus with diabetic chronic kidney disease (HCC) 03/04/2014   Adrenal nodule 11/28/2013   B12 deficiency 11/28/2013   CAD (coronary artery disease), autologous vein bypass graft 08/05/2013   DDD (degenerative disc disease), lumbosacral 08/05/2013   Headache 08/05/2013   PCP:  Lenon Layman ORN, MD Pharmacy:   [facility]: [place] [address]onda [address]), [place] - [address] [place]) [place] [address] Phone: [phone] Fax: [phone]  [facility] [address] [place] [address] Phone: [phone] Fax: [phone]     Social Drivers of Health (SDOH) Social History: SDOH Screenings   Food Insecurity: No Food Insecurity (05/14/2024)  Housing: Low Risk (05/14/2024)  Transportation Needs: No Transportation Needs (05/14/2024)  Utilities: Not At Risk (05/14/2024)  Financial Resource Strain: Low Risk  (03/29/2024)   Received from [facility]  Social Connections: Moderately Isolated (05/14/2024)  Tobacco Use: Medium Risk (05/12/2024)   Received from [facility]   SDOH Interventions:     Readmission Risk Interventions    09/15/2022   10:21 AM  Readmission Risk Prevention Plan  Transportation Screening Complete  PCP or Specialist Appt within 3-5 Days Complete  HRI or Home Care Consult Complete  Social Work Consult for Recovery Care Planning/Counseling Complete  Palliative Care Screening Complete  Medication Review Oceanographer) Complete

## 2024-05-22 ENCOUNTER — Ambulatory Visit: Payer: Self-pay | Admitting: Gastroenterology
